# Patient Record
Sex: Male | Born: 1935 | Race: White | Hispanic: No | Marital: Married | Smoking: Former smoker
Health system: Southern US, Community
[De-identification: ages and names within clinical notes are randomized; demographics above are authoritative.]

## PROBLEM LIST (undated history)

## (undated) ENCOUNTER — Emergency Department (HOSPITAL_COMMUNITY): Payer: Medicare Other | Source: Home / Self Care

## (undated) DIAGNOSIS — Z86010 Personal history of colonic polyps: Secondary | ICD-10-CM

## (undated) DIAGNOSIS — E119 Type 2 diabetes mellitus without complications: Secondary | ICD-10-CM

## (undated) DIAGNOSIS — Z860101 Personal history of adenomatous and serrated colon polyps: Secondary | ICD-10-CM

## (undated) DIAGNOSIS — Z955 Presence of coronary angioplasty implant and graft: Secondary | ICD-10-CM

## (undated) DIAGNOSIS — R0600 Dyspnea, unspecified: Secondary | ICD-10-CM

## (undated) DIAGNOSIS — F32A Depression, unspecified: Secondary | ICD-10-CM

## (undated) DIAGNOSIS — H9193 Unspecified hearing loss, bilateral: Secondary | ICD-10-CM

## (undated) DIAGNOSIS — M1990 Unspecified osteoarthritis, unspecified site: Secondary | ICD-10-CM

## (undated) DIAGNOSIS — I10 Essential (primary) hypertension: Secondary | ICD-10-CM

## (undated) DIAGNOSIS — L723 Sebaceous cyst: Secondary | ICD-10-CM

## (undated) DIAGNOSIS — R2681 Unsteadiness on feet: Secondary | ICD-10-CM

## (undated) DIAGNOSIS — I440 Atrioventricular block, first degree: Secondary | ICD-10-CM

## (undated) DIAGNOSIS — G20A1 Parkinson's disease without dyskinesia, without mention of fluctuations: Secondary | ICD-10-CM

## (undated) DIAGNOSIS — G20 Parkinson's disease: Secondary | ICD-10-CM

## (undated) DIAGNOSIS — K449 Diaphragmatic hernia without obstruction or gangrene: Secondary | ICD-10-CM

## (undated) DIAGNOSIS — E785 Hyperlipidemia, unspecified: Secondary | ICD-10-CM

## (undated) DIAGNOSIS — K219 Gastro-esophageal reflux disease without esophagitis: Secondary | ICD-10-CM

## (undated) DIAGNOSIS — R479 Unspecified speech disturbances: Secondary | ICD-10-CM

## (undated) DIAGNOSIS — K5730 Diverticulosis of large intestine without perforation or abscess without bleeding: Secondary | ICD-10-CM

## (undated) DIAGNOSIS — R233 Spontaneous ecchymoses: Secondary | ICD-10-CM

## (undated) DIAGNOSIS — I252 Old myocardial infarction: Secondary | ICD-10-CM

## (undated) DIAGNOSIS — N400 Enlarged prostate without lower urinary tract symptoms: Secondary | ICD-10-CM

## (undated) DIAGNOSIS — I2510 Atherosclerotic heart disease of native coronary artery without angina pectoris: Secondary | ICD-10-CM

## (undated) DIAGNOSIS — I452 Bifascicular block: Secondary | ICD-10-CM

## (undated) DIAGNOSIS — N183 Chronic kidney disease, stage 3 unspecified: Secondary | ICD-10-CM

## (undated) DIAGNOSIS — F329 Major depressive disorder, single episode, unspecified: Secondary | ICD-10-CM

## (undated) DIAGNOSIS — Z973 Presence of spectacles and contact lenses: Secondary | ICD-10-CM

## (undated) DIAGNOSIS — R238 Other skin changes: Secondary | ICD-10-CM

## (undated) DIAGNOSIS — J449 Chronic obstructive pulmonary disease, unspecified: Secondary | ICD-10-CM

## (undated) HISTORY — DX: Gastro-esophageal reflux disease without esophagitis: K21.9

## (undated) HISTORY — PX: OTHER SURGICAL HISTORY: SHX169

## (undated) HISTORY — DX: Hyperlipidemia, unspecified: E78.5

## (undated) HISTORY — PX: CORONARY ANGIOPLASTY WITH STENT PLACEMENT: SHX49

## (undated) HISTORY — PX: CARDIOVASCULAR STRESS TEST: SHX262

## (undated) HISTORY — DX: Atherosclerotic heart disease of native coronary artery without angina pectoris: I25.10

## (undated) HISTORY — DX: Essential (primary) hypertension: I10

## (undated) HISTORY — PX: TONSILLECTOMY: SUR1361

## (undated) HISTORY — PX: CARDIAC CATHETERIZATION: SHX172

## (undated) HISTORY — PX: CATARACT EXTRACTION W/ INTRAOCULAR LENS IMPLANT: SHX1309

## (undated) HISTORY — PX: ESOPHAGOGASTRODUODENOSCOPY ENDOSCOPY: SHX5814

## (undated) HISTORY — DX: Major depressive disorder, single episode, unspecified: F32.9

## (undated) HISTORY — DX: Depression, unspecified: F32.A

## (undated) HISTORY — PX: TRANSTHORACIC ECHOCARDIOGRAM: SHX275

---

## 2000-02-10 ENCOUNTER — Encounter: Payer: Self-pay | Admitting: Emergency Medicine

## 2000-02-10 ENCOUNTER — Inpatient Hospital Stay (HOSPITAL_COMMUNITY): Admission: EM | Admit: 2000-02-10 | Discharge: 2000-02-13 | Payer: Self-pay | Admitting: Emergency Medicine

## 2000-03-01 DIAGNOSIS — I252 Old myocardial infarction: Secondary | ICD-10-CM

## 2000-03-01 HISTORY — DX: Old myocardial infarction: I25.2

## 2001-04-13 ENCOUNTER — Inpatient Hospital Stay (HOSPITAL_COMMUNITY): Admission: EM | Admit: 2001-04-13 | Discharge: 2001-04-15 | Payer: Self-pay

## 2001-04-15 ENCOUNTER — Encounter: Payer: Self-pay | Admitting: Interventional Cardiology

## 2004-08-18 ENCOUNTER — Encounter: Admission: RE | Admit: 2004-08-18 | Discharge: 2004-08-18 | Payer: Self-pay | Admitting: Internal Medicine

## 2005-05-10 ENCOUNTER — Inpatient Hospital Stay (HOSPITAL_BASED_OUTPATIENT_CLINIC_OR_DEPARTMENT_OTHER): Admission: RE | Admit: 2005-05-10 | Discharge: 2005-05-10 | Payer: Self-pay | Admitting: Interventional Cardiology

## 2010-03-06 ENCOUNTER — Encounter: Admission: RE | Admit: 2010-03-06 | Discharge: 2010-03-06 | Payer: Self-pay | Admitting: Internal Medicine

## 2010-06-30 ENCOUNTER — Ambulatory Visit (HOSPITAL_COMMUNITY)
Admission: RE | Admit: 2010-06-30 | Discharge: 2010-06-30 | Payer: Self-pay | Source: Home / Self Care | Attending: General Surgery | Admitting: General Surgery

## 2010-06-30 LAB — DIFFERENTIAL
Basophils Absolute: 0.1 10*3/uL (ref 0.0–0.1)
Basophils Relative: 1 % (ref 0–1)
Eosinophils Absolute: 0.3 10*3/uL (ref 0.0–0.7)
Eosinophils Relative: 4 % (ref 0–5)
Lymphocytes Relative: 24 % (ref 12–46)
Lymphs Abs: 2 10*3/uL (ref 0.7–4.0)
Monocytes Absolute: 0.9 10*3/uL (ref 0.1–1.0)
Monocytes Relative: 11 % (ref 3–12)
Neutro Abs: 4.9 10*3/uL (ref 1.7–7.7)
Neutrophils Relative %: 60 % (ref 43–77)

## 2010-06-30 LAB — CBC
HCT: 44.8 % (ref 39.0–52.0)
MCHC: 33.5 g/dL (ref 30.0–36.0)
RDW: 13.6 % (ref 11.5–15.5)

## 2010-06-30 LAB — SURGICAL PCR SCREEN
MRSA, PCR: NEGATIVE
Staphylococcus aureus: NEGATIVE

## 2010-06-30 LAB — COMPREHENSIVE METABOLIC PANEL
AST: 38 U/L — ABNORMAL HIGH (ref 0–37)
CO2: 27 mEq/L (ref 19–32)
Calcium: 10.2 mg/dL (ref 8.4–10.5)
Creatinine, Ser: 1.84 mg/dL — ABNORMAL HIGH (ref 0.4–1.5)
GFR calc Af Amer: 43 mL/min — ABNORMAL LOW (ref >60)
GFR calc non Af Amer: 36 mL/min — ABNORMAL LOW (ref >60)
Glucose, Bld: 270 mg/dL — ABNORMAL HIGH (ref 70–99)

## 2010-06-30 LAB — URINALYSIS, ROUTINE W REFLEX MICROSCOPIC
Hgb urine dipstick: NEGATIVE
Protein, ur: NEGATIVE mg/dL
Urobilinogen, UA: 0.2 mg/dL (ref 0.0–1.0)

## 2010-07-01 LAB — GLUCOSE, CAPILLARY
Glucose-Capillary: 129 mg/dL — ABNORMAL HIGH (ref 70–99)
Glucose-Capillary: 125 mg/dL — ABNORMAL HIGH (ref 70–99)

## 2010-07-03 NOTE — Op Note (Signed)
NAME:  Ronald Benitez, Ronald Benitez NO.:  [id number]  MEDICAL RECORD NO.:  [id number]          PATIENT TYPE:  AMB  LOCATION:  SDS                          FACILITY:  [facility]  PHYSICIAN:  Angelia Mould. Derrell Lolling, M.D.DATE OF BIRTH:  02/08/1929  DATE OF PROCEDURE:  06/30/2010 DATE OF DISCHARGE:                              OPERATIVE REPORT   PREOPERATIVE DIAGNOSIS:  Right inguinal hernia.  POSTOPERATIVE DIAGNOSIS:  Right inguinal hernia.  OPERATION PERFORMED:  Open repair right inguinal hernia with UltraPro mesh (Lichtenstein repair).  SURGEON:  Angelia Mould. Derrell Lolling, M.D.  OPERATIVE INDICATION:  This is a 77 year old Caucasian gentleman with coronary artery disease, non-insulin-dependent diabetes, hypertension, chronic renal insufficiency, and depressive disorder.  He has had a bulge in his right groin for 8-10 months.  This is getting bigger and it has become uncomfortable.  It has never been incarcerated.  On exam, hehas a right inguinal hernia that is moderate in size, but reducible. There are no other hernias or scrotal masses noted.  It was his desire to go ahead and have this repaired at this time and he was brought to the operating room electively.  OPERATIVE TECHNIQUE:  Following the induction of general endotracheal anesthesia, the patient's lower abdomen, groin and genitalia were prepped and draped in sterile fashion.  Surgical time-out was held identifying correct patient, correct procedure, and correct site. Intravenous antibiotics were given.  0.5% Marcaine with epinephrine was used as a local infiltration anesthetic.  A slightly oblique transverse incision was made in the right groin overlying the right inguinal canal. Dissection was carried down through the subcutaneous tissue, exposing the aponeurosis of the external oblique.  The external oblique was incised in the direction of its fibers, opening up the external inguinal ring.  The external oblique was dissected  off the underlying tissues and self-retaining retractors were placed.  The cord structures were dissected, mobilized, and encircled with Penrose drain.  A small sensory nerve was noted to be intermittently associated with the cord and it was traced back to its emergence from the muscles lateral to the internal ring, clamped, divided, and ligated with 2-0 silk tie to prevent neuroma formation.  The nerve medially was debrided away.  Cremasteric muscle fibers were skeletonized off the cord structures.  I found that he had a large indirect hernia sac.  This was carefully dissected away from the cord structures all the way back to the level of the internal ring.  The indirect sac was then opened and inspected.  It was empty.  I was able to pass my finger through the indirect sac into the peritoneal cavity. There was no evidence of femoral hernia.  There was no ascites.  The indirect sac was then twisted and suture-ligated at the level of the internal ring with a suture ligature of 2-0 silk.  The redundant sac was excised and discarded.  There was also a lipoma associated with the cord, which was dissected off and away.  At this point in time, the dissection appeared complete.  Hemostasis was good and achieved electrocautery.  The wound was irrigated with saline.  The hernia was repaired and reinforced with  an onlay graft of UltraPro mesh.  A 3 inch x 6 inch piece of UltraPro mesh was brought to the operative field and trimmed at the corners to accommodate the wound.  The mesh was sutured in place with running sutures of 2-0 Prolene and an interrupted mattress sutures of 2-0 Prolene.  The mesh was sutured so as to generously overlap the fascia at the pubic tubercle, then along the inguinal ligament inferiorly until it was well lateral to the internal ring. Medially and superiorly and superior medially, I placed several interrupted mattress sutures of 2-0 Prolene.  The mesh was  incised laterally so as to wrap around the cord structures at the internal ring. The tails of the mesh were overlapped laterally.  Both the superior and inferior sutures were tied down and completed.  I put one more mattress suture of 2-0 Prolene in the mesh just lateral to the internal ring to tighten up the repair.  This provided a very secure repair both medial and lateral to the internal ring, but allowed an adequate fingertip opening to the cord structures.  The wound was irrigated with saline. Hemostasis was excellent.  The external oblique was closed with running suture of 2-0 Vicryl placing the cord structures deep to the external oblique.  Scarpa fascia was closed with 3-0 Vicryl sutures and the skin closed with a running subcuticular suture of 4-0 Monocryl and Dermabond. Ice pack was placed.  The patient was taken to the recovery room in stable condition.  Estimated blood loss was about 10 mL.  Complications none.  Sponge, needle, and instrument counts were correct.     Angelia Mould. Derrell Lolling, M.D.     HMI/MEDQ  D:  06/30/2010  T:  06/30/2010  Job:  [id number]  cc:   Theressa Millard, M.D. Lyn Records, M.D. Courtney Paris, M.D.  Electronically Signed by Claud Kelp M.D. on 07/01/2010 10:03:06 AM

## 2010-10-23 NOTE — H&P (Signed)
[place]. [facility]  Patient:    Ronald Benitez, Ronald Benitez Visit Number: [id number] MRN: [id number]          Service Type: MED Location: [phone] Attending Physician:  Lyn Records. Iii Dictated by:   Anselm Lis, N.P. Admit Date:  04/13/2001 Discharge Date: 04/15/2001                           History and Physical  DATE OF BIRTH:  January 25, 1929  PRIMARY CARE [place]:  Dr. Theressa Millard.  IMPRESSION:  (as dictated by Dr. Verdis Prime) 1. Stable, becoming unstable, angina pattern in a 77 year old gentleman.    History of dyslipidemia.  Known small vessel disease by most recent cardiac    catheterization, September 2001.  At that time the patient suffered an    acute myocardial infarction thought secondary to progression of disease    in the RV branch of the RCA.  At time of heart cath, stents that were    previously placed to the RCA, OM #2 (x 2) and proximal CFX were patent.    There was preserved LV function.  At this admission his EKG is nonacutely    ischemic and is essentially unchanged from EKG of June 2002.  The patient    is currently pain-free.  He is on aspirin and beta blocker.  His first set    of heart enzymes are normal. 2. History of depression/anxiety on Zoloft, Ativan, and desipramine. 3. History of dyslipidemia; on Zocor. 4. History of hypertension; metoprolol, hydrochlorothiazide, and Captopril. 5. Renal insufficiency, baseline creatinine as high as 2.0.  Serum creatinine    today 1.7. 6. Hypokalemia with serum potassium at 3.6. 7. Chronic right bundle branch block, left anterior hemiblock.  PLAN:  (As dictated by Dr. Verdis Prime) 1. Admit to telemetry, rule out MI protocol.  Serial cardiac enzymes daily,    EKG.  We would like to proceed with coronary angiography, but schedule may    not allow until Monday, April 17, 2001, and we are not sure if the    patient would agree to stay in hospital until then. 2. Supplement  potassium with 40 mEq of K-Dur. 3. Would need prehydration IV fluids/Mucomyst 10-12 hours prior to anticipated    heart cath because of history of renal insufficiency.  HISTORY OF PRESENT ILLNESS:  [name] is a very pleasant 77 year old gentleman with a history of stents RCA, OM-2 (x 2), proximal CFX, all patent on recent heart cath, September 2001.  He has chronic small vessel disease (70-80% OM-3, 100% of a side branch diagonal 1, 80% other side branch diagonal 1, 100% proximal mid PDA).  At time of cath, September 2001, it was noted there was progression of stenosis, acute marginal branch off RCA, which was thought to be the culprit for patients MI.  He had normal LV function with EF greater than 55%.  It was opted for medical management.  Since that time the patient has had episodic (about every other week) exertional angina symptoms relieved with nitroglycerin and rest.  Most recent episode prior to today was approximately one week ago.  Today while doing light yard work, he developed low substernal pressure/burning described as 8/10, without associated diaphoresis, nausea, or shortness of breath.  He felt hot.  He drove a quarter mile home and took a total of five sublingual nitroglycerin doses (combo of tabs and spray) with perhaps transient  improvement before finally relieved after approximately 55 minutes.  He came to the emergency room at his wifes insistence, and he is currently pain free. His EKG is nonischemic (chronic right bundle branch block).  First set of cardiac enzymes are negative.  PREVIOUS MEDICAL HISTORY: 1. Coronary atherosclerotic heart disease.    a. (September 2001) myocardial infarction with peak CK 419, MB fraction       of 64, troponin I of 1.53.  Cardiac catheterization revealing 100% PDA,       95% acute marginal branch of RCA (question culprit lesion), 100% branch       diagonal 1, high grade obstruction second branch of diagonal 1, 50% mid        LAD, 70-80% OM-3.  All lesions except for ____ marginal branch of       RCA were unchanged from prior heart catheterization in 1998.    b. (March 1998) Stent RCA.    c. (April 1997) Stent OM-2.    d. (November 1997) Stent proximal CFX, stent OM 2.    e. (November 1999) Cardiolite revealing EF of 73%, inferior basal ischemia       (small area) new compared to perfusion scan in June 1998. 2. History of hiatal hernia with prior EGD. 3. Depression/anxiety. 4. Chronic right bundle branch block. 5. History of renal insufficiency with creatinine as high as 2.0, April 1998. 6. Colonic polyps. 7. BPH.  PREVIOUS SURGICAL HISTORY:  Tonsillectomy.  ALLERGIES:  No known drug allergies.  Okay with seafood, shellfish, and with iodinated products.  MEDICATIONS: 1. Zoloft 50 mg p.o. q.a.m. 2. Metoprolol 50 mg one half b.i.d. 3. Hydrochlorothiazide 25 mg p.o. q.d. 4. Zocor 40 mg p.o. q.h.s. 5. Captopril 12.5 one half b.i.d. 6. Lorazepam 0.5 t.i.d. 7. Desipramine 50 mg two tabs q.h.s. 8. Ecotrin 325 mg p.o. q.d.  SOCIAL HISTORY/HABITS:  The patient is married for 51 years.  He has a daughter and three sons, alive and well.  Occasional cigar use.  ETOH: Negative.  FAMILY HISTORY:  Mother deceased at age 99, CAD.  Father deceased at age 92 of CAD.  Brother deceased in his 30s, encephalopathy.  He has two sisters.  REVIEW OF SYSTEMS:  As in HPI/previous medical history, otherwise wears glasses.  He is hard of hearing and needs hearing aids, but does not like to wear them.  Dentition okay.  Notes more frequent dysphagia, thought secondary to hiatal hernia and eating too fast and taking too large of bites.  He does have symptoms of GERD, but can tell the difference between that and anginal symptoms, although both described as burning.  She takes Pepcid p.r.n. for this.  Negative constipation, diarrhea, melena, no bright red blood PR. Negative dysuria or hematuria.  Arthritic type complaints  affecting his knees and the lower back.  Denies orthopnea, PND, or pedal edema.  Denies DOE.  PHYSICAL EXAMINATION: (As performed by Dr. Verdis Prime)   VITAL SIGNS:  Blood pressure 104/58, heart rate 69 and regular, temperature 97.6, respiratory rate 20, room air saturation 96%.  GENERAL:  In general he is a well-nourished, older gentleman in no apparent distress.  His wife is in attendance.  HEENT:  Brisk bilateral carotid upstroke without bruit.  NECK:  Without JVD, no thyromegaly.  CHEST:  Lung sounds clear with equal bilateral excursion.  Negative CPA tenderness.  CARDIAC:  Regular rate and rhythm without murmur, rub, or gallop.  Distant heart sounds.  ABDOMEN:  Soft, nondistended, normal active bowel sounds.  Negative abdominal aorta, renal or femoral bruit.  Nontender to applied pressure, no masses, no organomegaly appreciated.  EXTREMITIES:  Distal pulses intact, negative pedal edema.  NEUROLOGIC:  Cranial nerves 2-12 grossly intact, alert and oriented x 3.  GENITAL/RECTAL:  Exams deferred.  LABORATORY TESTS AND DATA:  His EKG revealed NSR with right bundle branch block, left axis deviation/left anterior hemiblock.  Question left atrial enlargement.  No changes compared with EKG, June 2002.  Sodium 136, K 3.6, chloride 101, CO2 28, BUN 27, creatinine 1.7, glucose 142, calcium 9.2.  LFTs within normal range.  CK 163, MB fraction 2.3, troponin I 0.03.  Hemoglobin 14.6, hematocrit of 42.7, WBC 6.2, platelets 193.  Protime 13.8. INR 1.1, PTT 34.  Chest x-ray was clear; azygous lobe. Dictated by:   Anselm Lis, N.P. Attending Physician:  Lyn Records. Iii DD:  04/13/01 TD:  04/14/01 Job: [id number]

## 2010-10-23 NOTE — Cardiovascular Report (Signed)
NAME:  Ronald Benitez, Ronald Benitez NO.:  [id number]   MEDICAL RECORD NO.:  [id number]          PATIENT TYPE:  OIB   LOCATION:  1965                         FACILITY:  [facility]   PHYSICIAN:  Lyn Records, M.D.   DATE OF BIRTH:  [birth year]   DATE OF PROCEDURE:  05/10/2005  DATE OF DISCHARGE:  05/10/2005                              CARDIAC CATHETERIZATION   INDICATION FOR STUDY:  Increased in intensity/severity of anginal discomfort  with discomfort lasting up to hours at times. The patient has prior history  of circumflex and right coronary stent remotely. Study is being done to  redefine coronary anatomy and help guide therapy in light of his  intensification of anginal symptoms.   The patient also has renal insufficiency and a history of hypertension. We  will do an abdominal aortogram to rule out renal artery stenosis in the  presence of mild renal insufficiency with creatinines in the 1.7 to 1.8  range.   PROCEDURE PERFORMED:  1.  Normal left heart catheterization.  2.  Selective coronary angiography.  3.  Left ventriculography.  4.  Abdominal aortography.   DESCRIPTION:  After informed consent, a 4-French sheath was placed in the  right femoral artery using a modified Seldinger technique. A 4-French A2  multipurpose catheter was then used for hemodynamic recordings, left  ventriculography by hand injection and selective right coronary angiography.  A 4-French #4 left Judkins catheter was used for left coronary angiography.  Left ventriculography was performed by hand injection with the multipurpose  catheter. Following coronary angiography, we then performed abdominal  aortography with a straight pigtail 4-French catheter using 40 cc of  contrast at 26 cc per second. No evidence of abdominal aortic aneurysm or  renal artery stenosis was noted.   Hemostasis was achieved with manual compression.   RESULTS:   HEMODYNAMIC DATA:  1.  Aortic pressure was 36/68.  2.   Left ventricular pressure 135/22.   ABDOMINAL AORTOGRAPHY:  No renal artery stenosis or evidence of abdominal  aortic aneurysm is noted.   LEFT VENTRICULOGRAPHY:  Left ventricle was upper limit of normal in size.  There is mild anterolateral hypokinesis. EF estimated to be 50%.   CORONARY ANGIOGRAPHY:  1.  Left main coronary is calcified noted by cine fluoroscopy. No left main      obstructive lesion was noted.  2.  The left anterior descending coronary: There is a large vessel calcified      proximally. No significant obstructive lesions were noted in the LAD      proper. The LAD wraps around the left ventricular apex. Up to 40-50%      narrowing is noted beyond the second diagonal. The first diagonal is      totally occluded proximally with faint minimal collaterals noted that      supplied the mid-anterolateral wall.  3.  The circumflex artery: Circumflex coronary artery essentially      trifurcates into a continuation circumflex and a large bifurcating first      obtuse marginal. First obtuse marginal that is dominant. The inferior      most branch  contains a Dan-Turkel-Ruben stent that is widely patent. The      stent was positioned within the bifurcation proximally and extends down      into proximal portion of this marginal branch. Beyond the stent, there      are moderate luminal irregularity noted and distally there is an      additional bifurcation. The more superior branch of the bifurcation      contains 70% mid-vessel stenosis at 95% distal stenosis. The ostium that      is involved by the stent in the bifurcation and probably contains      significant stenosis as well. Of the two branches of the first obtuse      marginal, the highly diseased branch was the smaller of the two. The      continuation of the circumflex is diffusely diseased with 80-90%      midvessel narrowing but very small distal territory.  4.  Right coronary:  Right coronary artery contains a stent in  the proximal      and mid-vessel. There is wide patency with up to 30-40% narrowing noted      in the very proximal region of the right coronary with in the proximal      margin of the stent. No high-grade obstruction was felt to be present.      The distal right coronary contains luminal irregularities and the PDA is      severely and diffusely diseased in its proximal and mid-segment with      TIMI grade III flow. The vessel was felt to be too small to intervene      upon. The acute marginal of the right coronary also contains a high-      grade obstruction and is also relatively small vessel.   CONCLUSION:  1.  Severe coronary artery disease with total occlusion of first diagonal,      high-grade obstruction in the acute marginal of the right coronary and      the posterior descending artery of the right coronary and high-grade      obstruction in the more superior branch of the first obtuse marginal      with wide patency of the second larger branch of the first obtuse      marginal. There is severe diffuse disease and continuation of the      circumflex that has a very limited distribution. The main portion of the      left anterior descending artery is widely patent as is the proximal, mid      and distal right coronary.  2.  Mild left ventricular dysfunction with mid anterior wall hypokinesis.  3.  No evidence for abdominal aortic aneurysm or renal artery stenosis.   PLAN:  Continue aggressive medical therapy. Hold Altace and diuretic until  renal function is rechecked in 48-72 hours and also hold Aleve. The patient  will be discharged later today after hydration.      Lyn Records, M.D.  Electronically Signed     HWS/MEDQ  D:  05/10/2005  T:  05/10/2005  Job:  [id number]   cc:   Theressa Millard, M.D.  Fax: [phone]   Georges Lynch. Darrelyn Hillock, M.D.  Fax: [phone]

## 2010-10-23 NOTE — Cardiovascular Report (Signed)
[place]. [facility]  Patient:    Ronald Benitez, Ronald Benitez                   MRN: [id number] Proc. Date: 02/11/00 Adm. Date:  [id number] Attending:  Lyn Records. Iii CC:         James C. Earl Gala, M.D.  Cardiac Catheterization Laboratory   Cardiac Catheterization  INDICATIONS:  Non-Q-wave myocardial infarction.  PROCEDURES PERFORMED: 1. Left heart catheterization. 2. Selective coronary angiography. 3. Left ventriculography.  DESCRIPTION OF PROCEDURE:  After informed consent, a 6 French sheath was started in the right femoral artery using a modified Seldinger technique.  A 6 French A2 multipurpose catheter was used for hemodynamic recordings, hand injection left ventriculography, and selective right coronary angiography.  A 6 French #4 left Judkins catheter was used for left coronary angiography.  The patient tolerated the procedure without complications.  Before we broke scrub I reviewed the patients old cineangiograms to make sure about the presence of any new lesions that could accounted for the patients presenting symptom.  RESULTS:  I:  HEMODYNAMIC DATA:     a. The aortic pressure was 168/84 mmHg.     b. Left ventricular pressure 171/22 mmHg.  II:  LEFT VENTRICULOGRAPHY:  The left ventricle is opacified by hand injection.  Overall contractility is normal.  Estimated ejection fraction is greater than 55%.  No obvious mitral regurgitation was noted.  III:  SELECTIVE CORONARY ANGIOGRAPHY:     a. Left main:  The left main coronary artery is widely patent.     b. Left anterior descending coronary artery:  The left anterior        descending coronary artery is a large vessel that wraps around the        left ventricular apex.  After the first septal perforator there is        an eccentric 40-50% stenosis.  In the mid vessel there is an area        of kinking that obstructs the vessel by 50%.  Both of these sites are        unchanged from previous  studies.  The first diagonal was a large        bifurcating system.  The medial most limb is totally occluded.  It has        chronically totally occluded since 1997 cardiac catheterization.  The        second more lateral branch contains a segmental 85% stenosis that does        not appear to be changed since the last coronary angiogram.     c. Circumflex artery:  The circumflex artery is a large vessel that gives        origin to three obtuse marginal branches.  The proximal vessel in the        distribution of the proximal stent is widely patent.  The first obtuse        marginal contains a segmental 50% stenosis at the site of previous        angioplasty.  The second obtuse marginal branch is widely patent at the        site of previous angioplasty.  The third obtuse marginal branch        contains an ostial 70% stenosis which is unchanged from the previous        study.  There is a Gianturco-Roubin stent in the second obtuse marginal  branch and there appears to be no evidence of in-stent re-stenosis.        The distal portion of the second obtuse marginal branch is widely        patent, although there are luminal irregularities noted.     d. Right coronary artery:  The right coronary artery is a large vessel        that gives origin to a small diffusely diseased PDA that fills        competitively by collaterals from the LAD.  The proximal and mid vessel         is stented and are widely patent.  Distal to the stents in the mid        vessel there is an eccentric 50% stenosis.  There is a bifurcating        relatively large acute marginal branch.  One of the branches after the        bifurcation contains 90-95% stenosis.  This lesion appears to be more        significant when compared to the previous study.  This vessel was        small, however, and is covered.  The ostium to this vessel is covered        by the stent in the mid right coronary and is not  accessible.  CONCLUSIONS: 1. Significant coronary atherosclerotic heart disease with high-grade    functional total occlusion of the posterior descending artery, high-grade    obstruction in a branch of the acute marginal branch of the right coronary,    total occlusion of a branch of the first diagonal with high-grade    obstruction in the second branch of the first diagonal.  A 50% stenosis    in the mid left anterior descending and a 70-80% stenosis in the third    obtuse marginal branch.  All of these sites of stenosis in the branch    vessels are not significantly changed from cardiac catheterization after    angioplasty in 1997 with the exception of the branch in the acute marginal,    which appears to have progressed and may be the culprit for the patients    presentation. 2. Normal left ventricular function.  PLAN:  Medical therapy. DD:  02/11/00 TD:  02/11/00 Job: [id number]

## 2010-10-23 NOTE — Discharge Summary (Signed)
[place]. [facility]  Patient:    Ronald, Benitez Visit Number: [id number] MRN: [id number]          Service Type: MED Location: [phone] Attending Physician:  Lyn Records. Iii Dictated by:   Anselm Lis, N.P. Admit Date:  04/13/2001 Discharge Date: 04/15/2001                             Discharge Summary  PRIMARY CARE PHYSICIAN:  Theressa Millard, M.D.  PROCEDURES:  Adenosine Cardiolite which was negative for ischemia.  Ejection fraction 64%.  Negative scar.  DISCHARGE DIAGNOSES: 1. Acute coronary syndrome with small enzyme leakage, troponin-I 0.26.  Likely    secondary to known small vessel disease.  Adenosine Cardiolite was negative    for ischemia, nor scar, with a good ejection fraction of 64%. 2. History of renal insufficiency with baseline creatinine as high as 2.0. Serum creatinine on admission was 1.7; 1.6 on 04/14/01. 3. History of hypertension on multi-drug regimen with adequate control. 4. History of depression/anxiety, on Zoloft, Ativan, and desipramine. 5. History of dyslipidemia; Zocor. 6. Hypokalemia with serum potassium of 3.6; supplemented. 7. Chronic right bundle branch block/left anterior hemiblock.  PLAN:  The patient is discharged home in stable condition without recurrence of chest pain during course of admission.  DISCHARGE MEDICATIONS: 1. Hydrochlorothiazide 25 mg p.o. q.d. 2. Nitroglycerin tablet or spray 0.4 mg up to 3 doses in 15 minutes. 3. Enteric-coated aspirin 325 mg one q.d. 4. Zoloft 15 mg q.d. 5. Metoprolol 50 mg 1/2 tab p.o. b.i.d. 6. Captopril 12.5 mg 1/2 tab b.i.d. 7. Lorazepam 0.5 mg t.i.d. 8. Desipramine 50 mg two tabs h.s. 9. Zocor 50 mg q.h.s.  ACTIVITY:  Take it easy until after seen by Dr. Katrinka Blazing in clinic.  DIET:  Low fat, low cholesterol, no added salt.  FOLLOWUP:  Dr. Verdis Prime on Monday, April 24, 2001, at 4 p.m.  HISTORY OF PRESENT ILLNESS:  Ronald Benitez is a very pleasant  77 year old gentleman with a history of dyslipidemia and known small vessel disease by most recent cardiac catheterization in September 2001.  At that time, the patient suffered an acute myocardial infarction, thought secondary to progression of disease in the RV branch of the right coronary artery.  At time of heart cath, stents that were previously placed to the right coronary artery, OM#2 (x2), and proximal CFX were patent.  There was preserved left ventricular function.  On this admission, the patients EKG is not acutely ischemic, and is essentially unchanged from EKG of June 2002.  At the time of emergency evaluation he was pain-free and remained so during the course of admission.  The first set of heart enzymes were negative.  He did develop a slight enzyme leak with a troponin-I of 0.26.  With positive troponins, Dr. Katrinka Blazing felt stress (Adenosine) Cardiolite would help determine if one of the proximal coronary arteries was responsible for presentation.  Dr. Katrinka Blazing felt it was possible that some of the smaller branches were culprit (in which case, medical therapy would be most appropriate).  Adenosine Cardiolite revealed no evidence of ischemia nor scar, with an ejection fraction of 64%.  Thus the patient was discharged with continuation of medical therapy.  LABORATORY DATA:  White blood cell count 6.2, hemoglobin 14.6, hematocrit 42.7, platelets 93.  Prothrombin time 13.8, INR 1.1, PTT 34.  Sodium 136, potassium 3.6; supplemented.  Potassium 4.0 on 04/14/01.  CO2  of 28, chloride 101, glucose 142; followup glucose of 114.  BUN 27, creatinine 1.7; followup BUN of 25, creatinine 1.6.  Calcium 9.2.  Liver function tests were within normal limits.  First CK of 164, MB fraction 2.3, troponin-I 0.03 seconds, CK 140, MB fraction 4.5, third CK of 135, MB fraction 4.2, troponin-I 0.26. Followup troponin-I was 0.13.  Adenosine Cardiolite from 04/15/01, revealed ejection fraction of 54% without  regional wall motion abnormality.  No scar nor inducible ischemia.  PAST MEDICAL HISTORY:  As above.  Details of coronary atherosclerotic heart disease:  In 9/01, cardiac catheterization revealing 100% posterior descending coronary artery, 95% acute marginal branch of right coronary artery, question culprit lesion, 100% branch diagonal one, high grade obstruction second branch of diagonal branch, 50% of mid left anterior descending artery, 70 to 80% OM3. All lesions except for the acute marginal branch of the right coronary artery were unchanged from prior heart catheterization in 1998.  The patient did have a myocardial infarction that admission with peak CK of 419, MB fraction 64, troponin-I of 1.53.  In 3/98, stent right coronary artery.  In 4/97, stent OM2.  In 11/97, stent proximal CFX, stent OM2.  In 11/99, Cardiolite revealing ejection fraction 73%, inferior basal ischemia (small area), new compared to perfusion scan in June 1998.  Colonic polyps.  Benign prostatic hypertrophy.  PAST SURGICAL HISTORY:  Tonsillectomy.  TOTAL TIME PREPARING THIS DISCHARGE:  Greater than 45 minutes, including writing out and reviewing discharge instructions with the patient, arranging followup office visit, and dictating this discharge summary. Dictated by:   Anselm Lis, N.P. Attending Physician:  Lyn Records. Iii DD:  05/01/01 TD:  05/01/01 Job: [id number]

## 2010-10-23 NOTE — Discharge Summary (Signed)
. [facility]  Patient:    Ronald Benitez, Ronald Benitez                   MRN: [id number] Adm. Date:  [id number] Disc. Date: [id number] Attending:  Lyn Records. Iii Dictator:   Anselm Lis, N.P. CC:         Winn Jock. Earl Gala, M.D.   Discharge Summary  PRIMARY CARE [place]:  Dr. Theressa Millard  DATE OF BIRTH:  10/03/1928  PROCEDURES: A. (February 11, 2000) Cardiac catheterization revealing significant coronary    atherosclerotic heart disease with 100% of the posterior descending artery,    high-grade obstruction in a branch of the acute marginal branch of the    right coronary artery, 100% of a branch of the first diagonal with    high-grade obstruction in the second branch of the first diagonal.  Also,    50% stenosis mid LAD, 70-80% stenosis in obtuse marginal #3.  All of these    sites not significantly changed from prior cardiac catheterization after    angioplasty in 1997 with the exception of the branch in the acute marginal    which appears to have progressed and may be culprit for patients    presentation.  Normal LV function with EF greater than 55%.  No obvious MR    noted.  Comment:  Before Dr. Katrinka Blazing broke scrub, he reviewed the patients    old cineangiograms to assess for the presence of any new lesions that could    have accounted for the patients presenting symptoms.  DISCHARGE DIAGNOSES/HOSPITAL COURSE:  #1 - CORONARY ARTERY DISEASE:  Ronald Benitez is a 77 year old gentleman with known history of CAD with multiple PTCA/stent interventions of the circumflex.  He was admitted with chest pain consistent with ischemic event. He was started on IV nitroglycerin, Integrilin, heparin and became pain-free shortly after admission.  There were no acute ischemic EKG changes.  He remained pain-free the night of admission and was taken to the CATH lab February 11, 2000 with the following results:  A. LV gram:  Within normal limits with  EF greater than 55%. B. Coronary angiography:  Left main without significant obstruction.  LAD with    with 50% LAD after diagonal #1, 85% (after bifurcation) diagonal #1 in one    branch then branch closed.  Patent proximal CX.  OM1 with 50%, OM2 okay,    OM3 70-85% (all CX/OM branches unchanged since 1997).  RCA with mid 50%    distal to stent.  Chronic 90% PDA.  RV branch 95% (question culprit).    Dr. Lonn Georgia impression was that there was small vessel disease involving    the PDA, RV branch, diagonal #1, and obtuse margin #3 similar to that seen    prior heart CATH 1997.  He suspected the right ventricular branch was the    culprit for the patients presenting symptoms.  IV Integrilin was continued    another 24 hours with discontinuation of IV heparin and nitrates.  Patient    remained pain-free and was discharged home February 13, 2000.  #2 - NON-Q WAVE MYOCARDIAL INFARCTION:  Peak CK of 419 with MB fraction 64.2 and troponin I of 1.53.  #3 - HISTORY OF DYSLIPIDEMIA:  On Zocor.  #4 - HISTORY OF DEPRESSION:  On Zoloft.  #5 - HISTORY OF RIGHT BUNDLE BRANCH BLOCK.  #6 - HISTORY OF RENAL INSUFFICIENCY WITH CREATININE OF 2.0 APRIL 1997: Creatinine 1.8  on this hospital admission.  Patient received Nephron protection from IVP dye exposure with 600 mg Mucomyst two doses prior to CATH as well as pre-CATH IV fluid hydration.  #7 - HISTORY OF HYPERTENSION:  Blood pressure within acceptable range during of this admission on current medical regimen.  DISCHARGE PLANS: 1. Patient discharged home in stable condition. 2. Discharge medications:    A. (NEW) Nitrolingual spray 0.4 mg per spray on or sublingual tongue p.r.n.       chest pain.    B. Captopril 12.5 mg one-half tab p.o. q.d.    C. Zoloft 50 mg p.o. q.d.    D. Zocor 40 mg p.o. q.d.    E. Enteric-coated aspirin 325 mg once daily.    F. Hydrochlorothiazide 25 mg once daily.    G. Metoprolol 50 mg one half q.d.    H. Desipramine 50  mg two tabs at bedtime.    I. Lorazepam 0.5 mg t.i.d. 3. Diet:  Low fat, low cholesterol. 4. Activity:  Patient instructed not to drive until following Wednesday. 5. Followup:  Dr. Verdis Prime; our clinic will call him with a date for    follow-up office visit.  LABORATORY TESTS AND DATA:  Serial cardiac enzymes were as follows:  First CK 172 with MB fraction 2.5 and troponin I of 0.05.  Second CK of 326 with MB fraction of 41 and troponin I of 0.67.  Third CK of 419 with MB fraction 64.5 and troponin I of 1.53.  Admission sodium 136, potassium 3.6, chloride 104, CO2 of 24, glucose 164, BUN 25, creatinine 1.8.  LFTs within normal range though AST very slightly elevated at 41.  Admission coagulopathies were within normal range.  CBC: Wbcs 7.3; 10.8 about time of discharge.  Hemoglobin 15.4; 14.2 at time of discharge.  Admission hematocrit of 44, platelets of 227.  Chest x-ray revealed bilateral atelectasis with a more focal airspace opacity at the left base which may represent early consolidation (pneumonia).  EKG revealed an old right bundle branch block with T wave abnormalities in lead 3 otherwise no significant change.  PAST MEDICAL HISTORY: 1. Coronary atherosclerotic heart disease:    A. (March 1998) Stent to the RCA.    B. (April 1997) Stent to the OM2.    C. (November 1997) Stent in the proximal CX, stent in OM2, PTCA.  Residual       100% of a branch in the diagonal #1, 80% of the other branch diagonal       #1, residual 60 to 70% diagonal #1, and 99% proximal to mid PDA.    D. (November 1999) Cardiolite revealed an ejection fraction of 73%.       Inferior basal ischemia (small area) new compared to perfusion scan in       June 1998. 2. History of hiatal hernia with prior EGD. 3. History of depression and anxiety. 4. Chronic right bundle branch block. 5. History of renal insufficiency with creatinine as high as 2.0 in    April 1998. 6. Colonic polyps. 7. BPH. 8. History  of tonsillectomy. DD:  03/10/00 TD:  03/11/00 Job: [id number]  [id number]

## 2010-10-23 NOTE — H&P (Signed)
[place]. [facility]  Patient:    Ronald Benitez, Ronald Benitez                   MRN: [id number] Adm. Date:  [id number] Attending:  Lyn Records. Iii Dictator:   Anselm Lis, N.P. CC:         Winn Jock. Earl Gala, M.D.   History and Physical  DATE OF BIRTH:  20-Jul-1928  HISTORY OF PRESENT ILLNESS:  Mr. Soulier is a very pleasant 77 year old with a known history of coronary artery disease, last cardiac catheterization in November 1997, at which time he had a stent placed in the proximal circumflex, stent in the OM-II, (restenosis in prior stent area), PTCA of OM-I.  The prior stent placed in March 1997, was patent.  The patient has known residual 99% proximal to mid-PDA, 60%-70% proximal diagonal, 100% of a side branch off of the diagonal-I, and 80% of the other side branch also of diagonal-I.  Normal LV function.  A patient with a history of hypertension and dyslipidemia. Follow-up Cardiolite in November 1999, revealed preserved LV function with an ejection fraction of 73%, inferobasal ischemia which was new compared to June 1998, Cardiolite.  At that time the decision was made for watchful waiting, given the small area of ischemic region and absence of clinical symptoms.  The patient has had a recent history of exertional chest tightness when he walks, relieved with rest.  This morning about 11:30 a.m. he developed a severe substernal chest pain/pressure, question episodic radiation to the left neck, with some back discomfort.  He denies associated shortness of breath, nausea, or diaphoresis.  He took two sublingual nitroglycerin without effect. The discomfort waxes and wanes in intensity.  He reported to [facility] where he was given one sublingual nitroglycerin with a drop in his systolic blood pressure to the 80s and associated diaphoresis.  No significant change in his pain.  Serial electrocardiograms showed the  development of T-wave abnormalities in lead III, but this was essentially unchanged from baseline.  He had the presence of a chronic right bundle branch block.  The patient was given a GI cocktail, four baby aspirin, started on IV nitrates, and IV heparin.  His chest pain did decrease to 1/10 on the pain scale.  He is ordered for IV Integrilin.  CARDIAC RISK FACTORS:  Known coronary artery disease, age, male sex, dyslipidemia, hypertension.  PAST MEDICAL HISTORY: 1. Coronary atherosclerotic heart disease:    a. In March 1998, stent to the RCA.    b. In April 1997, stent to the OM-II.    c. In November 1997, stent in proximal circumflex, stent in OM-II,       PTCA.  Residual 100% of a branch of the diagonal-I, 80% of the       other branch of diagonal-I, residual 60%-70% diagonal-I, a 99%       proximal to mid-PDA.    d. In November 1999, Cardiolyte revealing an ejection fraction of 73%.       Inferobasal ischemia (small area), new compared to perfusion scan       in June 1998. 2. History of hiatal hernia with prior EGD. 3. Depression/anxiety. 4. Chronic right bundle branch block. 5. History of renal insufficiency with creatinine as high as 2.0 in    April 1998. 6. Colonic polyps. 7. Benign prostatic hypertrophy.  PAST SURGICAL HISTORY:  Tonsillectomy.  ALLERGIES:  No known drug allergies.  Okay with sea food and shell fish.  CURRENT MEDICATIONS: 1. Captopril 12.5 mg 1/2 p.o. q.d. 2. Zoloft 50 mg p.o. q.d. 3. Zocor 40 mg p.o. q.d. 4. Enteric-coated aspirin 325 mg p.o. q.h.s. 5. Hydrochlorothiazide 25 mg p.o. q.d. 6. Metoprolol 50 mg 1/2 p.o. q.d. 7. Desipramine 50 mg two tablets q.h.s. 8. Lorazepam 0.5 mg p.o. t.i.d.  SOCIAL HISTORY/HABITS:  The patient is married for 50 years.  Has a daughter and three sons alive and well.  Occasional cigar use.  ETOH:  Negative.  FAMILY HISTORY:  Mother deceased at age 36, CAD.  Father deceased at age 27, CAD.  A brother deceased in  his 51s of heart disease.  He has two sisters.  REVIEW OF SYSTEMS:  As in the HPI and past medical history, otherwise essentially all systems reviewed and were benign.  PHYSICAL EXAMINATION:  VITAL SIGNS:  Blood pressure 128/74, heart rate 82 and regular, respirations 16, temperature 96.7 degrees.  GENERAL:  He is well-nourished older gentleman, in variable levels of distress, as his pain waxes and wanes.   The discomfort seems to sometimes improve with leaning forward, and slight improvement with belching.  HEENT/NECK:  Brisk bilateral carotid upstrokes, without bruit.  No significant jugular venous distention.  CHEST:  Lung sounds clear.  Equal bilateral excursion.  Negative CPA tenderness.  CARDIAC:  A regular rate and rhythm, without murmur, rub, or gallop.  Normal S1, S2.  ABDOMEN:  Soft, nondistended.  Normoactive bowel sounds.  Negative abdominal aortic, renal, or femoral bruits.  Nontender to palpation.  Negative masses or organomegaly appreciated.  EXTREMITIES:  With +2/4 bilateral radial, femoral, dorsalis pedis, posterior tibial pulses.  Negative pedal edema.  NEUROLOGIC:  Cranial nerves II-XII grossly intact.  Alert and oriented x 3.  GENITOURINARY:  Deferred.  RECTAL:  Deferred.  LABORATORY DATA:  The wbcs 7.3, hemoglobin 15.4, hematocrit 44, platelets 227.  Pro time 13.1, INR 1, PTT 28.  Sodium 136, K of 3.6, chloride 104, CO2 of 24, BUN 25, creatinine 1.8, glucose 164.  CPK 172, MB fraction 2.5, troponin I 0.05.  Chest x-ray revealed bibasilar atelectasis, focal opacity in the left base, question early pneumonia.  IMPRESSION: 1. Symptoms of unstable angina pectoris in a 77 year old male with known    history of coronary artery disease, with prior stent to obtuse marginal-II,    stent in the proximal circumflex, percutaneous transluminal coronary    angioplasty of obtuse marginal-I, and patent right coronary artery by    cardiac catheterization in  November 1997.  (Prior stent of right coronary     artery in March 1977.  The patient has known residual diagonal disease,    with 100% branch of the diagonal-I, 80% in another branch of diagonal-I,    and 60%-70% proximal diagonal.  At the time of the catheterization in    November 1997, he also showed a 99% proximal to mid-posterior descending    coronary artery.  Follow-up Cardiolite in November 1999, revealed an    ejection fraction of 73% with inferior basal ischemia, new compared to    perfusion scan in June 1998.  The patient was asymptomatic at the time    of that latest perfusion scan, and it was elected for watchful waiting.    At present, his discomfort waxes and wanes.  He is currently on intravenous    nitrates, intravenous heparin, and is status post four baby aspirin.  There    was no significant diminishing discomfort with sublingual  nitrates.  His    electrocardiogram revealed an old right bundle branch block with some    progressive T-wave abnormalities in lead III, otherwise no significant    change.  He is ordered for intravenous Integrilin.  His first CPK/MB and    troponin are negative. 2. History of hiatal hernia. 3. History of depression/anxiety, on chronic benzodiazepines and Zoloft. 4. History of dyslipidemia, on Zocor. 5. History of hypertension, good control on current medical regimen.  PLAN:  On admission he remains with pain.  We will plan on an early cardiac catheterization this afternoon.  If he becomes pain-free, we will initiate heart catheterization tomorrow morning.  Rule out myocardial infarction with serial cardiac enzymes, daily electrocardiograms. DD:  02/10/00 TD:  02/10/00 Job: [id number]

## 2011-11-19 ENCOUNTER — Other Ambulatory Visit: Payer: Self-pay | Admitting: Orthopaedic Surgery

## 2011-11-19 DIAGNOSIS — M542 Cervicalgia: Secondary | ICD-10-CM

## 2011-11-24 ENCOUNTER — Ambulatory Visit
Admission: RE | Admit: 2011-11-24 | Discharge: 2011-11-24 | Disposition: A | Discharge status: Home / Self Care | Payer: Medicare Other | Source: Ambulatory Visit | Attending: Orthopaedic Surgery | Admitting: Orthopaedic Surgery

## 2011-11-24 DIAGNOSIS — M542 Cervicalgia: Secondary | ICD-10-CM

## 2013-05-08 ENCOUNTER — Encounter: Payer: Self-pay | Admitting: Neurology

## 2013-05-08 ENCOUNTER — Ambulatory Visit (INDEPENDENT_AMBULATORY_CARE_PROVIDER_SITE_OTHER): Payer: Medicare Other | Admitting: Neurology

## 2013-05-08 VITALS — BP 142/62 | HR 48 | Temp 97.4°F | Resp 12 | Ht 71.0 in | Wt 182.6 lb

## 2013-05-08 DIAGNOSIS — F329 Major depressive disorder, single episode, unspecified: Secondary | ICD-10-CM | POA: Insufficient documentation

## 2013-05-08 DIAGNOSIS — E1142 Type 2 diabetes mellitus with diabetic polyneuropathy: Secondary | ICD-10-CM

## 2013-05-08 DIAGNOSIS — E1149 Type 2 diabetes mellitus with other diabetic neurological complication: Secondary | ICD-10-CM

## 2013-05-08 DIAGNOSIS — G20 Parkinson's disease: Secondary | ICD-10-CM

## 2013-05-08 MED ORDER — CARBIDOPA-LEVODOPA 25-100 MG PO TABS: 1.0000 | ORAL_TABLET | Freq: Three times a day (TID) | ORAL | Status: DC

## 2013-05-08 NOTE — Addendum Note (Signed)
Addended by: Sadie Haber D on: 05/08/2013 12:14 PM   Modules accepted: Orders

## 2013-05-08 NOTE — Progress Notes (Signed)
Ronald Benitez was seen today in the movement disorders clinic for neurologic consultation at the request of Darnelle Bos, MD.  The consultation is for the evaluation of possible PD.  The pt was at his PCP office for a CPE and the physician noted tremor that had not previously been present.  The pt noted tremor x 1 month in the L hand at rest.   Specific Symptoms:  Tremor: yes Voice: no change Sleep: sleeps well  Vivid Dreams:  no  Acting out dreams:  no Wet Pillows: no Postural symptoms:  yes, poor balance x 1 year  Falls?  yes, last fall about 3 weeks ago.  Often when bending over (to pick up sticks in car) Bradykinesia symptoms: difficulty getting out of a chair, difficulty regaining balance and describes festinating gait Loss of smell:  no Loss of taste:  no Urinary Incontinence:  no Difficulty Swallowing:  no (better after stretched esophagus) Handwriting, micrographia: yes Trouble with ADL's:  no (has to sit on bed to put on pants)  Trouble buttoning clothing: yes Depression:  no Memory changes:  yes (fairly minor) Hallucinations:  no  visual distortions: no N/V:  no Lightheaded:  no  Syncope: no Diplopia:  no Dyskinesia:  no   PREVIOUS MEDICATIONS: none to date  ALLERGIES:  No Known Allergies  CURRENT MEDICATIONS:    Medication List       This list is accurate as of: 05/08/13 11:53 AM.  Always use your most recent med list.               aspirin 325 MG EC tablet  Take 325 mg by mouth daily.     B-complex with vitamin C tablet  Take 1 tablet by mouth daily.     captopril 25 MG tablet  Commonly known as:  CAPOTEN  Take 25 mg by mouth 2 (two) times daily.     carbidopa-levodopa 25-100 MG per tablet  Commonly known as:  SINEMET IR  Take 1 tablet by mouth 3 (three) times daily.     desipramine 50 MG tablet  Commonly known as:  NORPRAMIN  Take 50 mg by mouth daily.     glimepiride 1 MG tablet  Commonly known as:  AMARYL  Take 1 mg  by mouth daily with breakfast.     LORazepam 0.5 MG tablet  Commonly known as:  ATIVAN  Take 0.5 mg by mouth every 8 (eight) hours.     metFORMIN 500 MG tablet  Commonly known as:  GLUCOPHAGE  Take 500 mg by mouth 2 (two) times daily with a meal.     metoprolol 50 MG tablet  Commonly known as:  LOPRESSOR  Take 50 mg by mouth 2 (two) times daily.     simvastatin 80 MG tablet  Commonly known as:  ZOCOR  Take 80 mg by mouth daily.     vitamin E 100 UNIT capsule  Take by mouth daily.         PAST MEDICAL HISTORY:   Past Medical History  Diagnosis Date  . HTN (hypertension)   . Hyperlipidemia   . DM (diabetes mellitus)   . GERD (gastroesophageal reflux disease)   . CAD (coronary artery disease)     PAST SURGICAL HISTORY:   Past Surgical History  Procedure Laterality Date  . Angioplasty  1997  . Esophagogastroduodenoscopy endoscopy      SOCIAL HISTORY:   History   Social History  . Marital Status: Married  Spouse Name: N/A    Number of Children: N/A  . Years of Education: N/A   Occupational History  . retired    Social History Main Topics  . Smoking status: Former Games developer  . Smokeless tobacco: Never Used     Comment: quit cigarettes 1985, quit cigars 04/2013  . Alcohol Use: No  . Drug Use: Not on file  . Sexual Activity: Not on file   Other Topics Concern  . Not on file   Social History Narrative  . No narrative on file    FAMILY HISTORY:   Family Status  Relation Status Death Age  . Mother Deceased     Heart Attack  . Father Deceased     Heart Problems  . Sister Alive     2, Lung Cancer  . Brother Deceased     Encephalitis  . Son Alive     3, Diabetes  . Daughter Alive     ROS:  A complete 10 system review of systems was obtained and was unremarkable apart from what is mentioned above.  PHYSICAL EXAMINATION:    VITALS:   Filed Vitals:   05/08/13 1054  BP: 142/62  Pulse: 48  Temp: 97.4 F (36.3 C)  Resp: 12  Height: 5\' 11"   (1.803 m)  Weight: 182 lb 9.6 oz (82.827 kg)    GEN:  The patient appears stated age and is in NAD. HEENT:  Normocephalic, atraumatic.  The mucous membranes are dry. The superficial temporal arteries are without ropiness or tenderness. CV:  RRR Lungs:  CTAB Neck/HEME:  There are no carotid bruits bilaterally.  Neurological examination:  Orientation: The patient is alert and oriented x3. Fund of knowledge is appropriate.  Recent and remote memory are intact.  Attention and concentration are normal.    Able to name objects and repeat phrases. Cranial nerves: There is good facial symmetry. Pupils are equal round and reactive to light bilaterally. Fundoscopic exam reveals clear margins bilaterally. Extraocular muscles are intact. The visual fields are full to confrontational testing. The speech is fluent and clear. Soft palate rises symmetrically and there is no tongue deviation. Hearing is decreased to conversational tone. Sensation: Sensation is intact to light and pinprick throughout (facial, trunk, extremities). Vibration is absent at the bilateral big toe and decreased at the ankle. There is no extinction with double simultaneous stimulation. There is no sensory dermatomal level identified. Motor: Strength is 5/5 in the bilateral upper and lower extremities.   Shoulder shrug is equal and symmetric.  There is no pronator drift. Deep tendon reflexes: Deep tendon reflexes are 0/4 at the bilateral biceps, triceps, brachioradialis, patella and achilles. Plantar responses are downgoing bilaterally.  Movement examination: Tone: There is increased tone in the right upper extremity, overall mild to mod.  In the LUE, there is just mild rigid.  The tone in the lower extremities is normal.  Abnormal movements: There is a LUE resting tremor that increases with distraction (naming months backward) Coordination:  There is mild decremation with RAM's, seen mostly in the LUE Gait and Station: The patient has  difficulty arising out of a deep-seated chair without the use of the hands. The patient's stride length is decreased.  He leans to the R.  He turns en bloc.      ASSESSMENT/PLAN:  1.  Parkinsonism.  I suspect that this does represent idiopathic Parkinson's disease.  The patient has tremor, bradykinesia, rigidity and postural instability.  -We discussed the diagnosis as well as pathophysiology  of the disease.  We discussed treatment options as well as prognostic indicators.  Patient education was provided.  -Greater than 50% of the 60 minute visit was spent in counseling answering questions and talking about what to expect now as well as in the future.  We talked about medication options as well as potential future surgical options.  We talked about safety in the home.  -We decided to add carbidopa/levodopa 25/100.  1/2 tab tid x 1 wk, then 1/2 in am & noon & 1 at night for a week, then 1/2 in am &1 at noon &night for a week, then 1 po tid.  Risks, benefits, side effects and alternative therapies were discussed.  The opportunity to ask questions was given and they were answered to the best of my ability.  The patient expressed understanding and willingness to follow the outlined treatment protocols.  -I will refer the patient to the Parkinson's program [facility], for PT/OT and ST.  We talked about the importance of cardiovascular exercise in Parkinson's disease.  -I am concerned that the desipramine may be contributing to falls and this may need addressed in the future.   2.  Falls  -This is likely due to a combination of diabetic PN and parkinsonism.  Again, the desipramine may be contributing as well.    -I am setting him up for PT, as above. 3.  I will see him back in the next 8 weeks, sooner should new neuro issues arise.

## 2013-05-08 NOTE — Patient Instructions (Signed)
1.  We will send a referral to the neurorehab center 2.  Start carbidopa/levodopa 25/100:  1/2 tab three times a day before meals x 1 wk, then 1/2 in am & noon & 1 in evening for a week, then 1/2 in am &1 at noon &one in evening for a week, then 1 tablet three times a day before meals 3.  Follow up 2 months

## 2013-05-22 ENCOUNTER — Ambulatory Visit: Payer: Medicare Other | Attending: Neurology | Admitting: Physical Therapy

## 2013-05-22 ENCOUNTER — Ambulatory Visit: Payer: Medicare Other | Admitting: Occupational Therapy

## 2013-05-22 DIAGNOSIS — G20A1 Parkinson's disease without dyskinesia, without mention of fluctuations: Secondary | ICD-10-CM | POA: Insufficient documentation

## 2013-05-22 DIAGNOSIS — M2420 Disorder of ligament, unspecified site: Secondary | ICD-10-CM | POA: Insufficient documentation

## 2013-05-22 DIAGNOSIS — R279 Unspecified lack of coordination: Secondary | ICD-10-CM | POA: Insufficient documentation

## 2013-05-22 DIAGNOSIS — IMO0001 Reserved for inherently not codable concepts without codable children: Secondary | ICD-10-CM | POA: Insufficient documentation

## 2013-05-22 DIAGNOSIS — M629 Disorder of muscle, unspecified: Secondary | ICD-10-CM | POA: Insufficient documentation

## 2013-05-22 DIAGNOSIS — G20 Parkinson's disease: Secondary | ICD-10-CM | POA: Insufficient documentation

## 2013-05-23 ENCOUNTER — Ambulatory Visit: Payer: Medicare Other | Admitting: Physical Therapy

## 2013-05-27 ENCOUNTER — Emergency Department (HOSPITAL_COMMUNITY)
Admission: EM | Admit: 2013-05-27 | Discharge: 2013-05-27 | Disposition: A | Discharge status: Home / Self Care | Payer: Medicare Other | Attending: Emergency Medicine | Admitting: Emergency Medicine

## 2013-05-27 ENCOUNTER — Encounter (HOSPITAL_COMMUNITY): Payer: Self-pay | Admitting: Emergency Medicine

## 2013-05-27 ENCOUNTER — Emergency Department (HOSPITAL_COMMUNITY): Payer: Medicare Other

## 2013-05-27 DIAGNOSIS — Z79899 Other long term (current) drug therapy: Secondary | ICD-10-CM | POA: Insufficient documentation

## 2013-05-27 DIAGNOSIS — R059 Cough, unspecified: Secondary | ICD-10-CM | POA: Insufficient documentation

## 2013-05-27 DIAGNOSIS — I2510 Atherosclerotic heart disease of native coronary artery without angina pectoris: Secondary | ICD-10-CM | POA: Insufficient documentation

## 2013-05-27 DIAGNOSIS — N189 Chronic kidney disease, unspecified: Secondary | ICD-10-CM | POA: Insufficient documentation

## 2013-05-27 DIAGNOSIS — R509 Fever, unspecified: Secondary | ICD-10-CM

## 2013-05-27 DIAGNOSIS — Z87891 Personal history of nicotine dependence: Secondary | ICD-10-CM | POA: Insufficient documentation

## 2013-05-27 DIAGNOSIS — R05 Cough: Secondary | ICD-10-CM

## 2013-05-27 DIAGNOSIS — E119 Type 2 diabetes mellitus without complications: Secondary | ICD-10-CM | POA: Insufficient documentation

## 2013-05-27 DIAGNOSIS — E785 Hyperlipidemia, unspecified: Secondary | ICD-10-CM | POA: Insufficient documentation

## 2013-05-27 DIAGNOSIS — K219 Gastro-esophageal reflux disease without esophagitis: Secondary | ICD-10-CM | POA: Insufficient documentation

## 2013-05-27 DIAGNOSIS — B9789 Other viral agents as the cause of diseases classified elsewhere: Secondary | ICD-10-CM | POA: Insufficient documentation

## 2013-05-27 DIAGNOSIS — B349 Viral infection, unspecified: Secondary | ICD-10-CM

## 2013-05-27 DIAGNOSIS — I129 Hypertensive chronic kidney disease with stage 1 through stage 4 chronic kidney disease, or unspecified chronic kidney disease: Secondary | ICD-10-CM | POA: Insufficient documentation

## 2013-05-27 DIAGNOSIS — F329 Major depressive disorder, single episode, unspecified: Secondary | ICD-10-CM | POA: Insufficient documentation

## 2013-05-27 DIAGNOSIS — Z7982 Long term (current) use of aspirin: Secondary | ICD-10-CM | POA: Insufficient documentation

## 2013-05-27 DIAGNOSIS — F3289 Other specified depressive episodes: Secondary | ICD-10-CM | POA: Insufficient documentation

## 2013-05-27 LAB — URINALYSIS, ROUTINE W REFLEX MICROSCOPIC
Bilirubin Urine: NEGATIVE
Glucose, UA: NEGATIVE mg/dL
Ketones, ur: NEGATIVE mg/dL
Specific Gravity, Urine: 1.02 (ref 1.005–1.030)
pH: 5.5 (ref 5.0–8.0)

## 2013-05-27 LAB — COMPREHENSIVE METABOLIC PANEL
ALT: 13 U/L (ref 0–53)
Albumin: 3.6 g/dL (ref 3.5–5.2)
Alkaline Phosphatase: 63 U/L (ref 39–117)
Potassium: 4.4 mEq/L (ref 3.5–5.1)
Sodium: 137 mEq/L (ref 135–145)
Total Protein: 7 g/dL (ref 6.0–8.3)

## 2013-05-27 LAB — URINE MICROSCOPIC-ADD ON

## 2013-05-27 LAB — TROPONIN I: Troponin I: <0.3 ng/mL (ref <0.30)

## 2013-05-27 LAB — CBC
MCHC: 34.1 g/dL (ref 30.0–36.0)
Platelets: 194 10*3/uL (ref 150–400)
RDW: 13.6 % (ref 11.5–15.5)

## 2013-05-27 MED ORDER — ACETAMINOPHEN 325 MG PO TABS
650.0000 mg | ORAL_TABLET | Freq: Once | ORAL | Status: AC
  Administered 2013-05-27: 650 mg via ORAL
  Filled 2013-05-27: qty 2

## 2013-05-27 MED ORDER — SODIUM CHLORIDE 0.9 % IV SOLN
INTRAVENOUS | Status: DC
  Administered 2013-05-27: 11:00:00 via INTRAVENOUS

## 2013-05-27 MED ORDER — SODIUM CHLORIDE 0.9 % IV BOLUS (SEPSIS)
500.0000 mL | Freq: Once | INTRAVENOUS | Status: AC
  Administered 2013-05-27: 500 mL via INTRAVENOUS

## 2013-05-27 MED ORDER — OSELTAMIVIR PHOSPHATE 75 MG PO CAPS: 75.0000 mg | ORAL_CAPSULE | Freq: Two times a day (BID) | ORAL | Status: DC

## 2013-05-27 NOTE — ED Notes (Signed)
Bed: YN82 Expected date: 05/27/13 Expected time: 10:34 AM Means of arrival: Ambulance Comments: Cough, fever treated with Tylenol

## 2013-05-27 NOTE — ED Notes (Addendum)
Per EMS: pt c/o fever and cough x 1 day . Pt had cold for about 2 weeks was getting better but fever and cough has gotten worse. Gave 1000 mg of tylenol in route 1020. C/o chest pain with deep breathe and cough.

## 2013-05-27 NOTE — ED Notes (Signed)
Pt given urinal and made aware of need for UA 

## 2013-05-27 NOTE — ED Provider Notes (Addendum)
CSN: [id number]     Arrival date & time 05/27/13  1032 History   First MD Initiated Contact with Patient 05/27/13 1035     Chief Complaint  Patient presents with  . Fever  . Cough   (Consider location/radiation/quality/duration/timing/severity/associated sxs/prior Treatment) Patient is a 79 y.o. male presenting with fever and cough. The history is provided by the patient and the EMS personnel.  Fever Associated symptoms: cough   Associated symptoms: no chest pain, no diarrhea, no dysuria, no headaches, no rash, no sore throat and no vomiting   Cough Associated symptoms: fever   Associated symptoms: no chest pain, no headaches, no rash, no shortness of breath and no sore throat   pt from home via ems w fever 102, and recent non productive cough. Cough episodic, no specific exacerbating or alleviating factors.  Symptoms present for the past couple days.  Pt very difficult historian.  Denies sore throat or runny nose. No body aches. Denies chest pain. No sob. No abd pain. No nvd. No gu c/o. No rash. No known ill contacts.   Recent dx parkinsons.   Past Medical History  Diagnosis Date  . HTN (hypertension)   . Hyperlipidemia   . DM (diabetes mellitus)   . GERD (gastroesophageal reflux disease)   . CAD (coronary artery disease)   . Depression     meds since 1986  . Chronic kidney disease    Past Surgical History  Procedure Laterality Date  . Angioplasty  1997  . Esophagogastroduodenoscopy endoscopy     No family history on file. History  Substance Use Topics  . Smoking status: Former Games developer  . Smokeless tobacco: Never Used     Comment: quit cigarettes 1985, quit cigars 04/2013  . Alcohol Use: No    Review of Systems  Constitutional: Positive for fever.  HENT: Negative for sore throat.   Eyes: Negative for redness.  Respiratory: Positive for cough. Negative for shortness of breath.   Cardiovascular: Negative for chest pain and leg swelling.  Gastrointestinal: Negative for  vomiting, abdominal pain and diarrhea.  Genitourinary: Negative for dysuria and flank pain.  Musculoskeletal: Negative for back pain and neck pain.  Skin: Negative for rash.  Neurological: Negative for headaches.  Hematological: Does not bruise/bleed easily.  Psychiatric/Behavioral: Negative for agitation.    Allergies  Review of patient's allergies indicates no known allergies.  Home Medications   Current Outpatient Rx  Name  Route  Sig  Dispense  Refill  . aspirin 325 MG EC tablet   Oral   Take 325 mg by mouth daily.         . B Complex-C (B-COMPLEX WITH VITAMIN C) tablet   Oral   Take 1 tablet by mouth daily.         . captopril (CAPOTEN) 25 MG tablet   Oral   Take 25 mg by mouth 2 (two) times daily.         . carbidopa-levodopa (SINEMET IR) 25-100 MG per tablet   Oral   Take 1 tablet by mouth 3 (three) times daily.   90 tablet   4   . desipramine (NORPRAMIN) 50 MG tablet   Oral   Take 50 mg by mouth daily.         Marland Kitchen glimepiride (AMARYL) 1 MG tablet   Oral   Take 1 mg by mouth daily with breakfast.         . LORazepam (ATIVAN) 0.5 MG tablet   Oral   Take  0.5 mg by mouth every 8 (eight) hours.         . metFORMIN (GLUCOPHAGE) 500 MG tablet   Oral   Take 500 mg by mouth 2 (two) times daily with a meal.         . metoprolol (LOPRESSOR) 50 MG tablet   Oral   Take 50 mg by mouth 2 (two) times daily.         . simvastatin (ZOCOR) 80 MG tablet   Oral   Take 80 mg by mouth daily.         . vitamin E 100 UNIT capsule   Oral   Take by mouth daily.          BP 136/57  Pulse 85  Temp(Src) 99.2 F (37.3 C) (Oral)  Resp 16  SpO2 96% Physical Exam  Nursing note and vitals reviewed. Constitutional: He appears well-developed and well-nourished. No distress.  HENT:  Mouth/Throat: Oropharynx is clear and moist.  Eyes: Conjunctivae are normal. No scleral icterus.  Neck: Normal range of motion. Neck supple. No tracheal deviation present.   No stiffness/rigidity  Cardiovascular: Normal rate, regular rhythm, normal heart sounds and intact distal pulses.   Pulmonary/Chest: Effort normal. No accessory muscle usage. No respiratory distress.  Abdominal: Soft. Bowel sounds are normal. He exhibits no distension and no mass. There is no tenderness. There is no rebound and no guarding.  Genitourinary:  No cva tenderness  Musculoskeletal: Normal range of motion. He exhibits no edema and no tenderness.  Lymphadenopathy:    He has no cervical adenopathy.  Neurological: He is alert.  Awake and alert. Responds to questions. Purposeful movement bil extremities.   Skin: Skin is warm and dry. No rash noted. He is not diaphoretic.  Psychiatric: He has a normal mood and affect.    ED Course  Procedures (including critical care time)  Results for orders placed during the hospital encounter of 05/27/13  CBC      Result Value Range   WBC 8.0  4.0 - 10.5 K/uL   RBC 3.96 (*) 4.22 - 5.81 MIL/uL   Hemoglobin 12.5 (*) 13.0 - 17.0 g/dL   HCT 47.8 (*) 29.5 - 62.1 %   MCV 92.7  78.0 - 100.0 fL   MCH 31.6  26.0 - 34.0 pg   MCHC 34.1  30.0 - 36.0 g/dL   RDW 30.8  65.7 - 84.6 %   Platelets 194  150 - 400 K/uL  COMPREHENSIVE METABOLIC PANEL      Result Value Range   Sodium 137  135 - 145 mEq/L   Potassium 4.4  3.5 - 5.1 mEq/L   Chloride 102  96 - 112 mEq/L   CO2 21  19 - 32 mEq/L   Glucose, Bld 129 (*) 70 - 99 mg/dL   BUN 26 (*) 6 - 23 mg/dL   Creatinine, Ser 9.62 (*) 0.50 - 1.35 mg/dL   Calcium 9.9  8.4 - 95.2 mg/dL   Total Protein 7.0  6.0 - 8.3 g/dL   Albumin 3.6  3.5 - 5.2 g/dL   AST 26  0 - 37 U/L   ALT 13  0 - 53 U/L   Alkaline Phosphatase 63  39 - 117 U/L   Total Bilirubin 0.9  0.3 - 1.2 mg/dL   GFR calc non Af Amer 32 (*) >90 mL/min   GFR calc Af Amer 37 (*) >90 mL/min  URINALYSIS, ROUTINE W REFLEX MICROSCOPIC      Result Value  Range   Color, Urine YELLOW  YELLOW   APPearance CLEAR  CLEAR   Specific Gravity, Urine 1.020   1.005 - 1.030   pH 5.5  5.0 - 8.0   Glucose, UA NEGATIVE  NEGATIVE mg/dL   Hgb urine dipstick NEGATIVE  NEGATIVE   Bilirubin Urine NEGATIVE  NEGATIVE   Ketones, ur NEGATIVE  NEGATIVE mg/dL   Protein, ur 161 (*) NEGATIVE mg/dL   Urobilinogen, UA 0.2  0.0 - 1.0 mg/dL   Nitrite NEGATIVE  NEGATIVE   Leukocytes, UA TRACE (*) NEGATIVE  LACTIC ACID, PLASMA      Result Value Range   Lactic Acid, Venous 1.7  0.5 - 2.2 mmol/L  TROPONIN I      Result Value Range   Troponin I <0.30  <0.30 ng/mL  URINE MICROSCOPIC-ADD ON      Result Value Range   Squamous Epithelial / LPF RARE  RARE   WBC, UA 0-2  <3 WBC/hpf   Bacteria, UA RARE  RARE   Urine-Other MUCOUS PRESENT     Dg Chest 2 View  05/27/2013   CLINICAL DATA:  Shortness of breath .  EXAM: CHEST  2 VIEW  COMPARISON:  06/29/2010.  FINDINGS: Mediastinum and hilar structures are normal. The lungs are clear. Cardiac structures are unremarkable. Degenerative changes thoracic spine and both shoulders.  IMPRESSION: No active cardiopulmonary disease.   Electronically Signed   By: Maisie Fus  Register   On: 05/27/2013 11:42     EKG Interpretation    Date/Time:  Sunday May 27 2013 10:43:43 EST Ventricular Rate:  84 PR Interval:  240 QRS Duration: 171 QT Interval:  430 QTC Calculation: 508 R Axis:   -82 Text Interpretation:  Sinus rhythm Ventricular premature complex Prolonged PR interval RBBB and LAFB Probable left ventricular hypertrophy No significant change since last tracing Confirmed by Denton Lank  MD, Darlen Gledhill ([id number]) on 05/27/2013 1:54:43 PM             Date: 05/27/2013  Rate: 84  Rhythm: normal sinus rhythm  QRS Axis: left  Intervals: PR prolonged  ST/T Wave abnormalities: nonspecific ST/T changes  Conduction Disutrbances:right bundle branch block  Narrative Interpretation:   Old EKG Reviewed: none available    MDM  Iv ns. Labs. Cxr.  Reviewed nursing notes and prior charts for additional history.    cxr without  infiltrate/pna.  Recheck pt, no increased wob.   Family present now,  Indicates recent cough, congestion, aches, fevers.  ?viral/flu like illness.   Pt alert, content, nad, breathing comfortably, appears stable for d/c.   Additional family arrive, updated on xr,labs.  Pt feels much improved. Is alert, oriented. Denies pain. States breathing fine.  Additional fluids in ed. Pt ambulates on own in ED.  Appears stable for d/c.    Suzi Roots, MD 05/27/13 1354  Suzi Roots, MD 05/27/13 [phone]

## 2013-05-29 ENCOUNTER — Ambulatory Visit: Payer: Medicare Other | Admitting: Physical Therapy

## 2013-05-29 ENCOUNTER — Ambulatory Visit: Payer: Medicare Other | Admitting: Speech Pathology

## 2013-06-06 ENCOUNTER — Ambulatory Visit: Payer: Medicare Other

## 2013-06-06 ENCOUNTER — Ambulatory Visit: Payer: Medicare Other | Admitting: Physical Therapy

## 2013-06-08 ENCOUNTER — Ambulatory Visit: Payer: Medicare Other

## 2013-06-08 ENCOUNTER — Ambulatory Visit: Payer: Medicare Other | Attending: Neurology | Admitting: Physical Therapy

## 2013-06-08 DIAGNOSIS — G20A1 Parkinson's disease without dyskinesia, without mention of fluctuations: Secondary | ICD-10-CM | POA: Insufficient documentation

## 2013-06-08 DIAGNOSIS — R279 Unspecified lack of coordination: Secondary | ICD-10-CM | POA: Insufficient documentation

## 2013-06-08 DIAGNOSIS — IMO0001 Reserved for inherently not codable concepts without codable children: Secondary | ICD-10-CM | POA: Insufficient documentation

## 2013-06-08 DIAGNOSIS — M629 Disorder of muscle, unspecified: Secondary | ICD-10-CM | POA: Insufficient documentation

## 2013-06-08 DIAGNOSIS — G20 Parkinson's disease: Secondary | ICD-10-CM | POA: Insufficient documentation

## 2013-06-08 DIAGNOSIS — M2420 Disorder of ligament, unspecified site: Secondary | ICD-10-CM | POA: Insufficient documentation

## 2013-06-12 ENCOUNTER — Ambulatory Visit: Payer: Medicare Other | Admitting: Physical Therapy

## 2013-06-12 ENCOUNTER — Ambulatory Visit: Payer: Medicare Other | Admitting: Speech Pathology

## 2013-06-12 ENCOUNTER — Ambulatory Visit: Payer: Medicare Other | Admitting: Occupational Therapy

## 2013-06-14 ENCOUNTER — Ambulatory Visit: Payer: Medicare Other | Admitting: Occupational Therapy

## 2013-06-14 ENCOUNTER — Encounter: Payer: Medicare Other | Admitting: Speech Pathology

## 2013-06-14 ENCOUNTER — Ambulatory Visit: Payer: Medicare Other | Admitting: Physical Therapy

## 2013-06-18 ENCOUNTER — Ambulatory Visit: Payer: Medicare Other | Admitting: Occupational Therapy

## 2013-06-18 ENCOUNTER — Ambulatory Visit: Payer: Medicare Other | Admitting: Physical Therapy

## 2013-06-21 ENCOUNTER — Ambulatory Visit: Payer: Medicare Other | Admitting: Physical Therapy

## 2013-06-21 ENCOUNTER — Ambulatory Visit: Payer: Medicare Other | Admitting: Occupational Therapy

## 2013-06-25 ENCOUNTER — Ambulatory Visit: Payer: Medicare Other | Admitting: Physical Therapy

## 2013-06-25 ENCOUNTER — Ambulatory Visit: Payer: Medicare Other | Admitting: Occupational Therapy

## 2013-06-28 ENCOUNTER — Ambulatory Visit: Payer: Medicare Other

## 2013-06-28 ENCOUNTER — Ambulatory Visit: Payer: Medicare Other | Admitting: Physical Therapy

## 2013-06-28 ENCOUNTER — Ambulatory Visit: Payer: Medicare Other | Admitting: Occupational Therapy

## 2013-07-10 ENCOUNTER — Ambulatory Visit (INDEPENDENT_AMBULATORY_CARE_PROVIDER_SITE_OTHER): Payer: Medicare Other | Admitting: Neurology

## 2013-07-10 ENCOUNTER — Encounter: Payer: Self-pay | Admitting: Neurology

## 2013-07-10 VITALS — BP 140/68 | HR 56 | Temp 97.3°F | Ht 71.25 in | Wt 182.6 lb

## 2013-07-10 DIAGNOSIS — F329 Major depressive disorder, single episode, unspecified: Secondary | ICD-10-CM

## 2013-07-10 DIAGNOSIS — F3289 Other specified depressive episodes: Secondary | ICD-10-CM

## 2013-07-10 DIAGNOSIS — G20 Parkinson's disease: Secondary | ICD-10-CM

## 2013-07-10 MED ORDER — CARBIDOPA-LEVODOPA 25-100 MG PO TABS: 1.0000 | ORAL_TABLET | Freq: Three times a day (TID) | ORAL | Status: DC

## 2013-07-10 NOTE — Progress Notes (Signed)
Ronald Benitez was seen today in the movement disorders clinic for neurologic consultation at the request of Horton Finer, MD.  The patient is accompanied in the office today by his wife, who supplements the history.  Patient was diagnosed with Parkinson's disease on 05/08/2013.  Last visit, we started the patient on carbidopa/levodopa 25/100, one tablet 3 times per day.  The patient was also enrolled in the Parkinson's disease therapy program.  He does think that the therapy helped.  He is not sure of the levodopa has been helpful.  He is not doing faithful cardiovascular exercise.  He has fallen one time.  He tripped over a rug.  No hallucinations.  No lightheadedness or near syncope.   PREVIOUS MEDICATIONS: none to date  ALLERGIES:  No Known Allergies  CURRENT MEDICATIONS:    Medication List       This list is accurate as of: 07/10/13 11:11 AM.  Always use your most recent med list.               aspirin 325 MG EC tablet  Take 325 mg by mouth daily.     B-complex with vitamin C tablet  Take 1 tablet by mouth daily.     captopril 25 MG tablet  Commonly known as:  CAPOTEN  Take 25 mg by mouth 2 (two) times daily.     carbidopa-levodopa 25-100 MG per tablet  Commonly known as:  SINEMET IR  Take 1 tablet by mouth 3 (three) times daily.     desipramine 50 MG tablet  Commonly known as:  NORPRAMIN  Take 50 mg by mouth every evening.     finasteride 5 MG tablet  Commonly known as:  PROSCAR  Take 5 mg by mouth daily.     Fish Oil 1000 MG Caps  Take 1,000 mg by mouth daily.     glimepiride 1 MG tablet  Commonly known as:  AMARYL  Take 1 mg by mouth daily with breakfast.     LORazepam 0.5 MG tablet  Commonly known as:  ATIVAN  Take 0.5 mg by mouth every 8 (eight) hours.     metFORMIN 500 MG tablet  Commonly known as:  GLUCOPHAGE  Take 500 mg by mouth 2 (two) times daily with a meal.     metoprolol 50 MG tablet  Commonly known as:  LOPRESSOR  Take 50  mg by mouth 2 (two) times daily.     oseltamivir 75 MG capsule  Commonly known as:  TAMIFLU  Take 1 capsule (75 mg total) by mouth every 12 (twelve) hours.     simvastatin 80 MG tablet  Commonly known as:  ZOCOR  Take 80 mg by mouth every evening.     vitamin E 100 UNIT capsule  Take 100 Units by mouth daily.         PAST MEDICAL HISTORY:   Past Medical History  Diagnosis Date  . HTN (hypertension)   . Hyperlipidemia   . DM (diabetes mellitus)   . GERD (gastroesophageal reflux disease)   . CAD (coronary artery disease)   . Depression     meds since 1986  . Chronic kidney disease     PAST SURGICAL HISTORY:   Past Surgical History  Procedure Laterality Date  . Angioplasty  1997  . Esophagogastroduodenoscopy endoscopy      SOCIAL HISTORY:   History   Social History  . Marital Status: Married    Spouse Name: N/A    Number  of Children: N/A  . Years of Education: N/A   Occupational History  . retired    Social History Main Topics  . Smoking status: Former Research scientist (life sciences)  . Smokeless tobacco: Never Used     Comment: quit cigarettes 1985, quit cigars 04/2013  . Alcohol Use: No  . Drug Use: Not on file  . Sexual Activity: Not on file   Other Topics Concern  . Not on file   Social History Narrative  . No narrative on file    FAMILY HISTORY:   Family Status  Relation Status Death Age  . Mother Deceased     Heart Attack  . Father Deceased     Heart Problems  . Sister Alive     2, Lung Cancer  . Brother Deceased     Encephalitis  . Son Alive     3, Diabetes  . Daughter Alive     ROS:  A complete 10 system review of systems was obtained and was unremarkable apart from what is mentioned above.  PHYSICAL EXAMINATION:    VITALS:   Filed Vitals:   07/10/13 1104  BP: 140/68  Pulse: 56  Temp: 97.3 F (36.3 C)  Height: 5' 11.25" (1.81 m)  Weight: 182 lb 9 oz (82.81 kg)    GEN:  The patient appears stated age and is in NAD.   Neurological  examination:  Orientation: The patient is alert and oriented x3. Fund of knowledge is appropriate.  Recent and remote memory are intact.  Attention and concentration are normal.    Able to name objects and repeat phrases.   Movement examination: Tone: There is increased tone in the right upper extremity, overall mild.  In the LUE, there is normal tone today.  The tone in the lower extremities is normal.  Abnormal movements: There is a LUE resting tremor that increases with distraction (naming months backward). Coordination:  There is mild decremation with RAM's, seen mostly in the LUE Gait and Station: The patient has no significant difficulty arising out of a deep-seated chair without the use of the hands. The patient's stride length is decreased but improved.  He leans to the R.  he has a negative pull test.  ASSESSMENT/PLAN:  1.  Parkinsonism.  I suspect that this does represent idiopathic Parkinson's disease.  The patient has tremor, bradykinesia, rigidity and postural instability.  -He will remain on carbidopa/levodopa 25/100, one tablet 3 times per day prior to meals.  -We again talked about the importance of safe, cardiovascular exercise.  Patient education was provided. 2.  Falls  -This is likely due to a combination of diabetic PN and parkinsonism.  Again, the desipramine may be contributing as well.  I talked to him about this in detail today.  He will talk this over with his primary care physician as well. 3.  I will see him back in the next 3 months, sooner should new neuro issues arise.

## 2013-10-09 ENCOUNTER — Ambulatory Visit: Payer: Medicare Other | Admitting: Neurology

## 2013-10-10 ENCOUNTER — Telehealth: Payer: Self-pay | Admitting: Neurology

## 2013-10-10 NOTE — Telephone Encounter (Signed)
Pt no showed 10/09/13 appt w/ Dr. Carles Collet. He called today to r/s. Appt marked as no show but letter not sent since he has already called to r/s / Sherri S.

## 2013-10-16 ENCOUNTER — Other Ambulatory Visit: Payer: Self-pay | Admitting: Internal Medicine

## 2013-10-16 ENCOUNTER — Ambulatory Visit (INDEPENDENT_AMBULATORY_CARE_PROVIDER_SITE_OTHER): Payer: Medicare Other | Admitting: Neurology

## 2013-10-16 ENCOUNTER — Encounter: Payer: Self-pay | Admitting: Neurology

## 2013-10-16 ENCOUNTER — Ambulatory Visit
Admission: RE | Admit: 2013-10-16 | Discharge: 2013-10-16 | Disposition: A | Discharge status: Home / Self Care | Payer: Medicare Other | Source: Ambulatory Visit | Attending: Internal Medicine | Admitting: Internal Medicine

## 2013-10-16 VITALS — BP 180/80 | HR 89 | Wt 187.6 lb

## 2013-10-16 DIAGNOSIS — R0602 Shortness of breath: Secondary | ICD-10-CM

## 2013-10-16 DIAGNOSIS — R0600 Dyspnea, unspecified: Secondary | ICD-10-CM

## 2013-10-16 DIAGNOSIS — R0989 Other specified symptoms and signs involving the circulatory and respiratory systems: Secondary | ICD-10-CM

## 2013-10-16 DIAGNOSIS — R0609 Other forms of dyspnea: Secondary | ICD-10-CM

## 2013-10-16 DIAGNOSIS — G20A1 Parkinson's disease without dyskinesia, without mention of fluctuations: Secondary | ICD-10-CM

## 2013-10-16 DIAGNOSIS — R0902 Hypoxemia: Secondary | ICD-10-CM

## 2013-10-16 DIAGNOSIS — G20 Parkinson's disease: Secondary | ICD-10-CM

## 2013-10-16 MED ORDER — CARBIDOPA-LEVODOPA 25-100 MG PO TABS: ORAL_TABLET | ORAL | Status: DC

## 2013-10-16 NOTE — Patient Instructions (Addendum)
1. Increase Carbidopa Levodopa 25/100 IR to 2 tablets in the am, 2 tablets in the afternoon and 1 in the evening.  2. Go to [facility] today to be evaluated by one of their primary MD's.  3. You have been referred to Neuro Rehab. They will call you directly to schedule an appointment.  Please call [phone] if you do not hear from them.

## 2013-10-16 NOTE — Progress Notes (Signed)
Ronald Benitez was seen today in the movement disorders clinic for neurologic consultation at the request of Horton Finer, MD.  The patient is accompanied in the office today by his wife, who supplements the history.  Patient was diagnosed with Parkinson's disease on 05/08/2013.  Last visit, we started the patient on carbidopa/levodopa 25/100, one tablet 3 times per day.  The patient was also enrolled in the Parkinson's disease therapy program.  He does think that the therapy helped.  He is not sure of the levodopa has been helpful.  He is not doing faithful cardiovascular exercise.  He has fallen one time.  He tripped over a rug.  No hallucinations.  No lightheadedness or near syncope.  10/16/13 update:  The patient is following up regarding Parkinson's disease.  He is accompanied by his wife who helps to supplement the history.  He is currently on carbidopa/levodopa 25/100, one tablet 3 times per day.  He has a history of falls, which are multifactorial, likely due to Parkinson's disease as well as peripheral neuropathy.  No falls since last visit but had near falls.  He is off of the desipramine. Pt not sure if the carbidopa/levodopa 25/100 working.   Having a hard time getting out of the chair.  He seems more unstable.  He has developed a cough.  He has noticed some shortness of breath.  His wife states that he moans all of the time, but the patient cannot state why he does that.  Last visit to PCP was in February.   PREVIOUS MEDICATIONS: none to date  ALLERGIES:  No Known Allergies  CURRENT MEDICATIONS:    Medication List       This list is accurate as of: 10/16/13 12:58 PM.  Always use your most recent med list.               aspirin 325 MG EC tablet  Take 325 mg by mouth daily.     B-complex with vitamin C tablet  Take 1 tablet by mouth daily.     captopril 25 MG tablet  Commonly known as:  CAPOTEN  Take 25 mg by mouth 2 (two) times daily.     carbidopa-levodopa  25-100 MG per tablet  Commonly known as:  SINEMET IR  Take 1 tablet by mouth 3 (three) times daily.     desipramine 50 MG tablet  Commonly known as:  NORPRAMIN  Take 50 mg by mouth every evening.     finasteride 5 MG tablet  Commonly known as:  PROSCAR  Take 5 mg by mouth daily.     Fish Oil 1000 MG Caps  Take 1,000 mg by mouth daily.     glimepiride 1 MG tablet  Commonly known as:  AMARYL  Take 1 mg by mouth daily with breakfast.     LORazepam 0.5 MG tablet  Commonly known as:  ATIVAN  Take 0.5 mg by mouth every 8 (eight) hours.     metFORMIN 500 MG tablet  Commonly known as:  GLUCOPHAGE  Take 500 mg by mouth 2 (two) times daily with a meal.     metoprolol 50 MG tablet  Commonly known as:  LOPRESSOR  Take 50 mg by mouth 2 (two) times daily.     simvastatin 80 MG tablet  Commonly known as:  ZOCOR  Take 80 mg by mouth every evening.     vitamin E 100 UNIT capsule  Take 100 Units by mouth daily.  PAST MEDICAL HISTORY:   Past Medical History  Diagnosis Date  . HTN (hypertension)   . Hyperlipidemia   . DM (diabetes mellitus)   . GERD (gastroesophageal reflux disease)   . CAD (coronary artery disease)   . Depression     meds since 1986  . Chronic kidney disease     PAST SURGICAL HISTORY:   Past Surgical History  Procedure Laterality Date  . Angioplasty  1997  . Esophagogastroduodenoscopy endoscopy      SOCIAL HISTORY:   History   Social History  . Marital Status: Married    Spouse Name: N/A    Number of Children: N/A  . Years of Education: N/A   Occupational History  . retired    Social History Main Topics  . Smoking status: Former Research scientist (life sciences)  . Smokeless tobacco: Never Used     Comment: quit cigarettes 1985, quit cigars 04/2013  . Alcohol Use: No  . Drug Use: Not on file  . Sexual Activity: Not on file   Other Topics Concern  . Not on file   Social History Narrative  . No narrative on file    FAMILY HISTORY:   Family Status    Relation Status Death Age  . Mother Deceased     Heart Attack  . Father Deceased     Heart Problems  . Sister Alive     2, Lung Cancer  . Brother Deceased     Encephalitis  . Son Alive     3, Diabetes  . Daughter Alive     ROS:  A complete 10 system review of systems was obtained and was unremarkable apart from what is mentioned above.  PHYSICAL EXAMINATION:    VITALS:   Filed Vitals:   10/16/13 1252  BP: 180/80  Pulse: 89  Weight: 187 lb 9 oz (85.078 kg)  SpO2: 91%    GEN:  The patient appears stated age.  Was moaning in waiting room.   HEENT:  [place]/AT.  MMM. CV:  RRR Lungs:  Clear but some conversational dyspnea.  Definite dyspnea on exertion.  SpO2 at baseline was 94-95% and after 10 feet of walking dropped to 90% on RA.   Neurological examination:  Orientation: The patient is alert and oriented x3. Fund of knowledge is appropriate.  Recent and remote memory are intact.  Attention and concentration are normal.    Able to name objects and repeat phrases.   Movement examination: Tone: There is increased tone in the LUE, moderate Abnormal movements: There is no tremor noted today Coordination:  There is moderate decremation today with rapid alternating movements, bilaterally in the upper extremities. Gait and Station: The patient has significant difficulty getting up out of a chair today.  He is very unstable and short stepped.  He has a festinating gait.    ASSESSMENT/PLAN:  1.  idiopathic Parkinson's disease.  -I. will increase the patient's carbidopa/levodopa 25/100, from one tablet 3 times per day to 2 tablets in the morning, 2 tablets in the afternoon and one tablet in the evening.  -I. will send a new referral to the neuro rehabilitation Center. 2.  Falls  -This is likely due to a combination of diabetic PN and parkinsonism.  He has not had any falls recently. 3.  shortness of breath and dyspnea on exertion, with new-onset cough.  -I. am concerned that there  is something larger going on here medically.  I called his primary care physician and talked with Dr. Felipa Eth.  They were kind enough to see the patient today and we will transport him via wheelchair to their office. 4.  I will f/u with him in the next few months, sooner should new neuro issues arise.

## 2013-10-18 ENCOUNTER — Inpatient Hospital Stay: Admission: RE | Admit: 2013-10-18 | Payer: Medicare Other | Source: Ambulatory Visit

## 2013-10-18 ENCOUNTER — Other Ambulatory Visit: Payer: Self-pay | Admitting: Internal Medicine

## 2013-10-18 DIAGNOSIS — R0902 Hypoxemia: Secondary | ICD-10-CM

## 2013-10-19 ENCOUNTER — Ambulatory Visit (HOSPITAL_COMMUNITY)
Admission: RE | Admit: 2013-10-19 | Discharge: 2013-10-19 | Disposition: A | Discharge status: Home / Self Care | Payer: Medicare Other | Source: Ambulatory Visit | Attending: Internal Medicine | Admitting: Internal Medicine

## 2013-10-19 ENCOUNTER — Other Ambulatory Visit: Payer: Self-pay | Admitting: Internal Medicine

## 2013-10-19 ENCOUNTER — Encounter (HOSPITAL_COMMUNITY)
Admission: RE | Admit: 2013-10-19 | Discharge: 2013-10-19 | Disposition: A | Discharge status: Home / Self Care | Payer: Medicare Other | Source: Ambulatory Visit | Attending: Internal Medicine | Admitting: Internal Medicine

## 2013-10-19 DIAGNOSIS — R0602 Shortness of breath: Secondary | ICD-10-CM | POA: Insufficient documentation

## 2013-10-19 DIAGNOSIS — R0902 Hypoxemia: Secondary | ICD-10-CM

## 2013-10-19 DIAGNOSIS — R059 Cough, unspecified: Secondary | ICD-10-CM | POA: Insufficient documentation

## 2013-10-19 DIAGNOSIS — R05 Cough: Secondary | ICD-10-CM | POA: Insufficient documentation

## 2013-10-19 MED ORDER — TECHNETIUM TC 99M DIETHYLENETRIAME-PENTAACETIC ACID: 41.3000 | Freq: Once | INTRAVENOUS | Status: DC | PRN

## 2013-10-19 MED ORDER — TECHNETIUM TO 99M ALBUMIN AGGREGATED
5.8000 | Freq: Once | INTRAVENOUS | Status: AC | PRN
  Administered 2013-10-19: 6 via INTRAVENOUS

## 2013-10-31 ENCOUNTER — Ambulatory Visit (INDEPENDENT_AMBULATORY_CARE_PROVIDER_SITE_OTHER): Payer: Medicare Other | Admitting: Emergency Medicine

## 2013-10-31 ENCOUNTER — Encounter: Payer: Self-pay | Admitting: Emergency Medicine

## 2013-10-31 VITALS — BP 148/68 | HR 52 | Ht 72.0 in | Wt 188.0 lb

## 2013-10-31 DIAGNOSIS — R0600 Dyspnea, unspecified: Secondary | ICD-10-CM

## 2013-10-31 DIAGNOSIS — R0609 Other forms of dyspnea: Secondary | ICD-10-CM

## 2013-10-31 DIAGNOSIS — R0989 Other specified symptoms and signs involving the circulatory and respiratory systems: Secondary | ICD-10-CM

## 2013-10-31 NOTE — Patient Instructions (Signed)
We will perform full pulmonary function testing  Follow with Dr Lamonte Sakai next available appointment to review

## 2013-10-31 NOTE — Progress Notes (Signed)
Subjective:    Patient ID: Ronald Benitez, male    DOB: [birth year], 80 y.o.   MRN: [id number]  HPI 80 yo former smoker (83 pk-yrs), hx CAD / PTCI, HTN, DM, Parkinson's and hiatal hernia. He is referred for dyspnea by Drs Inda Merlin and Osbourne. He tells me he was well until about a month ago when he developed congestion and productive cough, limiting enough that he has had trouble walking through the house. No chest pain. He was evaluated and found to be hypoxemic, started on exertional O2 by PCP. A CXR was clear, subsequent V/q scan was low prob for PE. His cough has started to improve, but he remains SOB. He otherwise states that he occasionally coughs and has trouble when swallowing.    Review of Systems  Constitutional: Negative for fever and unexpected weight change.  HENT: Positive for trouble swallowing ( at times-- choking). Negative for congestion, dental problem, ear pain, nosebleeds, postnasal drip, rhinorrhea, sinus pressure, sneezing and sore throat.   Eyes: Negative for redness and itching.  Respiratory: Positive for shortness of breath and wheezing. Negative for chest tightness.   Cardiovascular: Negative for palpitations and leg swelling.  Gastrointestinal: Negative for nausea and vomiting.  Genitourinary: Negative for dysuria.  Musculoskeletal: Negative for joint swelling.  Skin: Negative for rash.  Neurological: Negative for headaches.  Hematological: Does not bruise/bleed easily.  Psychiatric/Behavioral: Positive for dysphoric mood. The patient is not nervous/anxious.    Past Medical History  Diagnosis Date  . HTN (hypertension)   . Hyperlipidemia   . DM (diabetes mellitus)   . GERD (gastroesophageal reflux disease)   . CAD (coronary artery disease)   . Depression     meds since 1986  . Chronic kidney disease   . Heart attack      Family History  Problem Relation Age of Onset  . Lung cancer Sister   . COPD Sister   . Heart attack Mother   . Heart disease  Father      History   Social History  . Marital Status: Married    Spouse Name: N/A    Number of Children: N/A  . Years of Education: N/A   Occupational History  . retired    Social History Main Topics  . Smoking status: Former Smoker -- 1.50 packs/day    Types: Cigarettes, Cigars    Quit date: 05/07/2013  . Smokeless tobacco: Never Used     Comment: quit cigarettes 1985 1.5 PPD, quit cigars 05/2013-- 5 cigars per day  . Alcohol Use: No  . Drug Use: No  . Sexual Activity: Not on file   Other Topics Concern  . Not on file   Social History Narrative  . 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   No Known Allergies   Outpatient Prescriptions Prior to Visit  Medication Sig Dispense Refill  . aspirin 325 MG EC tablet Take 325 mg by mouth daily.      . B Complex-C (B-COMPLEX WITH VITAMIN C) tablet Take 1 tablet by mouth daily.      . captopril (CAPOTEN) 25 MG tablet Take 25 mg by mouth 2 (two) times daily.      . carbidopa-levodopa (SINEMET IR) 25-100 MG per tablet 2 in the AM and at noon, 1 in the evening  450 tablet  3  . finasteride (PROSCAR) 5 MG tablet Take  5 mg by mouth daily.      Marland Kitchen glimepiride (AMARYL) 1 MG tablet Take 1 mg by mouth daily with breakfast.      . LORazepam (ATIVAN) 0.5 MG tablet Take 0.5 mg by mouth every 8 (eight) hours.      . metFORMIN (GLUCOPHAGE) 500 MG tablet Take 500 mg by mouth 2 (two) times daily with a meal.      . metoprolol (LOPRESSOR) 50 MG tablet Take 50 mg by mouth 2 (two) times daily.      . Omega-3 Fatty Acids (FISH OIL) 1000 MG CAPS Take 1,000 mg by mouth daily.      . simvastatin (ZOCOR) 80 MG tablet Take 80 mg by mouth every evening.       . vitamin E 100 UNIT capsule Take 400 Units by mouth daily.        No facility-administered medications prior to visit.         Objective:   Physical Exam Filed Vitals:   10/31/13 1143    BP: 148/68  Pulse: 52  Height: 6' (1.829 m)  Weight: 188 lb (85.276 kg)  SpO2: 97%   Gen: Pleasant, well-nourished, in no distress,  normal affect  ENT: No lesions,  mouth clear,  oropharynx clear, no postnasal drip  Neck: No JVD, no TMG, no carotid bruits  Lungs: No use of accessory muscles, no wheezes, some Bibasi;lar insp crackles.   Cardiovascular: RRR, heart sounds normal, no murmur or gallops, no peripheral edema  Musculoskeletal: No deformities, no cyanosis or clubbing  Neuro: alert, non focal  Skin: Warm, no lesions or rashes     Assessment & Plan:  Dyspnea Dyspnea that has worsened recent;ly in setting of apparent URI vs bronchitis. No wheeze on exam today but he is at high risk for COPD given his tobacco hx. Consider also ILD although CXR from Dr Inda Merlin' office reassuring.  - check full PFT  - f/u next available to review - no empiric BD's today, will decide about starting next visit - may decide to image further depending on PFT results.

## 2013-10-31 NOTE — Assessment & Plan Note (Signed)
Dyspnea that has worsened recent;ly in setting of apparent URI vs bronchitis. No wheeze on exam today but he is at high risk for COPD given his tobacco hx. Consider also ILD although CXR from Dr Inda Merlin' office reassuring.  - check full PFT  - f/u next available to review - no empiric BD's today, will decide about starting next visit - may decide to image further depending on PFT results.

## 2013-11-08 ENCOUNTER — Ambulatory Visit (INDEPENDENT_AMBULATORY_CARE_PROVIDER_SITE_OTHER): Payer: Medicare Other | Admitting: Emergency Medicine

## 2013-11-08 ENCOUNTER — Ambulatory Visit (HOSPITAL_COMMUNITY)
Admission: RE | Admit: 2013-11-08 | Discharge: 2013-11-08 | Disposition: A | Discharge status: Home / Self Care | Payer: Medicare Other | Source: Ambulatory Visit | Attending: Emergency Medicine | Admitting: Emergency Medicine

## 2013-11-08 ENCOUNTER — Encounter: Payer: Self-pay | Admitting: Emergency Medicine

## 2013-11-08 VITALS — BP 120/64 | HR 82 | Ht 71.0 in | Wt 189.4 lb

## 2013-11-08 DIAGNOSIS — R0609 Other forms of dyspnea: Secondary | ICD-10-CM | POA: Insufficient documentation

## 2013-11-08 DIAGNOSIS — R0989 Other specified symptoms and signs involving the circulatory and respiratory systems: Secondary | ICD-10-CM

## 2013-11-08 DIAGNOSIS — Z87891 Personal history of nicotine dependence: Secondary | ICD-10-CM | POA: Insufficient documentation

## 2013-11-08 DIAGNOSIS — R0600 Dyspnea, unspecified: Secondary | ICD-10-CM

## 2013-11-08 LAB — PULMONARY FUNCTION TEST
DL/VA % PRED: 52 %
DL/VA: 2.42 ml/min/mmHg/L
DLCO UNC % PRED: 27 %
DLCO UNC: 9.28 ml/min/mmHg
FEF 25-75 POST: 1.07 L/s
FEF 25-75 Pre: 1.14 L/sec
FEF2575-%Change-Post: -6 %
FEF2575-%PRED-POST: 55 %
FEF2575-%PRED-PRE: 59 %
FEV1-%CHANGE-POST: -11 %
FEV1-%Pred-Post: 58 %
FEV1-%Pred-Pre: 66 %
FEV1-Post: 1.68 L
FEV1-Pre: 1.89 L
FEV1FVC-%Change-Post: -10 %
FEV1FVC-%Pred-Pre: 98 %
FEV6-%Change-Post: 0 %
FEV6-%PRED-POST: 70 %
FEV6-%Pred-Pre: 70 %
FEV6-PRE: 2.68 L
FEV6-Post: 2.67 L
FEV6FVC-%Change-Post: 0 %
FEV6FVC-%PRED-POST: 106 %
FEV6FVC-%Pred-Pre: 106 %
FVC-%Change-Post: 0 %
FVC-%Pred-Post: 66 %
FVC-%Pred-Pre: 66 %
FVC-POST: 2.69 L
FVC-Pre: 2.7 L
POST FEV1/FVC RATIO: 63 %
Post FEV6/FVC ratio: 99 %
Pre FEV1/FVC ratio: 70 %
Pre FEV6/FVC Ratio: 99 %
RV % pred: 74 %
RV: 2.06 L
TLC % pred: 65 %
TLC: 4.73 L

## 2013-11-08 MED ORDER — ALBUTEROL SULFATE (2.5 MG/3ML) 0.083% IN NEBU
2.5000 mg | INHALATION_SOLUTION | Freq: Once | RESPIRATORY_TRACT | Status: AC
  Administered 2013-11-08: 2.5 mg via RESPIRATORY_TRACT

## 2013-11-08 MED ORDER — TIOTROPIUM BROMIDE MONOHYDRATE 18 MCG IN CAPS: 18.0000 ug | ORAL_CAPSULE | Freq: Every day | RESPIRATORY_TRACT | Status: DC

## 2013-11-08 NOTE — Assessment & Plan Note (Signed)
PFT support COPD and restriction, likely from obesity.  - repeat walking oximetry today to confirm that he needs w exertion - trial of spiriva x 1 month to see if he benefits - rov 1

## 2013-11-08 NOTE — Progress Notes (Signed)
Subjective:    Patient ID: Ronald Benitez, male    DOB: 14-Nov-1928, 80 y.o.   MRN: [id number]  Shortness of Breath Associated symptoms include wheezing. Pertinent negatives include no ear pain, fever, headaches, leg swelling, rash, rhinorrhea, sore throat or vomiting.   80 yo former smoker (65 pk-yrs), hx CAD / PTCI, HTN, DM, Parkinson's and hiatal hernia. He is referred for dyspnea by Drs Inda Merlin and Osbourne. He tells me he was well until about a month ago when he developed congestion and productive cough, limiting enough that he has had trouble walking through the house. No chest pain. He was evaluated and found to be hypoxemic, started on exertional O2 by PCP. A CXR was clear, subsequent V/q scan was low prob for PE. His cough has started to improve, but he remains SOB. He otherwise states that he occasionally coughs and has trouble when swallowing.   ROV 11/08/13 -- follows for SOB. He has oxygen to use at night and w exertion, but he has been unable to carry it - too heavy. His breathing is unchanged.   He underwent [facility] 11/08/13 > mixed disease without BD response, restricted volumes, decrease DLCO.    Review of Systems  Constitutional: Negative for fever and unexpected weight change.  HENT: Positive for trouble swallowing ( at times-- choking). Negative for congestion, dental problem, ear pain, nosebleeds, postnasal drip, rhinorrhea, sinus pressure, sneezing and sore throat.   Eyes: Negative for redness and itching.  Respiratory: Positive for shortness of breath and wheezing. Negative for chest tightness.   Cardiovascular: Negative for palpitations and leg swelling.  Gastrointestinal: Negative for nausea and vomiting.  Genitourinary: Negative for dysuria.  Musculoskeletal: Negative for joint swelling.  Skin: Negative for rash.  Neurological: Negative for headaches.  Hematological: Does not bruise/bleed easily.  Psychiatric/Behavioral: Positive for dysphoric mood. The patient  is not nervous/anxious.    Past Medical History  Diagnosis Date  . HTN (hypertension)   . Hyperlipidemia   . DM (diabetes mellitus)   . GERD (gastroesophageal reflux disease)   . CAD (coronary artery disease)   . Depression     meds since 1986  . Chronic kidney disease   . Heart attack      Family History  Problem Relation Age of Onset  . Lung cancer Sister   . COPD Sister   . Heart attack Mother   . Heart disease Father      History   Social History  . Marital Status: Married    Spouse Name: N/A    Number of Children: N/A  . Years of Education: N/A   Occupational History  . retired    Social History Main Topics  . Smoking status: Former Smoker -- 1.50 packs/day    Types: Cigarettes, Cigars    Quit date: 05/07/2013  . Smokeless tobacco: Never Used     Comment: quit cigarettes 1985 1.5 PPD, quit cigars 05/2013-- 5 cigars per day  . Alcohol Use: No  . Drug Use: No  . Sexual Activity: Not on file   Other Topics Concern  . Not on file   Social History Narrative  . 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   No Known Allergies   Outpatient Prescriptions Prior to Visit  Medication Sig Dispense Refill  . aspirin 325 MG EC tablet Take 325 mg by mouth daily.      Marland Kitchen  B Complex Vitamins (VITAMIN B COMPLEX PO) Take 1 tablet by mouth every evening.      . B Complex-C (B-COMPLEX WITH VITAMIN C) tablet Take 1 tablet by mouth daily.      . captopril (CAPOTEN) 25 MG tablet Take 25 mg by mouth 2 (two) times daily.      . carbidopa-levodopa (SINEMET IR) 25-100 MG per tablet 2 in the AM and at noon, 1 in the evening  450 tablet  3  . cyanocobalamin 500 MCG tablet Take 500 mcg by mouth every evening.      . desipramine (NORPRAMIN) 50 MG tablet Take 100 mg by mouth at bedtime.      . finasteride (PROSCAR) 5 MG tablet Take 5 mg by mouth daily.      Marland Kitchen glimepiride (AMARYL) 1 MG  tablet Take 1 mg by mouth daily with breakfast.      . Glucosamine-Chondroitin-Vit D3 1500-1200-800 MG-MG-UNIT PACK Take 2 tablets by mouth daily.      . GuaiFENesin (MUCUS RELIEF ADULT PO) Take 2 tablets by mouth 2 (two) times daily.      Marland Kitchen LORazepam (ATIVAN) 0.5 MG tablet Take 0.5 mg by mouth every 8 (eight) hours.      . metFORMIN (GLUCOPHAGE) 500 MG tablet Take 500 mg by mouth 2 (two) times daily with a meal.      . metoprolol (LOPRESSOR) 50 MG tablet Take 50 mg by mouth 2 (two) times daily.      . Multiple Vitamins-Minerals (ICAPS) CAPS Take 2 capsules by mouth daily.      Marland Kitchen NITROGLYCERIN PO Take 1 tablet by mouth as needed.      . Omega-3 Fatty Acids (FISH OIL) 1000 MG CAPS Take 1,000 mg by mouth daily.      . polyethylene glycol (MIRALAX / GLYCOLAX) packet Take 17 g by mouth daily as needed.      [name] Sodium (STOOL SOFTENER & LAXATIVE PO) Take 2 tablets by mouth daily as needed.      . simvastatin (ZOCOR) 80 MG tablet Take 80 mg by mouth every evening.       . vitamin C (ASCORBIC ACID) 500 MG tablet Take 500 mg by mouth daily.      . vitamin E 100 UNIT capsule Take 400 Units by mouth daily.        No facility-administered medications prior to visit.         Objective:   Physical Exam Filed Vitals:   11/08/13 1502  BP: 120/64  Pulse: 82  Height: 5\' 11"  (1.803 m)  Weight: 189 lb 6.4 oz (85.911 kg)  SpO2: 95%   Gen: Pleasant, well-nourished, in no distress,  normal affect  ENT: No lesions,  mouth clear,  oropharynx clear, no postnasal drip  Neck: No JVD, no TMG, no carotid bruits  Lungs: No use of accessory muscles, no wheezes, some Bibasi;lar insp crackles.   Cardiovascular: RRR, heart sounds normal, no murmur or gallops, no peripheral edema  Musculoskeletal: No deformities, no cyanosis or clubbing  Neuro: alert, non focal  Skin: Warm, no lesions or rashes     Assessment & Plan:  Dyspnea PFT support COPD and restriction, likely from obesity.  -  repeat walking oximetry today to confirm that he needs w exertion - trial of spiriva x 1 month to see if he benefits - rov 1

## 2013-11-08 NOTE — Patient Instructions (Signed)
We will start Spiriva once a day to see if it helps your breathing.  Walking oximetry today to show whether you need to wear your oxygen more reliably when you exert yourself We will try to get a more portable oxygen system Follow with Dr Lamonte Sakai in 1 month

## 2013-11-12 ENCOUNTER — Telehealth: Payer: Self-pay | Admitting: Emergency Medicine

## 2013-11-12 DIAGNOSIS — R0600 Dyspnea, unspecified: Secondary | ICD-10-CM

## 2013-11-12 NOTE — Telephone Encounter (Addendum)
Spoke with Ronald Benitez  States that he has not been using his O2 at night nor has he been using it during the daytime with exertion. Ronald Benitez states that he feels he does not need it.  Would like to give it back to DME if possible Insurance denied POC and concentrator ordered. Ronald Benitez wants to know if he can give up O2 and wait to see how he does with the [place] first? If no improvement while using Spiriva then discuss using O2?  Patient brought letter by the office to be reviewed by Dr Lamonte Sakai and Nurse. (in Dr Lamonte Sakai Martina Sinner) Please advise Dr Lamonte Sakai. Thanks.

## 2013-11-12 NOTE — Telephone Encounter (Signed)
Yes this is OK, go ahead and d/c the oxygen

## 2013-11-13 NOTE — Telephone Encounter (Signed)
lmtcb x1 

## 2013-11-13 NOTE — Telephone Encounter (Signed)
Called spoke with pt. Aware of the below. He reports O2 is through [facility] not [employer] and wants this D/C'D nothing further needed

## 2013-11-16 ENCOUNTER — Ambulatory Visit (INDEPENDENT_AMBULATORY_CARE_PROVIDER_SITE_OTHER): Payer: Medicare Other | Admitting: Cardiovascular Disease

## 2013-11-16 ENCOUNTER — Encounter: Payer: Self-pay | Admitting: Cardiovascular Disease

## 2013-11-16 VITALS — BP 124/74 | HR 68 | Ht 71.5 in | Wt 182.0 lb

## 2013-11-16 DIAGNOSIS — E785 Hyperlipidemia, unspecified: Secondary | ICD-10-CM

## 2013-11-16 DIAGNOSIS — R0609 Other forms of dyspnea: Secondary | ICD-10-CM

## 2013-11-16 DIAGNOSIS — I2510 Atherosclerotic heart disease of native coronary artery without angina pectoris: Secondary | ICD-10-CM

## 2013-11-16 DIAGNOSIS — I10 Essential (primary) hypertension: Secondary | ICD-10-CM

## 2013-11-16 DIAGNOSIS — R0600 Dyspnea, unspecified: Secondary | ICD-10-CM

## 2013-11-16 DIAGNOSIS — R0989 Other specified symptoms and signs involving the circulatory and respiratory systems: Secondary | ICD-10-CM

## 2013-11-16 NOTE — Patient Instructions (Signed)
  We will see you back in follow up after the tests.  Dr Gwenlyn Found has ordered : 1.  Echocardiogram. Echocardiography is a painless test that uses sound waves to create images of your heart. It provides your doctor with information about the size and shape of your heart and how well your heart's chambers and valves are working. This procedure takes approximately one hour. There are no restrictions for this procedure.   2. Lexiscan Myoview- this is a test that looks at the blood flow to your heart muscle.  It takes approximately 2 1/2 hours. Please follow instruction sheet, as given.

## 2013-11-16 NOTE — Assessment & Plan Note (Signed)
On statin therapy followed by his PCP 

## 2013-11-16 NOTE — Assessment & Plan Note (Signed)
Status post stenting of coronary arteries by Dr. Daneen Schick in 1997 (in March and November of that year). He underwent cardiac catheterization to 3 years later and was told everything looked "okay". This was done after a positive stress test. He did have angina which is responsive prior to and after that but has not taken nitroglycerin since 2003. Over the last year particularly over last few months she's had progressive increase in dyspnea on exertion but denies chest pain. He has seen Dr. Malvin Johns , pulmonologist for evaluation as well. I'm going to get a 2-D echocardiogram and if oncologic Myoview stress test rule out an ischemic etiology.

## 2013-11-16 NOTE — Assessment & Plan Note (Signed)
Controlled on current medications 

## 2013-11-16 NOTE — Progress Notes (Signed)
11/16/2013 Ronald Benitez   10-May-1929  [id number]  Primary Physician Horton Finer, MD Primary Cardiologist: Lorretta Harp MD [name]   HPI:  Ronald Benitez is an 80 year old married Caucasian male father of 8, grandfather and 7 grandchildren with a prior accountant. He is a patient of Dr. Nehemiah Settle. He was referred here for evaluation of increasing dyspnea on exertion. This criticizes include remote tobacco abuse having smoked 50 pack years and stopped back in 1986. After that he smoked cigars up until this past November. History of diabetes, hypertension, and hyperlipidemia. Both parents died in an elderly age of heart-related issues. He had stents placed as well by Dr. Lew Dawes in March and November of 1997. He did not recatheterization to 3 years later and was told "everything was okay". He did have nitroglycerin responsive angina but has not taken supplement glycerin since 2003. Over the last year he's had progressive dyspnea on exertion for unclear reasons. He was referred to Dr. Court Joy come up pulmonologist, for pulmonary evaluation as well.   Current Outpatient Prescriptions  Medication Sig Dispense Refill  . aspirin 325 MG EC tablet Take 325 mg by mouth daily.      . B Complex Vitamins (VITAMIN B COMPLEX PO) Take 1 tablet by mouth every evening.      . captopril (CAPOTEN) 25 MG tablet Take 25 mg by mouth 2 (two) times daily.      . carbidopa-levodopa (SINEMET IR) 25-100 MG per tablet 2 in the AM and at noon, 1 in the evening  450 tablet  3  . desipramine (NORPRAMIN) 50 MG tablet Take 100 mg by mouth at bedtime.      . finasteride (PROSCAR) 5 MG tablet Take 5 mg by mouth daily.      Marland Kitchen glimepiride (AMARYL) 1 MG tablet Take 1 mg by mouth daily with breakfast.      . Glucosamine-Chondroitin-Vit D3 1500-1200-800 MG-MG-UNIT PACK Take 2 tablets by mouth daily.      Marland Kitchen LORazepam (ATIVAN) 0.5 MG tablet Take 0.5 mg by mouth every 8 (eight) hours.       . metFORMIN (GLUCOPHAGE) 500 MG tablet Take 500 mg by mouth 2 (two) times daily with a meal.      . metoprolol (LOPRESSOR) 50 MG tablet Take 50 mg by mouth 2 (two) times daily.      . Multiple Vitamins-Minerals (ICAPS) CAPS Take 2 capsules by mouth daily.      Marland Kitchen NITROGLYCERIN PO Take 1 tablet by mouth as needed.      . Omega-3 Fatty Acids (FISH OIL) 1000 MG CAPS Take 1,000 mg by mouth daily.      . polyethylene glycol (MIRALAX / GLYCOLAX) packet Take 17 g by mouth daily as needed.      [name] Sodium (STOOL SOFTENER & LAXATIVE PO) Take 2 tablets by mouth daily as needed.      . simvastatin (ZOCOR) 80 MG tablet Take 80 mg by mouth every evening.       . tiotropium (SPIRIVA) 18 MCG inhalation capsule Place 1 capsule (18 mcg total) into inhaler and inhale daily.  30 capsule  6  . vitamin C (ASCORBIC ACID) 500 MG tablet Take 500 mg by mouth daily.      . vitamin E 100 UNIT capsule Take 400 Units by mouth daily.        No current facility-administered medications for this visit.    No Known Allergies  History  Social History  . Marital Status: Married    Spouse Name: N/A    Number of Children: N/A  . Years of Education: N/A   Occupational History  . retired    Social History Main Topics  . Smoking status: Former Smoker -- 1.50 packs/day    Types: Cigarettes, Cigars    Quit date: 05/07/2013  . Smokeless tobacco: Never Used     Comment: quit cigarettes 1985 1.5 PPD, quit cigars 05/2013-- 5 cigars per day  . Alcohol Use: No  . Drug Use: No  . Sexual Activity: Not on file   Other Topics Concern  . Not on file   Social History Narrative  . No narrative on file     Review of Systems: General: negative for chills, fever, night sweats or weight changes.  Cardiovascular: negative for chest pain, dyspnea on exertion, edema, orthopnea, palpitations, paroxysmal nocturnal dyspnea or shortness of breath Dermatological: negative for rash Respiratory: negative for cough  or wheezing Urologic: negative for hematuria Abdominal: negative for nausea, vomiting, diarrhea, bright red blood per rectum, melena, or hematemesis Neurologic: negative for visual changes, syncope, or dizziness All other systems reviewed and are otherwise negative except as noted above.    Blood pressure 124/74, pulse 68, height 5' 11.5" (1.816 m), weight 182 lb (82.555 kg).  General appearance: alert and no distress Neck: no adenopathy, no carotid bruit, no JVD, supple, symmetrical, trachea midline and thyroid not enlarged, symmetric, no tenderness/mass/nodules Lungs: clear to auscultation bilaterally Heart: regular rate and rhythm, S1, S2 normal, no murmur, click, rub or gallop Extremities: extremities normal, atraumatic, no cyanosis or edema and 2+ pedal pulses  EKG normal sinus rhythm at 68 with bifascicular block, right bundle branch block and left anterior fascicular block  ASSESSMENT AND PLAN:   Hyperlipidemia On statin therapy followed by his PCP  Essential hypertension Controlled on current medications  Coronary artery disease Status post stenting of coronary arteries by Dr. Daneen Schick in 1997 (in March and November of that year). He underwent cardiac catheterization to 3 years later and was told everything looked "okay". This was done after a positive stress test. He did have angina which is responsive prior to and after that but has not taken nitroglycerin since 2003. Over the last year particularly over last few months she's had progressive increase in dyspnea on exertion but denies chest pain. He has seen Dr. Malvin Johns , pulmonologist for evaluation as well. I'm going to get a 2-D echocardiogram and if oncologic Myoview stress test rule out an ischemic etiology.      Lorretta Harp MD FACP,FACC,FAHA, [facility] 11/16/2013 10:37 AM

## 2013-11-29 ENCOUNTER — Telehealth (HOSPITAL_COMMUNITY): Payer: Self-pay

## 2013-12-04 ENCOUNTER — Other Ambulatory Visit (HOSPITAL_COMMUNITY): Payer: Self-pay | Admitting: Cardiovascular Disease

## 2013-12-04 ENCOUNTER — Ambulatory Visit (HOSPITAL_COMMUNITY)
Admission: RE | Admit: 2013-12-04 | Discharge: 2013-12-04 | Disposition: A | Discharge status: Home / Self Care | Payer: Medicare Other | Source: Ambulatory Visit | Attending: Cardiovascular Disease | Admitting: Cardiovascular Disease

## 2013-12-04 DIAGNOSIS — R0609 Other forms of dyspnea: Secondary | ICD-10-CM | POA: Insufficient documentation

## 2013-12-04 DIAGNOSIS — R0989 Other specified symptoms and signs involving the circulatory and respiratory systems: Principal | ICD-10-CM | POA: Insufficient documentation

## 2013-12-04 DIAGNOSIS — R0600 Dyspnea, unspecified: Secondary | ICD-10-CM

## 2013-12-04 DIAGNOSIS — I059 Rheumatic mitral valve disease, unspecified: Secondary | ICD-10-CM

## 2013-12-04 NOTE — Progress Notes (Addendum)
2D Echocardiogram with Bubble Study Complete.  12/04/2013   Deliah Boston, RDCS   Preliminary Technician Findings:  A small Patent Foramen Ovale was identified with provocation (patient cough).

## 2013-12-06 ENCOUNTER — Telehealth (HOSPITAL_COMMUNITY): Payer: Self-pay

## 2013-12-06 NOTE — Telephone Encounter (Signed)
Pt given detailed instructions for NUC MPI on Wednesday. Pt will call back on Tuesday to review instructions one last time prior to his appointment. Pt and son did RBV same.

## 2013-12-06 NOTE — Telephone Encounter (Signed)
Encounter complete. 

## 2013-12-12 ENCOUNTER — Encounter: Payer: Self-pay | Admitting: Emergency Medicine

## 2013-12-12 ENCOUNTER — Ambulatory Visit (INDEPENDENT_AMBULATORY_CARE_PROVIDER_SITE_OTHER): Payer: Medicare Other | Admitting: Emergency Medicine

## 2013-12-12 ENCOUNTER — Encounter (INDEPENDENT_AMBULATORY_CARE_PROVIDER_SITE_OTHER): Payer: Self-pay

## 2013-12-12 ENCOUNTER — Ambulatory Visit (HOSPITAL_COMMUNITY)
Admission: RE | Admit: 2013-12-12 | Discharge: 2013-12-12 | Disposition: A | Discharge status: Home / Self Care | Payer: Medicare Other | Source: Ambulatory Visit | Attending: Cardiology | Admitting: Cardiology

## 2013-12-12 VITALS — BP 120/68 | HR 79 | Ht 72.0 in | Wt 184.4 lb

## 2013-12-12 DIAGNOSIS — R0600 Dyspnea, unspecified: Secondary | ICD-10-CM

## 2013-12-12 DIAGNOSIS — I252 Old myocardial infarction: Secondary | ICD-10-CM | POA: Insufficient documentation

## 2013-12-12 DIAGNOSIS — R0602 Shortness of breath: Secondary | ICD-10-CM | POA: Insufficient documentation

## 2013-12-12 DIAGNOSIS — R0989 Other specified symptoms and signs involving the circulatory and respiratory systems: Secondary | ICD-10-CM

## 2013-12-12 DIAGNOSIS — E119 Type 2 diabetes mellitus without complications: Secondary | ICD-10-CM | POA: Insufficient documentation

## 2013-12-12 DIAGNOSIS — R9431 Abnormal electrocardiogram [ECG] [EKG]: Secondary | ICD-10-CM | POA: Insufficient documentation

## 2013-12-12 DIAGNOSIS — Z9861 Coronary angioplasty status: Secondary | ICD-10-CM | POA: Insufficient documentation

## 2013-12-12 DIAGNOSIS — I2510 Atherosclerotic heart disease of native coronary artery without angina pectoris: Secondary | ICD-10-CM | POA: Insufficient documentation

## 2013-12-12 DIAGNOSIS — J449 Chronic obstructive pulmonary disease, unspecified: Secondary | ICD-10-CM | POA: Insufficient documentation

## 2013-12-12 DIAGNOSIS — R0609 Other forms of dyspnea: Secondary | ICD-10-CM | POA: Insufficient documentation

## 2013-12-12 DIAGNOSIS — Z87891 Personal history of nicotine dependence: Secondary | ICD-10-CM | POA: Insufficient documentation

## 2013-12-12 DIAGNOSIS — J4489 Other specified chronic obstructive pulmonary disease: Secondary | ICD-10-CM | POA: Insufficient documentation

## 2013-12-12 DIAGNOSIS — I10 Essential (primary) hypertension: Secondary | ICD-10-CM | POA: Insufficient documentation

## 2013-12-12 DIAGNOSIS — Z8249 Family history of ischemic heart disease and other diseases of the circulatory system: Secondary | ICD-10-CM | POA: Insufficient documentation

## 2013-12-12 MED ORDER — ATROPINE SULFATE 0.1 MG/ML IJ SOLN
0.2000 mg | Freq: Once | INTRAMUSCULAR | Status: AC
  Administered 2013-12-12: 0.2 mg via INTRAVENOUS

## 2013-12-12 MED ORDER — TECHNETIUM TC 99M SESTAMIBI GENERIC - CARDIOLITE
10.8000 | Freq: Once | INTRAVENOUS | Status: AC | PRN
  Administered 2013-12-12: 11 via INTRAVENOUS

## 2013-12-12 MED ORDER — TECHNETIUM TC 99M SESTAMIBI GENERIC - CARDIOLITE
30.2000 | Freq: Once | INTRAVENOUS | Status: AC | PRN
  Administered 2013-12-12: 30.2 via INTRAVENOUS

## 2013-12-12 MED ORDER — DOBUTAMINE INFUSION FOR EP/ECHO/NUC (1000 MCG/ML)
0.0000 ug/kg/min | Freq: Once | INTRAVENOUS | Status: AC
  Administered 2013-12-12: 40 ug/kg/min via INTRAVENOUS

## 2013-12-12 NOTE — Patient Instructions (Signed)
Stop Spiriva Use the magnesium citrate as directed by Dr Inda Merlin and Isidoro Donning Get your cardiac stress testing today as planned Follow with Dr Lamonte Sakai in 3 months or sooner if you have any problems.

## 2013-12-12 NOTE — Procedures (Addendum)
[facility] [address] [place] [address] [phone]  Cardiology Nuclear Med Study  Ronald Benitez is a 80 y.o. male     MRN : [id number]     DOB: [birth year]  Procedure Date: 12/12/2013  Nuclear Med Background Indication for Stress Test:  Stent Patency and Abnormal EKG History:  COPD and CAD;MI;STENT/PTCA--08/1995 AND 04/1996;Last NUC MPI on 04/16/2001-EF=64% Cardiac Risk Factors: Family History - CAD, History of Smoking, Hypertension, Lipids and NIDDM  Symptoms:  DOE, Fatigue and SOB   Nuclear Pre-Procedure Caffeine/Decaff Intake:  1:00am NPO After: 11am   IV Site: R Forearm  IV 0.9% NS with Angio Cath:  22g  Chest Size (in):  44"  IV Started by: Rolene Course, RN  Height: 5' 11.5" (1.816 m)  Cup Size: n/a  BMI:  Body mass index is 25.03 kg/(m^2). Weight:  182 lb (82.555 kg)   Tech Comments:  n/a    Nuclear Med Study 1 or 2 day study: 1 day  Stress Test Type:  Dobutamine  Order Authorizing Provider:  Quay Burow, MD   Resting Radionuclide: Technetium 29m Sestamibi  Resting Radionuclide Dose: 10.8 mCi   Stress Radionuclide:  Technetium 70m Sestamibi  Stress Radionuclide Dose: 30.2 mCi           Stress Protocol Rest HR: 66 Stress HR: 115  Rest BP: 165/76 Stress BP: 165/76  Exercise Time (min): n/a METS: n/a          Dose of Adenosine (mg):  n/a Dose of Lexiscan: n/a mg  Dose of Atropine (mg): n/a Dose of Dobutamine: n/a mcg/kg/min (at max HR)  Stress Test Technologist: Mellody Memos, CCT Nuclear Technologist: Imagene Riches, CNMT   Rest Procedure:  Myocardial perfusion imaging was performed at rest 45 minutes following the intravenous administration of Technetium 36m Sestamibi. Stress Procedure:  The patient received IV dobutamine and no IV atropine.  There were no significant changes with infusion.  Technetium 55m Sestamibi was injected IV at peak heart rate and quantitative spect images were  obtained after a 45 minute delay.  Transient Ischemic Dilatation (Normal <1.22):  1.09  QGS EDV:  126 ml QGS ESV:  66 ml LV Ejection Fraction: 48%  Rest ECG: NSR-RBBB  Stress ECG: No significant change from baseline ECG  QPS Raw Data Images:  Normal; no motion artifact; normal heart/lung ratio. Stress Images:  Decreased uptake in the high lateral and apical lateral wall Rest Images:  decreased apical uptake Subtraction (SDS):  SDS 8, Extent 21%  Impression Exercise Capacity:  Dobutamine study with no exercise. BP Response:  Normal blood pressure response. Clinical Symptoms:  No significant symptoms noted. ECG Impression:  No significant ST segment change suggestive of ischemia. Comparison with Prior Nuclear Study: Non-ischemic in 2002  Overall Impression:  Intermediate risk stress nuclear study with significant reversible lateral, apical lateral defect which may represent diagonal vessel ischemia.  LV Wall Motion:  Lateral hypokinesis, LVEF 48%.  Pixie Casino, MD, [facility] Board Certified in Nuclear Cardiology Attending Cardiologist [name], MD  12/13/2013 1:02 PM

## 2013-12-12 NOTE — Assessment & Plan Note (Signed)
He did not respond to Spiriva, does not desaturate on ambulation. i agree with Dr Gwenlyn Found that we must consider cardiac cause of his SOB. He has cardiac stress testing today. I will follow with him in 3 months.,

## 2013-12-12 NOTE — Progress Notes (Signed)
Subjective:    Patient ID: Ronald Benitez, male    DOB: 18-Jan-1929, 80 y.o.   MRN: [id number]  Shortness of Breath Associated symptoms include wheezing. Pertinent negatives include no ear pain, fever, headaches, leg swelling, rash, rhinorrhea, sore throat or vomiting.   80 yo former smoker (51 pk-yrs), hx CAD / PTCI, HTN, DM, Parkinson's and hiatal hernia. He is referred for dyspnea by Drs Inda Merlin and Osbourne. He tells me he was well until about a month ago when he developed congestion and productive cough, limiting enough that he has had trouble walking through the house. No chest pain. He was evaluated and found to be hypoxemic, started on exertional O2 by PCP. A CXR was clear, subsequent V/q scan was low prob for PE. His cough has started to improve, but he remains SOB. He otherwise states that he occasionally coughs and has trouble when swallowing.   ROV 11/08/13 -- follows for SOB. He has oxygen to use at night and w exertion, but he has been unable to carry it - too heavy. His breathing is unchanged.   He underwent PFT at [facility] 11/08/13 > mixed disease without BD response, restricted volumes, decrease DLCO.   ROV 12/12/13 -- follows for SOB, COPD, restriction due to obesity. Last time we started spiriva to see if he would benefit.  He has also seen Dr Gwenlyn Found since our visit and is planned for a stress test today.  No benefit at all from [facility], he has had significant constipation on the spiriva, had to start magnesium citrate.   Review of Systems  Constitutional: Negative for fever and unexpected weight change.  HENT: Positive for trouble swallowing ( at times-- choking). Negative for congestion, dental problem, ear pain, nosebleeds, postnasal drip, rhinorrhea, sinus pressure, sneezing and sore throat.   Eyes: Negative for redness and itching.  Respiratory: Positive for shortness of breath and wheezing. Negative for chest tightness.   Cardiovascular: Negative for palpitations and leg  swelling.  Gastrointestinal: Negative for nausea and vomiting.  Genitourinary: Negative for dysuria.  Musculoskeletal: Negative for joint swelling.  Skin: Negative for rash.  Neurological: Negative for headaches.  Hematological: Does not bruise/bleed easily.  Psychiatric/Behavioral: Positive for dysphoric mood. The patient is not nervous/anxious.    Past Medical History  Diagnosis Date  . HTN (hypertension)   . Hyperlipidemia   . DM (diabetes mellitus)   . GERD (gastroesophageal reflux disease)   . CAD (coronary artery disease)   . Depression     meds since 1986  . Chronic kidney disease   . Heart attack   . Shortness of breath      Family History  Problem Relation Age of Onset  . Lung cancer Sister   . COPD Sister   . Heart attack Mother   . Heart disease Father      History   Social History  . Marital Status: Married    Spouse Name: N/A    Number of Children: N/A  . Years of Education: N/A   Occupational History  . retired    Social History Main Topics  . Smoking status: Former Smoker -- 1.50 packs/day    Types: Cigarettes, Cigars    Quit date: 05/07/2013  . Smokeless tobacco: Never Used     Comment: quit cigarettes 1985 1.5 PPD, quit cigars 05/2013-- 5 cigars per day  . Alcohol Use: No  . Drug Use: No  . Sexual Activity: Not on file   Other Topics Concern  . Not  on file   Social History Narrative  . 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   No Known Allergies   Outpatient Prescriptions Prior to Visit  Medication Sig Dispense Refill  . aspirin 325 MG EC tablet Take 325 mg by mouth daily.      . B Complex Vitamins (VITAMIN B COMPLEX PO) Take 1 tablet by mouth every evening.      . captopril (CAPOTEN) 25 MG tablet Take 25 mg by mouth 2 (two) times daily.      . carbidopa-levodopa (SINEMET IR) 25-100 MG per tablet 2 in the AM and at noon, 1 in the  evening  450 tablet  3  . desipramine (NORPRAMIN) 50 MG tablet Take 100 mg by mouth at bedtime.      . finasteride (PROSCAR) 5 MG tablet Take 5 mg by mouth daily.      Marland Kitchen glimepiride (AMARYL) 1 MG tablet Take 1 mg by mouth daily with breakfast.      . Glucosamine-Chondroitin-Vit D3 1500-1200-800 MG-MG-UNIT PACK Take 2 tablets by mouth daily.      Marland Kitchen LORazepam (ATIVAN) 0.5 MG tablet Take 0.5 mg by mouth every 8 (eight) hours.      . metFORMIN (GLUCOPHAGE) 500 MG tablet Take 500 mg by mouth 2 (two) times daily with a meal.      . metoprolol (LOPRESSOR) 50 MG tablet Take 50 mg by mouth 2 (two) times daily.      . Multiple Vitamins-Minerals (ICAPS) CAPS Take 2 capsules by mouth daily.      Marland Kitchen NITROGLYCERIN PO Take 1 tablet by mouth as needed.      . Omega-3 Fatty Acids (FISH OIL) 1000 MG CAPS Take 1,000 mg by mouth daily.      . polyethylene glycol (MIRALAX / GLYCOLAX) packet Take 17 g by mouth daily as needed.      [name] Sodium (STOOL SOFTENER & LAXATIVE PO) Take 2 tablets by mouth daily as needed.      . simvastatin (ZOCOR) 80 MG tablet Take 80 mg by mouth every evening.       . tiotropium (SPIRIVA) 18 MCG inhalation capsule Place 1 capsule (18 mcg total) into inhaler and inhale daily.  30 capsule  6  . vitamin C (ASCORBIC ACID) 500 MG tablet Take 500 mg by mouth daily.      . vitamin E 100 UNIT capsule Take 400 Units by mouth daily.        No facility-administered medications prior to visit.         Objective:   Physical Exam Filed Vitals:   12/12/13 1047  BP: 120/68  Pulse: 79  Height: 6' (1.829 m)  Weight: 184 lb 6.4 oz (83.643 kg)  SpO2: 94%   Gen: Pleasant, well-nourished, in no distress,  normal affect  ENT: No lesions,  mouth clear,  oropharynx clear, no postnasal drip  Neck: No JVD, no TMG, no carotid bruits  Lungs: No use of accessory muscles, no wheezes, some Bibasi;lar insp crackles.   Cardiovascular: RRR, heart sounds normal, no murmur or gallops, no  peripheral edema  Musculoskeletal: No deformities, no cyanosis or clubbing  Neuro: alert, non focal  Skin: Warm, no lesions or rashes     Assessment & Plan:  Dyspnea He did not respond to Spiriva, does not desaturate on ambulation. i agree with Dr Gwenlyn Found that we must  consider cardiac cause of his SOB. He has cardiac stress testing today. I will follow with him in 3 months.,

## 2013-12-20 NOTE — Telephone Encounter (Signed)
Encounter complete. 

## 2014-01-01 ENCOUNTER — Ambulatory Visit: Payer: Medicare Other | Attending: Neurology | Admitting: Physical Therapy

## 2014-01-01 ENCOUNTER — Encounter (HOSPITAL_COMMUNITY): Payer: Self-pay | Admitting: Pharmacy Technician

## 2014-01-01 ENCOUNTER — Ambulatory Visit: Payer: Medicare Other | Admitting: Occupational Therapy

## 2014-01-01 ENCOUNTER — Ambulatory Visit (INDEPENDENT_AMBULATORY_CARE_PROVIDER_SITE_OTHER): Payer: Medicare Other | Admitting: Cardiovascular Disease

## 2014-01-01 ENCOUNTER — Encounter: Payer: Self-pay | Admitting: Cardiovascular Disease

## 2014-01-01 ENCOUNTER — Ambulatory Visit: Payer: Medicare Other | Admitting: Speech Pathology

## 2014-01-01 VITALS — BP 146/70 | HR 53 | Ht 72.0 in | Wt 183.5 lb

## 2014-01-01 DIAGNOSIS — M2420 Disorder of ligament, unspecified site: Secondary | ICD-10-CM | POA: Insufficient documentation

## 2014-01-01 DIAGNOSIS — R9439 Abnormal result of other cardiovascular function study: Secondary | ICD-10-CM

## 2014-01-01 DIAGNOSIS — R5383 Other fatigue: Secondary | ICD-10-CM

## 2014-01-01 DIAGNOSIS — G20 Parkinson's disease: Secondary | ICD-10-CM | POA: Insufficient documentation

## 2014-01-01 DIAGNOSIS — I739 Peripheral vascular disease, unspecified: Secondary | ICD-10-CM

## 2014-01-01 DIAGNOSIS — I10 Essential (primary) hypertension: Secondary | ICD-10-CM

## 2014-01-01 DIAGNOSIS — Z01818 Encounter for other preprocedural examination: Secondary | ICD-10-CM

## 2014-01-01 DIAGNOSIS — R279 Unspecified lack of coordination: Secondary | ICD-10-CM | POA: Insufficient documentation

## 2014-01-01 DIAGNOSIS — R5381 Other malaise: Secondary | ICD-10-CM

## 2014-01-01 DIAGNOSIS — Z79899 Other long term (current) drug therapy: Secondary | ICD-10-CM

## 2014-01-01 DIAGNOSIS — IMO0001 Reserved for inherently not codable concepts without codable children: Secondary | ICD-10-CM | POA: Insufficient documentation

## 2014-01-01 DIAGNOSIS — G20A1 Parkinson's disease without dyskinesia, without mention of fluctuations: Secondary | ICD-10-CM | POA: Insufficient documentation

## 2014-01-01 DIAGNOSIS — R079 Chest pain, unspecified: Secondary | ICD-10-CM

## 2014-01-01 DIAGNOSIS — M629 Disorder of muscle, unspecified: Secondary | ICD-10-CM | POA: Insufficient documentation

## 2014-01-01 DIAGNOSIS — D689 Coagulation defect, unspecified: Secondary | ICD-10-CM

## 2014-01-01 NOTE — Progress Notes (Signed)
01/01/2014 Ronald Benitez   03/12/1929  [id number]  Primary Physician Horton Finer, MD Primary Cardiologist: Lorretta Harp MD [name]   HPI:  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 Nehemiah Settle. He was referred here for evaluation of increasing dyspnea on exertion. His cardiac risk factors  include remote tobacco abuse having smoked 50 pack years and stopped back in 1986. After that he smoked cigars up until this past November. History of diabetes, hypertension, and hyperlipidemia. Both parents died in an elderly age of heart-related issues. He had stents placed as well by Dr. Pernell Dupre in March and November of 1997. He did have recatheterization to 3 years later and was told "everything was okay". He did have nitroglycerin responsive angina but has not taken supplement glycerin since 2003. Over the last year he's had progressive dyspnea on exertion for unclear reasons. He was referred to Dr. Court Joy come up pulmonologist, for pulmonary evaluation as well. A recent Myoview stress test showed inferolateral ischemia a 2-D echo revealed normal LV systolic function with mild to moderate mitral regurgitation. Based on this, I am going to proceed with outpatient cardiac catheterization via the right radial approach.    Current Outpatient Prescriptions  Medication Sig Dispense Refill  . aspirin 325 MG EC tablet Take 325 mg by mouth daily.      . B Complex Vitamins (VITAMIN B COMPLEX PO) Take 1 tablet by mouth every evening.      . captopril (CAPOTEN) 25 MG tablet Take 25 mg by mouth 2 (two) times daily.      . carbidopa-levodopa (SINEMET IR) 25-100 MG per tablet 2 in the AM and at noon, 1 in the evening  450 tablet  3  . desipramine (NORPRAMIN) 50 MG tablet Take 100 mg by mouth at bedtime.      . finasteride (PROSCAR) 5 MG tablet Take 5 mg by mouth daily.       Marland Kitchen glimepiride (AMARYL) 1 MG tablet Take 1 mg by mouth daily with breakfast.      . Glucosamine-Chondroitin-Vit D3 1500-1200-800 MG-MG-UNIT PACK Take 2 tablets by mouth daily.      Marland Kitchen LORazepam (ATIVAN) 0.5 MG tablet Take 0.5 mg by mouth every 8 (eight) hours.      . metFORMIN (GLUCOPHAGE) 500 MG tablet Take 500 mg by mouth 2 (two) times daily with a meal.      . metoprolol (LOPRESSOR) 50 MG tablet Take 50 mg by mouth 2 (two) times daily.      . Multiple Vitamins-Minerals (ICAPS) CAPS Take 2 capsules by mouth daily.      Marland Kitchen NITROGLYCERIN PO Take 1 tablet by mouth as needed.      . Omega-3 Fatty Acids (FISH OIL) 1000 MG CAPS Take 1,000 mg by mouth daily.      . polyethylene glycol (MIRALAX / GLYCOLAX) packet Take 17 g by mouth daily as needed.      [name] Sodium (STOOL SOFTENER & LAXATIVE PO) Take 2 tablets by mouth daily as needed.      . simvastatin (ZOCOR) 80 MG tablet Take 80 mg by mouth every evening.       . vitamin C (ASCORBIC ACID) 500 MG tablet Take 500 mg by mouth daily.      . vitamin E 100 UNIT capsule Take 400 Units by mouth daily.  No current facility-administered medications for this visit.    No Known Allergies  History   Social History  . Marital Status: Married    Spouse Name: N/A    Number of Children: N/A  . Years of Education: N/A   Occupational History  . retired    Social History Main Topics  . Smoking status: Former Smoker -- 1.50 packs/day    Types: Cigarettes, Cigars    Quit date: 05/07/2013  . Smokeless tobacco: Never Used     Comment: quit cigarettes 1985 1.5 PPD, quit cigars 05/2013-- 5 cigars per day  . Alcohol Use: No  . Drug Use: No  . Sexual Activity: Not on file   Other Topics Concern  . Not on file   Social History Narrative  . No narrative on file     Review of Systems: General: negative for chills, fever, night sweats or weight changes.  Cardiovascular: negative for chest pain, dyspnea on exertion, edema,  orthopnea, palpitations, paroxysmal nocturnal dyspnea or shortness of breath Dermatological: negative for rash Respiratory: negative for cough or wheezing Urologic: negative for hematuria Abdominal: negative for nausea, vomiting, diarrhea, bright red blood per rectum, melena, or hematemesis Neurologic: negative for visual changes, syncope, or dizziness All other systems reviewed and are otherwise negative except as noted above.    Blood pressure 146/70, pulse 53, height 6' (1.829 m), weight 183 lb 8 oz (83.235 kg).  General appearance: alert and no distress Neck: no adenopathy, no carotid bruit, no JVD, supple, symmetrical, trachea midline and thyroid not enlarged, symmetric, no tenderness/mass/nodules Lungs: clear to auscultation bilaterally Heart: regular rate and rhythm, S1, S2 normal, no murmur, click, rub or gallop Extremities: extremities normal, atraumatic, no cyanosis or edema  EKG not performed today  ASSESSMENT AND PLAN:   Coronary artery disease The patient has a remote history of CAD status post intervention back in 1997. He's had dyspnea on exertion. Recent Myoview stress test showed ischemia in the anterolateral wall. Echo showed normal left ventricular function with mild to moderate MR. Based on this I am going to proceed with cardiac catheterization to find his anatomy.  Hyperlipidemia On statin therapy followed by his PCP  Essential hypertension Controlled on current medications      Lorretta Harp MD [facility] 01/01/2014 9:39 AM

## 2014-01-01 NOTE — Assessment & Plan Note (Signed)
On statin therapy followed by his PCP 

## 2014-01-01 NOTE — Assessment & Plan Note (Signed)
Controlled on current medications 

## 2014-01-01 NOTE — Assessment & Plan Note (Signed)
The patient has a remote history of CAD status post intervention back in 1997. He's had dyspnea on exertion. Recent Myoview stress test showed ischemia in the anterolateral wall. Echo showed normal left ventricular function with mild to moderate MR. Based on this I am going to proceed with cardiac catheterization to find his anatomy.

## 2014-01-01 NOTE — Patient Instructions (Signed)
Your physician has requested that you have a lower extremity arterial duplex. This test is an ultrasound of the arteries in the legs. It looks at arterial blood flow in the legs. Allow one hour for Lower and scans. There are no restrictions or special instructions.  Your physician has requested that you have a cardiac catheterization. Cardiac catheterization is used to diagnose and/or treat various heart conditions. Doctors may recommend this procedure for a number of different reasons. The most common reason is to evaluate chest pain. Chest pain can be a symptom of coronary artery disease (CAD), and cardiac catheterization can show whether plaque is narrowing or blocking your heart's arteries. This procedure is also used to evaluate the valves, as well as measure the blood flow and oxygen levels in different parts of your heart. For further information please visit HugeFiesta[facility][email or web address]. Please follow instruction sheet, as given.  You will have to have blood work prior to your procedure. The blood work can be done no more than 7 days prior to the procedure.  It can be done at any [facility] lab.  There is one downstairs on the first floor of this building and one in the [place] ([address])    Right Radial

## 2014-01-08 ENCOUNTER — Encounter: Payer: Self-pay | Admitting: Cardiovascular Disease

## 2014-01-08 ENCOUNTER — Telehealth: Payer: Self-pay | Admitting: Cardiovascular Disease

## 2014-01-08 LAB — BASIC METABOLIC PANEL
BUN: 29 mg/dL — AB (ref 6–23)
CHLORIDE: 105 meq/L (ref 96–112)
CO2: 24 mEq/L (ref 19–32)
Calcium: 9.8 mg/dL (ref 8.4–10.5)
Creat: 2.24 mg/dL — ABNORMAL HIGH (ref 0.50–1.35)
Glucose, Bld: 117 mg/dL — ABNORMAL HIGH (ref 70–99)
POTASSIUM: 5.4 meq/L — AB (ref 3.5–5.3)
SODIUM: 138 meq/L (ref 135–145)

## 2014-01-08 LAB — PROTIME-INR
INR: 1.06 (ref <1.50)
Prothrombin Time: 13.8 seconds (ref 11.6–15.2)

## 2014-01-08 LAB — APTT: aPTT: 41 seconds — ABNORMAL HIGH (ref 24–37)

## 2014-01-08 LAB — CBC
HCT: 36.6 % — ABNORMAL LOW (ref 39.0–52.0)
Hemoglobin: 12.5 g/dL — ABNORMAL LOW (ref 13.0–17.0)
MCH: 31.3 pg (ref 26.0–34.0)
MCHC: 34.2 g/dL (ref 30.0–36.0)
MCV: 91.7 fL (ref 78.0–100.0)
Platelets: 250 10*3/uL (ref 150–400)
RBC: 3.99 MIL/uL — ABNORMAL LOW (ref 4.22–5.81)
RDW: 15.3 % (ref 11.5–15.5)
WBC: 6.3 10*3/uL (ref 4.0–10.5)

## 2014-01-08 LAB — TSH: TSH: 4.217 u[IU]/mL (ref 0.350–4.500)

## 2014-01-08 NOTE — Telephone Encounter (Signed)
Left message for patient to call so I could notify him of the time change for his cardiac procedure on Tuesday 01/15/14.

## 2014-01-08 NOTE — Telephone Encounter (Signed)
Spoke with Mrs. Lemmons--told her about the time change for her husband's procedure on 01/15/14.  I also told her I would mail a new letter of instructions with the new times.  She voiced her understanding.

## 2014-01-10 ENCOUNTER — Ambulatory Visit (HOSPITAL_COMMUNITY)
Admission: RE | Admit: 2014-01-10 | Discharge: 2014-01-10 | Disposition: A | Discharge status: Home / Self Care | Payer: Medicare Other | Source: Ambulatory Visit | Attending: Cardiovascular Disease | Admitting: Cardiovascular Disease

## 2014-01-10 DIAGNOSIS — I739 Peripheral vascular disease, unspecified: Secondary | ICD-10-CM | POA: Diagnosis not present

## 2014-01-10 NOTE — Progress Notes (Signed)
Lower Extremity Arterial Duplex Completed. °Brianna L Mazza,RVT °

## 2014-01-13 NOTE — Progress Notes (Signed)
With these labs, pt will need to be admitted the day before for hydration prior to cath and hold ARB.  JJB

## 2014-01-14 ENCOUNTER — Encounter (HOSPITAL_COMMUNITY): Payer: Self-pay | Admitting: *Deleted

## 2014-01-14 ENCOUNTER — Inpatient Hospital Stay (HOSPITAL_COMMUNITY)
Admission: RE | Admit: 2014-01-14 | Discharge: 2014-01-17 | DRG: 247 | Disposition: A | Discharge status: Home / Self Care | Payer: Medicare Other | Source: Ambulatory Visit | Attending: Cardiovascular Disease | Admitting: Cardiovascular Disease

## 2014-01-14 ENCOUNTER — Telehealth: Payer: Self-pay | Admitting: Cardiovascular Disease

## 2014-01-14 DIAGNOSIS — R931 Abnormal findings on diagnostic imaging of heart and coronary circulation: Secondary | ICD-10-CM | POA: Diagnosis present

## 2014-01-14 DIAGNOSIS — I2510 Atherosclerotic heart disease of native coronary artery without angina pectoris: Secondary | ICD-10-CM | POA: Diagnosis present

## 2014-01-14 DIAGNOSIS — R001 Bradycardia, unspecified: Secondary | ICD-10-CM | POA: Diagnosis present

## 2014-01-14 DIAGNOSIS — I498 Other specified cardiac arrhythmias: Secondary | ICD-10-CM | POA: Diagnosis not present

## 2014-01-14 DIAGNOSIS — I252 Old myocardial infarction: Secondary | ICD-10-CM

## 2014-01-14 DIAGNOSIS — F329 Major depressive disorder, single episode, unspecified: Secondary | ICD-10-CM | POA: Diagnosis present

## 2014-01-14 DIAGNOSIS — E119 Type 2 diabetes mellitus without complications: Secondary | ICD-10-CM | POA: Diagnosis present

## 2014-01-14 DIAGNOSIS — F3289 Other specified depressive episodes: Secondary | ICD-10-CM | POA: Diagnosis present

## 2014-01-14 DIAGNOSIS — K219 Gastro-esophageal reflux disease without esophagitis: Secondary | ICD-10-CM | POA: Diagnosis present

## 2014-01-14 DIAGNOSIS — I2584 Coronary atherosclerosis due to calcified coronary lesion: Secondary | ICD-10-CM | POA: Diagnosis present

## 2014-01-14 DIAGNOSIS — G20A1 Parkinson's disease without dyskinesia, without mention of fluctuations: Secondary | ICD-10-CM | POA: Diagnosis present

## 2014-01-14 DIAGNOSIS — I340 Nonrheumatic mitral (valve) insufficiency: Secondary | ICD-10-CM | POA: Diagnosis present

## 2014-01-14 DIAGNOSIS — I200 Unstable angina: Secondary | ICD-10-CM

## 2014-01-14 DIAGNOSIS — R0989 Other specified symptoms and signs involving the circulatory and respiratory systems: Secondary | ICD-10-CM | POA: Diagnosis present

## 2014-01-14 DIAGNOSIS — E785 Hyperlipidemia, unspecified: Secondary | ICD-10-CM | POA: Diagnosis present

## 2014-01-14 DIAGNOSIS — I519 Heart disease, unspecified: Secondary | ICD-10-CM | POA: Diagnosis present

## 2014-01-14 DIAGNOSIS — Z79899 Other long term (current) drug therapy: Secondary | ICD-10-CM | POA: Diagnosis not present

## 2014-01-14 DIAGNOSIS — Z9861 Coronary angioplasty status: Secondary | ICD-10-CM

## 2014-01-14 DIAGNOSIS — N183 Chronic kidney disease, stage 3 unspecified: Secondary | ICD-10-CM

## 2014-01-14 DIAGNOSIS — Z7982 Long term (current) use of aspirin: Secondary | ICD-10-CM

## 2014-01-14 DIAGNOSIS — I059 Rheumatic mitral valve disease, unspecified: Secondary | ICD-10-CM | POA: Diagnosis present

## 2014-01-14 DIAGNOSIS — R9439 Abnormal result of other cardiovascular function study: Secondary | ICD-10-CM

## 2014-01-14 DIAGNOSIS — G20 Parkinson's disease: Secondary | ICD-10-CM | POA: Diagnosis present

## 2014-01-14 DIAGNOSIS — Z794 Long term (current) use of insulin: Secondary | ICD-10-CM

## 2014-01-14 DIAGNOSIS — I129 Hypertensive chronic kidney disease with stage 1 through stage 4 chronic kidney disease, or unspecified chronic kidney disease: Secondary | ICD-10-CM | POA: Diagnosis present

## 2014-01-14 DIAGNOSIS — R0609 Other forms of dyspnea: Secondary | ICD-10-CM | POA: Diagnosis present

## 2014-01-14 DIAGNOSIS — E1129 Type 2 diabetes mellitus with other diabetic kidney complication: Secondary | ICD-10-CM | POA: Diagnosis present

## 2014-01-14 DIAGNOSIS — Z87891 Personal history of nicotine dependence: Secondary | ICD-10-CM

## 2014-01-14 DIAGNOSIS — R079 Chest pain, unspecified: Secondary | ICD-10-CM

## 2014-01-14 DIAGNOSIS — Z8249 Family history of ischemic heart disease and other diseases of the circulatory system: Secondary | ICD-10-CM

## 2014-01-14 DIAGNOSIS — I10 Essential (primary) hypertension: Secondary | ICD-10-CM

## 2014-01-14 LAB — GLUCOSE, CAPILLARY: GLUCOSE-CAPILLARY: 88 mg/dL (ref 70–99)

## 2014-01-14 LAB — BASIC METABOLIC PANEL
ANION GAP: 14 (ref 5–15)
BUN: 30 mg/dL — ABNORMAL HIGH (ref 6–23)
CALCIUM: 9.2 mg/dL (ref 8.4–10.5)
CO2: 20 meq/L (ref 19–32)
Chloride: 106 mEq/L (ref 96–112)
Creatinine, Ser: 2.02 mg/dL — ABNORMAL HIGH (ref 0.50–1.35)
GFR, EST AFRICAN AMERICAN: 33 mL/min — AB (ref >90)
GFR, EST NON AFRICAN AMERICAN: 29 mL/min — AB (ref >90)
Glucose, Bld: 117 mg/dL — ABNORMAL HIGH (ref 70–99)
Potassium: 4.7 mEq/L (ref 3.7–5.3)
SODIUM: 140 meq/L (ref 137–147)

## 2014-01-14 LAB — CBC
HCT: 34.2 % — ABNORMAL LOW (ref 39.0–52.0)
Hemoglobin: 11.1 g/dL — ABNORMAL LOW (ref 13.0–17.0)
MCH: 30.5 pg (ref 26.0–34.0)
MCHC: 32.5 g/dL (ref 30.0–36.0)
MCV: 94 fL (ref 78.0–100.0)
PLATELETS: 197 10*3/uL (ref 150–400)
RBC: 3.64 MIL/uL — ABNORMAL LOW (ref 4.22–5.81)
RDW: 14.5 % (ref 11.5–15.5)
WBC: 5.7 10*3/uL (ref 4.0–10.5)

## 2014-01-14 LAB — PROTIME-INR
INR: 1.15 (ref 0.00–1.49)
Prothrombin Time: 14.7 seconds (ref 11.6–15.2)

## 2014-01-14 MED ORDER — INSULIN ASPART 100 UNIT/ML ~~LOC~~ SOLN: 0.0000 [IU] | Freq: Every day | SUBCUTANEOUS | Status: DC

## 2014-01-14 MED ORDER — CARBIDOPA-LEVODOPA 25-100 MG PO TABS
1.0000 | ORAL_TABLET | Freq: Every day | ORAL | Status: DC
  Administered 2014-01-14 – 2014-01-15 (×2): 1 via ORAL
  Filled 2014-01-14 (×5): qty 1

## 2014-01-14 MED ORDER — OCUVITE-LUTEIN PO CAPS
2.0000 | ORAL_CAPSULE | Freq: Every day | ORAL | Status: DC
  Filled 2014-01-14 (×3): qty 2

## 2014-01-14 MED ORDER — OMEGA-3-ACID ETHYL ESTERS 1 G PO CAPS
1.0000 g | ORAL_CAPSULE | Freq: Every day | ORAL | Status: DC
Start: 2014-01-15 — End: 2014-01-17
  Administered 2014-01-15 – 2014-01-16 (×2): 1 g via ORAL
  Filled 2014-01-14 (×3): qty 1

## 2014-01-14 MED ORDER — GLIMEPIRIDE 1 MG PO TABS
1.0000 mg | ORAL_TABLET | Freq: Every day | ORAL | Status: DC
  Administered 2014-01-15 – 2014-01-17 (×2): 1 mg via ORAL
  Filled 2014-01-14 (×4): qty 1

## 2014-01-14 MED ORDER — SODIUM CHLORIDE 0.9 % IV SOLN: INTRAVENOUS | Status: DC

## 2014-01-14 MED ORDER — ONDANSETRON HCL 4 MG/2ML IJ SOLN: 4.0000 mg | Freq: Four times a day (QID) | INTRAMUSCULAR | Status: DC | PRN

## 2014-01-14 MED ORDER — METOPROLOL TARTRATE 12.5 MG HALF TABLET
12.5000 mg | ORAL_TABLET | Freq: Two times a day (BID) | ORAL | Status: DC
  Administered 2014-01-15 – 2014-01-17 (×5): 12.5 mg via ORAL
  Filled 2014-01-14 (×7): qty 1

## 2014-01-14 MED ORDER — ACETAMINOPHEN 325 MG PO TABS: 650.0000 mg | ORAL_TABLET | ORAL | Status: DC | PRN

## 2014-01-14 MED ORDER — SODIUM CHLORIDE 0.9 % IV SOLN
INTRAVENOUS | Status: DC
  Administered 2014-01-14 – 2014-01-15 (×2): via INTRAVENOUS

## 2014-01-14 MED ORDER — DESIPRAMINE HCL 50 MG PO TABS
100.0000 mg | ORAL_TABLET | Freq: Every day | ORAL | Status: DC
  Administered 2014-01-14 – 2014-01-16 (×3): 100 mg via ORAL
  Filled 2014-01-14 (×4): qty 2

## 2014-01-14 MED ORDER — POLYETHYLENE GLYCOL 3350 17 G PO PACK
17.0000 g | PACK | Freq: Every day | ORAL | Status: DC | PRN
Start: 2014-01-14 — End: 2014-01-17
  Filled 2014-01-14: qty 1

## 2014-01-14 MED ORDER — ASPIRIN EC 325 MG PO TBEC: 325.0000 mg | DELAYED_RELEASE_TABLET | Freq: Every day | ORAL | Status: DC

## 2014-01-14 MED ORDER — ICAPS PO CAPS: 2.0000 | ORAL_CAPSULE | Freq: Every day | ORAL | Status: DC

## 2014-01-14 MED ORDER — NITROGLYCERIN 0.4 MG SL SUBL: 0.4000 mg | SUBLINGUAL_TABLET | SUBLINGUAL | Status: DC | PRN

## 2014-01-14 MED ORDER — HEPARIN SODIUM (PORCINE) 5000 UNIT/ML IJ SOLN
5000.0000 [IU] | Freq: Three times a day (TID) | INTRAMUSCULAR | Status: DC
  Administered 2014-01-14 – 2014-01-15 (×2): 5000 [IU] via SUBCUTANEOUS
  Filled 2014-01-14 (×5): qty 1

## 2014-01-14 MED ORDER — INSULIN ASPART 100 UNIT/ML ~~LOC~~ SOLN
0.0000 [IU] | Freq: Three times a day (TID) | SUBCUTANEOUS | Status: DC
  Administered 2014-01-16: 2 [IU] via SUBCUTANEOUS

## 2014-01-14 MED ORDER — LORAZEPAM 0.5 MG PO TABS
0.5000 mg | ORAL_TABLET | Freq: Three times a day (TID) | ORAL | Status: DC | PRN
  Administered 2014-01-14 – 2014-01-15 (×2): 0.5 mg via ORAL
  Filled 2014-01-14 (×2): qty 1

## 2014-01-14 MED ORDER — CARBIDOPA-LEVODOPA 25-100 MG PO TABS
2.0000 | ORAL_TABLET | ORAL | Status: DC
  Administered 2014-01-15 – 2014-01-17 (×3): 2 via ORAL
  Filled 2014-01-14 (×8): qty 2

## 2014-01-14 MED ORDER — CARBIDOPA-LEVODOPA 25-100 MG PO TABS: 1.0000 | ORAL_TABLET | Freq: Three times a day (TID) | ORAL | Status: DC

## 2014-01-14 MED ORDER — ATORVASTATIN CALCIUM 40 MG PO TABS
40.0000 mg | ORAL_TABLET | Freq: Every day | ORAL | Status: DC
  Administered 2014-01-14 – 2014-01-16 (×3): 40 mg via ORAL
  Filled 2014-01-14 (×5): qty 1

## 2014-01-14 MED ORDER — ASPIRIN 81 MG PO CHEW
81.0000 mg | CHEWABLE_TABLET | ORAL | Status: AC
  Administered 2014-01-15: 81 mg via ORAL
  Filled 2014-01-14: qty 1

## 2014-01-14 MED ORDER — FINASTERIDE 5 MG PO TABS
5.0000 mg | ORAL_TABLET | Freq: Every day | ORAL | Status: DC
  Administered 2014-01-14 – 2014-01-16 (×3): 5 mg via ORAL
  Filled 2014-01-14 (×4): qty 1

## 2014-01-14 MED ORDER — SENNOSIDES-DOCUSATE SODIUM 8.6-50 MG PO TABS
1.0000 | ORAL_TABLET | Freq: Every evening | ORAL | Status: DC | PRN
Start: 2014-01-14 — End: 2014-01-17
  Filled 2014-01-14: qty 1

## 2014-01-14 NOTE — Telephone Encounter (Signed)
Pt called in stating that Dr. Kennon Holter nurse contacted him and he was returning her call. He also stated that he will be going to the hospital tomorrow for an IV to prep for a heart surgery he will be having soon. Please call  Thanks

## 2014-01-14 NOTE — Telephone Encounter (Signed)
Curt Bears will call the patient back.

## 2014-01-14 NOTE — Progress Notes (Signed)
Subjective:  Ronald Benitez is an 80 yo with hx of CAD ( PCI by Daneen Schick) .  He recently started having some shortness breath with exertion. He was referred to our office and was seen by Dr. Gwenlyn Found.  A Dobutamine stress myoview revealed significant reversible lateral, apical lateral ischemia. His left systolic function was mildly depressed with an EF of 48%. He was scheduled for a cardiac catheterization. Because of his renal insufficiency was admitted tonight for cardiac catheterization tomorrow.  Family history: He has a family history of coronary artery disease. His mother died of a myocardial infarction.   Social history: The patient has a history of smoking. He quit last November. He does not drink alcohol.  Past Medical History  Diagnosis Date  . HTN (hypertension)   . Hyperlipidemia   . DM (diabetes mellitus)   . GERD (gastroesophageal reflux disease)   . CAD (coronary artery disease)   . Depression     meds since 1986  . Chronic kidney disease   . Heart attack   . Shortness of breath    Current Facility-Administered Medications  Medication Dose Route Frequency Provider Last Rate Last Dose  . 0.9 %  sodium chloride infusion   Intravenous Continuous Erlene Quan, PA-C 75 mL/hr at 01/14/14 1145    . acetaminophen (TYLENOL) tablet 650 mg  650 mg Oral Q4H PRN Erlene Quan, PA-C      . [START ON 01/15/2014] aspirin chewable tablet 81 mg  81 mg Oral Pre-Cath Lorretta Harp, MD      . Derrill Memo ON 01/15/2014] aspirin EC tablet 325 mg  325 mg Oral Daily Luke K Kilroy, PA-C      . atorvastatin (LIPITOR) tablet 40 mg  40 mg Oral q1800 Luke K Kilroy, PA-C      . carbidopa-levodopa (SINEMET IR) 25-100 MG per tablet immediate release 1-2 tablet  1-2 tablet Oral TID Erlene Quan, PA-C      . desipramine (NORPRAMIN) tablet 100 mg  100 mg Oral QHS Luke K [place], PA-C      . [START ON 01/15/2014] finasteride (PROSCAR) tablet 5 mg  5 mg Oral Daily Luke K Kilroy, PA-C      . [START ON  01/15/2014] glimepiride (AMARYL) tablet 1 mg  1 mg Oral Q breakfast Luke K Kilroy, PA-C      . heparin injection 5,000 Units  5,000 Units Subcutaneous 3 times per day Erlene Quan, PA-C      . [START ON 01/15/2014] ICAPS CAPS 2 capsule  2 capsule Oral Daily Luke K Kilroy, PA-C      . insulin aspart (novoLOG) injection 0-15 Units  0-15 Units Subcutaneous TID WC Luke K Kilroy, PA-C      . insulin aspart (novoLOG) injection 0-5 Units  0-5 Units Subcutaneous QHS Erlene Quan, PA-C      . LORazepam (ATIVAN) tablet 0.5 mg  0.5 mg Oral Q8H PRN Erlene Quan, PA-C      . metoprolol tartrate (LOPRESSOR) tablet 12.5 mg  12.5 mg Oral BID Luke K Kilroy, PA-C      . nitroGLYCERIN (NITROSTAT) SL tablet 0.4 mg  0.4 mg Sublingual Q5 Min x 3 PRN Doreene Burke [place], PA-C      . [START ON 01/15/2014] omega-3 acid ethyl esters (LOVAZA) capsule 1 g  1 g Oral Daily Luke K Kilroy, PA-C      . ondansetron (ZOFRAN) injection 4 mg  4 mg Intravenous Q6H PRN Lurena Joiner  K Kilroy, PA-C      . polyethylene glycol (MIRALAX / GLYCOLAX) packet 17 g  17 g Oral Daily PRN Erlene Quan, PA-C      . senna-docusate (Senokot-S) tablet 1 tablet  1 tablet Oral QHS PRN Erlene Quan, PA-C        Family History  Problem Relation Age of Onset  . Lung cancer Sister   . COPD Sister   . Heart attack Mother   . Heart disease Father      History   Social History  . Marital Status: Married    Spouse Name: N/A    Number of Children: N/A  . Years of Education: N/A   Occupational History  . retired    Social History Main Topics  . Smoking status: Former Smoker -- 1.50 packs/day    Types: Cigarettes, Cigars    Quit date: 05/07/2013  . Smokeless tobacco: Never Used     Comment: quit cigarettes 1985 1.5 PPD, quit cigars 05/2013-- 5 cigars per day  . Alcohol Use: No  . Drug Use: No  . Sexual Activity: Not on file   Other Topics Concern  . Not on file   Social History Narrative  . No narrative on file     Objective:  Vital Signs in  the last 24 hours: Temp:  [97.6 F (36.4 C)-97.7 F (36.5 C)] 97.7 F (36.5 C) (08/10 1435) Pulse Rate:  [44-45] 44 (08/10 1435) Resp:  [16-18] 18 (08/10 1435) BP: (147-148)/(54-59) 147/54 mmHg (08/10 1435) SpO2:  [98 %-100 %] 100 % (08/10 1435) Weight:  [180 lb 8.9 oz (81.9 kg)] 180 lb 8.9 oz (81.9 kg) (08/10 1119)  Intake/Output from previous day:  Intake/Output Summary (Last 24 hours) at 01/14/14 1629 Last data filed at 01/14/14 1300  Gross per 24 hour  Intake    240 ml  Output      0 ml  Net    240 ml    Physical Exam: General appearance: alert, cooperative and no distress Lungs: clear to auscultation bilaterally Heart: regular rate and rhythm and slow rate-43 Abdomen: soft, non-tender; bowel sounds normal; no masses,  no organomegaly Extremities: no edema, intact pulses   Rate: 43  Rhythm: sinus bradycardia  Lab Results:  Recent Labs  01/14/14 1527  WBC 5.7  HGB 11.1*  PLT 197    Recent Labs  01/14/14 1527  NA 140  K 4.7  CL 106  CO2 20  GLUCOSE 117*  BUN 30*  CREATININE 2.02*   No results found for this basename: TROPONINI, CK, MB,  in the last 72 hours  Recent Labs  01/14/14 1527  INR 1.15    Imaging: No results found.  Cardiac Studies:  Assessment/Plan:   Principal Problem:   Dyspnea on exertion- r/o progression of CAD Active Problems:   Abnormal nuclear cardiac imaging test   CAD S/P PCI 1997   Type II diabetes mellitus with renal manifestations   Chronic renal disease, stage III   Sinus bradycardia- HR 43   Essential hypertension   Hyperlipidemia    PLAN: 80 y/o s/p remote PTCA in 1997- admitted for diagnostic cath with a history increasing dyspnea and fatigue with an intermediate Myoview as an OP. His SCr was 2.2 on his pre op labs and he is admitted today for hydration and cath in AM. I will decrease his beta blocker, (? If bradycardia has anything to do with his symptoms) hold Capoten and Metformin. IVF ordered, EF 50-55%  June 2015.  1. Coronary artery disease: The patient is a history of CAD. We discussed the risks, benefits, and options of cardiac catheterization. The patient and wife Understand and agree to proceed.  2. Chronic renal insufficiency: We'll hydrate the patient well prior to his cardiac catheterization  3. Parkinson's disease:      Ramond Dial., MD, [facility] 01/14/2014, 5:50 PM [address]  [place] Pager [phone]

## 2014-01-14 NOTE — Telephone Encounter (Signed)
I spoke with Ronald Benitez and she says that they were confused, but she is clear now.  They are at the hospital now with Ronald Benitez being admitted.

## 2014-01-14 NOTE — Progress Notes (Signed)
Cardiologist on call [facility] notified of pt's hr being in the 99s. Orders to hold metoprolol. Will continue to monitor the pt. Hoover Brunette, RN

## 2014-01-15 ENCOUNTER — Encounter (HOSPITAL_COMMUNITY)
Admission: RE | Disposition: A | Discharge status: Home / Self Care | Payer: Medicare Other | Source: Ambulatory Visit | Attending: Cardiovascular Disease

## 2014-01-15 DIAGNOSIS — I2510 Atherosclerotic heart disease of native coronary artery without angina pectoris: Principal | ICD-10-CM

## 2014-01-15 DIAGNOSIS — E785 Hyperlipidemia, unspecified: Secondary | ICD-10-CM

## 2014-01-15 DIAGNOSIS — E119 Type 2 diabetes mellitus without complications: Secondary | ICD-10-CM

## 2014-01-15 HISTORY — PX: LEFT HEART CATHETERIZATION WITH CORONARY ANGIOGRAM: SHX5451

## 2014-01-15 LAB — GLUCOSE, CAPILLARY
GLUCOSE-CAPILLARY: 86 mg/dL (ref 70–99)
GLUCOSE-CAPILLARY: 93 mg/dL (ref 70–99)
GLUCOSE-CAPILLARY: 194 mg/dL — AB (ref 70–99)
Glucose-Capillary: 84 mg/dL (ref 70–99)
Glucose-Capillary: 95 mg/dL (ref 70–99)

## 2014-01-15 LAB — CBC
HCT: 32.4 % — ABNORMAL LOW (ref 39.0–52.0)
Hemoglobin: 10.5 g/dL — ABNORMAL LOW (ref 13.0–17.0)
MCH: 30.3 pg (ref 26.0–34.0)
MCHC: 32.4 g/dL (ref 30.0–36.0)
MCV: 93.6 fL (ref 78.0–100.0)
Platelets: 200 10*3/uL (ref 150–400)
RBC: 3.46 MIL/uL — ABNORMAL LOW (ref 4.22–5.81)
RDW: 14.5 % (ref 11.5–15.5)
WBC: 7.6 10*3/uL (ref 4.0–10.5)

## 2014-01-15 LAB — BASIC METABOLIC PANEL
Anion gap: 13 (ref 5–15)
BUN: 31 mg/dL — ABNORMAL HIGH (ref 6–23)
CALCIUM: 8.8 mg/dL (ref 8.4–10.5)
CO2: 21 mEq/L (ref 19–32)
Chloride: 106 mEq/L (ref 96–112)
Creatinine, Ser: 1.86 mg/dL — ABNORMAL HIGH (ref 0.50–1.35)
GFR, EST AFRICAN AMERICAN: 37 mL/min — AB (ref >90)
GFR, EST NON AFRICAN AMERICAN: 32 mL/min — AB (ref >90)
Glucose, Bld: 90 mg/dL (ref 70–99)
Potassium: 4.2 mEq/L (ref 3.7–5.3)
SODIUM: 140 meq/L (ref 137–147)

## 2014-01-15 SURGERY — LEFT HEART CATHETERIZATION WITH CORONARY ANGIOGRAM
Anesthesia: LOCAL

## 2014-01-15 MED ORDER — MORPHINE SULFATE 2 MG/ML IJ SOLN: 2.0000 mg | INTRAMUSCULAR | Status: DC | PRN

## 2014-01-15 MED ORDER — MIDAZOLAM HCL 2 MG/2ML IJ SOLN
INTRAMUSCULAR | Status: AC
  Filled 2014-01-15: qty 2

## 2014-01-15 MED ORDER — NITROGLYCERIN IN D5W 200-5 MCG/ML-% IV SOLN
INTRAVENOUS | Status: AC
  Filled 2014-01-15: qty 250

## 2014-01-15 MED ORDER — ASPIRIN 81 MG PO CHEW
81.0000 mg | CHEWABLE_TABLET | Freq: Every day | ORAL | Status: DC
  Administered 2014-01-16: 81 mg via ORAL
  Filled 2014-01-15: qty 1

## 2014-01-15 MED ORDER — ACETAMINOPHEN 325 MG PO TABS: 650.0000 mg | ORAL_TABLET | ORAL | Status: DC | PRN

## 2014-01-15 MED ORDER — SODIUM CHLORIDE 0.9 % IV SOLN
1.0000 mL/kg/h | INTRAVENOUS | Status: DC
  Administered 2014-01-15: 1 mL/kg/h via INTRAVENOUS

## 2014-01-15 MED ORDER — HEPARIN (PORCINE) IN NACL 2-0.9 UNIT/ML-% IJ SOLN
INTRAMUSCULAR | Status: AC
  Filled 2014-01-15: qty 1000

## 2014-01-15 MED ORDER — SODIUM CHLORIDE 0.9 % IJ SOLN: 3.0000 mL | INTRAMUSCULAR | Status: DC | PRN

## 2014-01-15 MED ORDER — ASPIRIN 81 MG PO CHEW: 81.0000 mg | CHEWABLE_TABLET | ORAL | Status: DC

## 2014-01-15 MED ORDER — SODIUM CHLORIDE 0.9 % IV SOLN: INTRAVENOUS | Status: AC

## 2014-01-15 MED ORDER — ONDANSETRON HCL 4 MG/2ML IJ SOLN: 4.0000 mg | Freq: Four times a day (QID) | INTRAMUSCULAR | Status: DC | PRN

## 2014-01-15 MED ORDER — SODIUM CHLORIDE 0.9 % IV SOLN: 250.0000 mL | INTRAVENOUS | Status: DC | PRN

## 2014-01-15 MED ORDER — SODIUM CHLORIDE 0.9 % IJ SOLN
3.0000 mL | Freq: Two times a day (BID) | INTRAMUSCULAR | Status: DC
  Administered 2014-01-15 – 2014-01-16 (×2): 3 mL via INTRAVENOUS

## 2014-01-15 MED ORDER — FENTANYL CITRATE 0.05 MG/ML IJ SOLN
INTRAMUSCULAR | Status: AC
  Filled 2014-01-15: qty 2

## 2014-01-15 MED ORDER — HYDRALAZINE HCL 20 MG/ML IJ SOLN: 10.0000 mg | INTRAMUSCULAR | Status: DC | PRN

## 2014-01-15 MED ORDER — LIDOCAINE HCL (PF) 1 % IJ SOLN
INTRAMUSCULAR | Status: AC
  Filled 2014-01-15: qty 30

## 2014-01-15 NOTE — Progress Notes (Signed)
Pt did not wish to view the heart cath video. Consent signed. Pt will be NPO after midnight. Hoover Brunette, RN

## 2014-01-15 NOTE — Progress Notes (Signed)
80 year old with DOE, + dobutamine stress myoview, now here for hydration prior to cath.  EF 48%.  Subjective: No complaints laying in the bed  Objective: Vital signs in last 24 hours: Temp:  [97.6 F (36.4 C)-98.2 F (36.8 C)] 97.9 F (36.6 C) (08/11 0742) Pulse Rate:  [44-61] 61 (08/11 0742) Resp:  [16-18] 16 (08/11 0742) BP: (132-165)/(48-79) 165/70 mmHg (08/11 0742) SpO2:  [95 %-100 %] 100 % (08/11 0742) Weight:  [180 lb 8.9 oz (81.9 kg)-181 lb 3.2 oz (82.192 kg)] 181 lb 3.2 oz (82.192 kg) (08/11 0400) Weight change:  Last BM Date: 01/13/14 (per pt) Intake/Output from previous day: -220 08/10 0701 - 08/11 0700 In: 480 [P.O.:480] Out: 700 [Urine:700] Intake/Output this shift:    PE: General:Pleasant affect, NAD Skin:Warm and dry, brisk capillary refill HEENT:normocephalic, sclera clear, mucus membranes moist Neck:supple, no JVD  Heart:S1S2 RRR without murmur, gallup, rub or click Lungs: with few crackles om the bases rales, no rhonchi, or wheezes PIR:JJOA, non tender, + BS, do not palpate liver spleen or masses Ext:no lower ext edema, 2+ pedal pulses, 2+ radial pulses Neuro:alert and oriented, MAE, follows commands, + facial symmetry    Lab Results:  Recent Labs  01/14/14 1527 01/15/14 0425  WBC 5.7 7.6  HGB 11.1* 10.5*  HCT 34.2* 32.4*  PLT 197 200   BMET  Recent Labs  01/14/14 1527 01/15/14 0425  NA 140 140  K 4.7 4.2  CL 106 106  CO2 20 21  GLUCOSE 117* 90  BUN 30* 31*  CREATININE 2.02* 1.86*  CALCIUM 9.2 8.8   No results found for this basename: TROPONINI, CK, MB,  in the last 72 hours  No results found for this basename: CHOL,  HDL,  LDLCALC,  LDLDIRECT,  TRIG,  CHOLHDL   No results found for this basename: HGBA1C     Lab Results  Component Value Date   TSH 4.217 01/08/2014     Studies/Results: No results found.  Medications: I have reviewed the patient's current medications. Scheduled Meds: . aspirin  325 mg Oral Daily    . atorvastatin  40 mg Oral q1800  . carbidopa-levodopa  2 tablet Oral 2 times per day   And  . carbidopa-levodopa  1 tablet Oral QHS  . desipramine  100 mg Oral QHS  . finasteride  5 mg Oral Daily  . glimepiride  1 mg Oral Q breakfast  . heparin  5,000 Units Subcutaneous 3 times per day  . insulin aspart  0-15 Units Subcutaneous TID WC  . insulin aspart  0-5 Units Subcutaneous QHS  . metoprolol tartrate  12.5 mg Oral BID  . multivitamin-lutein  2 capsule Oral Daily  . omega-3 acid ethyl esters  1 g Oral Daily   Continuous Infusions: . sodium chloride 75 mL/hr at 01/15/14 0109   PRN Meds:.acetaminophen, LORazepam, nitroGLYCERIN, ondansetron (ZOFRAN) IV, polyethylene glycol, senna-docusate, sodium chloride  Assessment/Plan: Principal Problem:   Dyspnea on exertion- r/o progression of CAD for cardiac cath today Active Problems:   Abnormal nuclear cardiac imaging test- Dobuatamine   Chronic renal disease, stage III-- improved cr.    Type II diabetes mellitus with renal manifestations   Essential hypertension   Hyperlipidemia-on Lipitor 40 mg    CAD S/P PCI 1997   Sinus bradycardia- HR 43    LOS: 1 day   Time spent with pt. :15 minutes. [facility] R  Nurse Practitioner Certified Pager [phone] or after 5pm and on  weekends call [phone] 01/15/2014, 8:05 AM

## 2014-01-15 NOTE — H&P (View-Only) (Signed)
With these labs, pt will need to be admitted the day before for hydration prior to cath and hold ARB.  JJB

## 2014-01-15 NOTE — CV Procedure (Signed)
Ronald Benitez is a 80 y.o. male    [id number] LOCATION:  FACILITY: [place]  PHYSICIAN: Quay Burow, M.D. 09/07/1928   DATE OF PROCEDURE:  01/15/2014  DATE OF DISCHARGE:     CARDIAC CATHETERIZATION     History obtained from chart review.Ronald Benitez is an 79 year old married Caucasian male father of 29, grandfather and 7 grandchildren retired Optometrist. He is a patient of Dr. Nehemiah Settle. He was referred here for evaluation of increasing dyspnea on exertion. His cardiac risk factors include remote tobacco abuse having smoked 50 pack years and stopped back in 1986. After that he smoked cigars up until this past November. History of diabetes, hypertension, and hyperlipidemia. Both parents died in an elderly age of heart-related issues. He had stents placed as well by Dr. Pernell Dupre in March and November of 1997. He did have recatheterization to 3 years later and was told "everything was okay". He did have nitroglycerin responsive angina but has not taken supplement glycerin since 2003. Over the last year he's had progressive dyspnea on exertion for unclear reasons. He was referred to Dr. Court Joy come up pulmonologist, for pulmonary evaluation as well. A recent Myoview stress test showed inferolateral ischemia a 2-D echo revealed normal LV systolic function with mild to moderate mitral regurgitation. Based on this, I am going to proceed with outpatient cardiac catheterization via the right radial approach.     PROCEDURE DESCRIPTION:   The patient was brought to the second floor [place] [facility]iac cath lab in the postabsorptive state. He was not  premedicated . His right groinwas prepped and shaved in usual sterile fashion. Xylocaine 1% was used  for local anesthesia. A 5 French sheath was inserted into the right common femoral artery using standard Seldinger technique. 5 French right and left Judkins diagnostic catheters as well as a 5 French pigtail catheter were used for selective  coronary angiography, left ventriculography respectively.Omnipaque dye was used for the entirety of the case. Retrograde aortic, left ventricular and pullback pressures recorded.  HEMODYNAMICS:    AO SYSTOLIC/AO DIASTOLIC: 810/17   LV SYSTOLIC/LV DIASTOLIC: 510/25  ANGIOGRAPHIC RESULTS:   1. Left main; normal  2. LAD; the first diagonal branch was occluded at its origin and filled faintly by homocollaterals 3. Left circumflex; the stent in the mid AV groove circumflex was widely patent..  4. Right coronary artery; this is a dominant vessel with a patent stent in the midportion. The vessel was fluoroscopically calcified. There were tandem stenoses in the mid and distal portion and a 90-95% range causing 95% proximal to mid PDA lesion. 7. Left ventriculography;performed via hand injection and was grossly normal  IMPRESSION:Ronald Benitez has an occluded moderate size diagonal branch which fills by collaterals faintly, a patent mid AV groove circumflex stent and a patent mid RCA stent with high-grade tandem stenoses in the mid and distal portion. He has moderate renal insufficiency. A total of 60 cc of contrast was administered the patient. This vessel is significantly calcified and probably will require high-speed rotational atherectomy to debulk prior to stenting. The patient received IV hydralazine for blood pressure control followed by an IV nitroglycerin drip. The sheath was then removed and pressure will be held on the groin to achieve hemostasis.  Lorretta Harp MD, [facility] 01/15/2014 3:45 PM

## 2014-01-15 NOTE — Progress Notes (Signed)
Pt. Seen and examined. Agree with the NP/PA-C note as written.  Admitted for pre-hydration. No chest pain. For cath today due to abnormal stress test.   Pixie Casino, MD, [facility] Attending Cardiologist [place]

## 2014-01-15 NOTE — Interval H&P Note (Signed)
Cath Lab Visit (complete for each Cath Lab visit)  Clinical Evaluation Leading to the Procedure:   ACS: No.  Non-ACS:    Anginal Classification: CCS III  Anti-ischemic medical therapy: Minimal Therapy (1 class of medications)  Non-Invasive Test Results: Intermediate-risk stress test findings: cardiac mortality 1-3%/year  Prior CABG: No previous CABG      History and Physical Interval Note:  01/15/2014 3:15 PM  Ronald Benitez  has presented today for surgery, with the diagnosis of cp  The various methods of treatment have been discussed with the patient and family. After consideration of risks, benefits and other options for treatment, the patient has consented to  Procedure(s): LEFT HEART CATHETERIZATION WITH CORONARY ANGIOGRAM (N/A) as a surgical intervention .  The patient's history has been reviewed, patient examined, no change in status, stable for surgery.  I have reviewed the patient's chart and labs.  Questions were answered to the patient's satisfaction.     Lorretta Harp

## 2014-01-16 ENCOUNTER — Encounter (HOSPITAL_COMMUNITY)
Admission: RE | Disposition: A | Discharge status: Home / Self Care | Payer: Self-pay | Source: Ambulatory Visit | Attending: Cardiovascular Disease

## 2014-01-16 HISTORY — PX: PERCUTANEOUS CORONARY ROTOBLATOR INTERVENTION (PCI-R): SHX5484

## 2014-01-16 LAB — BASIC METABOLIC PANEL
ANION GAP: 14 (ref 5–15)
BUN: 27 mg/dL — AB (ref 6–23)
CHLORIDE: 108 meq/L (ref 96–112)
CO2: 19 mEq/L (ref 19–32)
Calcium: 9 mg/dL (ref 8.4–10.5)
Creatinine, Ser: 1.76 mg/dL — ABNORMAL HIGH (ref 0.50–1.35)
GFR calc Af Amer: 39 mL/min — ABNORMAL LOW (ref >90)
GFR, EST NON AFRICAN AMERICAN: 34 mL/min — AB (ref >90)
Glucose, Bld: 129 mg/dL — ABNORMAL HIGH (ref 70–99)
POTASSIUM: 4.1 meq/L (ref 3.7–5.3)
Sodium: 141 mEq/L (ref 137–147)

## 2014-01-16 LAB — GLUCOSE, CAPILLARY
GLUCOSE-CAPILLARY: 98 mg/dL (ref 70–99)
GLUCOSE-CAPILLARY: 94 mg/dL (ref 70–99)
Glucose-Capillary: 149 mg/dL — ABNORMAL HIGH (ref 70–99)

## 2014-01-16 LAB — POCT ACTIVATED CLOTTING TIME: Activated Clotting Time: 366 seconds

## 2014-01-16 SURGERY — PERCUTANEOUS CORONARY ROTOBLATOR INTERVENTION (PCI-R)
Anesthesia: LOCAL

## 2014-01-16 MED ORDER — SODIUM CHLORIDE 0.9 % IV SOLN
INTRAVENOUS | Status: DC
  Administered 2014-01-16: 22:00:00 via INTRAVENOUS

## 2014-01-16 MED ORDER — ACETAMINOPHEN 325 MG PO TABS: 650.0000 mg | ORAL_TABLET | ORAL | Status: DC | PRN

## 2014-01-16 MED ORDER — LIDOCAINE HCL (PF) 1 % IJ SOLN
INTRAMUSCULAR | Status: AC
  Filled 2014-01-16: qty 30

## 2014-01-16 MED ORDER — FAMOTIDINE IN NACL 20-0.9 MG/50ML-% IV SOLN
INTRAVENOUS | Status: AC
  Filled 2014-01-16: qty 50

## 2014-01-16 MED ORDER — ASPIRIN EC 81 MG PO TBEC
81.0000 mg | DELAYED_RELEASE_TABLET | Freq: Every day | ORAL | Status: DC
  Filled 2014-01-16: qty 1

## 2014-01-16 MED ORDER — HEPARIN SODIUM (PORCINE) 1000 UNIT/ML IJ SOLN
INTRAMUSCULAR | Status: AC
  Filled 2014-01-16: qty 1

## 2014-01-16 MED ORDER — SODIUM CHLORIDE 0.9 % IV SOLN
0.2500 mg/kg/h | INTRAVENOUS | Status: AC
  Filled 2014-01-16: qty 250

## 2014-01-16 MED ORDER — NITROGLYCERIN IN D5W 200-5 MCG/ML-% IV SOLN
2.0000 ug/min | INTRAVENOUS | Status: DC
  Administered 2014-01-16: 40 ug/min via INTRAVENOUS
  Administered 2014-01-17: 20 ug/min via INTRAVENOUS
  Administered 2014-01-17: 10 ug/min via INTRAVENOUS

## 2014-01-16 MED ORDER — NITROGLYCERIN IN D5W 200-5 MCG/ML-% IV SOLN
INTRAVENOUS | Status: AC
Start: 2014-01-16 — End: 2014-01-16
  Filled 2014-01-16: qty 250

## 2014-01-16 MED ORDER — BIVALIRUDIN 250 MG IV SOLR
INTRAVENOUS | Status: AC
  Filled 2014-01-16: qty 250

## 2014-01-16 MED ORDER — CLOPIDOGREL BISULFATE 75 MG PO TABS
75.0000 mg | ORAL_TABLET | Freq: Every day | ORAL | Status: DC
  Administered 2014-01-17: 75 mg via ORAL
  Filled 2014-01-16: qty 1

## 2014-01-16 MED ORDER — ONDANSETRON HCL 4 MG/2ML IJ SOLN: 4.0000 mg | Freq: Four times a day (QID) | INTRAMUSCULAR | Status: DC | PRN

## 2014-01-16 MED ORDER — FENTANYL CITRATE 0.05 MG/ML IJ SOLN
INTRAMUSCULAR | Status: AC
  Filled 2014-01-16: qty 2

## 2014-01-16 MED ORDER — CLOPIDOGREL BISULFATE 300 MG PO TABS
ORAL_TABLET | ORAL | Status: AC
  Filled 2014-01-16: qty 2

## 2014-01-16 MED ORDER — SODIUM CHLORIDE 0.9 % IV SOLN: INTRAVENOUS | Status: AC

## 2014-01-16 MED ORDER — MIDAZOLAM HCL 2 MG/2ML IJ SOLN
INTRAMUSCULAR | Status: AC
  Filled 2014-01-16: qty 2

## 2014-01-16 MED ORDER — HEPARIN (PORCINE) IN NACL 2-0.9 UNIT/ML-% IJ SOLN
INTRAMUSCULAR | Status: AC
  Filled 2014-01-16: qty 1500

## 2014-01-16 MED ORDER — VERAPAMIL HCL 2.5 MG/ML IV SOLN
INTRAVENOUS | Status: AC
  Filled 2014-01-16: qty 4

## 2014-01-16 NOTE — Progress Notes (Signed)
Pt. Seen and examined. Agree with the NP/PA-C note as written.  Plan for rotablation today with Dr. Claiborne Billings. Continue hydration for elevated Scr.  Pixie Casino, MD, [facility] Attending Cardiologist [place]

## 2014-01-16 NOTE — H&P (View-Only) (Signed)
Pt. Seen and examined. Agree with the NP/PA-C note as written.  Plan for rotablation today with Dr. Claiborne Billings. Continue hydration for elevated Scr.  Pixie Casino, MD, [facility] Attending Cardiologist [place]

## 2014-01-16 NOTE — Progress Notes (Signed)
Patient Profile: 80 y/o male with remote h/o tobacco abuse and h/o HTN, HLD,  DM and CAD with AV groove and mid RCA PCI in 1997 referred to [facility] for evaluation of increaseing dyspnea on exertion. Myoview stress test showed inferolateral ischemia. Was admitted for diagnostic LHC with pre-cath hydration, in the setting of renal insufficiency.   LHC: Pt has an occluded moderate size diagonal branch which fills by collaterals faintly, a patent mid AV groove circumflex stent and a patent mid RCA stent with high-grade tandem stenoses in the mid and distal portion.   Plan is for high-speed rotational atherectomy to debulk prior to stenting, today with Dr. Claiborne Billings. Scr today is 1.76.    Subjective: Currently CP free. No complaints.   Objective: Vital signs in last 24 hours: Temp:  [97.9 F (36.6 C)-98.2 F (36.8 C)] 98 F (36.7 C) (08/12 0446) Pulse Rate:  [49-84] 69 (08/12 0446) Resp:  [16-18] 18 (08/12 0446) BP: (129-175)/(51-86) 129/57 mmHg (08/12 0446) SpO2:  [98 %-100 %] 98 % (08/12 0446) Last BM Date: 01/15/14  Intake/Output from previous day:   Intake/Output this shift:    Medications Current Facility-Administered Medications  Medication Dose Route Frequency Provider Last Rate Last Dose  . 0.9 %  sodium chloride infusion  250 mL Intravenous PRN Lorretta Harp, MD      . 0.9 %  sodium chloride infusion  1 mL/kg/hr Intravenous Continuous Lorretta Harp, MD 82.2 mL/hr at 01/15/14 1729 1 mL/kg/hr at 01/15/14 1729  . acetaminophen (TYLENOL) tablet 650 mg  650 mg Oral Q4H PRN Lorretta Harp, MD      . aspirin chewable tablet 81 mg  81 mg Oral Daily Lorretta Harp, MD      . atorvastatin (LIPITOR) tablet 40 mg  40 mg Oral [address] [place], PA-C   40 mg at 01/15/14 1723  . carbidopa-levodopa (SINEMET IR) 25-100 MG per tablet immediate release 2 tablet  2 tablet Oral 2 times per day Lorretta Harp, MD   2 tablet at 01/15/14 1723   And  . carbidopa-levodopa (SINEMET  IR) 25-100 MG per tablet immediate release 1 tablet  1 tablet Oral QHS Lorretta Harp, MD   1 tablet at 01/15/14 2147  . desipramine (NORPRAMIN) tablet 100 mg  100 mg Oral QHS Erlene Quan, PA-C   100 mg at 01/15/14 2146  . finasteride (PROSCAR) tablet 5 mg  5 mg Oral Daily Erlene Quan, PA-C   5 mg at 01/15/14 0932  . glimepiride (AMARYL) tablet 1 mg  1 mg Oral Q breakfast Doreene Burke [place], PA-C   1 mg at 01/15/14 1722  . hydrALAZINE (APRESOLINE) injection 10 mg  10 mg Intravenous Q4H PRN Lorretta Harp, MD      . insulin aspart (novoLOG) injection 0-15 Units  0-15 Units Subcutaneous TID WC Luke K Kilroy, PA-C      . insulin aspart (novoLOG) injection 0-5 Units  0-5 Units Subcutaneous QHS Erlene Quan, PA-C      . LORazepam (ATIVAN) tablet 0.5 mg  0.5 mg Oral Q8H PRN Erlene Quan, PA-C   0.5 mg at 01/15/14 0932  . metoprolol tartrate (LOPRESSOR) tablet 12.5 mg  12.5 mg Oral BID Erlene Quan, PA-C   12.5 mg at 01/15/14 2147  . morphine 2 MG/ML injection 2 mg  2 mg Intravenous Q1H PRN Lorretta Harp, MD      . multivitamin-lutein (OCUVITE-LUTEIN) capsule 2 capsule  2 capsule Oral Daily Lorretta Harp, MD      . nitroGLYCERIN (NITROSTAT) SL tablet 0.4 mg  0.4 mg Sublingual Q5 Min x 3 PRN Doreene Burke [place], PA-C      . omega-3 acid ethyl esters (LOVAZA) capsule 1 g  1 g Oral Daily Doreene Burke [place], PA-C   1 g at 01/15/14 2005  . ondansetron (ZOFRAN) injection 4 mg  4 mg Intravenous Q6H PRN Lorretta Harp, MD      . polyethylene glycol (MIRALAX / GLYCOLAX) packet 17 g  17 g Oral Daily PRN Erlene Quan, PA-C      . senna-docusate (Senokot-S) tablet 1 tablet  1 tablet Oral QHS PRN Erlene Quan, PA-C      . sodium chloride 0.9 % injection 3 mL  3 mL Intravenous Q12H Lorretta Harp, MD   3 mL at 01/15/14 2147  . sodium chloride 0.9 % injection 3 mL  3 mL Intravenous PRN Lorretta Harp, MD        PE: General appearance: alert, cooperative and no distress Lungs: clear to auscultation  bilaterally Heart: regular rate and rhythm Extremities: no LEE Pulses: 2+ and symmetric Skin: warm and dry Neurologic: Grossly normal  Lab Results:   Recent Labs  01/14/14 1527 01/15/14 0425  WBC 5.7 7.6  HGB 11.1* 10.5*  HCT 34.2* 32.4*  PLT 197 200   BMET  Recent Labs  01/14/14 1527 01/15/14 0425 01/16/14 0343  NA 140 140 141  K 4.7 4.2 4.1  CL 106 106 108  CO2 20 21 19   GLUCOSE 117* 90 129*  BUN 30* 31* 27*  CREATININE 2.02* 1.86* 1.76*  CALCIUM 9.2 8.8 9.0   PT/INR  Recent Labs  01/14/14 1527  LABPROT 14.7  INR 1.15      Assessment/Plan    Principal Problem:   Dyspnea on exertion- r/o progression of CAD Active Problems:   Type II diabetes mellitus with renal manifestations   Essential hypertension   Hyperlipidemia   Abnormal nuclear cardiac imaging test   CAD S/P PCI 1997   Chronic renal disease, stage III   Sinus bradycardia- HR 43   CAD (coronary artery disease)  1. CAD:  Pt has an occluded moderate size diagonal branch which fills by collaterals faintly, a patent mid AV groove circumflex stent and a patent mid RCA stent with high-grade tandem stenoses in the mid and distal portion. Plan is for high-speed rotational atherectomy to debulk prior to stenting, today with Dr. Claiborne Billings.   2. Renal Insufficiency: Scr is 1.76 today. Continue pre-cath hydration.     3. HTN: controlled.    LOS: 2 days    Yandiel Bergum M. Rosita [place], PA-C 01/16/2014 7:30 AM

## 2014-01-16 NOTE — CV Procedure (Signed)
Ronald Benitez is a 80 y.o. male    [id number]  [id number] LOCATION:  FACILITY: [place]  PHYSICIAN: Troy Sine, MD, [facility] [birth year]   DATE OF PROCEDURE:  01/16/2014     PERCUTANEOUS CORONARY INTERVENTION  HISTORY:    Ronald Benitez is a 80 y.o. male   PROCEDURE:  PTCI to RCA: High-speed rotational atherectomy with a 1.5 mm burr, Flextome cutting balloon, insertion of 2.5x14 mm Resolute DES stent and a 2.25x12 mm Resolute stent in the mid and distal RCA.  The patient was brought to the [facility] cardiac catherization labaratory in the postabsorptive state. He was premedicated with Versed 1 mg and fentanyl 25 mcg. His  right groin was prepped and shaved in usual sterile fashion. Xylocaine 1% was used for local anesthesia. A 7 French sheath was inserted into the R femoral artery and a 6 French venous sheath was inserted into the right femoral vein.  A transvenous pacemaker was positioned at the RV apex lead.  The right venous sheath.  Due to the anticipated high-speed rotational atherectomy-induced bradycardia.  Initially, a 7 [place] FR4 guide was used.  Bivalirudin bolus plus infusion was administered and Plavix 600 mg was given orally for antiplatelet therapy.  A Rotafloppy wire was advanced down the RCA to the distal vessel.  Rotation arthrectomy was performed with a 1.5 mm burr and 3 runs were made.  Only the lesion in the region of the acute margin was able to be aggressively rotablated as the distal lesion could not be reached by the brother without being in the joint of the guidewire.  The patient became bradycardic with each pass and was paced successfully by the transvenous temporary pacemaker, which had been inserted.  A Flextome 2.25x10 mm cutting balloon was then inserted over a [place] wire.  However, there were significant backup difficulties, and the entire system was removed.  A 6 [place] guide was then inserted, which provided excellent backup.  A prowater wire  was advanced to the distal RCA.  The cutting balloon was unable to reach the lesion that I was able to successfully rotablated, but due to the tight stenosis distally.  This could not cross a distal stenosis.  Several inflations were made at the more proximal lesion.  The cutting balloon was then removed and a [place] Euphora 2.25x12 mm balloon was able to cross the distal stenosis.  Noncompliant balloon dilatation was performed and the stenosis yielded.  Stenting was performed with a 2.25x12 mm Resolute DES stent placed distally and this was deployed up to 2.35 atmospheres.  A Resolute 2.5x14 mm was positioned at the stenosis in the acute margin and this was deployed at 10 and 12 atmospheres.  [place] Euphora 2.75x12 mm balloon was used for post stent dilatation at the 2.5 mm stent and was dilated up to 2.7 mm.  The [place] Euphora 2.25 x 12  balloon was used to post-stent dilatation of the distal stent up to 2.3 mm.  Angiography confirmed an excellent angiographic result with brisk TIMI-3 flow.  All catheters were removed from the patient.  The arterial sheath was sutured in place with plans to continue half dose bivalirudin for 2 hours post procedure.  The patient left the catheterization laboratory feeling well with stable hemodynamics.    HEMODYNAMICS:   Central Aorta: 176/80  .  The patient was treated with IV nitroglycerin and this was titrated up to 40 mcg throughout the procedure.   ANGIOGRAPHY:  At the start of the  procedure, the RCA was a diffusely calcified vessel.  There was a previously placed stent in the midportion of the RCA.  After the acute marginal vessel takeoff,  there was 30% narrowing and then a 95% calcified stenosis at the acute margin.  There was a distal 95% stenosis just proximal to the PDA and posterolateral branch.  Following complex intervention with high-speed rotational atherectomy, cutting balloon, noncompliant balloon dilatations, and ultimate stenting the acute margin 95% stenosis was  reduced to 0% following insertion of a 2.5x14 mm Resolute DES stent post dilated to 2.7 mm in the distal 95% stenosis was reduced to 0% with ultimate insertion of a 2.25x12 mm Resolute DES stent post dilated to 2.30 mm.  IMPRESSION:  Successful complex percutaneous coronary intervention to calcified right coronary artery utilizing high-speed rotational atherectomy, temporary pacemaker insertion, cutting balloon, and noncompliant balloon dilatation, with ultimate stenting of 95% stenoses to 0% with ultimate insertion of a 2.5x14 mm Resolute DES stent post dilated to 2.7 mm and a 2.25x12 mm Resolute DES stent, postdilated to 2.3 mm.  Troy Sine, MD, [facility] 01/16/2014 5:16 PM

## 2014-01-16 NOTE — Interval H&P Note (Signed)
Cath Lab Visit (complete for each Cath Lab visit)  Clinical Evaluation Leading to the Procedure:   ACS: No.  Non-ACS:    Anginal Classification: CCS III  Anti-ischemic medical therapy: Maximal Therapy (2 or more classes of medications)  Non-Invasive Test Results: Intermediate-risk stress test findings: cardiac mortality 1-3%/year  Prior CABG: No previous CABG      History and Physical Interval Note:  01/16/2014 3:03 PM  Cleaster Corin  has presented today for surgery, with the diagnosis of cad  The various methods of treatment have been discussed with the patient and family. After consideration of risks, benefits and other options for treatment, the patient has consented to  Procedure(s): PERCUTANEOUS CORONARY ROTOBLATOR INTERVENTION (PCI-R) (N/A) as a surgical intervention .  The patient's history has been reviewed, patient examined, no change in status, stable for surgery.  I have reviewed the patient's chart and labs.  Questions were answered to the patient's satisfaction.     Inell Mimbs A

## 2014-01-16 NOTE — H&P (View-Only) (Signed)
Patient Profile: 80 y/o male with remote h/o tobacco abuse and h/o HTN, HLD,  DM and CAD with AV groove and mid RCA PCI in 1997 referred to [facility] for evaluation of increaseing dyspnea on exertion. Myoview stress test showed inferolateral ischemia. Was admitted for diagnostic LHC with pre-cath hydration, in the setting of renal insufficiency.   LHC: Pt has an occluded moderate size diagonal branch which fills by collaterals faintly, a patent mid AV groove circumflex stent and a patent mid RCA stent with high-grade tandem stenoses in the mid and distal portion.   Plan is for high-speed rotational atherectomy to debulk prior to stenting, today with Dr. Claiborne Billings. Scr today is 1.76.    Subjective: Currently CP free. No complaints.   Objective: Vital signs in last 24 hours: Temp:  [97.9 F (36.6 C)-98.2 F (36.8 C)] 98 F (36.7 C) (08/12 0446) Pulse Rate:  [49-84] 69 (08/12 0446) Resp:  [16-18] 18 (08/12 0446) BP: (129-175)/(51-86) 129/57 mmHg (08/12 0446) SpO2:  [98 %-100 %] 98 % (08/12 0446) Last BM Date: 01/15/14  Intake/Output from previous day:   Intake/Output this shift:    Medications Current Facility-Administered Medications  Medication Dose Route Frequency Provider Last Rate Last Dose  . 0.9 %  sodium chloride infusion  250 mL Intravenous PRN Lorretta Harp, MD      . 0.9 %  sodium chloride infusion  1 mL/kg/hr Intravenous Continuous Lorretta Harp, MD 82.2 mL/hr at 01/15/14 1729 1 mL/kg/hr at 01/15/14 1729  . acetaminophen (TYLENOL) tablet 650 mg  650 mg Oral Q4H PRN Lorretta Harp, MD      . aspirin chewable tablet 81 mg  81 mg Oral Daily Lorretta Harp, MD      . atorvastatin (LIPITOR) tablet 40 mg  40 mg Oral [address] [place], PA-C   40 mg at 01/15/14 1723  . carbidopa-levodopa (SINEMET IR) 25-100 MG per tablet immediate release 2 tablet  2 tablet Oral 2 times per day Lorretta Harp, MD   2 tablet at 01/15/14 1723   And  . carbidopa-levodopa (SINEMET  IR) 25-100 MG per tablet immediate release 1 tablet  1 tablet Oral QHS Lorretta Harp, MD   1 tablet at 01/15/14 2147  . desipramine (NORPRAMIN) tablet 100 mg  100 mg Oral QHS Erlene Quan, PA-C   100 mg at 01/15/14 2146  . finasteride (PROSCAR) tablet 5 mg  5 mg Oral Daily Erlene Quan, PA-C   5 mg at 01/15/14 0932  . glimepiride (AMARYL) tablet 1 mg  1 mg Oral Q breakfast Doreene Burke [place], PA-C   1 mg at 01/15/14 1722  . hydrALAZINE (APRESOLINE) injection 10 mg  10 mg Intravenous Q4H PRN Lorretta Harp, MD      . insulin aspart (novoLOG) injection 0-15 Units  0-15 Units Subcutaneous TID WC Luke K [place], PA-C      . insulin aspart (novoLOG) injection 0-5 Units  0-5 Units Subcutaneous QHS Erlene Quan, PA-C      . LORazepam (ATIVAN) tablet 0.5 mg  0.5 mg Oral Q8H PRN Erlene Quan, PA-C   0.5 mg at 01/15/14 0932  . metoprolol tartrate (LOPRESSOR) tablet 12.5 mg  12.5 mg Oral BID Erlene Quan, PA-C   12.5 mg at 01/15/14 2147  . morphine 2 MG/ML injection 2 mg  2 mg Intravenous Q1H PRN Lorretta Harp, MD      . multivitamin-lutein (OCUVITE-LUTEIN) capsule 2 capsule  2 capsule Oral Daily Lorretta Harp, MD      . nitroGLYCERIN (NITROSTAT) SL tablet 0.4 mg  0.4 mg Sublingual Q5 Min x 3 PRN Doreene Burke [place], PA-C      . omega-3 acid ethyl esters (LOVAZA) capsule 1 g  1 g Oral Daily Doreene Burke [place], PA-C   1 g at 01/15/14 2005  . ondansetron (ZOFRAN) injection 4 mg  4 mg Intravenous Q6H PRN Lorretta Harp, MD      . polyethylene glycol (MIRALAX / GLYCOLAX) packet 17 g  17 g Oral Daily PRN Erlene Quan, PA-C      . senna-docusate (Senokot-S) tablet 1 tablet  1 tablet Oral QHS PRN Erlene Quan, PA-C      . sodium chloride 0.9 % injection 3 mL  3 mL Intravenous Q12H Lorretta Harp, MD   3 mL at 01/15/14 2147  . sodium chloride 0.9 % injection 3 mL  3 mL Intravenous PRN Lorretta Harp, MD        PE: General appearance: alert, cooperative and no distress Lungs: clear to auscultation  bilaterally Heart: regular rate and rhythm Extremities: no LEE Pulses: 2+ and symmetric Skin: warm and dry Neurologic: Grossly normal  Lab Results:   Recent Labs  01/14/14 1527 01/15/14 0425  WBC 5.7 7.6  HGB 11.1* 10.5*  HCT 34.2* 32.4*  PLT 197 200   BMET  Recent Labs  01/14/14 1527 01/15/14 0425 01/16/14 0343  NA 140 140 141  K 4.7 4.2 4.1  CL 106 106 108  CO2 20 21 19   GLUCOSE 117* 90 129*  BUN 30* 31* 27*  CREATININE 2.02* 1.86* 1.76*  CALCIUM 9.2 8.8 9.0   PT/INR  Recent Labs  01/14/14 1527  LABPROT 14.7  INR 1.15      Assessment/Plan    Principal Problem:   Dyspnea on exertion- r/o progression of CAD Active Problems:   Type II diabetes mellitus with renal manifestations   Essential hypertension   Hyperlipidemia   Abnormal nuclear cardiac imaging test   CAD S/P PCI 1997   Chronic renal disease, stage III   Sinus bradycardia- HR 43   CAD (coronary artery disease)  1. CAD:  Pt has an occluded moderate size diagonal branch which fills by collaterals faintly, a patent mid AV groove circumflex stent and a patent mid RCA stent with high-grade tandem stenoses in the mid and distal portion. Plan is for high-speed rotational atherectomy to debulk prior to stenting, today with Dr. Claiborne Billings.   2. Renal Insufficiency: Scr is 1.76 today. Continue pre-cath hydration.     3. HTN: controlled.    LOS: 2 days    Brittainy M. Rosita Fire, PA-C 01/16/2014 7:30 AM

## 2014-01-16 NOTE — Progress Notes (Signed)
Site area: right groin  Site Prior to Removal:  Level 1  Pressure Applied For 20 MINUTES    Minutes Beginning at 2115  Manual:   Yes.    Patient Status During Pull:  Tolerated well, area tender, pt refused pain med, doesn't even want tylenol  Post Pull Groin Site:  Level 0  Post Pull Instructions Given:  Yes.    Post Pull Pulses Present:  Yes.    Dressing Applied:  Yes.    Comments:  See freq vs flowsheet.

## 2014-01-17 ENCOUNTER — Encounter: Payer: Self-pay | Admitting: Cardiovascular Disease

## 2014-01-17 ENCOUNTER — Encounter (HOSPITAL_COMMUNITY): Payer: Self-pay | Admitting: Physician Assistant

## 2014-01-17 DIAGNOSIS — K219 Gastro-esophageal reflux disease without esophagitis: Secondary | ICD-10-CM | POA: Diagnosis present

## 2014-01-17 DIAGNOSIS — I340 Nonrheumatic mitral (valve) insufficiency: Secondary | ICD-10-CM | POA: Diagnosis present

## 2014-01-17 DIAGNOSIS — I200 Unstable angina: Secondary | ICD-10-CM

## 2014-01-17 DIAGNOSIS — I519 Heart disease, unspecified: Secondary | ICD-10-CM | POA: Diagnosis present

## 2014-01-17 DIAGNOSIS — E119 Type 2 diabetes mellitus without complications: Secondary | ICD-10-CM | POA: Diagnosis present

## 2014-01-17 LAB — CBC
HEMATOCRIT: 28.7 % — AB (ref 39.0–52.0)
HEMOGLOBIN: 9.4 g/dL — AB (ref 13.0–17.0)
MCH: 30.1 pg (ref 26.0–34.0)
MCHC: 32.8 g/dL (ref 30.0–36.0)
MCV: 92 fL (ref 78.0–100.0)
Platelets: 163 10*3/uL (ref 150–400)
RBC: 3.12 MIL/uL — ABNORMAL LOW (ref 4.22–5.81)
RDW: 14.7 % (ref 11.5–15.5)
WBC: 7.1 10*3/uL (ref 4.0–10.5)

## 2014-01-17 LAB — BASIC METABOLIC PANEL
Anion gap: 12 (ref 5–15)
BUN: 23 mg/dL (ref 6–23)
CO2: 19 mEq/L (ref 19–32)
Calcium: 8.5 mg/dL (ref 8.4–10.5)
Chloride: 110 mEq/L (ref 96–112)
Creatinine, Ser: 1.75 mg/dL — ABNORMAL HIGH (ref 0.50–1.35)
GFR calc Af Amer: 40 mL/min — ABNORMAL LOW (ref >90)
GFR calc non Af Amer: 34 mL/min — ABNORMAL LOW (ref >90)
Glucose, Bld: 126 mg/dL — ABNORMAL HIGH (ref 70–99)
Potassium: 4.1 mEq/L (ref 3.7–5.3)
Sodium: 141 mEq/L (ref 137–147)

## 2014-01-17 LAB — GLUCOSE, CAPILLARY: GLUCOSE-CAPILLARY: 111 mg/dL — AB (ref 70–99)

## 2014-01-17 MED ORDER — ASPIRIN 81 MG PO TBEC: 81.0000 mg | DELAYED_RELEASE_TABLET | Freq: Every day | ORAL | Status: DC

## 2014-01-17 MED ORDER — METOPROLOL TARTRATE 25 MG PO TABS: 25.0000 mg | ORAL_TABLET | Freq: Two times a day (BID) | ORAL | Status: DC

## 2014-01-17 MED ORDER — METFORMIN HCL 500 MG PO TABS: 1000.0000 mg | ORAL_TABLET | Freq: Two times a day (BID) | ORAL | Status: DC

## 2014-01-17 MED ORDER — NITROGLYCERIN 0.4 MG SL SUBL: 0.4000 mg | SUBLINGUAL_TABLET | SUBLINGUAL | Status: DC | PRN

## 2014-01-17 MED ORDER — CLOPIDOGREL BISULFATE 75 MG PO TABS: 75.0000 mg | ORAL_TABLET | Freq: Every day | ORAL | Status: DC

## 2014-01-17 MED FILL — Sodium Chloride IV Soln 0.9%: INTRAVENOUS | Qty: 100 | Status: AC

## 2014-01-17 NOTE — Discharge Instructions (Signed)
Groin Site Care Refer to this sheet in the next few weeks. These instructions provide you with information on caring for yourself after your procedure. Your caregiver may also give you more specific instructions. Your treatment has been planned according to current medical practices, but problems sometimes occur. Call your caregiver if you have any problems or questions after your procedure. HOME CARE INSTRUCTIONS  You may shower 24 hours after the procedure. Remove the bandage (dressing) and gently wash the site with plain soap and water. Gently pat the site dry.   Do not apply powder or lotion to the site.   Do not sit in a bathtub, swimming pool, or whirlpool for 5 to 7 days.   No bending, squatting, or lifting anything over 10 pounds (4.5 kg) as directed by your caregiver.   Inspect the site at least twice daily.   Do not drive home if you are discharged the same day of the procedure. Have someone else drive you.   You may drive 24 hours after the procedure unless otherwise instructed by your caregiver.  What to expect:  Any bruising will usually fade within 1 to 2 weeks.   Blood that collects in the tissue (hematoma) may be painful to the touch. It should usually decrease in size and tenderness within 1 to 2 weeks.  SEEK IMMEDIATE MEDICAL CARE IF:  You have unusual pain at the groin site or down the affected leg.   You have redness, warmth, swelling, or pain at the groin site.   You have drainage (other than a small amount of blood on the dressing).   You have chills.   You have a fever or persistent symptoms for more than 72 hours.   You have a fever and your symptoms suddenly get worse.   Your leg becomes pale, cool, tingly, or numb.  You have heavy bleeding from the site. Hold pressure on the site.     PLEASE DO NOT TAKE YOUR METFORMIN UNTIL SAT 01/19/14. YOU MAY THEN RESUME AS NORMAL

## 2014-01-17 NOTE — Discharge Summary (Signed)
See full note this am. cdm 

## 2014-01-17 NOTE — Progress Notes (Signed)
CARDIAC REHAB PHASE I   PRE:  Rate/Rhythm: 84 SR  BP:  Supine:   Sitting: 136/58  Standing:    SaO2:   MODE:  Ambulation: 400 ft   POST:  Rate/Rhythm: 101 ST  BP:  Supine:   Sitting: 155/64  Standing:     SaO2:  0845-0940 Pt walked 400 ft with hand held asst. Gets a little unsteady if he tries to walk too fast. Tolerated well without CP or DOE. Education completed with pt and wife. Pt needed re enforcement on how to take NTG use but he was able to remember through teach back. Thought he could take up to 4 or 5 at an episode. Discussed CRP 2 and pt declined program. Will walk as tolerated with wife. Gave modified ex ed. Briefly reviewed diet tips with diabetes and heart healthy.   Graylon Good, RN BSN  01/17/2014 9:36 AM

## 2014-01-17 NOTE — Discharge Summary (Signed)
Discharge Summary   Patient ID: Ronald Benitez MRN: [id number], DOB/AGE: [birth year] 80 y.o. Admit date: 01/14/2014 D/C date:     01/17/2014  Primary Cardiologist: Dr. Gwenlyn Found   Principal Problem:   Unstable angina Active Problems:   Depressive disorder, not elsewhere classified   Essential hypertension   Hyperlipidemia   Abnormal nuclear cardiac imaging test   Chronic renal disease, stage III   Sinus bradycardia- HR 43   CAD (coronary artery disease)   Mitral regurgitation   GERD (gastroesophageal reflux disease)   DM (diabetes mellitus)   Asymptomatic LV dysfunction   Admission Dates: 01/14/14 -- 01/17/14 Discharge Diagnosis: [place] s/p DES x2 to RCA  HPI: Ronald Benitez is a 80 y.o. male with a history of DM, HTN, HLD, CKD, CAD s/p remote stenting (1997), and remote tobacco abuse (50 pack years) who was admitted to [facility] on 01/14/14 for diagnostic cath with a history increasing dyspnea and fatigue with an intermediate Myoview as an OP.  He is a patient of Dr. Nehemiah Settle and was referred to Dr. Gwenlyn Found for evaluation of increasing dyspnea on exertion. He has a history of remote tobacco abuse having smoked 50 pack years and stopped back in 1986. After that he smoked cigars up until this past 04/23/2023. Both parents died in an elderly age of heart-related issues. He had stents placed as well by Dr. Pernell Dupre in March and 04-22-96. He did have recatheterization to 3 years later and was told "everything was okay". He did have nitroglycerin responsive angina but has not taken any since 2003. Over the last year he has had progressive dyspnea on exertion for unclear reasons. He was referred to Dr. Court Joy come up pulmonologist, for pulmonary evaluation as well. A recent Myoview stress test showed inferolateral ischemia a 2-D echo revealed normal LV systolic function with mild to moderate mitral regurgitation. Due to the history outlined above it was elected to proceed with  outpatient cardiac catheterization to delineate his coronary anatomy.   Hospital Course  Unstable angina- evaluated for DOE -- A Dobutamine stress myoview revealed significant reversible lateral, apical lateral ischemia. His left systolic function was mildly depressed with an EF of 48%.  -- He underwent LHC on 01/15/14 which revealed an occluded moderate size diagonal branch which fills by collaterals faintly, a patent mid AV groove circumflex stent and a patent mid RCA stent with high-grade tandem stenoses in the mid and distal portion. The vessel was significantly calcified though to require high-speed rotational atherectomy to debulk prior to stenting. No intervention was performed. He subsequently underwent a second LHC on 01/16/14 and is now s/p a complex PCI to calcified RCA utilizing high-speed rotational atherectomy, temporary pacemaker insertion, cutting balloon, and noncompliant balloon dilatation, with ultimate stenting with DES x2 to RCA.  -- 2D ECHO: 12/04/2013. LV EF: 50% - 55%; G3DD, mild AR, mod MR, mild RA dilation, increased thickness of atrial septum c/w lipomatous hypertrophy. Small PFO. Present only with provocation from having the patient cough. Agitated saline contrast study showed a very small right-to-left atrial level shunt, in the baseline state.  Renal insufficiency - admitted for pre-cath hydration and ACE held  -- Creat 2.24--> 1.86--> 1.76--> 1.75 (near baseline)  -- Continue to hold ARB. This can be resumed as an outpatient   DM- Metformin held for cardiac cath. Resume >48 hours after contrast dye exposure.   Lethargy- BB was reduced. Previously on metoprolol tart 50mg  po BID. Will discharge on 25mg   BID.  Mitral regurgitation- moderate by 2D ECHO on 12/04/2013.   Dementia- continue sinemet.   The patient has had an uncomplicated hospital course and is recovering well. The femoral catheter site is stable.  He has been seen by Dr. Julianne Handler today and deemed ready for  discharge home. All follow-up appointments have been scheduled. Discharge medications are listed below. His metformin will be held until 01/19/14 ( >48 hours post contrast dye exposure) and his ACE will continue to be held until felt safe to start from a renal standpoint at follow up appointment. She will be discharged on ASA/Plavix, BB, statin and SL NTG. Patient declined CRP 2 program and will walk as tolerated with wife.    Discharge Vitals: Blood pressure 136/58, pulse 77, temperature 97.7 F (36.5 C), temperature source Oral, resp. rate 20, height 6' (1.829 m), weight 182 lb 5.1 oz (82.7 kg), SpO2 97.00%.  Labs: Lab Results  Component Value Date   WBC 7.1 01/17/2014   HGB 9.4* 01/17/2014   HCT 28.7* 01/17/2014   MCV 92.0 01/17/2014   PLT 163 01/17/2014     Recent Labs Lab 01/17/14 0300  NA 141  K 4.1  CL 110  CO2 19  BUN 23  CREATININE 1.75*  CALCIUM 8.5  GLUCOSE 126*     Diagnostic Studies/Procedures   01/15/14       CARDIAC CATHETERIZATION  History obtained from chart review.Ronald Benitez is an 80 year old married Caucasian male father of 42, grandfather and 7 grandchildren retired Optometrist. He is a patient of Dr. Nehemiah Settle. He was referred here for evaluation of increasing dyspnea on exertion. His cardiac risk factors include remote tobacco abuse having smoked 50 pack years and stopped back in 1986. After that he smoked cigars up until this past November. History of diabetes, hypertension, and hyperlipidemia. Both parents died in an elderly age of heart-related issues. He had stents placed as well by Dr. Pernell Dupre in March and November of 1997. He did have recatheterization to 3 years later and was told "everything was okay". He did have nitroglycerin responsive angina but has not taken supplement glycerin since 2003. Over the last year he's had progressive dyspnea on exertion for unclear reasons. He was referred to Dr. Court Joy come up pulmonologist, for pulmonary  evaluation as well. A recent Myoview stress test showed inferolateral ischemia a 2-D echo revealed normal LV systolic function with mild to moderate mitral regurgitation. Based on this, I am going to proceed with outpatient cardiac catheterization via the right radial approach.  PROCEDURE DESCRIPTION:  The patient was brought to the second floor [facility] cath lab in the postabsorptive state. He was not  premedicated . His right groinwas prepped and shaved in usual sterile fashion. Xylocaine 1% was used  for local anesthesia. A 5 French sheath was inserted into the right common femoral artery using standard Seldinger technique. 5 French right and left Judkins diagnostic catheters as well as a 5 French pigtail catheter were used for selective coronary angiography, left ventriculography respectively.Omnipaque dye was used for the entirety of the case. Retrograde aortic, left ventricular and pullback pressures recorded.  HEMODYNAMICS:  AO SYSTOLIC/AO DIASTOLIC: 585/27  LV SYSTOLIC/LV DIASTOLIC: 782/42  ANGIOGRAPHIC RESULTS:  1. Left main; normal  2. LAD; the first diagonal branch was occluded at its origin and filled faintly by homocollaterals  3. Left circumflex; the stent in the mid AV groove circumflex was widely patent..  4. Right coronary artery; this is a dominant vessel with  a patent stent in the midportion. The vessel was fluoroscopically calcified. There were tandem stenoses in the mid and distal portion and a 90-95% range causing 95% proximal to mid PDA lesion.  7. Left ventriculography;performed via hand injection and was grossly normal  IMPRESSION:Ronald Benitez has an occluded moderate size diagonal branch which fills by collaterals faintly, a patent mid AV groove circumflex stent and a patent mid RCA stent with high-grade tandem stenoses in the mid and distal portion. He has moderate renal insufficiency. A total of 60 cc of contrast was administered the patient. This vessel is  significantly calcified and probably will require high-speed rotational atherectomy to debulk prior to stenting. The patient received IV hydralazine for blood pressure control followed by an IV nitroglycerin drip. The sheath was then removed and pressure will be held on the groin to achieve hemostasis.     01/16/14 : PERCUTANEOUS CORONARY INTERVENTION  HISTORY:  Ronald Benitez is a 80 y.o. male  PROCEDURE: PTCI to RCA: High-speed rotational atherectomy with a 1.5 mm burr, Flextome cutting balloon, insertion of 2.5x14 mm Resolute DES stent and a 2.25x12 mm Resolute stent in the mid and distal RCA.  The patient was brought to the [facility] cardiac catherization labaratory in the postabsorptive state. He was premedicated with Versed 1 mg and fentanyl 25 mcg. His right groin was prepped and shaved in usual sterile fashion. Xylocaine 1% was used for local anesthesia. A 7 French sheath was inserted into the R femoral artery and a 6 French venous sheath was inserted into the right femoral vein. A transvenous pacemaker was positioned at the RV apex lead. The right venous sheath. Due to the anticipated high-speed rotational atherectomy-induced bradycardia. Initially, a 7 [place] FR4 guide was used. Bivalirudin bolus plus infusion was administered and Plavix 600 mg was given orally for antiplatelet therapy. A Rotafloppy wire was advanced down the RCA to the distal vessel. Rotation arthrectomy was performed with a 1.5 mm burr and 3 runs were made. Only the lesion in the region of the acute margin was able to be aggressively rotablated as the distal lesion could not be reached by the brother without being in the joint of the guidewire. The patient became bradycardic with each pass and was paced successfully by the transvenous temporary pacemaker, which had been inserted. A Flextome 2.25x10 mm cutting balloon was then inserted over a Prowater wire. However, there were significant backup difficulties, and the entire  system was removed. A 6 [place] guide was then inserted, which provided excellent backup. A prowater wire was advanced to the distal RCA. The cutting balloon was unable to reach the lesion that I was able to successfully rotablated, but due to the tight stenosis distally. This could not cross a distal stenosis. Several inflations were made at the more proximal lesion. The cutting balloon was then removed and a [place] Euphora 2.25x12 mm balloon was able to cross the distal stenosis. Noncompliant balloon dilatation was performed and the stenosis yielded. Stenting was performed with a 2.25x12 mm Resolute DES stent placed distally and this was deployed up to 2.35 atmospheres. A Resolute 2.5x14 mm was positioned at the stenosis in the acute margin and this was deployed at 10 and 12 atmospheres. [place] Euphora 2.75x12 mm balloon was used for post stent dilatation at the 2.5 mm stent and was dilated up to 2.7 mm. The [place] Euphora 2.25 x 12 balloon was used to post-stent dilatation of the distal stent up to 2.3 mm. Angiography confirmed an  excellent angiographic result with brisk TIMI-3 flow. All catheters were removed from the patient. The arterial sheath was sutured in place with plans to continue half dose bivalirudin for 2 hours post procedure. The patient left the catheterization laboratory feeling well with stable hemodynamics.  HEMODYNAMICS:  Central Aorta: 176/80 . The patient was treated with IV nitroglycerin and this was titrated up to 40 mcg throughout the procedure.  ANGIOGRAPHY:  At the start of the procedure, the RCA was a diffusely calcified vessel. There was a previously placed stent in the midportion of the RCA. After the acute marginal vessel takeoff, there was 30% narrowing and then a 95% calcified stenosis at the acute margin. There was a distal 95% stenosis just proximal to the PDA and posterolateral branch. Following complex intervention with high-speed rotational atherectomy, cutting balloon,  noncompliant balloon dilatations, and ultimate stenting the acute margin 95% stenosis was reduced to 0% following insertion of a 2.5x14 mm Resolute DES stent post dilated to 2.7 mm in the distal 95% stenosis was reduced to 0% with ultimate insertion of a 2.25x12 mm Resolute DES stent post dilated to 2.30 mm.  IMPRESSION:  Successful complex percutaneous coronary intervention to calcified right coronary artery utilizing high-speed rotational atherectomy, temporary pacemaker insertion, cutting balloon, and noncompliant balloon dilatation, with ultimate stenting of 95% stenoses to 0% with ultimate insertion of a 2.5x14 mm Resolute DES stent post dilated to 2.7 mm and a 2.25x12 mm Resolute DES stent, postdilated to 2.3 mm.  2D ECHO: 12/04/2013.  LV EF: 50% - 55% Study Conclusions - Left ventricle: E/e&'>15 suggestive of elevated LV filling pressures. The cavity size was normal. There was mild focal basal hypertrophy of the septum. Systolic function was normal. The estimated ejection fraction was in the range of 50% to 55%. Wall motion was normal; there were no regional wall motion abnormalities. There was a reduced contribution of atrial contraction to ventricular filling, due to increased ventricular diastolic pressure or atrial contractile dysfunction. Doppler parameters are consistent with a reversible restrictive pattern, indicative of decreased left ventricular diastolic compliance and/or increased left atrial pressure (grade 3 diastolic dysfunction). - Aortic valve: Moderate calcification. There was mild regurgitation. - Mitral valve: There was mild to moderate regurgitation. - Right atrium: The atrium was mildly dilated. - Atrial septum: There was increased thickness of the septum, consistent with lipomatous hypertrophy. There was a very small patent foramen ovale. Present only with provocation from having the patient cough. Agitated saline contrast study showed a very small  right-to-left atrial level shunt, in the baseline state. This was visible after 7 cardiac cycles. - Pulmonic valve: There was trivial regurgitation, with multiple jets directed eccentrically.    Discharge Medications     Medication List    STOP taking these medications       captopril 25 MG tablet  Commonly known as:  CAPOTEN     NITROGLYCERIN PO  Replaced by:  nitroGLYCERIN 0.4 MG SL tablet      TAKE these medications       aspirin 81 MG EC tablet  Take 1 tablet (81 mg total) by mouth daily.     carbidopa-levodopa 25-100 MG per tablet  Commonly known as:  SINEMET IR  Take 1-2 tablets by mouth 3 (three) times daily. 2 tablets in the morning, 2 tablets at noon and 1 tablet in the evening     clopidogrel 75 MG tablet  Commonly known as:  PLAVIX  Take 1 tablet (75 mg total) by mouth daily with  breakfast.     desipramine 50 MG tablet  Commonly known as:  NORPRAMIN  Take 100 mg by mouth at bedtime.     finasteride 5 MG tablet  Commonly known as:  PROSCAR  Take 5 mg by mouth daily.     Fish Oil 1000 MG Caps  Take 1,000 mg by mouth daily.     glimepiride 1 MG tablet  Commonly known as:  AMARYL  Take 1 mg by mouth daily with breakfast.     Glucosamine-Chondroitin-Vit D3 1500-1200-800 MG-MG-UNIT Pack  Take 2 tablets by mouth daily.     ICAPS Caps  Take 2 capsules by mouth daily.     LORazepam 0.5 MG tablet  Commonly known as:  ATIVAN  Take 0.5 mg by mouth every 8 (eight) hours.     metFORMIN 500 MG tablet  Commonly known as:  GLUCOPHAGE  Take 2 tablets (1,000 mg total) by mouth 2 (two) times daily with a meal.  Start taking on:  01/19/2014     metoprolol tartrate 25 MG tablet  Commonly known as:  LOPRESSOR  Take 1 tablet (25 mg total) by mouth 2 (two) times daily.     nitroGLYCERIN 0.4 MG SL tablet  Commonly known as:  NITROSTAT  Place 1 tablet (0.4 mg total) under the tongue every 5 (five) minutes x 3 doses as needed for chest pain.     polyethylene  glycol packet  Commonly known as:  MIRALAX / GLYCOLAX  Take 17 g by mouth daily as needed for mild constipation.     simvastatin 80 MG tablet  Commonly known as:  ZOCOR  Take 80 mg by mouth every evening.     STOOL SOFTENER & LAXATIVE PO  Take 2 tablets by mouth daily as needed (constipation).     VITAMIN B COMPLEX PO  Take 1 tablet by mouth every evening.     vitamin C 500 MG tablet  Commonly known as:  ASCORBIC ACID  Take 500 mg by mouth daily.     vitamin E 100 UNIT capsule  Take 400 Units by mouth daily.        Disposition   The patient will be discharged in stable condition to home.  Follow-up Information   Follow up with Lorretta Harp, MD. (The office will call to make an appointment with a NP or PA within 1-2 weeks. Please contact the office if you do not hear from them by Friday)    Specialty:  Cardiology   Contact information:   [address] [place] [address] [phone]         Duration of Discharge Encounter: Greater than 30 minutes including physician and PA time.  SignedVertell Limber, Ian Cavey PA-C 01/17/2014, 9:42 AM

## 2014-01-17 NOTE — Progress Notes (Signed)
Pt has slept very little tonight, becoming more restless and mildly confused, pulling at iv's and tele wires.  Wife at bedside.  Reoriented easily.  VSS, NTG gtt being weaned off.  Bed alarm on.  Tele 1st degree HB 70's.  Rt groin level 0, Dressing removed, bandaid applied.  Distal bruising noted & unchanged from previous.

## 2014-01-17 NOTE — Progress Notes (Signed)
Patient Name: Ronald Benitez Date of Encounter: 01/17/2014     Principal Problem:   Dyspnea on exertion- r/o progression of CAD Active Problems:   Type II diabetes mellitus with renal manifestations   Essential hypertension   Hyperlipidemia   Abnormal nuclear cardiac imaging test   CAD S/P PCI 1997   Chronic renal disease, stage III   Sinus bradycardia- HR 43   CAD (coronary artery disease)    SUBJECTIVE  NO CP or SOB  CURRENT MEDS . aspirin EC  81 mg Oral Daily  . atorvastatin  40 mg Oral q1800  . carbidopa-levodopa  2 tablet Oral 2 times per day   And  . carbidopa-levodopa  1 tablet Oral QHS  . clopidogrel  75 mg Oral Q breakfast  . desipramine  100 mg Oral QHS  . finasteride  5 mg Oral Daily  . glimepiride  1 mg Oral Q breakfast  . insulin aspart  0-15 Units Subcutaneous TID WC  . insulin aspart  0-5 Units Subcutaneous QHS  . metoprolol tartrate  12.5 mg Oral BID  . multivitamin-lutein  2 capsule Oral Daily  . omega-3 acid ethyl esters  1 g Oral Daily    OBJECTIVE  Filed Vitals:   01/17/14 0300 01/17/14 0400 01/17/14 0536 01/17/14 0611  BP: 136/77 117/71 135/82 132/61  Pulse:   75   Temp:   97.6 F (36.4 C)   TempSrc:   Axillary   Resp:   20   Height:      Weight:      SpO2:   97%     Intake/Output Summary (Last 24 hours) at 01/17/14 0654 Last data filed at 01/17/14 0537  Gross per 24 hour  Intake 1549.37 ml  Output    675 ml  Net 874.37 ml   Filed Weights   01/14/14 1119 01/15/14 0400 01/17/14 0000  Weight: 180 lb 8.9 oz (81.9 kg) 181 lb 3.2 oz (82.192 kg) 182 lb 5.1 oz (82.7 kg)    PHYSICAL EXAM  General: Pleasant, NAD. Neuro: Alert and oriented X 3. Moves all extremities spontaneously. Psych: Normal affect. HEENT:  Normal  Neck: Supple without bruits or JVD. Lungs:  Resp regular and unlabored, CTA. Heart: RRR no s3, s4, or murmurs. Groin site stable Abdomen: Soft, non-tender, non-distended, BS + x 4.  Extremities: No clubbing,  cyanosis or edema. DP/PT/Radials 2+ and equal bilaterally.  Accessory Clinical Findings  CBC  Recent Labs  01/15/14 0425 01/17/14 0300  WBC 7.6 7.1  HGB 10.5* 9.4*  HCT 32.4* 28.7*  MCV 93.6 92.0  PLT 200 414   Basic Metabolic Panel  Recent Labs  01/16/14 0343 01/17/14 0300  NA 141 141  K 4.1 4.1  CL 108 110  CO2 19 19  GLUCOSE 129* 126*  BUN 27* 23  CREATININE 1.76* 1.75*  CALCIUM 9.0 8.5    TELE  NSR  Radiology/Studies  CARDIAC CATHETERIZATION  History obtained from chart review.[name] is an 80 year old married Caucasian male father of 17, grandfather and 7 grandchildren retired Optometrist. He is a patient of Dr. Nehemiah Settle. He was referred here for evaluation of increasing dyspnea on exertion. His cardiac risk factors include remote tobacco abuse having smoked 50 pack years and stopped back in 1986. After that he smoked cigars up until this past November. History of diabetes, hypertension, and hyperlipidemia. Both parents died in an elderly age of heart-related issues. He had stents placed as well by Dr. Pernell Dupre in March  and November of 1997. He did have recatheterization to 3 years later and was told "everything was okay". He did have nitroglycerin responsive angina but has not taken supplement glycerin since 2003. Over the last year he's had progressive dyspnea on exertion for unclear reasons. He was referred to Dr. Court Joy come up pulmonologist, for pulmonary evaluation as well. A recent Myoview stress test showed inferolateral ischemia a 2-D echo revealed normal LV systolic function with mild to moderate mitral regurgitation. Based on this, I am going to proceed with outpatient cardiac catheterization via the right radial approach.  PROCEDURE DESCRIPTION:  The patient was brought to the second floor [facility] cath lab in the postabsorptive state. He was not  premedicated . His right groinwas prepped and shaved in usual sterile fashion.  Xylocaine 1% was used  for local anesthesia. A 5 French sheath was inserted into the right common femoral artery using standard Seldinger technique. 5 French right and left Judkins diagnostic catheters as well as a 5 French pigtail catheter were used for selective coronary angiography, left ventriculography respectively.Omnipaque dye was used for the entirety of the case. Retrograde aortic, left ventricular and pullback pressures recorded.  HEMODYNAMICS:  AO SYSTOLIC/AO DIASTOLIC: 332/95  LV SYSTOLIC/LV DIASTOLIC: 188/41  ANGIOGRAPHIC RESULTS:  1. Left main; normal  2. LAD; the first diagonal branch was occluded at its origin and filled faintly by homocollaterals  3. Left circumflex; the stent in the mid AV groove circumflex was widely patent..  4. Right coronary artery; this is a dominant vessel with a patent stent in the midportion. The vessel was fluoroscopically calcified. There were tandem stenoses in the mid and distal portion and a 90-95% range causing 95% proximal to mid PDA lesion.  7. Left ventriculography;performed via hand injection and was grossly normal  IMPRESSION:[name] has an occluded moderate size diagonal branch which fills by collaterals faintly, a patent mid AV groove circumflex stent and a patent mid RCA stent with high-grade tandem stenoses in the mid and distal portion. He has moderate renal insufficiency. A total of 60 cc of contrast was administered the patient. This vessel is significantly calcified and probably will require high-speed rotational atherectomy to debulk prior to stenting. The patient received IV hydralazine for blood pressure control followed by an IV nitroglycerin drip. The sheath was then removed and pressure will be held on the groin to achieve hemostasis.     PERCUTANEOUS CORONARY INTERVENTION  HISTORY:  OKIE BOGACZ is a 80 y.o. male  PROCEDURE: PTCI to RCA: High-speed rotational atherectomy with a 1.5 mm burr, Flextome cutting balloon,  insertion of 2.5x14 mm Resolute DES stent and a 2.25x12 mm Resolute stent in the mid and distal RCA.  The patient was brought to the [facility] cardiac catherization labaratory in the postabsorptive state. He was premedicated with Versed 1 mg and fentanyl 25 mcg. His right groin was prepped and shaved in usual sterile fashion. Xylocaine 1% was used for local anesthesia. A 7 French sheath was inserted into the R femoral artery and a 6 French venous sheath was inserted into the right femoral vein. A transvenous pacemaker was positioned at the RV apex lead. The right venous sheath. Due to the anticipated high-speed rotational atherectomy-induced bradycardia. Initially, a 7 [place] FR4 guide was used. Bivalirudin bolus plus infusion was administered and Plavix 600 mg was given orally for antiplatelet therapy. A Rotafloppy wire was advanced down the RCA to the distal vessel. Rotation arthrectomy was performed with a 1.5 mm  burr and 3 runs were made. Only the lesion in the region of the acute margin was able to be aggressively rotablated as the distal lesion could not be reached by the brother without being in the joint of the guidewire. The patient became bradycardic with each pass and was paced successfully by the transvenous temporary pacemaker, which had been inserted. A Flextome 2.25x10 mm cutting balloon was then inserted over a Prowater wire. However, there were significant backup difficulties, and the entire system was removed. A 6 [place] guide was then inserted, which provided excellent backup. A prowater wire was advanced to the distal RCA. The cutting balloon was unable to reach the lesion that I was able to successfully rotablated, but due to the tight stenosis distally. This could not cross a distal stenosis. Several inflations were made at the more proximal lesion. The cutting balloon was then removed and a [place] Euphora 2.25x12 mm balloon was able to cross the distal stenosis. Noncompliant balloon  dilatation was performed and the stenosis yielded. Stenting was performed with a 2.25x12 mm Resolute DES stent placed distally and this was deployed up to 2.35 atmospheres. A Resolute 2.5x14 mm was positioned at the stenosis in the acute margin and this was deployed at 10 and 12 atmospheres. [place] Euphora 2.75x12 mm balloon was used for post stent dilatation at the 2.5 mm stent and was dilated up to 2.7 mm. The [place] Euphora 2.25 x 12 balloon was used to post-stent dilatation of the distal stent up to 2.3 mm. Angiography confirmed an excellent angiographic result with brisk TIMI-3 flow. All catheters were removed from the patient. The arterial sheath was sutured in place with plans to continue half dose bivalirudin for 2 hours post procedure. The patient left the catheterization laboratory feeling well with stable hemodynamics.  HEMODYNAMICS:  Central Aorta: 176/80 . The patient was treated with IV nitroglycerin and this was titrated up to 40 mcg throughout the procedure.  ANGIOGRAPHY:  At the start of the procedure, the RCA was a diffusely calcified vessel. There was a previously placed stent in the midportion of the RCA. After the acute marginal vessel takeoff, there was 30% narrowing and then a 95% calcified stenosis at the acute margin. There was a distal 95% stenosis just proximal to the PDA and posterolateral branch. Following complex intervention with high-speed rotational atherectomy, cutting balloon, noncompliant balloon dilatations, and ultimate stenting the acute margin 95% stenosis was reduced to 0% following insertion of a 2.5x14 mm Resolute DES stent post dilated to 2.7 mm in the distal 95% stenosis was reduced to 0% with ultimate insertion of a 2.25x12 mm Resolute DES stent post dilated to 2.30 mm.  IMPRESSION:  Successful complex percutaneous coronary intervention to calcified right coronary artery utilizing high-speed rotational atherectomy, temporary pacemaker insertion, cutting balloon, and  noncompliant balloon dilatation, with ultimate stenting of 95% stenoses to 0% with ultimate insertion of a 2.5x14 mm Resolute DES stent post dilated to 2.7 mm and a 2.25x12 mm Resolute DES stent, postdilated to 2.3 mm.   ASSESSMENT AND PLAN KEISHON CHAVARIN is a 80 y.o. male with a history of DM, HTN, HLD, CKD, CAD s/p remote stenting (1997), and remote tobacco abuse (50 pack years) who was admitted to [facility] on 01/13/14 for diagnostic cath with a history increasing dyspnea and fatigue with an intermediate Myoview as an OP.  CAD-  -- A Dobutamine stress myoview revealed significant reversible lateral, apical lateral ischemia. His left systolic function was mildly depressed with an EF  of 48%. -- He underwent LHC on 01/15/14 which revealed an occluded moderate size diagonal branch which fills by collaterals faintly, a patent mid AV groove circumflex stent and a patent mid RCA stent with high-grade tandem stenoses in the mid and distal portion. The vessel was significantly calcified required high-speed rotational atherectomy to debulk prior to stenting. He subsequently underwent a second LHC on 01/16/14 and now s/p a complex PCI to calcified RCA utilizing high-speed rotational atherectomy, temporary pacemaker insertion, cutting balloon, and noncompliant balloon dilatation, with ultimate stenting of DES x2 to RCA.   Renal insufficiency - admitted for pre-cath hydration and ARB held -- Creat 2.24--> 1.86--> 1.76--> 1.75 (near baseline)   DM- Metformin held for cardiac cath. Resume >48 hours after contrast dye exposure.   Lethargy- BB was reduced.    Tyrell Antonio PA-C  Pager [phone]  I have personally seen and examined this patient with Perry Mount, PA-C. I agree with the assessment and plan as outlined above. He is s/p rotablator atherectomy of the RCA with placement of 2 drug eluting stents in the RCA. Renal function is stable today. Ambulate this am then d/c home. Follow up with Dr. Gwenlyn Found  2-3 weeks.   Zyen Triggs 01/17/2014 7:19 AM

## 2014-02-06 ENCOUNTER — Ambulatory Visit (INDEPENDENT_AMBULATORY_CARE_PROVIDER_SITE_OTHER): Payer: Medicare Other | Admitting: Cardiovascular Disease

## 2014-02-06 ENCOUNTER — Encounter: Payer: Self-pay | Admitting: Cardiovascular Disease

## 2014-02-06 VITALS — BP 166/67 | HR 47 | Ht 71.0 in | Wt 182.0 lb

## 2014-02-06 DIAGNOSIS — E785 Hyperlipidemia, unspecified: Secondary | ICD-10-CM

## 2014-02-06 DIAGNOSIS — I10 Essential (primary) hypertension: Secondary | ICD-10-CM

## 2014-02-06 NOTE — Progress Notes (Signed)
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 Nehemiah Settle. He was referred here for evaluation of increasing dyspnea on exertion. His cardiac risk factors include remote tobacco abuse having smoked 50 pack years and stopped back in 1986. After that he smoked cigars up until this past November. History of diabetes, hypertension, and hyperlipidemia. Both parents died in an elderly age of heart-related issues. He had stents placed as well by Dr. Pernell Dupre in March and November of 1997. He did have recatheterization to 3 years later and was told "everything was okay". He did have nitroglycerin responsive angina but has not taken supplement glycerin since 2003. Over the last year he's had progressive dyspnea on exertion for unclear reasons. He was referred to Dr. Court Joy come up pulmonologist, for pulmonary evaluation as well. A recent Myoview stress test showed inferolateral ischemia a 2-D echo revealed normal LV systolic function with mild to moderate mitral regurgitation. I admitted him for cardiac catheterization which I performed on 8/11 revealing high-grade tandem distal calcified RCA lesions which ultimately were treated with high-speed rotational atherectomy, PCI and stenting the following day by Dr. Ellouise Newer. The symptoms of shortness of breath as have somewhat improved since his procedure. I also performed lower extremity Doppler studies because of some symptoms of claudication revealing ABIs are close to one bilaterally with tibial vessel disease.    Current Outpatient Prescriptions  Medication Sig Dispense Refill  . aspirin EC 81 MG EC tablet Take 1 tablet (81 mg total) by mouth daily.      . B Complex Vitamins  (VITAMIN B COMPLEX PO) Take 1 tablet by mouth every evening.      . captopril (CAPOTEN) 12.5 MG tablet Take 6.25 mg by mouth 2 (two) times daily.       . carbidopa-levodopa (SINEMET IR) 25-100 MG per tablet Take 1-2 tablets by mouth 3 (three) times daily. 2 tablets in the morning, 2 tablets at noon and 1 tablet in the evening      . clopidogrel (PLAVIX) 75 MG tablet Take 1 tablet (75 mg total) by mouth daily with breakfast.  30 tablet  11  . desipramine (NORPRAMIN) 50 MG tablet Take 100 mg by mouth at bedtime.      . finasteride (PROSCAR) 5 MG tablet Take 5 mg by mouth daily.      Marland Kitchen glimepiride (AMARYL) 1 MG tablet Take 1 mg by mouth daily with breakfast.      . Glucosamine-Chondroitin-Vit D3 1500-1200-800 MG-MG-UNIT PACK Take 2 tablets by mouth daily.      Marland Kitchen LORazepam (ATIVAN) 0.5 MG tablet Take 0.5 mg by mouth every 8 (eight) hours.      . metFORMIN (GLUCOPHAGE) 500 MG tablet Take 2 tablets (1,000 mg total) by mouth 2 (two) times daily with a meal.  60 tablet  3  . metoprolol (LOPRESSOR) 25 MG tablet Take 1 tablet (25 mg total) by mouth 2 (two) times daily.  60 tablet  11  . Multiple Vitamins-Minerals (ICAPS) CAPS Take 2 capsules by mouth daily.      . nitroGLYCERIN (NITROSTAT) 0.4 MG SL tablet Place 1 tablet (0.4 mg total) under the tongue every 5 (five) minutes x 3 doses as needed for chest pain.  25  tablet  12  . Omega-3 Fatty Acids (FISH OIL) 1000 MG CAPS Take 1,000 mg by mouth daily.      . polyethylene glycol (MIRALAX / GLYCOLAX) packet Take 17 g by mouth daily as needed for mild constipation.       [name] Sodium (STOOL SOFTENER & LAXATIVE PO) Take 2 tablets by mouth daily as needed (constipation).       . simvastatin (ZOCOR) 80 MG tablet Take 80 mg by mouth every evening.       . vitamin B-12 (CYANOCOBALAMIN) 500 MCG tablet Take 500 mcg by mouth daily.      . vitamin C (ASCORBIC ACID) 500 MG tablet Take 500 mg by mouth daily.      . vitamin E 100 UNIT capsule Take 400 Units  by mouth daily.        No current facility-administered medications for this visit.    No Known Allergies  History   Social History  . Marital Status: Married    Spouse Name: N/A    Number of Children: N/A  . Years of Education: N/A   Occupational History  . retired    Social History Main Topics  . Smoking status: Former Smoker -- 1.50 packs/day    Types: Cigarettes, Cigars    Quit date: 05/07/2013  . Smokeless tobacco: Never Used     Comment: quit cigarettes 1985 1.5 PPD, quit cigars 05/2013-- 5 cigars per day  . Alcohol Use: No  . Drug Use: No  . Sexual Activity: Not on file   Other Topics Concern  . Not on file   Social History Narrative  . No narrative on file     Review of Systems: General: negative for chills, fever, night sweats or weight changes.  Cardiovascular: negative for chest pain, dyspnea on exertion, edema, orthopnea, palpitations, paroxysmal nocturnal dyspnea or shortness of breath Dermatological: negative for rash Respiratory: negative for cough or wheezing Urologic: negative for hematuria Abdominal: negative for nausea, vomiting, diarrhea, bright red blood per rectum, melena, or hematemesis Neurologic: negative for visual changes, syncope, or dizziness All other systems reviewed and are otherwise negative except as noted above.    Blood pressure 166/67, pulse 47, height 5\' 11"  (1.803 m), weight 182 lb (82.555 kg).  General appearance: alert and no distress Neck: no adenopathy, no carotid bruit, no JVD, supple, symmetrical, trachea midline and thyroid not enlarged, symmetric, no tenderness/mass/nodules Lungs: clear to auscultation bilaterally Heart: regular rate and rhythm, S1, S2 normal, no murmur, click, rub or gallop Extremities: extremities normal, atraumatic, no cyanosis or edema and his right femoral arterial puncture site is well-healed  EKG not performed today  ASSESSMENT AND PLAN:   Essential hypertension Mildly elevated on  appropriate medications  Hyperlipidemia On statin therapy followed by his PCP  CAD (coronary artery disease) Status post recent cardiac catheterization performed by myself 01/15/14 after the minute the medical floor for hydration. He had high grade tandem lesions in the mid and distal portion of the RCA which were calcified. His EF was essentially normal. The following day he underwent high-speed rotational atherectomy, PCI and stenting by Dr. Ellouise Newer using drug-eluting stents. His symptoms of shortness of breath has somewhat improved.      Lorretta Harp MD FACP,FACC,FAHA, [facility] 02/06/2014 5:14 PM

## 2014-02-06 NOTE — Assessment & Plan Note (Signed)
Mildly elevated on appropriate medications

## 2014-02-06 NOTE — Patient Instructions (Signed)
We request that you follow-up in: 6 months with Luke Kilroy and in 1 year with Dr Berry  You will receive a reminder letter in the mail two months in advance. If you don't receive a letter, please call our office to schedule the follow-up appointment.   

## 2014-02-06 NOTE — Assessment & Plan Note (Signed)
On statin therapy followed by his PCP 

## 2014-02-06 NOTE — Assessment & Plan Note (Signed)
Status post recent cardiac catheterization performed by myself 01/15/14 after the minute the medical floor for hydration. He had high grade tandem lesions in the mid and distal portion of the RCA which were calcified. His EF was essentially normal. The following day he underwent high-speed rotational atherectomy, PCI and stenting by Dr. Ellouise Newer using drug-eluting stents. His symptoms of shortness of breath has somewhat improved.

## 2014-02-07 ENCOUNTER — Ambulatory Visit: Payer: Medicare Other | Admitting: Cardiology

## 2014-04-09 ENCOUNTER — Ambulatory Visit (INDEPENDENT_AMBULATORY_CARE_PROVIDER_SITE_OTHER): Payer: Medicare Other | Admitting: Emergency Medicine

## 2014-04-09 ENCOUNTER — Encounter: Payer: Self-pay | Admitting: Emergency Medicine

## 2014-04-09 VITALS — BP 132/62 | HR 71 | Temp 96.9°F | Ht 69.0 in | Wt 184.8 lb

## 2014-04-09 DIAGNOSIS — J449 Chronic obstructive pulmonary disease, unspecified: Secondary | ICD-10-CM

## 2014-04-09 NOTE — Progress Notes (Signed)
Subjective:    Patient ID: Ronald Benitez, male    DOB: August 21, 1928, 80 y.o.   MRN: [id number]  Shortness of Breath Pertinent negatives include no ear pain, fever, headaches, leg swelling, rash, rhinorrhea, sore throat, vomiting or wheezing.   80 yo former smoker (4 pk-yrs), hx CAD / PTCI, HTN, DM, Parkinson's and hiatal hernia. He is referred for dyspnea by Drs Inda Merlin and Osbourne. He tells me he was well until about a month ago when he developed congestion and productive cough, limiting enough that he has had trouble walking through the house. No chest pain. He was evaluated and found to be hypoxemic, started on exertional O2 by PCP. A CXR was clear, subsequent V/q scan was low prob for PE. His cough has started to improve, but he remains SOB. He otherwise states that he occasionally coughs and has trouble when swallowing.   ROV 11/08/13 -- follows for SOB. He has oxygen to use at night and w exertion, but he has been unable to carry it - too heavy. His breathing is unchanged.   He underwent PFT at [facility] 11/08/13 > mixed disease without BD response, restricted volumes, decrease DLCO.   ROV 12/12/13 -- follows for SOB, COPD, restriction due to obesity. Last time we started spiriva to see if he would benefit.  He has also seen Dr Gwenlyn Found since our visit and is planned for a stress test today.  No benefit at all from [employer], he has had significant constipation on the spiriva, had to start magnesium citrate.   ROV 04/09/14 -- follow for COPD, restriction and obesity.  He did not benefit from Spiriva.  He has undergone L cath and PTCI >> breathing has improved. He is happy with his functional capacity, does not want to restart BD's at this time.    Review of Systems  Constitutional: Negative for fever and unexpected weight change.  HENT: Negative for congestion, dental problem, ear pain, nosebleeds, postnasal drip, rhinorrhea, sinus pressure, sneezing, sore throat and trouble swallowing ( at times--  choking).   Eyes: Negative for redness and itching.  Respiratory: Negative for chest tightness, shortness of breath and wheezing.   Cardiovascular: Negative for palpitations and leg swelling.  Gastrointestinal: Negative for nausea and vomiting.  Genitourinary: Negative for dysuria.  Musculoskeletal: Negative for joint swelling.  Skin: Negative for rash.  Neurological: Negative for headaches.  Hematological: Does not bruise/bleed easily.  Psychiatric/Behavioral: Negative for dysphoric mood. The patient is not nervous/anxious.       Objective:   Physical Exam Filed Vitals:   04/09/14 0949  BP: 132/62  Pulse: 71  Temp: 96.9 F (36.1 C)  TempSrc: Oral  Height: 5\' 9"  (1.753 m)  Weight: 184 lb 12.8 oz (83.825 kg)  SpO2: 100%   Gen: Pleasant, well-nourished, in no distress,  normal affect  ENT: No lesions,  mouth clear,  oropharynx clear, no postnasal drip  Neck: No JVD, no TMG, no carotid bruits  Lungs: No use of accessory muscles, no wheezes, some Bibasi;lar insp crackles.   Cardiovascular: RRR, heart sounds normal, no murmur or gallops, no peripheral edema  Musculoskeletal: No deformities, no cyanosis or clubbing  Neuro: alert, non focal  Skin: Warm, no lesions or rashes     Assessment & Plan:  COPD, mild He does not complain of any short windedness. His symptoms appear to correlate with his coronary artery disease and/or resolved now that he has had PTCI. I will defer any bronchodilators at this time. He will follow  with me if his breathing worsens

## 2014-04-09 NOTE — Assessment & Plan Note (Signed)
He does not complain of any short windedness. His symptoms appear to correlate with his coronary artery disease and/or resolved now that he has had PTCI. I will defer any bronchodilators at this time. He will follow with me if his breathing worsens

## 2014-04-09 NOTE — Patient Instructions (Signed)
We will not start any inhaled medication at this time.  Follow with Dr Gwenlyn Found as planned Follow with Dr Lamonte Sakai again if your breathing worsens in any way

## 2014-05-07 ENCOUNTER — Other Ambulatory Visit: Payer: Self-pay | Admitting: Internal Medicine

## 2014-05-07 ENCOUNTER — Ambulatory Visit
Admission: RE | Admit: 2014-05-07 | Discharge: 2014-05-07 | Disposition: A | Discharge status: Home / Self Care | Payer: Medicare Other | Source: Ambulatory Visit | Attending: Internal Medicine | Admitting: Internal Medicine

## 2014-05-07 DIAGNOSIS — M25512 Pain in left shoulder: Secondary | ICD-10-CM

## 2014-05-16 ENCOUNTER — Encounter (HOSPITAL_COMMUNITY): Payer: Self-pay | Admitting: Cardiovascular Disease

## 2014-06-17 ENCOUNTER — Ambulatory Visit: Payer: Self-pay

## 2014-06-17 ENCOUNTER — Ambulatory Visit: Payer: Medicare Other | Attending: Internal Medicine

## 2014-06-17 DIAGNOSIS — R279 Unspecified lack of coordination: Secondary | ICD-10-CM | POA: Diagnosis present

## 2014-06-17 NOTE — Therapy (Signed)
[facility] [address] [place], [place], [address] Phone: [phone]   Fax:  [phone]  Physical Therapy Evaluation  Patient Details  Name: Ronald Benitez MRN: [id number] Date of Birth: Aug 03, 1928 Referring Provider:  Horton Finer,*  Encounter Date: 06/17/2014      PT End of Session - 06/17/14 1412    Visit Number 1   Number of Visits 1   PT Start Time 3536   PT Stop Time 1400   PT Time Calculation (min) 42 min      Past Medical History  Diagnosis Date  . HTN (hypertension)   . Hyperlipidemia   . DM (diabetes mellitus)   . GERD (gastroesophageal reflux disease)   . CAD (coronary artery disease)     a. 01/16/14 s/p DES x2 to RCA. b remote RCA stenting 1997   . Depression   . Chronic kidney disease   . Mitral regurgitation     a. mod by 11/2013 ECHO  . Asymptomatic LV dysfunction     a. 2D ECHO on 12/04/2013. LV EF: 50% - 55%; G3DD, mild AR, mod MR, mild RA dilation, increased thickness of atrial septum, c/w lipomatous hypertrophy. Small patent foramen ovale. Present only with provocation from having the patient cough. Agitated saline contrast study showed a very small right-to-left atrial level shunt, in the baseline state.    Past Surgical History  Procedure Laterality Date  . Angioplasty  1997  . Esophagogastroduodenoscopy endoscopy    . Left heart catheterization with coronary angiogram N/A 01/15/2014    Procedure: LEFT HEART CATHETERIZATION WITH CORONARY ANGIOGRAM;  Surgeon: Lorretta Harp, MD;  Location: [facility] C[facility] LAB;  Service: Cardiovascular;  Laterality: N/A;  . Percutaneous coronary rotoblator intervention (pci-r) N/A 01/16/2014    Procedure: PERCUTANEOUS CORONARY ROTOBLATOR INTERVENTION (PCI-R);  Surgeon: Troy Sine, MD;  Location: [facility] CATH LAB;  Service: Cardiovascular;  Laterality: N/A;    There were no vitals taken for this visit.  Visit Diagnosis:  Lack of coordination -  Plan: PT plan of care cert/re-cert      Subjective Assessment - 06/17/14 1324    Symptoms Pt has Parkinson's Disease and was diagnosed in 05/2013. He had a previous episode of therapy at our clinic a year ago, and was referred by his primary carey physician to return. Pt and wife both feel that he has not had any funcitonal decline and they are here because his physician asked that he come before his previously scheduled Parkinson's  screen.    Pertinent History History of CAD and has recently had 2 additional stents in his heart in July 2015.  No complications with the heart since then per pt and wife report.   Patient Stated Goals Pt doesn't feel he needs physical therapy at this time   Currently in Pain? No/denies          [facility] PT Assessment - 06/17/14 0001    Assessment   Medical Diagnosis Parkinson's   Onset Date --  December 2014   Prior Therapy last screen was in September; previous eipsode of therapy was 05/2013   Precautions   Precautions Fall;Other (comment)  severe CAD with stents, monitor vitals PRN   Balance Screen   Has the patient fallen in the past 6 months Yes   How many times? 1  Pt had a freezing episode when rising from chair/turning   Has the patient had a decrease in activity level because of a fear of falling?  No   Is the patient reluctant to leave their home because of a fear of falling?  No   Home Environment   Living Enviornment Private residence   Living Arrangements Spouse/significant other;Children  lives with adult son   Available Help at Discharge Family   Type of [place] to enter   Entrance Stairs-Number of Steps 3   Entrance Stairs-Rails None  "I'll probably put one in this summer"   Home Layout One level   [place] - 2 wheels;Shower seat;Grab bars - tub/shower;Grab bars - toilet   Prior Function   Level of Independence Independent with gait;Independent with transfers  Pt's wife assists with dressing,  limited walking distance   Vocation Retired   Leisure --  previously Personal assistant, unable now   Ambulation/Gait   Ambulation/Gait Yes   Ambulation/Gait Assistance 7: Independent  independent on level indoor surfaces   Ambulation Distance (Feet) 200 Feet   Assistive device None   Gait Pattern Within Functional Limits  no shuffling, pt reports he slows down to prevent festinatio   Gait velocity 2.43 ft/sec   Stairs Yes   Stairs Assistance 6: Modified independent (Device/Increase time)   Stair Management Technique One rail Left   Number of Stairs 4   Standardized Balance Assessment   Standardized Balance Assessment Timed Up and Go Test   Timed Up and Go Test   TUG Normal TUG   Normal TUG (seconds) 9.91  Previous TUG in April 2015 was 14.1 seconds   TUG Comments --  Score improved since pt was discharged in April 2015   Functional Gait  Assessment   Gait assessed  Yes   Gait Level Surface Walks 20 ft in less than 5.5 sec, no assistive devices, good speed, no evidence for imbalance, normal gait pattern, deviates no more than 6 in outside of the 12 in walkway width.   Change in Gait Speed Able to change speed, demonstrates mild gait deviations, deviates 6-10 in outside of the 12 in walkway width, or no gait deviations, unable to achieve a major change in velocity, or uses a change in velocity, or uses an assistive device.   Gait with Horizontal Head Turns Performs head turns with moderate changes in gait velocity, slows down, deviates 10-15 in outside 12 in walkway width but recovers, can continue to walk.   Gait with Vertical Head Turns Performs head turns with no change in gait. Deviates no more than 6 in outside 12 in walkway width.   Gait and Pivot Turn Pivot turns safely within 3 sec and stops quickly with no loss of balance.   Step Over Obstacle Is able to step over 2 stacked shoe boxes taped together (9 in total height) without changing gait speed. No evidence of imbalance.   Gait  with Narrow Base of Support Ambulates 4-7 steps.   Gait with Eyes Closed Walks 20 ft, slow speed, abnormal gait pattern, evidence for imbalance, deviates 10-15 in outside 12 in walkway width. Requires more than 9 sec to ambulate 20 ft.   Ambulating Backwards Walks 20 ft, uses assistive device, slower speed, mild gait deviations, deviates 6-10 in outside 12 in walkway width.   Steps Alternating feet, must use rail.   Total Score 21      5x sit to stand time today: 17.34 seconds (was 19.03 seconds in April 2015).  PT Education - 26-Jun-2014 1411    Education provided Yes   Education Details Comparaison of today's scores on standardized measures with previous episode d/c scores.   Person(s) Educated Patient;Spouse   Methods Explanation   Comprehension Verbalized understanding                    Plan - 2014/06/26 1412    Clinical Impression Statement Pt's standardized measure scores today are all significantly improved compared to previous episode of care. PT not needed at this time. Pt will return in February for previously schedue PT/OT parkinsons disease screen.   PT Next Visit Plan No PT necessary at this time          G-Codes - 06-26-2014 1653    Functional Assessment Tool Used FGA 21/30   Functional Limitation Mobility: Walking and moving around   Mobility: Walking and Moving Around Current Status [phone]) At least 20 percent but less than 40 percent impaired, limited or restricted   Mobility: Walking and Moving Around Goal Status [phone]) At least 20 percent but less than 40 percent impaired, limited or restricted   Mobility: Walking and Moving Around Discharge Status [phone]) At least 20 percent but less than 40 percent impaired, limited or restricted       Problem List Patient Active Problem List   Diagnosis Date Noted  . COPD, mild 04/09/2014  . Unstable angina 01/17/2014  . Mitral regurgitation   . GERD (gastroesophageal reflux  disease)   . DM (diabetes mellitus)   . Asymptomatic LV dysfunction   . CAD (coronary artery disease) 01/15/2014  . Abnormal nuclear cardiac imaging test 01/14/2014  . Chronic renal disease, stage III 01/14/2014  . Sinus bradycardia- HR 43 01/14/2014  . Essential hypertension 11/16/2013  . Hyperlipidemia 11/16/2013  . Depressive disorder, not elsewhere classified 05/08/2013    Delrae Sawyers D 06-26-14, 5:00 PM  [facility] [address] [place], [place], [address] Phone: [phone]   Fax:  [phone]

## 2014-07-30 ENCOUNTER — Ambulatory Visit: Payer: Medicare Other | Admitting: Occupational Therapy

## 2014-07-30 ENCOUNTER — Ambulatory Visit: Payer: Medicare Other | Admitting: Physical Therapy

## 2014-07-30 ENCOUNTER — Ambulatory Visit: Payer: Medicare Other | Attending: Internal Medicine | Admitting: Speech Pathology

## 2014-07-30 DIAGNOSIS — R279 Unspecified lack of coordination: Secondary | ICD-10-CM

## 2014-07-30 DIAGNOSIS — R269 Unspecified abnormalities of gait and mobility: Secondary | ICD-10-CM

## 2014-07-30 DIAGNOSIS — R490 Dysphonia: Secondary | ICD-10-CM

## 2014-07-30 DIAGNOSIS — G20 Parkinson's disease: Secondary | ICD-10-CM

## 2014-07-30 NOTE — Therapy (Signed)
[facility] [address] [place], [place], [address] Phone: [phone]   Fax:  [phone]  Patient Details  Name: Ronald Benitez MRN: [id number] Date of Birth: 08-Nov-1928 Referring Provider:  Horton Finer,*  Encounter Date: 07/30/2014   Occupational Therapy Parkinson's Disease Screen    Physical Performance Test item #4 (donning/doffing jacket):  16.81sec  9-hole peg test:    RUE  33.63sec        LUE  40.25sec  Box & Blocks Test:   RUE  42 blocks        LUE  38 blocks  Change in ability to perform ADLs/IADLs:  None reported  Pt does not require occupation therapy services at this time.  Recommended occupational therapy screen in   approx 6 months.   [name] 07/30/2014, 2:08 PM  [facility] [address] [place], [place], [address] Phone: [phone]   Fax:  [place], OTR/L 07/30/2014 2:08 PM

## 2014-07-30 NOTE — Therapy (Signed)
[facility] [address] [place], [place], [address] Phone: [phone]   Fax:  [phone]  Patient Details  Name: Ronald Benitez MRN: [id number] Date of Birth: 10-11-1928 Referring Provider:  Horton Finer,*  Encounter Date: 07/30/2014   Speech Therapy Parkinson's Disease Screen  Date: 07/30/14    Decibel Level today:72dB  (WNL=70-72 dB) with sound level meter 30cm away from pt's mouth Pt's conversational volume has not changed since last treatment course  Pt has not experienced difficulty in swallowing warranting objective evaluation    Pt does does not require speech therapy services at this time. Recommend ST screen in another 6 months     L[place], Annye Rusk, SLP 07/30/2014, 3:01 PM  [facility] [address] [place], [place], [address] Phone: [phone]   Fax:  [phone]

## 2014-07-31 NOTE — Therapy (Signed)
[facility] [address] [place], [place], [address] Phone: [phone]   Fax:  [phone]  Patient Details  Name: Ronald Benitez MRN: [id number] Date of Birth: [birth year] Referring Provider:  Horton Finer,*  Encounter Date: 07/30/2014  Physical Therapy Parkinson's Disease Screen   Timed Up and Go test:11.92 seconds  10 meter walk test:NA  5 time sit to stand test:13.73 seconds    Patient does not require Physical Therapy services at this time.  Recommend Physical Therapy screen in 6-9 months.   [name] W. 07/31/2014, 1:31 PM  Mady Haagensen, PT 07/31/2014 1:33 PM Phone: [phone] Fax: [facility] [address] [place], [place], [address] Phone: [phone]   Fax:  [phone]

## 2014-12-02 ENCOUNTER — Other Ambulatory Visit: Payer: Self-pay

## 2014-12-06 ENCOUNTER — Other Ambulatory Visit: Payer: Self-pay | Admitting: Neurology

## 2014-12-06 NOTE — Telephone Encounter (Signed)
Carbidopa Levodopa refill requested. Per last office note- patient to remain on medication. Refill approved and sent to patient's pharmacy.   

## 2015-02-12 ENCOUNTER — Encounter: Payer: Medicare Other | Admitting: Physical Therapy

## 2015-02-12 DIAGNOSIS — R269 Unspecified abnormalities of gait and mobility: Secondary | ICD-10-CM

## 2015-02-19 ENCOUNTER — Ambulatory Visit: Payer: Medicare Other | Admitting: Cardiovascular Disease

## 2015-03-06 ENCOUNTER — Ambulatory Visit: Payer: Medicare Other | Admitting: Physical Therapy

## 2015-03-06 ENCOUNTER — Emergency Department (HOSPITAL_COMMUNITY): Payer: Medicare Other

## 2015-03-06 ENCOUNTER — Ambulatory Visit: Payer: Medicare Other | Admitting: Occupational Therapy

## 2015-03-06 ENCOUNTER — Emergency Department (HOSPITAL_COMMUNITY)
Admission: EM | Admit: 2015-03-06 | Discharge: 2015-03-06 | Disposition: A | Discharge status: Home / Self Care | Payer: Medicare Other | Attending: Emergency Medicine | Admitting: Emergency Medicine

## 2015-03-06 ENCOUNTER — Ambulatory Visit: Payer: Medicare Other

## 2015-03-06 ENCOUNTER — Encounter (HOSPITAL_COMMUNITY): Payer: Self-pay

## 2015-03-06 DIAGNOSIS — F329 Major depressive disorder, single episode, unspecified: Secondary | ICD-10-CM | POA: Insufficient documentation

## 2015-03-06 DIAGNOSIS — Z7982 Long term (current) use of aspirin: Secondary | ICD-10-CM | POA: Diagnosis not present

## 2015-03-06 DIAGNOSIS — K219 Gastro-esophageal reflux disease without esophagitis: Secondary | ICD-10-CM | POA: Diagnosis not present

## 2015-03-06 DIAGNOSIS — I2510 Atherosclerotic heart disease of native coronary artery without angina pectoris: Secondary | ICD-10-CM | POA: Diagnosis not present

## 2015-03-06 DIAGNOSIS — K5901 Slow transit constipation: Secondary | ICD-10-CM | POA: Diagnosis not present

## 2015-03-06 DIAGNOSIS — I129 Hypertensive chronic kidney disease with stage 1 through stage 4 chronic kidney disease, or unspecified chronic kidney disease: Secondary | ICD-10-CM | POA: Insufficient documentation

## 2015-03-06 DIAGNOSIS — I340 Nonrheumatic mitral (valve) insufficiency: Secondary | ICD-10-CM | POA: Diagnosis not present

## 2015-03-06 DIAGNOSIS — Z87891 Personal history of nicotine dependence: Secondary | ICD-10-CM | POA: Insufficient documentation

## 2015-03-06 DIAGNOSIS — E119 Type 2 diabetes mellitus without complications: Secondary | ICD-10-CM | POA: Insufficient documentation

## 2015-03-06 DIAGNOSIS — Z9861 Coronary angioplasty status: Secondary | ICD-10-CM | POA: Diagnosis not present

## 2015-03-06 DIAGNOSIS — N189 Chronic kidney disease, unspecified: Secondary | ICD-10-CM | POA: Diagnosis not present

## 2015-03-06 DIAGNOSIS — Z79899 Other long term (current) drug therapy: Secondary | ICD-10-CM | POA: Diagnosis not present

## 2015-03-06 DIAGNOSIS — E785 Hyperlipidemia, unspecified: Secondary | ICD-10-CM | POA: Insufficient documentation

## 2015-03-06 DIAGNOSIS — Z7902 Long term (current) use of antithrombotics/antiplatelets: Secondary | ICD-10-CM | POA: Insufficient documentation

## 2015-03-06 DIAGNOSIS — K5900 Constipation, unspecified: Secondary | ICD-10-CM | POA: Diagnosis present

## 2015-03-06 MED ORDER — LACTULOSE 10 GM/15ML PO SOLN: 30.0000 g | Freq: Three times a day (TID) | ORAL | Status: DC

## 2015-03-06 MED ORDER — LACTULOSE 10 GM/15ML PO SOLN
30.0000 g | Freq: Once | ORAL | Status: AC
  Administered 2015-03-06: 30 g via ORAL
  Filled 2015-03-06: qty 45

## 2015-03-06 NOTE — ED Notes (Addendum)
MD at bedside. 

## 2015-03-06 NOTE — ED Notes (Signed)
Pt c/o constipation x 2 weeks.  Denies pain.  Denies n/v/d.  Pt reports using suppositories, enemas, and Mag Citrate w/o relief.

## 2015-03-06 NOTE — Discharge Instructions (Signed)

## 2015-03-06 NOTE — ED Provider Notes (Signed)
CSN: [id number]     Arrival date & time 03/06/15  9924 History   First MD Initiated Contact with Patient 03/06/15 0740     Chief Complaint  Patient presents with  . Constipation     (Consider location/radiation/quality/duration/timing/severity/associated sxs/prior Treatment) HPI   Ronald Benitez is a 81 y.o. male who presents for evaluation of constipation. He states that he has not had a bowel movement in 2 weeks despite using stool softeners and lack sedatives. He denies nausea, vomiting, fever, chills, weakness or dizziness. He states that he has chronic constipation. There are no other known modifying factors.   Past Medical History  Diagnosis Date  . HTN (hypertension)   . Hyperlipidemia   . DM (diabetes mellitus)   . GERD (gastroesophageal reflux disease)   . CAD (coronary artery disease)     a. 01/16/14 s/p DES x2 to RCA. b remote RCA stenting 1997   . Depression   . Chronic kidney disease   . Mitral regurgitation     a. mod by 11/2013 ECHO  . Asymptomatic LV dysfunction     a. 2D ECHO on 12/04/2013. LV EF: 50% - 55%; G3DD, mild AR, mod MR, mild RA dilation, increased thickness of atrial septum, c/w lipomatous hypertrophy. Small patent foramen ovale. Present only with provocation from having the patient cough. Agitated saline contrast study showed a very small right-to-left atrial level shunt, in the baseline state.   Past Surgical History  Procedure Laterality Date  . Angioplasty  1997  . Esophagogastroduodenoscopy endoscopy    . Left heart catheterization with coronary angiogram N/A 01/15/2014    Procedure: LEFT HEART CATHETERIZATION WITH CORONARY ANGIOGRAM;  Surgeon: Lorretta Harp, MD;  Location[facility] C[facility] LAB;  Service: Cardiovascular;  Laterality: N/A;  . Percutaneous coronary rotoblator intervention (pci-r) N/A 01/16/2014    Procedure: PERCUTANEOUS CORONARY ROTOBLATOR INTERVENTION (PCI-R);  Surgeon: Troy Sine, MD;  Location: [facility] CATH LAB;  Service:  Cardiovascular;  Laterality: N/A;   Family History  Problem Relation Age of Onset  . Lung cancer Sister   . COPD Sister   . Heart attack Mother   . Heart disease Father    Social History  Substance Use Topics  . Smoking status: Former Smoker -- 1.50 packs/day    Types: Cigarettes, Cigars    Quit date: 05/07/2013  . Smokeless tobacco: Never Used     Comment: quit cigarettes 1985 1.5 PPD, quit cigars 05/2013-- 5 cigars per day  . Alcohol Use: No    Review of Systems  All other systems reviewed and are negative.     Allergies  Review of patient's allergies indicates no known allergies.  Home Medications   Prior to Admission medications   Medication Sig Start Date End Date Taking? Authorizing Provider  aspirin EC 81 MG EC tablet Take 1 tablet (81 mg total) by mouth daily. 01/17/14  Yes Eileen Stanford, PA-C  B Complex Vitamins (VITAMIN B COMPLEX PO) Take 1 tablet by mouth every evening.   Yes Historical Provider, MD  captopril (CAPOTEN) 12.5 MG tablet Take 6.25 mg by mouth 2 (two) times daily.  11/05/13  Yes Historical Provider, MD  carbidopa-levodopa (SINEMET IR) 25-100 MG per tablet TAKE 2 BY MOUTH EVERY MORNING AND AT NOON AND TAKE 1 BY MOUTH EVERY EVENING Patient taking differently: TAKE 2 BY MOUTH EVERY MORNING AND 2 tablets  AT NOON AND then TAKE 1 BY MOUTH EVERY EVENING 12/06/14  Yes Rebecca S Tat, DO  clopidogrel (PLAVIX) 75  MG tablet Take 1 tablet (75 mg total) by mouth daily with breakfast. 01/17/14  Yes Eileen Stanford, PA-C  desipramine (NORPRAMIN) 50 MG tablet Take 100 mg by mouth at bedtime.   Yes Historical Provider, MD  finasteride (PROSCAR) 5 MG tablet Take 5 mg by mouth daily. 05/25/13  Yes Historical Provider, MD  glimepiride (AMARYL) 1 MG tablet Take 1 mg by mouth daily with breakfast.   Yes Historical Provider, MD  Glucosamine-Chondroitin-Vit D3 1500-1200-800 MG-MG-UNIT PACK Take 2 tablets by mouth daily.   Yes Historical Provider, MD  LORazepam (ATIVAN) 0.5  MG tablet Take 0.5 mg by mouth every 8 (eight) hours.   Yes Historical Provider, MD  metoprolol (LOPRESSOR) 25 MG tablet Take 1 tablet (25 mg total) by mouth 2 (two) times daily. 01/17/14  Yes Eileen Stanford, PA-C  Multiple Vitamins-Minerals (ICAPS) CAPS Take 2 capsules by mouth daily.   Yes Historical Provider, MD  nitroGLYCERIN (NITROSTAT) 0.4 MG SL tablet Place 1 tablet (0.4 mg total) under the tongue every 5 (five) minutes x 3 doses as needed for chest pain. 01/17/14  Yes Eileen Stanford, PA-C  Omega-3 Fatty Acids (FISH OIL) 1000 MG CAPS Take 1,000 mg by mouth daily.   Yes Historical Provider, MD  polyethylene glycol (MIRALAX / GLYCOLAX) packet Take 17 g by mouth daily as needed for mild constipation.    Yes Historical Provider, MD  Sennosides-Docusate Sodium (STOOL SOFTENER & LAXATIVE PO) Take 2 tablets by mouth daily as needed (constipation).    Yes Historical Provider, MD  simvastatin (ZOCOR) 80 MG tablet Take 80 mg by mouth every evening.    Yes Historical Provider, MD  tamsulosin (FLOMAX) 0.4 MG CAPS capsule Take 0.4 mg by mouth daily. Pt is taking for a Kidney Infection- has about one week course left to take 02/18/15  Yes Historical Provider, MD  vitamin B-12 (CYANOCOBALAMIN) 500 MCG tablet Take 500 mcg by mouth daily.   Yes Historical Provider, MD  vitamin C (ASCORBIC ACID) 500 MG tablet Take 500 mg by mouth daily.   Yes Historical Provider, MD  vitamin E 100 UNIT capsule Take 400 Units by mouth daily.    Yes Historical Provider, MD  lactulose (CHRONULAC) 10 GM/15ML solution Take 45 mLs (30 g total) by mouth 3 (three) times daily. Decrease to twice a day after first BM, then once a day after 1 week 03/06/15   Daleen Bo, MD  metFORMIN (GLUCOPHAGE) 500 MG tablet Take 2 tablets (1,000 mg total) by mouth 2 (two) times daily with a meal. Patient not taking: Reported on 03/06/2015 01/19/14   Eileen Stanford, PA-C  sulfamethoxazole-trimethoprim (BACTRIM DS,SEPTRA DS) 800-160 MG tablet  Take 1 tablet by mouth 2 (two) times daily with a meal. 7 day course 02/18/15/-02/26/15 02/18/15   Historical Provider, MD   BP 130/81 mmHg  Pulse 82  Temp(Src) 97.6 F (36.4 C) (Oral)  Resp 20  SpO2 99% Physical Exam  Constitutional: He is oriented to person, place, and time. He appears well-developed and well-nourished.  Elderly, vigorous  HENT:  Head: Normocephalic and atraumatic.  Right Ear: External ear normal.  Left Ear: External ear normal.  Eyes: Conjunctivae and EOM are normal. Pupils are equal, round, and reactive to light.  Neck: Normal range of motion and phonation normal. Neck supple.  Cardiovascular: Normal rate, regular rhythm and normal heart sounds.   Pulmonary/Chest: Effort normal and breath sounds normal. He exhibits no bony tenderness.  Abdominal: Soft. He exhibits no mass. There is no  tenderness. There is no guarding.  Genitourinary:  No stool in rectum, no apparent blood on exam.  Musculoskeletal: Normal range of motion.  Neurological: He is alert and oriented to person, place, and time. No cranial nerve deficit or sensory deficit. He exhibits normal muscle tone. Coordination normal.  Skin: Skin is warm, dry and intact.  Psychiatric: He has a normal mood and affect. His behavior is normal. Judgment and thought content normal.  Nursing note and vitals reviewed.   ED Course  Procedures (including critical care time)  Medications  lactulose (CHRONULAC) 10 GM/15ML solution 30 g (30 g Oral Given 03/06/15 0834)    Patient Vitals for the past 24 hrs:  BP Temp Temp src Pulse Resp SpO2  03/06/15 0745 130/81 mmHg 97.6 F (36.4 C) Oral 82 20 99 %    9:53 AM Reevaluation with update and discussion. After initial assessment and treatment, an updated evaluation reveals he remains comfortable. Findings discussed with wife and patient. All questions answered.. [facility] Review Labs Reviewed - No data to display  Imaging Review Dg Abd 1  View  03/06/2015   CLINICAL DATA:  Two week history of constipation  EXAM: ABDOMEN - 1 VIEW  COMPARISON:  None.  FINDINGS: There is fairly diffuse stool throughout the colon. The right colon is mildly distended with stool. There is no bowel dilatation or air-fluid level suggesting obstruction. No free air. There are arterial vascular calcifications in both common femoral arteries.  IMPRESSION: Fairly diffuse stool throughout the colon with mild distention of colon due to stool on the right. No obstruction or free air is appreciable on this supine examination.   Electronically Signed   By: Lowella Grip III M.D.   On: 03/06/2015 08:21   I have personally reviewed and evaluated these images and lab results as part of my medical decision-making.   EKG Interpretation None      MDM   Final diagnoses:  Slow transit constipation    Evaluation consistent with constipation. Patient is nontoxic. Doubt serous bacterial vision, metabolic instability or impending vascular collapse.   Nursing Notes Reviewed/ Care Coordinated Applicable Imaging Reviewed Interpretation of Laboratory Data incorporated into ED treatment  The patient appears reasonably screened and/or stabilized for discharge and I doubt any other medical condition or other [facility] requiring further screening, evaluation, or treatment in the ED at this time prior to discharge.  Plan: Home Medications- Lactulose; Home Treatments- rest; return here if the recommended treatment, does not improve the symptoms; Recommended follow up- PCP prn     Daleen Bo, MD 03/06/15 [phone]

## 2015-03-06 NOTE — ED Notes (Signed)
Patient transported to X-ray 

## 2015-04-02 ENCOUNTER — Ambulatory Visit (INDEPENDENT_AMBULATORY_CARE_PROVIDER_SITE_OTHER): Payer: Medicare Other | Admitting: Cardiovascular Disease

## 2015-04-02 ENCOUNTER — Encounter: Payer: Self-pay | Admitting: Cardiovascular Disease

## 2015-04-02 VITALS — BP 158/70 | HR 61 | Ht 71.0 in | Wt 183.0 lb

## 2015-04-02 DIAGNOSIS — I2510 Atherosclerotic heart disease of native coronary artery without angina pectoris: Secondary | ICD-10-CM

## 2015-04-02 DIAGNOSIS — I2583 Coronary atherosclerosis due to lipid rich plaque: Secondary | ICD-10-CM

## 2015-04-02 DIAGNOSIS — E785 Hyperlipidemia, unspecified: Secondary | ICD-10-CM | POA: Diagnosis not present

## 2015-04-02 DIAGNOSIS — I340 Nonrheumatic mitral (valve) insufficiency: Secondary | ICD-10-CM | POA: Diagnosis not present

## 2015-04-02 DIAGNOSIS — I10 Essential (primary) hypertension: Secondary | ICD-10-CM | POA: Diagnosis not present

## 2015-04-02 NOTE — Progress Notes (Signed)
04/02/2015 Ronald Benitez   04-28-1929  [id number]  Primary Physician [name], [name], MD Primary Cardiologist: Lorretta Harp MD [name]   HPI:  Ronald Benitez is an 81 year old married Caucasian male father of 56, grandfather and 7 grandchildren retired Optometrist. I last saw him in the office 02/06/14. He was referred here for evaluation of increasing dyspnea on exertion. His cardiac risk factors include remote tobacco abuse having smoked 50 pack years and stopped back in 1986. After that he smoked cigars up until this past November. History of diabetes, hypertension, and hyperlipidemia. Both parents died in an elderly age of heart-related issues. He had stents placed as well by Dr. Pernell Dupre in March and November of 1997. He did have recatheterization to 3 years later and was told "everything was okay". He did have nitroglycerin responsive angina but has not taken supplement glycerin since 2003. Over the last year he's had progressive dyspnea on exertion for unclear reasons. He was referred to Dr. Court Joy come up pulmonologist, for pulmonary evaluation as well. A recent Myoview stress test showed inferolateral ischemia a 2-D echo revealed normal LV systolic function with mild to moderate mitral regurgitation. I admitted him for cardiac catheterization which I performed on 01/15/14 revealing high-grade tandem distal calcified RCA lesions which ultimately were treated with high-speed rotational atherectomy, PCI and stenting the following day by Dr. Ellouise Newer. The symptoms of shortness of breath as have somewhat improved since his procedure. I also performed lower extremity Doppler studies because of some symptoms of claudication revealing ABIs are close to one bilaterally with tibial vessel disease. Since I saw him one year ago he's remaining currently stable.   Current Outpatient Prescriptions  Medication Sig Dispense Refill  . aspirin EC 81 MG EC tablet Take 1  tablet (81 mg total) by mouth daily.    . B Complex Vitamins (VITAMIN B COMPLEX PO) Take 1 tablet by mouth every evening.    . captopril (CAPOTEN) 12.5 MG tablet Take 6.25 mg by mouth 2 (two) times daily.     . carbidopa-levodopa (SINEMET IR) 25-100 MG per tablet TAKE 2 BY MOUTH EVERY MORNING AND AT NOON AND TAKE 1 BY MOUTH EVERY EVENING (Patient taking differently: TAKE 2 BY MOUTH EVERY MORNING AND 2 tablets  AT NOON AND then TAKE 1 BY MOUTH EVERY EVENING) 450 tablet 1  . clopidogrel (PLAVIX) 75 MG tablet Take 1 tablet (75 mg total) by mouth daily with breakfast. 30 tablet 11  . desipramine (NORPRAMIN) 50 MG tablet Take 100 mg by mouth at bedtime.    . finasteride (PROSCAR) 5 MG tablet Take 5 mg by mouth daily.    Marland Kitchen glimepiride (AMARYL) 1 MG tablet Take 1 mg by mouth daily with breakfast.    . Glucosamine-Chondroitin-Vit D3 1500-1200-800 MG-MG-UNIT PACK Take 2 tablets by mouth daily.    Marland Kitchen lactulose (CHRONULAC) 10 GM/15ML solution Take 45 mLs (30 g total) by mouth 3 (three) times daily. Decrease to twice a day after first BM, then once a day after 1 week 946 mL 0  . LORazepam (ATIVAN) 0.5 MG tablet Take 0.5 mg by mouth every 8 (eight) hours.    . metoprolol (LOPRESSOR) 25 MG tablet Take 1 tablet (25 mg total) by mouth 2 (two) times daily. 60 tablet 11  . Multiple Vitamins-Minerals (ICAPS) CAPS Take 2 capsules by mouth daily.    . nitroGLYCERIN (NITROSTAT) 0.4 MG SL tablet Place 1 tablet (0.4 mg total) under the tongue every  5 (five) minutes x 3 doses as needed for chest pain. 25 tablet 12  . Omega-3 Fatty Acids (FISH OIL) 1000 MG CAPS Take 1,000 mg by mouth daily.    . polyethylene glycol (MIRALAX / GLYCOLAX) packet Take 17 g by mouth daily as needed for mild constipation.     [name] Sodium (STOOL SOFTENER & LAXATIVE PO) Take 2 tablets by mouth daily as needed (constipation).     . simvastatin (ZOCOR) 80 MG tablet Take 80 mg by mouth every evening.     . tamsulosin (FLOMAX) 0.4 MG  CAPS capsule Take 0.4 mg by mouth daily. Pt is taking for a Kidney Infection- has about one week course left to take  0  . vitamin B-12 (CYANOCOBALAMIN) 500 MCG tablet Take 500 mcg by mouth daily.    . vitamin C (ASCORBIC ACID) 500 MG tablet Take 500 mg by mouth daily.    . vitamin E 100 UNIT capsule Take 400 Units by mouth daily.      No current facility-administered medications for this visit.    No Known Allergies  Social History   Social History  . Marital Status: Married    Spouse Name: N/A  . Number of Children: N/A  . Years of Education: N/A   Occupational History  . retired    Social History Main Topics  . Smoking status: Former Smoker -- 1.50 packs/day    Types: Cigarettes, Cigars    Quit date: 05/07/2013  . Smokeless tobacco: Never Used     Comment: quit cigarettes 1985 1.5 PPD, quit cigars 05/2013-- 5 cigars per day  . Alcohol Use: No  . Drug Use: No  . Sexual Activity: Not on file   Other Topics Concern  . Not on file   Social History Narrative     Review of Systems: General: negative for chills, fever, night sweats or weight changes.  Cardiovascular: negative for chest pain, dyspnea on exertion, edema, orthopnea, palpitations, paroxysmal nocturnal dyspnea or shortness of breath Dermatological: negative for rash Respiratory: negative for cough or wheezing Urologic: negative for hematuria Abdominal: negative for nausea, vomiting, diarrhea, bright red blood per rectum, melena, or hematemesis Neurologic: negative for visual changes, syncope, or dizziness All other systems reviewed and are otherwise negative except as noted above.    Blood pressure 158/70, pulse 61, height 5\' 11"  (1.803 m), weight 183 lb (83.008 kg).  General appearance: alert and no distress Neck: no adenopathy, no carotid bruit, no JVD, supple, symmetrical, trachea midline and thyroid not enlarged, symmetric, no tenderness/mass/nodules Lungs: clear to auscultation bilaterally Heart:  regular rate and rhythm, S1, S2 normal, no murmur, click, rub or gallop Extremities: extremities normal, atraumatic, no cyanosis or edema  EKG normal sinus rhythm at 61 with right bundle-branch block and left anterior fascicular block (bifascicular block) unchanged from prior EKGs. I personally reviewed this EKG  ASSESSMENT AND PLAN:   Hyperlipidemia History of hyperlipidemia on simvastatin followed by his PCP  Essential hypertension History of hypertension with blood pressure measured at 158/70. He is on captopril and metoprolol. Continue current meds at current dosing  CAD (coronary artery disease) History of CAD status post stents placed in his heart in 1997 by Dr. Pernell Dupre. He had catheterization done 3 years later and was told that everything was "okay". I performed cardiac catheterization on him 01/15/14 revealing high-grade tandem distal calcified RCA lesions which ultimately underwent high-speed rotational atherectomy by Dr. Ellouise Newer along with PCI and stenting. He denies chest pain or  shortness of breath.  Mitral regurgitation History of mild to moderate mitral regurgitation by 2-D echo June 2015      Lorretta Harp MD [facility], [facility] 04/02/2015 3:42 PM

## 2015-04-02 NOTE — Assessment & Plan Note (Signed)
History of hypertension with blood pressure measured at 158/70. He is on captopril and metoprolol. Continue current meds at current dosing

## 2015-04-02 NOTE — Assessment & Plan Note (Signed)
History of mild to moderate mitral regurgitation by 2-D echo June 2015

## 2015-04-02 NOTE — Patient Instructions (Signed)

## 2015-04-02 NOTE — Assessment & Plan Note (Signed)
History of hyperlipidemia on simvastatin followed by his PCP 

## 2015-04-02 NOTE — Assessment & Plan Note (Addendum)
History of CAD status post stents placed in his heart in 1997 by Dr. Pernell Dupre. He had catheterization done 3 years later and was told that everything was "okay". I performed cardiac catheterization on him 01/15/14 revealing high-grade tandem distal calcified RCA lesions which ultimately underwent high-speed rotational atherectomy by Dr. Ellouise Newer along with PCI and stenting. He denies chest pain or shortness of breath.

## 2015-04-24 ENCOUNTER — Other Ambulatory Visit: Payer: Self-pay | Admitting: Neurology

## 2015-04-24 NOTE — Telephone Encounter (Signed)
Refill denied. Not seen since 10/2013.

## 2015-04-29 ENCOUNTER — Telehealth: Payer: Self-pay | Admitting: Neurology

## 2015-04-29 NOTE — Telephone Encounter (Signed)
Called patient and made him aware he needs an appt for further refills. Appt made.

## 2015-04-29 NOTE — Telephone Encounter (Signed)
VM-PT left message in regards to his medication Carbadopa/Dawn CB# [phone]

## 2015-05-20 ENCOUNTER — Encounter: Payer: Self-pay | Admitting: Neurology

## 2015-05-20 ENCOUNTER — Ambulatory Visit (INDEPENDENT_AMBULATORY_CARE_PROVIDER_SITE_OTHER): Payer: Medicare Other | Admitting: Neurology

## 2015-05-20 VITALS — BP 128/90 | HR 62 | Ht 71.5 in | Wt 191.0 lb

## 2015-05-20 DIAGNOSIS — G20 Parkinson's disease: Secondary | ICD-10-CM

## 2015-05-20 NOTE — Progress Notes (Signed)
Ronald Benitez was seen today in the movement disorders clinic for neurologic consultation at the request of [name], [name], MD.  The patient is accompanied in the office today by his wife, who supplements the history.  Patient was diagnosed with Parkinson's disease on 05/08/2013.  Last visit, we started the patient on carbidopa/levodopa 25/100, one tablet 3 times per day.  The patient was also enrolled in the Parkinson's disease therapy program.  He does think that the therapy helped.  He is not sure of the levodopa has been helpful.  He is not doing faithful cardiovascular exercise.  He has fallen one time.  He tripped over a rug.  No hallucinations.  No lightheadedness or near syncope.  10/16/13 update:  The patient is following up regarding Parkinson's disease.  He is accompanied by his wife who helps to supplement the history.  He is currently on carbidopa/levodopa 25/100, one tablet 3 times per day.  He has a history of falls, which are multifactorial, likely due to Parkinson's disease as well as peripheral neuropathy.  No falls since last visit but had near falls.  He is off of the desipramine. Pt not sure if the carbidopa/levodopa 25/100 working.   Having a hard time getting out of the chair.  He seems more unstable.  He has developed a cough.  He has noticed some shortness of breath.  His wife states that he moans all of the time, but the patient cannot state why he does that.  Last visit to PCP was in February.  05/20/15 update:  The patient follows up today in the movement disorder clinic.  This patient is accompanied in the office by his spouse who supplements the history.  I have not seen him since May, 2015.  I have reviewed an extensive number of records made available to me.  At last visit I increased his carbidopa/levodopa 25/100 from one tablet 3 times a day to 2 tablets in the morning, 2 in the afternoon and one in the evening.  I also referred him to rehabilitation.  I  was also concerned about shortness of breath since last visit and asked his primary care physician to see him that same day, which he did.  He subsequently saw cardiology and had a Myoview done in July, 2015 which was an intermediate risk study with significant reversible lateral apical defect noted.  Following this, a cardiac cath was recommended and was completed in August, 2015 at which point a 95% stenosis was identified and subsequently stented.  Pt reports that he is less SOB.  No falls.  He is trying to do some walking but states that he has a "bad knee" which limits exercise.  No hallucinations.   PREVIOUS MEDICATIONS: none to date  ALLERGIES:  No Known Allergies  CURRENT MEDICATIONS:    Medication List       This list is accurate as of: 05/20/15  1:16 PM.  Always use your most recent med list.               aspirin 81 MG EC tablet  Take 1 tablet (81 mg total) by mouth daily.     captopril 12.5 MG tablet  Commonly known as:  CAPOTEN  Take 6.25 mg by mouth 2 (two) times daily.     carbidopa-levodopa 25-100 MG tablet  Commonly known as:  SINEMET IR  TAKE 2 BY MOUTH EVERY MORNING AND AT NOON AND TAKE 1 BY MOUTH EVERY EVENING  clopidogrel 75 MG tablet  Commonly known as:  PLAVIX  Take 1 tablet (75 mg total) by mouth daily with breakfast.     co-enzyme Q-10 30 MG capsule  Take 30 mg by mouth 3 (three) times daily.     desipramine 50 MG tablet  Commonly known as:  NORPRAMIN  Take 100 mg by mouth at bedtime.     finasteride 5 MG tablet  Commonly known as:  PROSCAR  Take 5 mg by mouth daily.     Fish Oil 1000 MG Caps  Take 1,000 mg by mouth daily.     glimepiride 1 MG tablet  Commonly known as:  AMARYL  Take 2 mg by mouth daily with breakfast.     Glucosamine-Chondroitin-Vit D3 1500-1200-800 MG-MG-UNIT Pack  Take 2 tablets by mouth daily.     ICAPS Caps  Take 2 capsules by mouth daily.     lactulose 10 GM/15ML solution  Commonly known as:  CHRONULAC  Take  45 mLs (30 g total) by mouth 3 (three) times daily. Decrease to twice a day after first BM, then once a day after 1 week     LORazepam 0.5 MG tablet  Commonly known as:  ATIVAN  Take 0.5 mg by mouth every 8 (eight) hours.     metoprolol tartrate 25 MG tablet  Commonly known as:  LOPRESSOR  Take 1 tablet (25 mg total) by mouth 2 (two) times daily.     nitroGLYCERIN 0.4 MG SL tablet  Commonly known as:  NITROSTAT  Place 1 tablet (0.4 mg total) under the tongue every 5 (five) minutes x 3 doses as needed for chest pain.     polyethylene glycol packet  Commonly known as:  MIRALAX / GLYCOLAX  Take 17 g by mouth daily as needed for mild constipation.     simvastatin 80 MG tablet  Commonly known as:  ZOCOR  Take 80 mg by mouth every evening.     STOOL SOFTENER & LAXATIVE PO  Take 2 tablets by mouth daily as needed (constipation).     tamsulosin 0.4 MG Caps capsule  Commonly known as:  FLOMAX  Take 0.4 mg by mouth daily. Pt is taking for a Kidney Infection- has about one week course left to take     VITAMIN B COMPLEX PO  Take 1 tablet by mouth every evening.     vitamin B-12 500 MCG tablet  Commonly known as:  CYANOCOBALAMIN  Take 500 mcg by mouth daily.     vitamin C 500 MG tablet  Commonly known as:  ASCORBIC ACID  Take 500 mg by mouth daily.     vitamin E 100 UNIT capsule  Take 400 Units by mouth daily.         PAST MEDICAL HISTORY:   Past Medical History  Diagnosis Date  . HTN (hypertension)   . Hyperlipidemia   . DM (diabetes mellitus) ([place])   . GERD (gastroesophageal reflux disease)   . CAD (coronary artery disease)     a. 01/16/14 s/p DES x2 to RCA. b remote RCA stenting 1997   . Depression   . Chronic kidney disease   . Mitral regurgitation     a. mod by 11/2013 ECHO  . Asymptomatic LV dysfunction     a. 2D ECHO on 12/04/2013. LV EF: 50% - 55%; G3DD, mild AR, mod MR, mild RA dilation, increased thickness of atrial septum, c/w lipomatous hypertrophy. Small  patent foramen ovale. Present only with provocation from having  the patient cough. Agitated saline contrast study showed a very small right-to-left atrial level shunt, in the baseline state.    PAST SURGICAL HISTORY:   Past Surgical History  Procedure Laterality Date  . Angioplasty  1997  . Esophagogastroduodenoscopy endoscopy    . Left heart catheterization with coronary angiogram N/A 01/15/2014    Procedure: LEFT HEART CATHETERIZATION WITH CORONARY ANGIOGRAM;  Surgeon: Lorretta Harp, MD;  Location: [facility] CATH LAB;  Service: Cardiovascular;  Laterality: N/A;  . Percutaneous coronary rotoblator intervention (pci-r) N/A 01/16/2014    Procedure: PERCUTANEOUS CORONARY ROTOBLATOR INTERVENTION (PCI-R);  Surgeon: Troy Sine, MD;  Location[facility] C[facility] LAB;  Service: Cardiovascular;  Laterality: N/A;    SOCIAL HISTORY:   Social History   Social History  . Marital Status: Married    Spouse Name: N/A  . Number of Children: N/A  . Years of Education: N/A   Occupational History  . retired    Social History Main Topics  . Smoking status: Former Smoker -- 1.50 packs/day    Types: Cigarettes, Cigars    Quit date: 05/07/2013  . Smokeless tobacco: Never Used     Comment: quit cigarettes 1985 1.5 PPD, quit cigars 05/2013-- 5 cigars per day  . Alcohol Use: No  . Drug Use: No  . Sexual Activity: Not on file   Other Topics Concern  . Not on file   Social History Narrative    FAMILY HISTORY:   Family Status  Relation Status Death Age  . Mother Deceased     Heart Attack  . Father Deceased     Heart Problems  . Sister Alive     2, Lung Cancer  . Brother Deceased     Encephalitis  . Son Alive     3, Diabetes  . Daughter Alive     ROS:  A complete 10 system review of systems was obtained and was unremarkable apart from what is mentioned above.  PHYSICAL EXAMINATION:    VITALS:   Filed Vitals:   05/20/15 1304  BP: 128/90  Pulse: 62  Height: 5' 11.5" (1.816 m)  Weight: 191 lb  (86.637 kg)    GEN:  The patient appears stated age.   HEENT:  [place]/AT.  MMM. CV:  RRR Lungs:  Clear but mild DOE (markedly improved compared to last visit)   Neurological examination:  Orientation: The patient is alert and oriented x3.    Movement examination: Tone: There is normal tone in the upper extremity today. Abnormal movements: There is no tremor noted today Coordination:  There is normal rapid alternating movements. Gait and Station: The patient makes 3 attempts to get out of the chair without the use of his hands and he is successful on the third attempt.  He is mildly unsteady, but he is not short stepped or festinating today.  ASSESSMENT/PLAN:  1.  idiopathic Parkinson's disease.  -He looks markedly improved compared to last visit and we'll continue on carbidopa/levodopa 25/100, 2 tablets in the morning, 2 in the afternoon and 1 in evening. 2.  Falls  -He has not had any falls since his time spent at the neuro-rehabilitation center and the increase in levodopa. 3.  shortness of breath  -This resolved after his cardiac stent. 4.  I will f/u with him in the next 6 months, sooner should new neurologic issues arise.  Greater than 50% of the 25 minute visit spent in counseling and coordinating care.

## 2015-05-28 ENCOUNTER — Other Ambulatory Visit: Payer: Self-pay | Admitting: Neurology

## 2015-05-28 NOTE — Telephone Encounter (Signed)
Carbidopa Levodopa refill requested. Per last office note- patient to remain on medication. Refill approved and sent to patient's pharmacy.   

## 2015-06-05 ENCOUNTER — Ambulatory Visit: Payer: Medicare Other | Admitting: Physical Therapy

## 2015-06-05 ENCOUNTER — Ambulatory Visit: Payer: Medicare Other

## 2015-06-05 ENCOUNTER — Ambulatory Visit: Payer: Medicare Other | Attending: Internal Medicine | Admitting: Occupational Therapy

## 2015-06-05 DIAGNOSIS — R279 Unspecified lack of coordination: Secondary | ICD-10-CM

## 2015-06-05 DIAGNOSIS — R471 Dysarthria and anarthria: Secondary | ICD-10-CM

## 2015-06-05 DIAGNOSIS — R269 Unspecified abnormalities of gait and mobility: Secondary | ICD-10-CM | POA: Insufficient documentation

## 2015-06-05 NOTE — Therapy (Signed)
[facility] [address] [place], [place], [address] Phone: [phone]   Fax:  [phone]  Patient Details  Name: Ronald Benitez MRN: [id number] Date of Birth: 1928/12/11 Referring Provider:  No ref. provider found  Encounter Date: 02/12/2015  No therapy provided this date-note created in error.  MARRIOTT,AMY W. 06/05/2015, 8:02 AM  Frazier Butt., PT  [facility] [address] [place], [place], [address] Phone: [phone]   Fax:  [phone]

## 2015-06-05 NOTE — Therapy (Signed)
[facility] [address] [place], [place], [address] Phone: [phone]   Fax:  [phone]  Patient Details  Name: Ronald Benitez MRN: [id number] Date of Birth: 1928/09/01 Referring Provider:  Carlena Sax, MD  Encounter Date: 06/05/2015  Speech Therapy Parkinson's Disease Screen  Decibel Level today: 71dB  (WNL=70-72 dB) in 8 minutes simple to mod complex conversation with sound level meter 30cm away from pt's mouth  Pt has not experienced difficulty in swallowing warranting objective evaluation  Pt does does not require speech therapy services at this time. Recommend ST screen in another 6 months   [facility] , MS, CCC-SLP 06/05/2015, 10:52 AM  [facility] [address] [place], [place], [address] Phone: [phone]   Fax:  [phone]

## 2015-06-05 NOTE — Therapy (Signed)
[facility] [address] [place], [place], [address] Phone: [phone]   Fax:  [phone]  Patient Details  Name: Ronald Benitez MRN: [id number] Date of Birth: Mar 24, 1929 Referring Provider:  Carlena Sax, MD  Encounter Date: 06/05/2015   Occupational Therapy Parkinson's Disease Screen  Physical Performance Test item #4 (donning/doffing jacket):  20.32sec  9-hole peg test:    RUE  33.64 sec        LUE  33.69 sec  Box & Blocks Test:   RUE 40 blocks        LUE 38 blocks  Change in ability to perform ADLs/IADLs:  no  Other Comments:  Pt is not currently performing coordination HEP.  Recommended pt resume   Pt does not require occupation therapy services at this time.  Recommended occupational therapy screen in   approx 6 months   [name], OTR/L 06/05/2015 10:45 AM  [facility] [address] [place], [place], [address] Phone: [phone]   Fax:  [phone]

## 2015-06-05 NOTE — Therapy (Signed)
[facility] [address] [place], [place], [address] Phone: [phone]   Fax:  [phone]  Patient Details  Name: Ronald Benitez MRN: [id number] Date of Birth: 06-Feb-1929 Referring Provider:  Carlena Sax, MD  Encounter Date: 06/05/2015  Physical Therapy Parkinson's Disease Screen   Timed Up and Go test:11.72 sec  10 meter walk test:11.59 sec  (2.82 ft/sec)  5 time sit to stand test:16.23 sec   Pt reports no falls in recent months. Patient does not require Physical Therapy services at this time.  Recommend Physical Therapy screen in 6 months.    Sutton Hirsch W. 06/05/2015, 10:19 AM  Frazier Butt., PT  [facility] [address] [place], [place], [address] Phone: [phone]   Fax:  [phone]

## 2015-07-23 ENCOUNTER — Ambulatory Visit
Admission: RE | Admit: 2015-07-23 | Discharge: 2015-07-23 | Disposition: A | Discharge status: Home / Self Care | Payer: Medicare Other | Source: Ambulatory Visit | Attending: Gastroenterology | Admitting: Gastroenterology

## 2015-07-23 ENCOUNTER — Other Ambulatory Visit: Payer: Self-pay | Admitting: Gastroenterology

## 2015-07-23 DIAGNOSIS — K5909 Other constipation: Secondary | ICD-10-CM

## 2015-07-28 ENCOUNTER — Telehealth: Payer: Self-pay

## 2015-07-28 NOTE — Telephone Encounter (Signed)
Requesting surgical clearance:   1. Type of surgery: colonoscopoy  2. Surgeon: Dr. Michail Sermon  3. Surgical date: 08/06/2015  4. Medications that need to be held: n/a  5. CAD: Yes     6. I will defer to: berry  [facility] [id number] [phone] fax

## 2015-07-28 NOTE — Telephone Encounter (Signed)
OK to interrupt DAPT for colonoscopy

## 2015-07-31 ENCOUNTER — Encounter: Payer: Self-pay | Admitting: *Deleted

## 2015-07-31 NOTE — Telephone Encounter (Signed)
Will forward this note to the number provided. 

## 2015-07-31 NOTE — Telephone Encounter (Signed)
Still waiting for clearance. Please fax [phone].

## 2015-08-06 ENCOUNTER — Other Ambulatory Visit: Payer: Self-pay | Admitting: Gastroenterology

## 2015-11-12 ENCOUNTER — Telehealth: Payer: Self-pay | Admitting: Neurology

## 2015-11-12 MED ORDER — CARBIDOPA-LEVODOPA 25-100 MG PO TABS: ORAL_TABLET | ORAL | Status: DC

## 2015-11-12 NOTE — Telephone Encounter (Signed)
[facility] mail order pharmacy called to request refill of Carbidopa Levodopa. Refill approved.

## 2015-11-18 ENCOUNTER — Ambulatory Visit (INDEPENDENT_AMBULATORY_CARE_PROVIDER_SITE_OTHER): Payer: Medicare Other | Admitting: Neurology

## 2015-11-18 ENCOUNTER — Encounter: Payer: Self-pay | Admitting: Neurology

## 2015-11-18 VITALS — BP 124/80 | HR 68 | Ht 71.5 in | Wt 193.0 lb

## 2015-11-18 DIAGNOSIS — M25562 Pain in left knee: Secondary | ICD-10-CM | POA: Diagnosis not present

## 2015-11-18 DIAGNOSIS — G20 Parkinson's disease: Secondary | ICD-10-CM | POA: Diagnosis not present

## 2015-11-18 NOTE — Progress Notes (Signed)
Ronald Benitez was seen today in the movement disorders clinic for neurologic consultation at the request of [name], [name], MD.  The patient is accompanied in the office today by his wife, who supplements the history.  Patient was diagnosed with Parkinson's disease on 05/08/2013.  Last visit, we started the patient on carbidopa/levodopa 25/100, one tablet 3 times per day.  The patient was also enrolled in the Parkinson's disease therapy program.  He does think that the therapy helped.  He is not sure of the levodopa has been helpful.  He is not doing faithful cardiovascular exercise.  He has fallen one time.  He tripped over a rug.  No hallucinations.  No lightheadedness or near syncope.  10/16/13 update:  The patient is following up regarding Parkinson's disease.  He is accompanied by his wife who helps to supplement the history.  He is currently on carbidopa/levodopa 25/100, one tablet 3 times per day.  He has a history of falls, which are multifactorial, likely due to Parkinson's disease as well as peripheral neuropathy.  No falls since last visit but had near falls.  He is off of the desipramine. Pt not sure if the carbidopa/levodopa 25/100 working.   Having a hard time getting out of the chair.  He seems more unstable.  He has developed a cough.  He has noticed some shortness of breath.  His wife states that he moans all of the time, but the patient cannot state why he does that.  Last visit to PCP was in February.  05/20/15 update:  The patient follows up today in the movement disorder clinic.  This patient is accompanied in the office by his spouse who supplements the history.  I have not seen him since May, 2015.  I have reviewed an extensive number of records made available to me.  At last visit I increased his carbidopa/levodopa 25/100 from one tablet 3 times a day to 2 tablets in the morning, 2 in the afternoon and one in the evening.  I also referred him to rehabilitation.  I  was also concerned about shortness of breath since last visit and asked his primary care physician to see him that same day, which he did.  He subsequently saw cardiology and had a Myoview done in July, 2015 which was an intermediate risk study with significant reversible lateral apical defect noted.  Following this, a cardiac cath was recommended and was completed in August, 2015 at which point a 95% stenosis was identified and subsequently stented.  Pt reports that he is less SOB.  No falls.  He is trying to do some walking but states that he has a "bad knee" which limits exercise.  No hallucinations.  11/18/15 update:  The patient follows up today in the movement disorder clinic.  He is on carbidopa/levodopa 25/100 2 tablets in the morning, 2 in the afternoon and one in the evening.  He has done well over the last 6 months.  No falls.  No lightheadedness or near syncope.  No hallucinations.  States that he is going to the neurorehab center soon for a screen.  States that his L knee is very painful and he "can barely walk."     PREVIOUS MEDICATIONS: none to date  ALLERGIES:  No Known Allergies  CURRENT MEDICATIONS:    Medication List       This list is accurate as of: 11/18/15 12:55 PM.  Always use your most recent med list.  aspirin 81 MG EC tablet  Take 1 tablet (81 mg total) by mouth daily.     captopril 12.5 MG tablet  Commonly known as:  CAPOTEN  Take 6.25 mg by mouth 2 (two) times daily.     carbidopa-levodopa 25-100 MG tablet  Commonly known as:  SINEMET IR  2 in the morning, 2 in the afternoon, 1 in the evening     clopidogrel 75 MG tablet  Commonly known as:  PLAVIX  Take 1 tablet (75 mg total) by mouth daily with breakfast.     co-enzyme Q-10 30 MG capsule  Take 30 mg by mouth 3 (three) times daily.     desipramine 50 MG tablet  Commonly known as:  NORPRAMIN  Take 100 mg by mouth at bedtime.     finasteride 5 MG tablet  Commonly known as:  PROSCAR    Take 5 mg by mouth daily.     Fish Oil 1000 MG Caps  Take 1,000 mg by mouth daily.     glimepiride 1 MG tablet  Commonly known as:  AMARYL  Take 2 mg by mouth daily with breakfast.     Glucosamine-Chondroitin-Vit D3 1500-1200-800 MG-MG-UNIT Pack  Take 2 tablets by mouth daily.     ICAPS Caps  Take 2 capsules by mouth daily.     LORazepam 0.5 MG tablet  Commonly known as:  ATIVAN  Take 0.5 mg by mouth every 8 (eight) hours.     metoprolol tartrate 25 MG tablet  Commonly known as:  LOPRESSOR  Take 1 tablet (25 mg total) by mouth 2 (two) times daily.     nitroGLYCERIN 0.4 MG SL tablet  Commonly known as:  NITROSTAT  Place 1 tablet (0.4 mg total) under the tongue every 5 (five) minutes x 3 doses as needed for chest pain.     polyethylene glycol packet  Commonly known as:  MIRALAX / GLYCOLAX  Take 17 g by mouth daily as needed for mild constipation.     simvastatin 80 MG tablet  Commonly known as:  ZOCOR  Take 80 mg by mouth every evening.     STOOL SOFTENER & LAXATIVE PO  Take 2 tablets by mouth daily as needed (constipation).     tamsulosin 0.4 MG Caps capsule  Commonly known as:  FLOMAX  Take 0.4 mg by mouth daily. Pt is taking for a Kidney Infection- has about one week course left to take     VITAMIN B COMPLEX PO  Take 1 tablet by mouth every evening.     vitamin B-12 500 MCG tablet  Commonly known as:  CYANOCOBALAMIN  Take 500 mcg by mouth daily.     vitamin C 500 MG tablet  Commonly known as:  ASCORBIC ACID  Take 500 mg by mouth daily.     vitamin E 100 UNIT capsule  Take 400 Units by mouth daily.         PAST MEDICAL HISTORY:   Past Medical History  Diagnosis Date  . HTN (hypertension)   . Hyperlipidemia   . DM (diabetes mellitus) ([place])   . GERD (gastroesophageal reflux disease)   . CAD (coronary artery disease)     a. 01/16/14 s/p DES x2 to RCA. b remote RCA stenting 1997   . Depression   . Chronic kidney disease   . Mitral regurgitation      a. mod by 11/2013 ECHO  . Asymptomatic LV dysfunction     a. 2D ECHO on 12/04/2013. LV  EF: 50% - 55%; G3DD, mild AR, mod MR, mild RA dilation, increased thickness of atrial septum, c/w lipomatous hypertrophy. Small patent foramen ovale. Present only with provocation from having the patient cough. Agitated saline contrast study showed a very small right-to-left atrial level shunt, in the baseline state.    PAST SURGICAL HISTORY:   Past Surgical History  Procedure Laterality Date  . Angioplasty  1997  . Esophagogastroduodenoscopy endoscopy    . Left heart catheterization with coronary angiogram N/A 01/15/2014    Procedure: LEFT HEART CATHETERIZATION WITH CORONARY ANGIOGRAM;  Surgeon: Lorretta Harp, MD;  Location: [facility] CATH LAB;  Service: Cardiovascular;  Laterality: N/A;  . Percutaneous coronary rotoblator intervention (pci-r) N/A 01/16/2014    Procedure: PERCUTANEOUS CORONARY ROTOBLATOR INTERVENTION (PCI-R);  Surgeon: Troy Sine, MD;  Location: [facility] CATH LAB;  Service: Cardiovascular;  Laterality: N/A;    SOCIAL HISTORY:   Social History   Social History  . Marital Status: Married    Spouse Name: N/A  . Number of Children: N/A  . Years of Education: N/A   Occupational History  . retired    Social History Main Topics  . Smoking status: Former Smoker -- 1.50 packs/day    Types: Cigarettes, Cigars    Quit date: 05/07/2013  . Smokeless tobacco: Never Used     Comment: quit cigarettes 1985 1.5 PPD, quit cigars 05/2013-- 5 cigars per day  . Alcohol Use: No  . Drug Use: No  . Sexual Activity: Not on file   Other Topics Concern  . Not on file   Social History Narrative    FAMILY HISTORY:   Family Status  Relation Status Death Age  . Mother Deceased     Heart Attack  . Father Deceased     Heart Problems  . Sister Alive     2, Lung Cancer  . Brother Deceased     Encephalitis  . Son Alive     3, Diabetes  . Daughter Alive     ROS:  A complete 10 system review of  systems was obtained and was unremarkable apart from what is mentioned above.  PHYSICAL EXAMINATION:    VITALS:   Filed Vitals:   11/18/15 1253  BP: 124/80  Pulse: 68  Height: 5' 11.5" (1.816 m)  Weight: 193 lb (87.544 kg)    GEN:  The patient appears stated age.   HEENT:  [place]/AT.  MMM. CV:  RRR Lungs:  Clear but mild DOE (markedly improved compared to last visit)   Neurological examination:  Orientation: The patient is alert and oriented x3.    Movement examination: Tone: There is mild increased tone in the LUE Abnormal movements: There is rare tremor in both legs Coordination:  There is decreased finger taps, hand opening and closing on the L Gait and Station: The patient makes 3 attempts to get out of the chair without the use of his hands and he is successful on the third attempt.  He is mildly unsteady, but he is not short stepped or festinating today.  ASSESSMENT/PLAN:  1.  idiopathic Parkinson's disease.  -He is on carbidopa/levodopa 25/100, 2/2/1 and is just slightly underdosed but doesn't want to change medications yet.   2.  Falls  -He has not had any falls since his time spent at the neuro-rehabilitation center and the increase in levodopa.  Having walking issues because of L knee 3.  shortness of breath  -This resolved after his cardiac stent. 4.  L knee pain  -  I think that he needs ortho opinion but he wants to wait until he sees new PCP in July.   5.  I will f/u with him in the next 4 months, sooner should new neurologic issues arise.  Greater than 50% of the 25 minute visit spent in counseling and coordinating care.

## 2015-12-04 ENCOUNTER — Ambulatory Visit: Payer: Medicare Other | Admitting: Physical Therapy

## 2015-12-04 ENCOUNTER — Ambulatory Visit: Payer: Medicare Other | Admitting: Occupational Therapy

## 2015-12-04 ENCOUNTER — Ambulatory Visit: Payer: Medicare Other

## 2016-03-09 ENCOUNTER — Encounter: Payer: Self-pay | Admitting: Cardiovascular Disease

## 2016-03-09 ENCOUNTER — Ambulatory Visit (INDEPENDENT_AMBULATORY_CARE_PROVIDER_SITE_OTHER): Payer: Medicare Other | Admitting: Cardiovascular Disease

## 2016-03-09 VITALS — BP 150/74 | HR 63 | Ht 71.5 in | Wt 189.4 lb

## 2016-03-09 DIAGNOSIS — I10 Essential (primary) hypertension: Secondary | ICD-10-CM | POA: Diagnosis not present

## 2016-03-09 DIAGNOSIS — E7800 Pure hypercholesterolemia, unspecified: Secondary | ICD-10-CM | POA: Diagnosis not present

## 2016-03-09 DIAGNOSIS — Z23 Encounter for immunization: Secondary | ICD-10-CM | POA: Diagnosis not present

## 2016-03-09 DIAGNOSIS — I2510 Atherosclerotic heart disease of native coronary artery without angina pectoris: Secondary | ICD-10-CM | POA: Diagnosis not present

## 2016-03-09 NOTE — Patient Instructions (Signed)
Medication Instructions:  NO CHANGES.  Labwork: Labwork will be requested from your primary care physician.   Follow-Up: Your physician wants you to follow-up in: [place].  You will receive a reminder letter in the mail two months in advance. If you don't receive a letter, please call our office to schedule the follow-up appointment.   If you need a refill on your cardiac medications before your next appointment, please call your pharmacy.

## 2016-03-09 NOTE — Assessment & Plan Note (Signed)
History of hyperlipidemia on statin therapy followed by his PCP 

## 2016-03-09 NOTE — Assessment & Plan Note (Signed)
History of hypertension blood pressure measured at 150/74. He is on captopril and Lopressor. Continue current meds at current dosing

## 2016-03-09 NOTE — Assessment & Plan Note (Signed)
History of CAD status post RCA high-speed rotational atherectomy, drug-eluting stenting 01/16/14 by Dr. Claiborne Billings. The patient remains asymptomatic.

## 2016-03-09 NOTE — Progress Notes (Signed)
03/09/2016 Ronald Benitez   09/10/1928  [id number]  Primary Physician Ileana Roup, MD Primary Cardiologist: Lorretta Harp MD [name]  HPI:  [name] is an 82 year old married Caucasian male father of 73, grandfather and 7 grandchildren retired Optometrist. I last saw him in the office 04/02/15. He was referred here for evaluation of increasing dyspnea on exertion. His cardiac risk factors include remote tobacco abuse having smoked 50 pack years and stopped back in 1986. After that he smoked cigars up until this past November. History of diabetes, hypertension, and hyperlipidemia. Both parents died in an elderly age of heart-related issues. He had stents placed as well by Dr. Pernell Dupre in March and November of 1997. He did have recatheterization to 3 years later and was told "everything was okay". He did have nitroglycerin responsive angina but has not taken supplement glycerin since 2003. Over the last year he's had progressive dyspnea on exertion for unclear reasons. He was referred to Dr. Court Joy come up pulmonologist, for pulmonary evaluation as well. A recent Myoview stress test showed inferolateral ischemia a 2-D echo revealed normal LV systolic function with mild to moderate mitral regurgitation. I admitted him for cardiac catheterization which I performed on 01/15/14 revealing high-grade tandem distal calcified RCA lesions which ultimately were treated with high-speed rotational atherectomy, PCI and stenting the following day by Dr. Ellouise Newer. The symptoms of shortness of breath as have somewhat improved since his procedure. I also performed lower extremity Doppler studies because of some symptoms of claudication revealing ABIs are close to one bilaterally with tibial vessel disease. Since I saw him one year ago he's remaining currently stable.   Current Outpatient Prescriptions  Medication Sig Dispense Refill  . aspirin EC 81 MG EC tablet Take 1 tablet  (81 mg total) by mouth daily.    . B Complex Vitamins (VITAMIN B COMPLEX PO) Take 1 tablet by mouth every evening.    . captopril (CAPOTEN) 12.5 MG tablet Take 6.25 mg by mouth 2 (two) times daily.     . carbidopa-levodopa (SINEMET IR) 25-100 MG tablet 2 in the morning, 2 in the afternoon, 1 in the evening 450 tablet 0  . clopidogrel (PLAVIX) 75 MG tablet Take 1 tablet (75 mg total) by mouth daily with breakfast. 30 tablet 11  . co-enzyme Q-10 30 MG capsule Take 30 mg by mouth 3 (three) times daily.    Marland Kitchen desipramine (NORPRAMIN) 50 MG tablet Take 100 mg by mouth at bedtime.    . finasteride (PROSCAR) 5 MG tablet Take 5 mg by mouth daily.    Marland Kitchen glimepiride (AMARYL) 1 MG tablet Take 2 mg by mouth daily with breakfast.     . Glucosamine-Chondroitin-Vit D3 1500-1200-800 MG-MG-UNIT PACK Take 2 tablets by mouth daily.    Marland Kitchen LORazepam (ATIVAN) 0.5 MG tablet Take 0.5 mg by mouth every 8 (eight) hours.    . metoprolol (LOPRESSOR) 25 MG tablet Take 1 tablet (25 mg total) by mouth 2 (two) times daily. 60 tablet 11  . Multiple Vitamins-Minerals (ICAPS) CAPS Take 2 capsules by mouth daily.    . nitroGLYCERIN (NITROSTAT) 0.4 MG SL tablet Place 1 tablet (0.4 mg total) under the tongue every 5 (five) minutes x 3 doses as needed for chest pain. 25 tablet 12  . Omega-3 Fatty Acids (FISH OIL) 1000 MG CAPS Take 1,000 mg by mouth daily.    . polyethylene glycol (MIRALAX / GLYCOLAX) packet Take 17 g by mouth  daily as needed for mild constipation.     [name] Sodium (STOOL SOFTENER & LAXATIVE PO) Take 2 tablets by mouth daily as needed (constipation).     . simvastatin (ZOCOR) 80 MG tablet Take 80 mg by mouth every evening.     . tamsulosin (FLOMAX) 0.4 MG CAPS capsule Take 0.4 mg by mouth daily. Pt is taking for a Kidney Infection- has about one week course left to take  0  . vitamin B-12 (CYANOCOBALAMIN) 500 MCG tablet Take 500 mcg by mouth daily.    . vitamin C (ASCORBIC ACID) 500 MG tablet Take 500 mg by  mouth daily.    . vitamin E 100 UNIT capsule Take 400 Units by mouth daily.      No current facility-administered medications for this visit.     No Known Allergies  Social History   Social History  . Marital status: Married    Spouse name: N/A  . Number of children: N/A  . Years of education: N/A   Occupational History  . retired Retired   Social History Main Topics  . Smoking status: Former Smoker    Packs/day: 1.50    Types: Cigarettes, Cigars    Quit date: 05/07/2013  . Smokeless tobacco: Never Used     Comment: quit cigarettes 1985 1.5 PPD, quit cigars 05/2013-- 5 cigars per day  . Alcohol use No  . Drug use: No  . Sexual activity: Not on file   Other Topics Concern  . Not on file   Social History Narrative  . No narrative on file     Review of Systems: General: negative for chills, fever, night sweats or weight changes.  Cardiovascular: negative for chest pain, dyspnea on exertion, edema, orthopnea, palpitations, paroxysmal nocturnal dyspnea or shortness of breath Dermatological: negative for rash Respiratory: negative for cough or wheezing Urologic: negative for hematuria Abdominal: negative for nausea, vomiting, diarrhea, bright red blood per rectum, melena, or hematemesis Neurologic: negative for visual changes, syncope, or dizziness All other systems reviewed and are otherwise negative except as noted above.    Blood pressure (!) 150/74, pulse 63, height 5' 11.5" (1.816 m), weight 189 lb 6.4 oz (85.9 kg), SpO2 100 %.  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 I personally reviewed this EKG  ASSESSMENT AND PLAN:   Essential hypertension History of hypertension blood pressure measured at 150/74. He is on captopril and Lopressor. Continue current meds at current dosing  Hyperlipidemia History of hyperlipidemia on statin therapy followed by his PCP  CAD (coronary artery disease) History of CAD status post RCA high-speed rotational atherectomy, drug-eluting stenting 01/16/14 by Dr. Claiborne Billings. The patient remains asymptomatic.      Lorretta Harp MD FACP,FACC,FAHA, [facility] 03/09/2016 11:32 AM

## 2016-03-11 ENCOUNTER — Other Ambulatory Visit: Payer: Self-pay | Admitting: Neurology

## 2016-03-11 MED ORDER — CARBIDOPA-LEVODOPA 25-100 MG PO TABS: ORAL_TABLET | ORAL | 0 refills | Status: DC

## 2016-03-19 ENCOUNTER — Ambulatory Visit: Payer: Medicare Other | Admitting: Neurology

## 2016-03-19 NOTE — Progress Notes (Signed)
Ronald Benitez was seen today in the movement disorders clinic for neurologic consultation at the request of Ileana Roup, MD.  The patient is accompanied in the office today by his wife, who supplements the history.  Patient was diagnosed with Parkinson's disease on 05/08/2013.  Last visit, we started the patient on carbidopa/levodopa 25/100, one tablet 3 times per day.  The patient was also enrolled in the Parkinson's disease therapy program.  He does think that the therapy helped.  He is not sure of the levodopa has been helpful.  He is not doing faithful cardiovascular exercise.  He has fallen one time.  He tripped over a rug.  No hallucinations.  No lightheadedness or near syncope.  10/16/13 update:  The patient is following up regarding Parkinson's disease.  He is accompanied by his wife who helps to supplement the history.  He is currently on carbidopa/levodopa 25/100, one tablet 3 times per day.  He has a history of falls, which are multifactorial, likely due to Parkinson's disease as well as peripheral neuropathy.  No falls since last visit but had near falls.  He is off of the desipramine. Pt not sure if the carbidopa/levodopa 25/100 working.   Having a hard time getting out of the chair.  He seems more unstable.  He has developed a cough.  He has noticed some shortness of breath.  His wife states that he moans all of the time, but the patient cannot state why he does that.  Last visit to PCP was in February.  05/20/15 update:  The patient follows up today in the movement disorder clinic.  This patient is accompanied in the office by his spouse who supplements the history.  I have not seen him since May, 2015.  I have reviewed an extensive number of records made available to me.  At last visit I increased his carbidopa/levodopa 25/100 from one tablet 3 times a day to 2 tablets in the morning, 2 in the afternoon and one in the evening.  I also referred him to rehabilitation.  I was also  concerned about shortness of breath since last visit and asked his primary care physician to see him that same day, which he did.  He subsequently saw cardiology and had a Myoview done in July, 2015 which was an intermediate risk study with significant reversible lateral apical defect noted.  Following this, a cardiac cath was recommended and was completed in August, 2015 at which point a 95% stenosis was identified and subsequently stented.  Pt reports that he is less SOB.  No falls.  He is trying to do some walking but states that he has a "bad knee" which limits exercise.  No hallucinations.  11/18/15 update:  The patient follows up today in the movement disorder clinic.  He is on carbidopa/levodopa 25/100 2 tablets in the morning, 2 in the afternoon and one in the evening.  He has done well over the last 6 months.  No falls.  No lightheadedness or near syncope.  No hallucinations.  States that he is going to the neurorehab center soon for a screen.  States that his L knee is very painful and he "can barely walk."    03/22/16 update:  The patient f/u regarding his Parkinson's disease.  He remains on carbidopa/levodopa 25/100, 2 tablets in the morning, 2 in the afternoon and one in the evening.  He denies any falls.  He does have to be more careful on stairs.  Can't exercise because of knee pain.  No hallucinations.  No lightheadedness or near syncope.   PREVIOUS MEDICATIONS: none to date  ALLERGIES:  No Known Allergies  CURRENT MEDICATIONS:    Medication List       Accurate as of 03/22/16  2:02 PM. Always use your most recent med list.          aspirin 81 MG EC tablet Take 1 tablet (81 mg total) by mouth daily.   captopril 12.5 MG tablet Commonly known as:  CAPOTEN Take 6.25 mg by mouth 2 (two) times daily.   carbidopa-levodopa 25-100 MG tablet Commonly known as:  SINEMET IR 2 in the morning, 2 in the afternoon, 1 in the evening   clopidogrel 75 MG tablet Commonly known as:   PLAVIX Take 1 tablet (75 mg total) by mouth daily with breakfast.   co-enzyme Q-10 30 MG capsule Take 30 mg by mouth 3 (three) times daily.   desipramine 50 MG tablet Commonly known as:  NORPRAMIN Take 100 mg by mouth at bedtime.   finasteride 5 MG tablet Commonly known as:  PROSCAR Take 5 mg by mouth daily.   Fish Oil 1000 MG Caps Take 1,000 mg by mouth daily.   glimepiride 1 MG tablet Commonly known as:  AMARYL Take 2 mg by mouth daily with breakfast.   Glucosamine-Chondroitin-Vit D3 1500-1200-800 MG-MG-UNIT Pack Take 2 tablets by mouth daily.   ICAPS Caps Take 2 capsules by mouth daily.   LORazepam 0.5 MG tablet Commonly known as:  ATIVAN Take 0.5 mg by mouth every 8 (eight) hours.   metoprolol tartrate 25 MG tablet Commonly known as:  LOPRESSOR Take 1 tablet (25 mg total) by mouth 2 (two) times daily.   nitroGLYCERIN 0.4 MG SL tablet Commonly known as:  NITROSTAT Place 1 tablet (0.4 mg total) under the tongue every 5 (five) minutes x 3 doses as needed for chest pain.   polyethylene glycol packet Commonly known as:  MIRALAX / GLYCOLAX Take 17 g by mouth daily as needed for mild constipation.   simvastatin 80 MG tablet Commonly known as:  ZOCOR Take 80 mg by mouth every evening.   STOOL SOFTENER & LAXATIVE PO Take 2 tablets by mouth daily as needed (constipation).   tamsulosin 0.4 MG Caps capsule Commonly known as:  FLOMAX Take 0.4 mg by mouth daily. Pt is taking for a Kidney Infection- has about one week course left to take   VITAMIN B COMPLEX PO Take 1 tablet by mouth every evening.   vitamin B-12 500 MCG tablet Commonly known as:  CYANOCOBALAMIN Take 500 mcg by mouth daily.   vitamin C 500 MG tablet Commonly known as:  ASCORBIC ACID Take 500 mg by mouth daily.   vitamin E 100 UNIT capsule Take 400 Units by mouth daily.        PAST MEDICAL HISTORY:   Past Medical History:  Diagnosis Date  . Asymptomatic LV dysfunction    a. 2D ECHO on  12/04/2013. LV EF: 50% - 55%; G3DD, mild AR, mod MR, mild RA dilation, increased thickness of atrial septum, c/w lipomatous hypertrophy. Small patent foramen ovale. Present only with provocation from having the patient cough. Agitated saline contrast study showed a very small right-to-left atrial level shunt, in the baseline state.  Mar[place] Kitchen CAD (coronary artery disease)    a. 01/16/14 s/p DES x2 to RCA. b remote RCA stenting 1997   . Chronic kidney disease   . Depression   . DM (diabetes  mellitus) ([place])   . GERD (gastroesophageal reflux disease)   . HTN (hypertension)   . Hyperlipidemia   . Mitral regurgitation    a. mod by 11/2013 ECHO    PAST SURGICAL HISTORY:   Past Surgical History:  Procedure Laterality Date  . ANGIOPLASTY  1997  . ESOPHAGOGASTRODUODENOSCOPY ENDOSCOPY    . LEFT HEART CATHETERIZATION WITH CORONARY ANGIOGRAM N/A 01/15/2014   Procedure: LEFT HEART CATHETERIZATION WITH CORONARY ANGIOGRAM;  Surgeon: Lorretta Harp, MD;  Location: [facility] CATH LAB;  Service: Cardiovascular;  Laterality: N/A;  . PERCUTANEOUS CORONARY ROTOBLATOR INTERVENTION (PCI-R) N/A 01/16/2014   Procedure: PERCUTANEOUS CORONARY ROTOBLATOR INTERVENTION (PCI-R);  Surgeon: Troy Sine, MD;  Location: [facility] CATH LAB;  Service: Cardiovascular;  Laterality: N/A;    SOCIAL HISTORY:   Social History   Social History  . Marital status: Married    Spouse name: N/A  . Number of children: N/A  . Years of education: N/A   Occupational History  . retired Retired   Social History Main Topics  . Smoking status: Former Smoker    Packs/day: 1.50    Types: Cigarettes, Cigars    Quit date: 05/07/2013  . Smokeless tobacco: Never Used     Comment: quit cigarettes 1985 1.5 PPD, quit cigars 05/2013-- 5 cigars per day  . Alcohol use No  . Drug use: No  . Sexual activity: Not on file   Other Topics Concern  . Not on file   Social History Narrative  . No narrative on file    FAMILY HISTORY:   Family Status  Relation  Status  . Mother Deceased   Heart Attack  . Father Deceased   Heart Problems  . Sister Alive   2, Lung Cancer  . Brother Deceased   Encephalitis  . Son Alive   3, Diabetes  . Daughter Alive    ROS:  A complete 10 system review of systems was obtained and was unremarkable apart from what is mentioned above.  PHYSICAL EXAMINATION:    VITALS:   Vitals:   03/22/16 1351  BP: 136/70  Pulse: 64  Weight: 190 lb (86.2 kg)  Height: 5' 11.5" (1.816 m)    GEN:  The patient appears stated age.   HEENT:  [place]/AT.  MMM. CV:  RRR Lungs:  Clear but mild DOE (markedly improved compared to last visit)   Neurological examination:  Orientation: The patient is alert and oriented x3.  CN's:  There is good facial symmetry.  There is facial hypomimia.  Extra ocular muscles are intact.  The visual fields are full.  Speech is fluent and clear.  Soft palate rises symmetrically and there is no tongue deviation. Sensation: Sensation is intact to light touch throughout. Motor: Strength is 5/5 in the upper and lower extremity.  Movement examination: Tone: There is mild increased tone in the LUE Abnormal movements: There is rare tremor in both legs Coordination:  There is decreased finger taps, hand opening and closing on the L Gait and Station: The patient refuses to attempt to get up without using hands.   He is mildly unsteady, but he is not short stepped or festinating today.  ASSESSMENT/PLAN:  1.  idiopathic Parkinson's disease.  -He is on carbidopa/levodopa 25/100, 2/2/1.  Will remain on that. 2.  Falls  -He has not had any falls.  I would like to see him using a walker but he refuses 3.  shortness of breath  -This resolved after his cardiac stent. 4.  L  knee pain  -I think that he needs ortho opinion but he wanted to talk to PCP about it but states that he forgot at last visit. 5.  I will f/u with him in the next 4-5 months, sooner should new neurologic issues arise.

## 2016-03-22 ENCOUNTER — Ambulatory Visit (INDEPENDENT_AMBULATORY_CARE_PROVIDER_SITE_OTHER): Payer: Medicare Other | Admitting: Neurology

## 2016-03-22 ENCOUNTER — Encounter: Payer: Self-pay | Admitting: Neurology

## 2016-03-22 VITALS — BP 136/70 | HR 64 | Ht 71.5 in | Wt 190.0 lb

## 2016-03-22 DIAGNOSIS — G20 Parkinson's disease: Secondary | ICD-10-CM | POA: Diagnosis not present

## 2016-03-22 DIAGNOSIS — G8929 Other chronic pain: Secondary | ICD-10-CM | POA: Diagnosis not present

## 2016-03-22 DIAGNOSIS — M25562 Pain in left knee: Secondary | ICD-10-CM | POA: Diagnosis not present

## 2016-05-26 ENCOUNTER — Telehealth: Payer: Self-pay | Admitting: Cardiovascular Disease

## 2016-05-26 NOTE — Telephone Encounter (Signed)
OK to interrupt anti platelet Rx for 1 week prior to dental work

## 2016-05-26 NOTE — Telephone Encounter (Signed)
7 days

## 2016-05-26 NOTE — Telephone Encounter (Signed)
Ronald Benitez is having some dental work and he wants to know when to stop his Plavix and for how long? Please call. Thanks.

## 2016-05-26 NOTE — Telephone Encounter (Signed)
Patient had a crown that broke off at the gum. Patient will have to have this tooth extracted.   Will route message to MD and patient aware he will receive a call back

## 2016-05-26 NOTE — Telephone Encounter (Signed)
Pt said he forgot to ask about stopping his aspirin before he have his tooth extracted.How many days does he need to stop this?

## 2016-05-26 NOTE — Telephone Encounter (Signed)
Spoke with pt, aware of dr berry's recommendations. 

## 2016-05-26 NOTE — Telephone Encounter (Signed)
Patient notified that MD OK'ed holding plavix for dental work.

## 2016-06-04 ENCOUNTER — Other Ambulatory Visit: Payer: Self-pay | Admitting: Neurology

## 2016-06-04 MED ORDER — CARBIDOPA-LEVODOPA 25-100 MG PO TABS: ORAL_TABLET | ORAL | 1 refills | Status: DC

## 2016-06-29 ENCOUNTER — Ambulatory Visit: Payer: Medicare Other | Admitting: Physical Therapy

## 2016-06-29 ENCOUNTER — Ambulatory Visit: Payer: Medicare Other

## 2016-06-29 ENCOUNTER — Ambulatory Visit: Payer: Medicare Other | Attending: Internal Medicine | Admitting: Occupational Therapy

## 2016-06-29 DIAGNOSIS — R29818 Other symptoms and signs involving the nervous system: Secondary | ICD-10-CM | POA: Insufficient documentation

## 2016-06-29 DIAGNOSIS — R471 Dysarthria and anarthria: Secondary | ICD-10-CM

## 2016-06-29 NOTE — Therapy (Signed)
[facility] [address] [place], [place], [address] Phone: [phone]   Fax:  [phone]  Patient Details  Name: Ronald Benitez MRN: [id number] Date of Birth: 12/23/1928 Referring Provider:  [name],*  Encounter Date: 06/29/2016  Occupational Therapy Parkinson's Disease Screen  Hand dominance:  right   Physical Performance Test item #4 (donning/doffing jacket):  30.91 sec with his jacket (unable with gown due to bradykinesia--didn't pull over shoulder, difficulty putting 2nd arm in sleeve)  9-hole peg test:    RUE  31.28 sec        LUE  57.97 sec  Box & Blocks Test:   RUE  42 blocks        LUE  34 blocks  Change in ability to perform ADLs/IADLs:  Pt reports difficulty with writing, buttons, donning jacket  Pt would benefit from occupational therapy evaluation due to  Decline in coordination, incr bradykinesia for ADLs    [facility] 06/29/2016, 1:21 PM  [facility] [address] [place], [place], [address] Phone: [phone]   Fax:  [place], OTR/L [facility] [address] [place], [place]  [address] [phone] phone [phone] 06/29/16 1:22 PM

## 2016-06-29 NOTE — Therapy (Signed)
[facility] [address] [place], [place], [address] Phone: [phone]   Fax:  [phone]  Patient Details  Name: Ronald Benitez MRN: [id number] Date of Birth: 1929/04/21 Referring Provider:  [name],*  Encounter Date: 06/29/2016  Physical Therapy Parkinson's Disease Screen   Timed Up and Go test: 14.82 sec  10 meter walk test: 11.41 sec (2.87 ft/sec)  5 time sit to stand test: 16.12 sec  Patient would benefit from Physical Therapy evaluation due to slowed functional mobility measures since previous PT screen.  Pt noted to have gait instability in clinic during PT screen today.  Pt does not report any falls.      MARRIOTT,AMY W. 06/29/2016, 1:30 PM Frazier Butt., PT [facility] [address] [place], [place], [address] Phone: [phone]   Fax:  [phone]

## 2016-06-29 NOTE — Therapy (Signed)
[facility] [address] [place], [place], [address] Phone: [phone]   Fax:  [phone]  Patient Details  Name: SHAWNDELL MALLERNEE MRN: [id number] Date of Birth: 1928/10/19 Referring Provider:  Alonza Bogus, DO  Encounter Date: 06/29/2016  Speech Therapy Parkinson's Disease Screen   Decibel Level today: 70dB  (WNL=70-72 dB) with sound level meter 30cm away from pt's mouth.   Pt's speech exhibited mild dysarthria decreasing pt's speech intelligibility. He denied knowledge of this.  Pt denies difficulty in swallowing warranting objective evaluation.  Pt would benefit from speech-language eval for dysarthria - please order via EPIC or call [phone] to schedule     [facility] ,[place], CCC-SLP  06/29/2016, 2:27 PM  [facility] [address] [place], [place], [address] Phone: [phone]   Fax:  [phone]

## 2016-06-30 ENCOUNTER — Telehealth: Payer: Self-pay | Admitting: Neurology

## 2016-06-30 DIAGNOSIS — G20 Parkinson's disease: Secondary | ICD-10-CM

## 2016-06-30 NOTE — Telephone Encounter (Signed)
-----   Message from Frazier Butt, PT sent at 06/30/2016 12:07 PM EST ----- Regarding: Referral Request Mr. Rigdon was seen by OT, PT, and speech therapy for PD screen in our clinic yesterday.  He would benefit from referrals for OT to address slowed/difficulty with ADLs, PT for slowed functional mobility, and and speech for slurred speech/intelligibility.  If you agree, could you please send referral for these therapies?  Once received, someone from our office will contact patient to schedule.  Thank you.  Mady Haagensen, PT

## 2016-06-30 NOTE — Telephone Encounter (Signed)
Referral entered  

## 2016-08-03 ENCOUNTER — Ambulatory Visit: Payer: Medicare Other | Admitting: Occupational Therapy

## 2016-08-03 ENCOUNTER — Ambulatory Visit: Payer: Medicare Other | Attending: Internal Medicine | Admitting: *Deleted

## 2016-08-03 ENCOUNTER — Ambulatory Visit: Payer: Medicare Other | Admitting: Physical Therapy

## 2016-08-03 DIAGNOSIS — M25611 Stiffness of right shoulder, not elsewhere classified: Secondary | ICD-10-CM

## 2016-08-03 DIAGNOSIS — R293 Abnormal posture: Secondary | ICD-10-CM | POA: Diagnosis present

## 2016-08-03 DIAGNOSIS — R2689 Other abnormalities of gait and mobility: Secondary | ICD-10-CM

## 2016-08-03 DIAGNOSIS — R29818 Other symptoms and signs involving the nervous system: Secondary | ICD-10-CM

## 2016-08-03 DIAGNOSIS — R41841 Cognitive communication deficit: Secondary | ICD-10-CM | POA: Diagnosis not present

## 2016-08-03 DIAGNOSIS — R29898 Other symptoms and signs involving the musculoskeletal system: Secondary | ICD-10-CM

## 2016-08-03 DIAGNOSIS — G8929 Other chronic pain: Secondary | ICD-10-CM

## 2016-08-03 DIAGNOSIS — M25512 Pain in left shoulder: Secondary | ICD-10-CM

## 2016-08-03 DIAGNOSIS — R278 Other lack of coordination: Secondary | ICD-10-CM | POA: Diagnosis present

## 2016-08-03 DIAGNOSIS — M25511 Pain in right shoulder: Secondary | ICD-10-CM | POA: Insufficient documentation

## 2016-08-03 DIAGNOSIS — M25612 Stiffness of left shoulder, not elsewhere classified: Secondary | ICD-10-CM | POA: Diagnosis present

## 2016-08-03 DIAGNOSIS — R2681 Unsteadiness on feet: Secondary | ICD-10-CM

## 2016-08-03 DIAGNOSIS — R41844 Frontal lobe and executive function deficit: Secondary | ICD-10-CM | POA: Insufficient documentation

## 2016-08-03 NOTE — Therapy (Signed)
[facility] [address] [place], [place], [address] Phone: [phone]   Fax:  [phone]  Occupational Therapy Evaluation  Patient Details  Name: Ronald Benitez MRN: [id number] Date of Birth: 1928-12-23 Referring Provider: Dr. Wells Guiles Tat  Encounter Date: 08/03/2016      OT End of Session - 08/03/16 1216    Visit Number 1   Number of Visits 17   Date for OT Re-Evaluation 10/02/16   Authorization Type [employer], no visit limit, no auth; G code needed   Authorization Time Period anticipate 6-8 wks needed   Authorization - Visit Number 1   Authorization - Number of Visits 10   OT Start Time 1110   OT Stop Time 1149   OT Time Calculation (min) 39 min   Activity Tolerance Patient tolerated treatment well   Behavior During Therapy Flat affect;WFL for tasks assessed/performed      Past Medical History:  Diagnosis Date  . Asymptomatic LV dysfunction    a. 2D ECHO on 12/04/2013. LV EF: 50% - 55%; G3DD, mild AR, mod MR, mild RA dilation, increased thickness of atrial septum, c/w lipomatous hypertrophy. Small patent foramen ovale. Present only with provocation from having the patient cough. Agitated saline contrast study showed a very small right-to-left atrial level shunt, in the baseline state.  Marland Kitchen CAD (coronary artery disease)    a. 01/16/14 s/p DES x2 to RCA. b remote RCA stenting 1997   . Chronic kidney disease   . Depression   . DM (diabetes mellitus) ([place])   . GERD (gastroesophageal reflux disease)   . HTN (hypertension)   . Hyperlipidemia   . Mitral regurgitation    a. mod by 11/2013 ECHO    Past Surgical History:  Procedure Laterality Date  . ANGIOPLASTY  1997  . ESOPHAGOGASTRODUODENOSCOPY ENDOSCOPY    . LEFT HEART CATHETERIZATION WITH CORONARY ANGIOGRAM N/A 01/15/2014   Procedure: LEFT HEART CATHETERIZATION WITH CORONARY ANGIOGRAM;  Surgeon: Lorretta Harp, MD;  Location: [facility] CATH LAB;  Service:  Cardiovascular;  Laterality: N/A;  . PERCUTANEOUS CORONARY ROTOBLATOR INTERVENTION (PCI-R) N/A 01/16/2014   Procedure: PERCUTANEOUS CORONARY ROTOBLATOR INTERVENTION (PCI-R);  Surgeon: Troy Sine, MD;  Location: [facility] CATH LAB;  Service: Cardiovascular;  Laterality: N/A;    There were no vitals filed for this visit.      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 Yes   Pain Score 7    Pain Location Shoulder   Pain Orientation Right;Left   Pain Type Chronic pain   Pain Onset More than a month ago   Pain Frequency Intermittent   Aggravating Factors  abduction/ER   Pain Relieving Factors rest           [facility] OT Assessment - 08/03/16 1116      Assessment   Diagnosis Parkinson's disease   Referring Provider Dr. Wells Guiles Tat   Onset Date 06/29/16  OT screen indicated OT may be benefical   Prior Therapy previous OT, PT >2 years ago     Balance Screen   Has the patient fallen in the past 6 months Yes   How many times? 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   Lower Body Dressing Patient Percentage --  needs help for compression stockings   Toilet Tranfer --  min difficulty, grab bars   Toileting - Clothing Manipulation Modified independent   Toileting -  Theatre manager Grab bars;Walk in shower  has seat but doesn't use   Equipment Used --  has cane/RW but doesn't use   ADL comments Wife will help pulling shirt down, tucking shirt in, and jacket if pt has a appt.     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 and would like to continue     Mobility   Mobility Status History of falls   Mobility Status Comments Ambulates without device, slower with turning, freezing intermittently      Written Expression   Dominant Hand Right   Handwriting 90% legible;Mild micrographia  doesn't write much, does taxes     Vision - History   Baseline Vision Wears glasses all the time     Vision Assessment   Vision Assessment Vision not tested  pt denies difficulty     Cognition   Overall Cognitive Status Impaired/Different from baseline   Area of Impairment Safety/judgement;Awareness   Safety/Judgement Decreased awareness of safety;Decreased awareness of deficits   Memory Impaired   Memory Impairment --  per pt mild changes   Awareness Impaired    Awareness Impairment --  for PD related deficits'   Bradyphrenia Yes   Behaviors --  flat affect     Observation/Other Assessments   Observations forward head, rounded shoulders, forward lean and to the R in standing   Standing Functional Reach Test R-8", L-11"   Physical Performance Test   Yes   Simulated Eating Time (seconds) 14.28sec   Simulated Eating Comments holds spoon at end   Donning Doffing Jacket Time (seconds) 61.97sec   Donning Doffing Jacket Comments Fastening/unfastening 3 buttons in 107.09sec (Pt reports that doesn't wear button-up shirts) has had difficulty with pants button for newer pants     Coordination   9 Hole Peg Test Right;Left   Right 9 Hole Peg Test 36.97sec  Left 9 Hole Peg Test 40.15   Box and Blocks R-43blocks, L-39blocks     Tone   Assessment Location Right Upper Extremity;Left Upper Extremity     ROM / Strength   AROM / PROM / Strength AROM     AROM   Overall AROM  Deficits   Overall AROM Comments Pt reports hx of old shoulder injuries.  Decr bilateral shoulder ROM (R shoulder flex 120*, abduction 110*; L shoulder flex 120*, abduction 105*, -35* elbow ext)  Pt also demo decr ER (approx 75% with difficulty)     RUE Tone   RUE Tone Mild     LUE Tone   LUE Tone Within Functional Limits                         OT Education - 08/03/16 1206    Education provided Yes   Education Details OT eval results and POC   Person(s) Educated Patient   Methods Explanation   Comprehension Verbalized understanding  pt agrees with goals/POC          OT Short Term Goals - 08/03/16 1225      OT SHORT TERM GOAL #1   Title Pt will be independent with PD-specific HEP--check STGs 09/01/16   Time 4   Period Weeks   Status New     OT SHORT TERM GOAL #2   Title Pt will improve balance and functional reaching for ADLs as shown by improving standing functional reach by at least 2 inches with RUE.   Baseline 8"   Time 4   Period Weeks    Status New     OT SHORT TERM GOAL #3   Title Pt will improve abilty to dress as shown by improving time on PPT#4 by at least 5sec.   Baseline 61.97sec   Time 4   Period Weeks   Status New     OT SHORT TERM GOAL #4   Title Pt will be able to write numbers/simple phrases with 100% legibility and no significant decr in size in order to be able to continue to complete taxes.   Baseline 90%   Time 4   Period Weeks   Status New     OT SHORT TERM GOAL #5   Title Pt will verbalize understanding of ways to prevent future complications and PD-related community resources.   Time 4   Period Weeks   Status New           OT Long Term Goals - 08/03/16 1239      OT LONG TERM GOAL #1   Title Pt will verbalize understanding of adaptive strategies to incr ease/safety/independence with ADLs/IADL--check LTGs 10/02/16   Time 8   Period Weeks   Status New     OT LONG TERM GOAL #2   Title Pt will improve bilateral functional reaching/coordination as shown by improving score on box and blocks test by at least 4 bilaterally.   Baseline R-43 blocks, L-39 blocks   Time 8   Period Weeks   Status New     OT LONG TERM GOAL #3   Title Pt will improve abilty to dress as shown by improving time on PPT#4 by at least 10sec.   Baseline 61.97sec    Time 8   Period Weeks   Status New     OT LONG TERM GOAL #4   Title Pt will improve LUE functional reaching as shown by demo at least  110* shoulder flex and at least -25* elbow ext.   Baseline 105* shoulder flex, -25* elbow ext   Time 8   Period Weeks   Status New     OT LONG TERM GOAL #5   Title Pt will report bilateral shoulder pain less than or equal to 4/10 for ADLs.   Baseline 7/10   Time 8   Period Weeks   Status New               Plan - 08/16/2016 1218    Clinical Impression Statement Pt is an 83 y.o. male diagnosed with Parkinson's disease since 2014.  It has been >2 years since pt last had OT and PD screens indicated decline in  coordination and incr bradykinesia for ADLs.  Pt with PMH that also includes:  PD diagnosised 2014, HTN, depression, LE swelling, CAD, DM, COPD, sinus bradycardia, renal disease, hyperlipidema.  Pt presents with bradykinesia, rigidity, UE pain, decr coordination, decr balance/functional mobility, abnormal posture, decr bilateral UE ROM, and cognitive deficits affecting ADL performance.  Pt would benefit from occupational therapy to address these deficits in order to prevent future complications, re-establish PD-specific HEP, and improve ease/independence with ADLs.    Rehab Potential Fair   OT Frequency 2x / week   OT Duration 8 weeks  +evaluation (anticipate 6-8 weeks)   OT Treatment/Interventions Self-care/ADL training;Energy conservation;Manual Therapy;Cognitive remediation/compensation;Passive range of motion;Functional Mobility Training;Neuromuscular education;Cryotherapy;Therapeutic exercise;Ultrasound;Therapeutic activities;Therapeutic exercises;Balance training;Patient/family education;Splinting;DME and/or AE instruction;Moist Heat   Plan initate PWR! up, rock, twist in supine due to shoulder pain   Consulted and Agree with Plan of Care Patient      Patient will benefit from skilled therapeutic intervention in order to improve the following deficits and impairments:  Decreased coordination, Decreased range of motion, Abnormal gait, Pain, Impaired UE functional use, Decreased knowledge of use of DME, Decreased balance, Decreased cognition, Decreased mobility, Decreased strength, Impaired perceived functional ability, Impaired tone, Decreased activity tolerance, Decreased endurance, Decreased safety awareness, Improper spinal/pelvic alignment, Decreased knowledge of precautions (bradykinesia)  Visit Diagnosis: Other symptoms and signs involving the nervous system  Other symptoms and signs involving the musculoskeletal system  Frontal lobe and executive function deficit  Other lack of  coordination  Stiffness of left shoulder, not elsewhere classified  Stiffness of right shoulder, not elsewhere classified  Unsteadiness on feet  Other abnormalities of gait and mobility  Abnormal posture  Chronic right shoulder pain  Chronic left shoulder pain      G-Codes - 08-16-16 1246    Functional Assessment Tool Used (Outpatient only) PPT#4 61.97sec.   Standing functional reach R-8", L-11"   Functional Limitation Self care   Self Care Current Status [phone]) At least 40 percent but less than 60 percent impaired, limited or restricted   Self Care Goal Status [id number]) At least 20 percent but less than 40 percent impaired, limited or restricted      Problem List Patient Active Problem List   Diagnosis Date Noted  . COPD, mild ([place]) 04/09/2014  . Unstable angina ([place]) 01/17/2014  . Mitral regurgitation   . GERD (gastroesophageal reflux disease)   . DM (diabetes mellitus) ([place])   . Asymptomatic LV dysfunction   . CAD (coronary artery disease) 01/15/2014  . Abnormal nuclear cardiac imaging test 01/14/2014  . Chronic renal disease, stage III 01/14/2014  . Sinus bradycardia- HR 43 01/14/2014  . Essential hypertension 11/16/2013  . Hyperlipidemia 11/16/2013  . Depressive disorder, not elsewhere classified 05/08/2013    [facility] 08-16-2016, 12:51  PM  [facility] [address] [place], [place], [address] Phone: [phone]   Fax:  [phone]  Name: Ronald Benitez MRN: [id number] Date of Birth: August 06, 1928   [name], OTR/L [facility] [address] [place], [place]  [address] [phone] phone [phone] 08/03/16 12:51 PM

## 2016-08-03 NOTE — Therapy (Signed)
[facility] [address] [place], [place], [address] Phone: [phone]   Fax:  [phone]  Speech Language Pathology Evaluation  Patient Details  Name: Ronald Benitez MRN: [id number] Date of Birth: 12-Jul-1928 Referring Provider: Dr. Wells Guiles Tat  Encounter Date: 08/03/2016      End of Session - 08/03/16 1309    Visit Number 1   Number of Visits 12   Date for SLP Re-Evaluation 09/10/16   SLP Start Time 1150   SLP Stop Time  1220   SLP Time Calculation (min) 30 min   Activity Tolerance Patient tolerated treatment well      Past Medical History:  Diagnosis Date  . Asymptomatic LV dysfunction    a. 2D ECHO on 12/04/2013. LV EF: 50% - 55%; G3DD, mild AR, mod MR, mild RA dilation, increased thickness of atrial septum, c/w lipomatous hypertrophy. Small patent foramen ovale. Present only with provocation from having the patient cough. Agitated saline contrast study showed a very small right-to-left atrial level shunt, in the baseline state.  Marland Kitchen CAD (coronary artery disease)    a. 01/16/14 s/p DES x2 to RCA. b remote RCA stenting 1997   . Chronic kidney disease   . Depression   . DM (diabetes mellitus) ([place])   . GERD (gastroesophageal reflux disease)   . HTN (hypertension)   . Hyperlipidemia   . Mitral regurgitation    a. mod by 11/2013 ECHO    Past Surgical History:  Procedure Laterality Date  . ANGIOPLASTY  1997  . ESOPHAGOGASTRODUODENOSCOPY ENDOSCOPY    . LEFT HEART CATHETERIZATION WITH CORONARY ANGIOGRAM N/A 01/15/2014   Procedure: LEFT HEART CATHETERIZATION WITH CORONARY ANGIOGRAM;  Surgeon: Lorretta Harp, MD;  Location: [facility] CATH LAB;  Service: Cardiovascular;  Laterality: N/A;  . PERCUTANEOUS CORONARY ROTOBLATOR INTERVENTION (PCI-R) N/A 01/16/2014   Procedure: PERCUTANEOUS CORONARY ROTOBLATOR INTERVENTION (PCI-R);  Surgeon: Troy Sine, MD;  Location[facility] C[facility] LAB;  Service: Cardiovascular;  Laterality:  N/A;    There were no vitals filed for this visit.      Subjective Assessment - 08/03/16 1304    Subjective Pt reports no difficulty being understood   Currently in Pain? Yes   Pain Score 7    Pain Location Shoulder   Pain Orientation Right;Left            SLP Evaluation [facility] - 08/03/16 1304      SLP Visit Information   SLP Received On 08/03/16   Referring Provider Dr. Wells Guiles Tat   Onset Date Dx Parkinsons 05/08/13   Medical Diagnosis Parkinson's disease     Subjective   Subjective Pt pleasant and cooperative during evaluation   Patient/Family Stated Goal none stated     General Information   HPI 83 year old male referred for outpatient ST evaluation of cognition and communication.   Behavioral/Cognition --  [facility]   Mobility Status ambulates independently     Prior Functional Status   Cognitive/Linguistic Baseline Within functional limits   Education college graduate   Vocation Retired     Associate Professor   Overall Cognitive Status Within Functional Limits for tasks assessed     Auditory Comprehension   Overall Auditory Comprehension Appears within functional limits for tasks assessed     Expression   Primary Mode of Expression Verbal     Verbal Expression   Overall Verbal Expression Appears within functional limits for tasks assessed     Oral Motor/Sensory Function   Overall Oral Motor/Sensory Function  Appears within functional limits for tasks assessed     Motor Speech   Overall Motor Speech Appears within functional limits for tasks assessed     Standardized Assessments   Standardized Assessments  Montreal Cognitive Assessment (Reader)   Montreal Cognitive Assessment [facility])  COGNITION: 26/30, WFL for this assessment and pt level of education (college graduate). Points lost on delayed recall section, where pt recalled 2/5 words. Pt reports difficulty with functional recall. This will be addressed in therapy.   Other Assessment DYSPHAGIA: Pt reports no  difficulty swallowing. Pt currently tolerates regular diet and thin liquids. Pt was observed drinking water via cup sip, and exhibited no overt s/s aspiration. Pt does have a diagnosis of COPD, which increases risk of silent aspiration. Review of s/s aspiration PNA will be provided.   DYSARTHRIA: Pt is fully intelligible in quiet environment, and reports no difficulty being understood. He does not feel his speech is slurred.  LOUDNESS - production of loud /a/ was avg 78dB. Avg dB during reading aloud 75.9dB, which fall within normal limits (n=70-72dB).  BREATH SUPPORT - pt breathing was noted to be shallow, rapid, and clavicular. He did report decline in vocal strength as the day progresses. Pt reports his voice quality is unchanged, but seems slightly breathy. This may improve with proper breathing techniques. Will address diaphragmatic breathing exercises in therapy.           SLP Education - 08/03/16 1308    Education provided Yes   Education Details results of ST evaluation and recommendation for therapy   Person(s) Educated Patient   Methods Explanation;Demonstration   Comprehension Verbalized understanding          SLP Short Term Goals - 08/03/16 1356      SLP SHORT TERM GOAL #1   Title Pt will verbalize process of diaphragmatic breathing with mod cues for accuracy   Time 3   Period Weeks   Status New     SLP SHORT TERM GOAL #2   Title Pt will demonstrate diaphragmatic breathing during 2 therapy sessions   Time 3   Period Weeks   Status New     SLP SHORT TERM GOAL #3   Title Pt will identify and implement compensatory strategy for improved functional recall with mod cues for use outside of therapy   Time 3   Period Weeks   Status New          SLP Long Term Goals - 08/03/16 1358      SLP LONG TERM GOAL #1   Title Pt will verbalize the process of diaphragmatic breathing with min cues for accuracy   Time 6   Period Weeks   Status New     SLP LONG TERM GOAL #2    Title Pt will demonstrate diaphragmatic breathing during functional tasks during therapy, and report practice at home   Time 6   Period Weeks   Status New     SLP LONG TERM GOAL #3   Title Pt will report use of compensatory strategy for improved functional recall between sessions   Time 6   Period Weeks   Status New          Plan - 08/03/16 1310    Speech Therapy Frequency 2x / week   Duration --  4-6 weeks   Treatment/Interventions Functional tasks;Patient/family education;SLP instruction and feedback   Potential to Achieve Goals Good   Potential Considerations Ability to learn/carryover information;Family/community support;Previous level of function;Cooperation/participation level  SLP Home Exercise Plan diaphragmatic breathing exercises   Consulted and Agree with Plan of Care Patient      Patient will benefit from skilled therapeutic intervention in order to improve the following deficits and impairments:   Cognitive communication deficit      G-Codes - 08/05/16 1538    Functional Assessment Tool Used asha noms, clinical judgment,    Functional Limitations Other Speech Language Pathology   Other Speech-Language Pathology Functional Limitation [phone]) At least 20 percent but less than 40 percent impaired, limited or restricted  inadequate breath support      Problem List Patient Active Problem List   Diagnosis Date Noted  . COPD, mild ([place]) 04/09/2014  . Unstable angina ([place]) 01/17/2014  . Mitral regurgitation   . GERD (gastroesophageal reflux disease)   . DM (diabetes mellitus) ([place])   . Asymptomatic LV dysfunction   . CAD (coronary artery disease) 01/15/2014  . Abnormal nuclear cardiac imaging test 01/14/2014  . Chronic renal disease, stage III 01/14/2014  . Sinus bradycardia- HR 43 01/14/2014  . Essential hypertension 11/16/2013  . Hyperlipidemia 11/16/2013  . Depressive disorder, not elsewhere classified 05/08/2013   Celia B. [place], MSP,  CCC-SLP  Shonna Chock 2016-08-05, 3:42 PM  [facility] [address] [place], [place], [address] Phone: [phone]   Fax:  [phone]  Name: Ronald Benitez MRN: [id number] Date of Birth: 01-Apr-1929

## 2016-08-03 NOTE — Therapy (Signed)
[facility] [address] [place], [place], [address] Phone: [phone]   Fax:  [phone]  Physical Therapy Evaluation  Patient Details  Name: Ronald Benitez MRN: [id number] Date of Birth: October 04, 1928 Referring Provider: Alonza Bogus  Encounter Date: 08/03/2016      PT End of Session - 08/03/16 1347    Visit Number 1   Number of Visits 17   Date for PT Re-Evaluation 10/02/16   Authorization Type Blue Medicare HMO-GCODE every 10th visit   PT Start Time 1019   PT Stop Time 1059   PT Time Calculation (min) 40 min   Activity Tolerance Patient tolerated treatment well   Behavior During Therapy [facility] for tasks assessed/performed      Past Medical History:  Diagnosis Date  . Asymptomatic LV dysfunction    a. 2D ECHO on 12/04/2013. LV EF: 50% - 55%; G3DD, mild AR, mod MR, mild RA dilation, increased thickness of atrial septum, c/w lipomatous hypertrophy. Small patent foramen ovale. Present only with provocation from having the patient cough. Agitated saline contrast study showed a very small right-to-left atrial level shunt, in the baseline state.  Marland Kitchen CAD (coronary artery disease)    a. 01/16/14 s/p DES x2 to RCA. b remote RCA stenting 1997   . Chronic kidney disease   . Depression   . DM (diabetes mellitus) ([place])   . GERD (gastroesophageal reflux disease)   . HTN (hypertension)   . Hyperlipidemia   . Mitral regurgitation    a. mod by 11/2013 ECHO    Past Surgical History:  Procedure Laterality Date  . ANGIOPLASTY  1997  . ESOPHAGOGASTRODUODENOSCOPY ENDOSCOPY    . LEFT HEART CATHETERIZATION WITH CORONARY ANGIOGRAM N/A 01/15/2014   Procedure: LEFT HEART CATHETERIZATION WITH CORONARY ANGIOGRAM;  Surgeon: Lorretta Harp, MD;  Location: [facility] C[facility] LAB;  Service: Cardiovascular;  Laterality: N/A;  . PERCUTANEOUS CORONARY ROTOBLATOR INTERVENTION (PCI-R) N/A 01/16/2014   Procedure: PERCUTANEOUS CORONARY ROTOBLATOR  INTERVENTION (PCI-R);  Surgeon: Troy Sine, MD;  Location: [facility] CATH LAB;  Service: Cardiovascular;  Laterality: N/A;    There were no vitals filed for this visit.       Subjective Assessment - 08/03/16 1024    Subjective Pt reports having a fall in January, in the bathroom with foot slipping, noting increased aggravation in shoulders.  Pt had difficulty getting up from floor with son's assistance.  Pt has cane and walker, but does not use.   Patient Stated Goals Pt's goal for therapy is to be able to move easier and better.   Currently in Pain? Yes   Pain Score --  Pt does not rate   Pain Location Shoulder   Pain Orientation Right;Left   Pain Descriptors / Indicators --  "pains when I move"   Pain Type Chronic pain   Pain Onset More than a month ago   Pain Frequency Intermittent   Aggravating Factors  reaching for things   Pain Relieving Factors sitting and resting   Effect of Pain on Daily Activities PT will monitor pain, but will not address as a goal at this time.            [facility] PT Assessment - 08/03/16 1028      Assessment   Medical Diagnosis Parkinson's disease   Referring Provider Tat, Wells Guiles   Onset Date/Surgical Date 06/29/16  PD screen     Precautions   Precautions Fall  Pt is [facility]     Balance Screen  Has the patient fallen in the past 6 months Yes   How many times? 1   Has the patient had a decrease in activity level because of a fear of falling?  Yes   Is the patient reluctant to leave their home because of a fear of falling?  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[place] Enjoys going out to eat, going to church; not currently exercising     Observation/Other Assessments   Focus on Therapeutic Outcomes (FOTO)  NA     Posture/Postural Control   Posture/Postural Control Postural limitations   Postural Limitations Rounded Shoulders;Forward head;Posterior pelvic tilt;Weight shift right  R shoulder lower than L; R lateral trunk lean     ROM / Strength   AROM / PROM / Strength Strength     Strength   Overall Strength Deficits   Overall Strength Comments Grossly tested 4/5 bilateral lower extremities:  hip flexion, quads, hamstrings;  ankle dorsiflexion RLE 4/5, LLE 3+/5     Transfers   Transfers Sit to Stand;Stand to Sit   Sit to Stand 5: Supervision;Without upper extremity assist;From chair/3-in-1   Five time sit to stand comments  24.53  Forward flexed posture and flexed knees upon standing   Stand to Sit 5: Supervision;Without upper extremity assist;To chair/3-in-1     Ambulation/Gait   Ambulation/Gait Yes   Ambulation/Gait Assistance 5: Supervision   Ambulation Distance (Feet) 200 Feet   Assistive device None   Gait Pattern Step-through pattern;Decreased arm swing - left;Decreased step length - right;Decreased step length - left;Lateral trunk lean to right;Trunk flexed;Narrow base of support;Poor foot clearance - left;Poor foot clearance - right   Ambulation Surface Level;Indoor   Gait velocity 12 sec = 2.73 ft/sec   Gait Comments Pt has several episodes of staggering, near-crossing feet with gait and turns; able to self-correct     Standardized Balance Assessment   Standardized Balance Assessment Timed Up and Go Test     Timed Up and Go Test   Normal TUG (seconds) 13.9   Manual TUG (seconds) 16.59   Cognitive TUG (seconds) 16.47   TUG Comments Scores  >13.5-15 seconds indicates increased fall risk.     Functional Gait  Assessment   Gait assessed  Yes   Gait Level Surface Walks 20 ft in less than 7 sec but greater than 5.5 sec, uses assistive device, slower speed, mild gait deviations, or deviates 6-10 in outside of the 12 in walkway width.  7.37 esc   Change in Gait Speed Able to change speed, demonstrates mild gait deviations, deviates 6-10 in outside of the 12 in walkway width, or no gait deviations, unable to achieve a major change in velocity, or uses a change in velocity, or uses an assistive device.   Gait with Horizontal Head Turns Performs head turns smoothly with slight change in gait velocity (eg, minor disruption to smooth gait path),  deviates 6-10 in outside 12 in walkway width, or uses an assistive device.   Gait with Vertical Head Turns Performs task with slight change in gait velocity (eg, minor disruption to smooth gait path), deviates 6 - 10 in outside 12 in walkway width or uses assistive device   Gait and Pivot Turn Pivot turns safely within 3 sec and stops quickly with no loss of balance.   Step Over Obstacle Is able to step over one shoe box (4.5 in total height) but must slow down and adjust steps to clear box safely. May require verbal cueing.   Gait with Narrow Base of Support Ambulates less than 4 steps heel to toe or cannot perform without assistance.   Gait with Eyes Closed Walks 20 ft, slow speed, abnormal gait pattern, evidence for imbalance, deviates 10-15 in outside 12 in walkway width. Requires more than 9 sec to ambulate 20 ft.  10.53   Ambulating Backwards Walks 20 ft, uses assistive device, slower speed, mild gait deviations, deviates 6-10 in outside 12 in walkway width.  12.57   Steps Alternating feet, must use rail.   Total Score 17   FGA comment: Scores <22/30 indicate increased fall risk (previous FGA score was 21/30 06/17/14 PT )                             PT Short Term Goals -  08/03/16 1412      PT SHORT TERM GOAL #1   Title Pt will perform HEP with family supervision, for improved functional mobility, gait and transfers.  TARGET 09/01/16   Time 4   Period Weeks   Status New     PT SHORT TERM GOAL #2   Title Pt will improve 5x sit<>stand to less than or equal to 20 seconds for improved transfer efficiency and safety.   Time 4   Period Weeks   Status New     PT SHORT TERM GOAL #3   Title Pt will improve TUG cog score to less than or equal to 15 seconds for decreased fall risk.   Time 4   Period Weeks   Status New     PT SHORT TERM GOAL #4   Title Pt will improve Functional Gait Assessment to at least 20/30 for decreased fall risk.   Time 4   Period Weeks   Status New           PT Long Term Goals - 08/03/16 1416      PT LONG TERM GOAL #1   Title Pt will verbalize understanding of fall prevention in the home environment.  TARGET 10/02/16   Time 8   Period Weeks   Status New     PT LONG TERM GOAL #2   Title Pt will improve 5x sit<>stand to less than or equal to 17 seconds for improved transfer efficiency and safety.   Time 8   Status New     PT LONG TERM GOAL #3   Title Pt will improve TUG manual score to less than or equal to 14.5 seconds for decreased fall risk.   Time 8   Period Weeks   Status New     PT LONG TERM GOAL #4   Title Pt will improve FGA score to at least 23/30 for decreased fall risk, improved gait efficiency and safety.   Time 8   Period Weeks   Status New     PT LONG  TERM GOAL #5   Title Pt will verbalize plans for continued community fitness upon D/C from PT.   Time 8   Period Weeks   Status New               Plan - 08/30/2016 1348    Clinical Impression Statement Pt is an 83 year old male who presents to OP PT with history of Parkinson's disease.  Pt has >3 co-morbidities (see EPIC).  Pt was seen for PD screen in January 2018, and it was felt that due to slowed mobility overall, pt would benefit from  skilled PT evaluation.  Pt presents with decreased functional strength, slowed transfers, decreased balance, decreased timing and coordination with gait, abnormal posture, bradykinesia.  Pt would benefit from skilled PT to address the above stated deficits to improve functional mobility and decrease fall risk.   Rehab Potential Good   PT Frequency 2x / week   PT Duration 8 weeks  plus evaluation   PT Treatment/Interventions ADLs/Self Care Home Management;Functional mobility training;Gait training;Therapeutic activities;Therapeutic exercise;Balance training;Neuromuscular re-education;Patient/family education   PT Next Visit Plan Work on transfer training and gait with intensity (walking program for home); initiate HEP-either modified PWR! Moves standing or counter balance, posture and stregnthening ex (?OTAGO)   Consulted and Agree with Plan of Care Patient      Patient will benefit from skilled therapeutic intervention in order to improve the following deficits and impairments:  Abnormal gait, Decreased balance, Decreased mobility, Decreased strength, Difficulty walking, Postural dysfunction  Visit Diagnosis: Other abnormalities of gait and mobility  Other symptoms and signs involving the nervous system  Abnormal posture  Unsteadiness on feet      G-Codes - 08/30/2016 1422    Functional Assessment Tool Used (Outpatient Only) FGA 17/30, gait velocity 2.73 ft/sec, 5x sit<>stand 24.53 sec, TUG 13.9 sec, TUG man 16.59 sec, TUG cog 16.47 sec; 1 fall in past 6 months   Functional Limitation Mobility: Walking and moving around   Mobility: Walking and Moving Around Current Status [phone]) At least 40 percent but less than 60 percent impaired, limited or restricted   Mobility: Walking and Moving Around Goal Status [phone]) At least 20 percent but less than 40 percent impaired, limited or restricted       Problem List Patient Active Problem List   Diagnosis Date Noted  . COPD, mild ([place])  04/09/2014  . Unstable angina ([place]) 01/17/2014  . Mitral regurgitation   . GERD (gastroesophageal reflux disease)   . DM (diabetes mellitus) ([place])   . Asymptomatic LV dysfunction   . CAD (coronary artery disease) 01/15/2014  . Abnormal nuclear cardiac imaging test 01/14/2014  . Chronic renal disease, stage III 01/14/2014  . Sinus bradycardia- HR 43 01/14/2014  . Essential hypertension 11/16/2013  . Hyperlipidemia 11/16/2013  . Depressive disorder, not elsewhere classified 05/08/2013    Frazier Butt. 08/30/2016, 2:23 PM Frazier Butt [facility] [address] [place], [place], [address] Phone: [phone]   Fax:  [phone]  Name: Ronald Benitez MRN: [id number] Date of Birth: Feb 12, 1929

## 2016-08-11 ENCOUNTER — Encounter: Payer: Self-pay | Admitting: Physical Therapy

## 2016-08-11 ENCOUNTER — Ambulatory Visit: Payer: Medicare Other

## 2016-08-11 ENCOUNTER — Ambulatory Visit: Payer: Medicare Other | Attending: Internal Medicine | Admitting: Physical Therapy

## 2016-08-11 DIAGNOSIS — R29818 Other symptoms and signs involving the nervous system: Secondary | ICD-10-CM | POA: Diagnosis not present

## 2016-08-11 DIAGNOSIS — R41841 Cognitive communication deficit: Secondary | ICD-10-CM | POA: Insufficient documentation

## 2016-08-11 DIAGNOSIS — R293 Abnormal posture: Secondary | ICD-10-CM

## 2016-08-11 DIAGNOSIS — R471 Dysarthria and anarthria: Secondary | ICD-10-CM | POA: Diagnosis present

## 2016-08-11 DIAGNOSIS — R29898 Other symptoms and signs involving the musculoskeletal system: Secondary | ICD-10-CM | POA: Insufficient documentation

## 2016-08-11 DIAGNOSIS — M25511 Pain in right shoulder: Secondary | ICD-10-CM | POA: Diagnosis present

## 2016-08-11 DIAGNOSIS — M25612 Stiffness of left shoulder, not elsewhere classified: Secondary | ICD-10-CM | POA: Insufficient documentation

## 2016-08-11 DIAGNOSIS — M25512 Pain in left shoulder: Secondary | ICD-10-CM | POA: Insufficient documentation

## 2016-08-11 DIAGNOSIS — R278 Other lack of coordination: Secondary | ICD-10-CM | POA: Diagnosis present

## 2016-08-11 DIAGNOSIS — R2681 Unsteadiness on feet: Secondary | ICD-10-CM

## 2016-08-11 DIAGNOSIS — R2689 Other abnormalities of gait and mobility: Secondary | ICD-10-CM | POA: Insufficient documentation

## 2016-08-11 DIAGNOSIS — R41844 Frontal lobe and executive function deficit: Secondary | ICD-10-CM | POA: Diagnosis present

## 2016-08-11 DIAGNOSIS — G8929 Other chronic pain: Secondary | ICD-10-CM | POA: Diagnosis present

## 2016-08-11 DIAGNOSIS — M25611 Stiffness of right shoulder, not elsewhere classified: Secondary | ICD-10-CM | POA: Diagnosis present

## 2016-08-11 NOTE — Patient Instructions (Signed)
Given [place] exercise booklet with exercises for initial HEP highlighted. Requested pt bring booklet to each PT session for updates.

## 2016-08-11 NOTE — Therapy (Signed)
[facility] [address] [place], [place], [address] Phone: [phone]   Fax:  [phone]  Physical Therapy Treatment  Patient Details  Name: Ronald Benitez MRN: [id number] Date of Birth: 1929/04/29 Referring Provider: Alonza Bogus  Encounter Date: 08/11/2016      PT End of Session - 08/11/16 2012    Visit Number 2   Number of Visits 17   Date for PT Re-Evaluation 10/02/16   Authorization Type Blue Medicare HMO-GCODE every 10th visit   PT Start Time 0934   PT Stop Time 1014   PT Time Calculation (min) 40 min   Activity Tolerance Patient tolerated treatment well   Behavior During Therapy [facility] for tasks assessed/performed      Past Medical History:  Diagnosis Date  . Asymptomatic LV dysfunction    a. 2D ECHO on 12/04/2013. LV EF: 50% - 55%; G3DD, mild AR, mod MR, mild RA dilation, increased thickness of atrial septum, c/w lipomatous hypertrophy. Small patent foramen ovale. Present only with provocation from having the patient cough. Agitated saline contrast study showed a very small right-to-left atrial level shunt, in the baseline state.  Marland Kitchen CAD (coronary artery disease)    a. 01/16/14 s/p DES x2 to RCA. b remote RCA stenting 1997   . Chronic kidney disease   . Depression   . DM (diabetes mellitus) ([place])   . GERD (gastroesophageal reflux disease)   . HTN (hypertension)   . Hyperlipidemia   . Mitral regurgitation    a. mod by 11/2013 ECHO    Past Surgical History:  Procedure Laterality Date  . ANGIOPLASTY  1997  . ESOPHAGOGASTRODUODENOSCOPY ENDOSCOPY    . LEFT HEART CATHETERIZATION WITH CORONARY ANGIOGRAM N/A 01/15/2014   Procedure: LEFT HEART CATHETERIZATION WITH CORONARY ANGIOGRAM;  Surgeon: Lorretta Harp, MD;  Location: [facility] CATH LAB;  Service: Cardiovascular;  Laterality: N/A;  . PERCUTANEOUS CORONARY ROTOBLATOR INTERVENTION (PCI-R) N/A 01/16/2014   Procedure: PERCUTANEOUS CORONARY ROTOBLATOR  INTERVENTION (PCI-R);  Surgeon: Troy Sine, MD;  Location: [facility] CATH LAB;  Service: Cardiovascular;  Laterality: N/A;    There were no vitals filed for this visit.      Subjective Assessment - 08/11/16 0939    Subjective States he hasn't fallen. Has to watch where he steps because he can trip over anything.    Patient Stated Goals Pt's goal for therapy is to be able to move easier and better.   Currently in Pain? No/denies   Pain Onset More than a month ago                         [facility] Adult PT Treatment/Exercise - 08/11/16 0946      Transfers   Transfers Sit to Stand;Stand to Sit   Sit to Stand 5: Supervision;Without upper extremity assist;From bed   Stand to Sit 5: Supervision;Without upper extremity assist;To bed   Number of Reps 10 reps;2 sets   Comments vc for out to edge of seat; feet underneath; upright posture upon standing; controlled descent     Ambulation/Gait   Ambulation/Gait Assistance 5: Supervision   Ambulation/Gait Assistance Details vc for longer steps, heelstrike, upright posture.   Ambulation Distance (Feet) 220 Feet   Assistive device None   Gait Pattern Step-through pattern;Decreased arm swing - left;Decreased step length - right;Decreased step length - left;Lateral trunk lean to right;Trunk flexed;Narrow base of support;Poor foot clearance - left;Poor foot clearance - right   Ambulation Surface Level;Indoor  Pre-Gait Activities in // bars with beam across bars at hip level practicing forward wt-shift with hip extension "up tall" x 10 each leg   Gait Comments Pt has several episodes of staggering, near-crossing feet with gait and turns; able to self-correct     Posture/Postural Control   Posture/Postural Control Postural limitations   Postural Limitations Rounded Shoulders;Forward head;Posterior pelvic tilt;Weight shift right   Posture Comments worked on upright posture in sitting as preparing for hamstring stretch     Exercises    Exercises Knee/Hip     Knee/Hip Exercises: Stretches   Active Hamstring Stretch Both;2 reps;30 seconds   Active Hamstring Stretch Limitations seated, heel on floor, leg extended             Balance Exercises - 08/11/16 2008      Balance Exercises: Standing   Stepping Strategy Anterior;UE support;10 reps  each leg; incr difficulty pushing off to return to standing      OTAGO PROGRAM   Back Extension Standing;5 reps   Hip ABductor 10 reps  at counter   Ankle Plantorflexors --  10 reps at counter   Ankle Dorsiflexors --  10 reps at counter           PT Education - 08/11/16 2011    Education provided Yes   Education Details [place] exercises; proper posture in sit and standing and impact on gait/advancing LEs   Person(s) Educated Patient   Methods Explanation;Demonstration;Tactile cues;Verbal cues;Handout   Comprehension Verbalized understanding;Returned demonstration;Verbal cues required;Tactile cues required;Need further instruction          PT Short Term Goals - 08/03/16 1412      PT SHORT TERM GOAL #1   Title Pt will perform HEP with family supervision, for improved functional mobility, gait and transfers.  TARGET 09/01/16   Time 4   Period Weeks   Status New     PT SHORT TERM GOAL #2   Title Pt will improve 5x sit<>stand to less than or equal to 20 seconds for improved transfer efficiency and safety.   Time 4   Period Weeks   Status New     PT SHORT TERM GOAL #3   Title Pt will improve TUG cog score to less than or equal to 15 seconds for decreased fall risk.   Time 4   Period Weeks   Status New     PT SHORT TERM GOAL #4   Title Pt will improve Functional Gait Assessment to at least 20/30 for decreased fall risk.   Time 4   Period Weeks   Status New           PT Long Term Goals - 08/03/16 1416      PT LONG TERM GOAL #1   Title Pt will verbalize understanding of fall prevention in the home environment.  TARGET 10/02/16   Time 8   Period  Weeks   Status New     PT LONG TERM GOAL #2   Title Pt will improve 5x sit<>stand to less than or equal to 17 seconds for improved transfer efficiency and safety.   Time 8   Status New     PT LONG TERM GOAL #3   Title Pt will improve TUG manual score to less than or equal to 14.5 seconds for decreased fall risk.   Time 8   Period Weeks   Status New     PT LONG TERM GOAL #4   Title Pt will improve FGA score  to at least 23/30 for decreased fall risk, improved gait efficiency and safety.   Time 8   Period Weeks   Status New     PT LONG TERM GOAL #5   Title Pt will verbalize plans for continued community fitness upon D/C from PT.   Time 8   Period Weeks   Status New               Plan - 08/11/16 2013    Clinical Impression Statement Skilled session focused on intiating HEP for balance and strengthening. Session also included postural education and stretching, pre-gait, and gait training to improve safety/reduce risk of falling. Patient attentive and able to replicate desired technique with multi-modal cues. Will continue to benefit from PT to work toward [place].    Rehab Potential Good   PT Frequency 2x / week   PT Duration 8 weeks  plus evaluation   PT Treatment/Interventions ADLs/Self Care Home Management;Functional mobility training;Gait training;Therapeutic activities;Therapeutic exercise;Balance training;Neuromuscular re-education;Patient/family education   PT Next Visit Plan Work on transfer training and gait with intensity (walking program for home); continue to build HEP-began OTAGO 3/7 (check technique; add resistance band?)   Consulted and Agree with Plan of Care Patient      Patient will benefit from skilled therapeutic intervention in order to improve the following deficits and impairments:  Abnormal gait, Decreased balance, Decreased mobility, Decreased strength, Difficulty walking, Postural dysfunction  Visit Diagnosis: Other symptoms and signs involving the  nervous system  Unsteadiness on feet  Other abnormalities of gait and mobility  Abnormal posture     Problem List Patient Active Problem List   Diagnosis Date Noted  . COPD, mild ([place]) 04/09/2014  . Unstable angina ([place]) 01/17/2014  . Mitral regurgitation   . GERD (gastroesophageal reflux disease)   . DM (diabetes mellitus) ([place])   . Asymptomatic LV dysfunction   . CAD (coronary artery disease) 01/15/2014  . Abnormal nuclear cardiac imaging test 01/14/2014  . Chronic renal disease, stage III 01/14/2014  . Sinus bradycardia- HR 43 01/14/2014  . Essential hypertension 11/16/2013  . Hyperlipidemia 11/16/2013  . Depressive disorder, not elsewhere classified 05/08/2013    Rexanne Mano, PT 08/11/2016, 8:18 PM  [facility] [address] [place], [place], [address] Phone: [phone]   Fax:  [phone]  Name: Ronald Benitez MRN: [id number] Date of Birth: [birth year]

## 2016-08-11 NOTE — Therapy (Signed)
[facility] [address] [place], [place], [address] Phone: [phone]   Fax:  [phone]  Speech Language Pathology Treatment  Patient Details  Name: JERRED ZAREMBA MRN: [id number] Date of Birth: [birth year] Referring Provider: Dr. Wells Guiles Tat  Encounter Date: 08/11/2016      End of Session - 08/11/16 0943    Visit Number 2   Number of Visits 12   Date for SLP Re-Evaluation 09/10/16   SLP Start Time 0850   SLP Stop Time  0931   SLP Time Calculation (min) 41 min   Activity Tolerance Patient tolerated treatment well      Past Medical History:  Diagnosis Date  . Asymptomatic LV dysfunction    a. 2D ECHO on 12/04/2013. LV EF: 50% - 55%; G3DD, mild AR, mod MR, mild RA dilation, increased thickness of atrial septum, c/w lipomatous hypertrophy. Small patent foramen ovale. Present only with provocation from having the patient cough. Agitated saline contrast study showed a very small right-to-left atrial level shunt, in the baseline state.  Marland Kitchen CAD (coronary artery disease)    a. 01/16/14 s/p DES x2 to RCA. b remote RCA stenting 1997   . Chronic kidney disease   . Depression   . DM (diabetes mellitus) ([place])   . GERD (gastroesophageal reflux disease)   . HTN (hypertension)   . Hyperlipidemia   . Mitral regurgitation    a. mod by 11/2013 ECHO    Past Surgical History:  Procedure Laterality Date  . ANGIOPLASTY  1997  . ESOPHAGOGASTRODUODENOSCOPY ENDOSCOPY    . LEFT HEART CATHETERIZATION WITH CORONARY ANGIOGRAM N/A 01/15/2014   Procedure: LEFT HEART CATHETERIZATION WITH CORONARY ANGIOGRAM;  Surgeon: Lorretta Harp, MD;  Location: [facility] C[facility] LAB;  Service: Cardiovascular;  Laterality: N/A;  . PERCUTANEOUS CORONARY ROTOBLATOR INTERVENTION (PCI-R) N/A 01/16/2014   Procedure: PERCUTANEOUS CORONARY ROTOBLATOR INTERVENTION (PCI-R);  Surgeon: Troy Sine, MD;  Location: [facility] CATH LAB;  Service: Cardiovascular;  Laterality: N/A;     There were no vitals filed for this visit.      Subjective Assessment - 08/11/16 0903    Subjective Pt reports the abdominal breathing went well at home.   Currently in Pain? No/denies               ADULT SLP TREATMENT - 08/11/16 0913      General Information   Behavior/Cognition Alert;Cooperative;Pleasant mood;Hard of hearing     Treatment Provided   Treatment provided Cognitive-Linquistic     Cognitive-Linquistic Treatment   Treatment focused on Dysarthria   Skilled Treatment SLP having to repeat approx 25% of utterances to SLP due to hard of hearing. Pt maintained loudness at average 71dB throughtout session. SLP introduced strategies of overarticulation/"talking big" to pt today and in sentence responses req'd min-mod A occasionally for overarticulation. In conversation pt req'd mod A usually for overarticulation. Abdominal breathing      Assessment / Recommendations / Plan   Plan Continue with current plan of care     Progression Toward Goals   Progression toward goals Progressing toward goals          SLP Education - 08/11/16 0943    Education provided Yes   Education Details articulatory compensations   Person(s) Educated Patient   Methods Explanation;Demonstration;Verbal cues   Comprehension Verbalized understanding;Returned demonstration;Verbal cues required;Need further instruction          SLP Short Term Goals - 08/11/16 1020      SLP SHORT TERM GOAL #  1   Title Pt will verbalize process of diaphragmatic breathing with mod cues for accuracy   Time 3   Period Weeks   Status On-going     SLP SHORT TERM GOAL #2   Title Pt will demonstrate diaphragmatic breathing during 2 therapy sessions   Baseline 08-11-16   Time 3   Period Weeks   Status On-going     SLP SHORT TERM GOAL #3   Title Pt will identify and implement compensatory strategy for improved functional recall with mod cues for use outside of therapy   Time 3   Period Weeks   Status  On-going          SLP Long Term Goals - 08/11/16 1022      SLP LONG TERM GOAL #1   Title Pt will verbalize the process of diaphragmatic breathing with min cues for accuracy   Time 6   Period Weeks   Status On-going     SLP LONG TERM GOAL #2   Title Pt will demonstrate diaphragmatic breathing during functional tasks during therapy, and report practice at home   Time 6   Period Weeks   Status On-going     SLP LONG TERM GOAL #3   Title Pt will report use of compensatory strategy for improved functional recall between sessions   Time 6   Period Weeks   Status On-going          Plan - 08/11/16 1610    Clinical Impression Statement Pt presented today with WNL loudness, however with slightly reduced articulatory precision. His use of abdominal breathing was good (not excellent) in conversation. Suspect 2-3 more weeks prior to d/c. Skilled ST remains necessary to assist pt in carryover of clearer articulation as well as use of abdominal breathing across speaking situations.   Speech Therapy Frequency 2x / week   Duration --  4-6 weeks   Treatment/Interventions Functional tasks;Patient/family education;SLP instruction and feedback   Potential to Achieve Goals Good   Potential Considerations Ability to learn/carryover information;Family/community support;Previous level of function;Cooperation/participation level   SLP Home Exercise Plan diaphragmatic breathing exercises   Consulted and Agree with Plan of Care Patient      Patient will benefit from skilled therapeutic intervention in order to improve the following deficits and impairments:   Dysarthria and anarthria    Problem List Patient Active Problem List   Diagnosis Date Noted  . COPD, mild ([place]) 04/09/2014  . Unstable angina ([place]) 01/17/2014  . Mitral regurgitation   . GERD (gastroesophageal reflux disease)   . DM (diabetes mellitus) ([place])   . Asymptomatic LV dysfunction   . CAD (coronary artery disease) 01/15/2014   . Abnormal nuclear cardiac imaging test 01/14/2014  . Chronic renal disease, stage III 01/14/2014  . Sinus bradycardia- HR 43 01/14/2014  . Essential hypertension 11/16/2013  . Hyperlipidemia 11/16/2013  . Depressive disorder, not elsewhere classified 05/08/2013    [facility] ,[place], [place]  08/11/2016, 10:23 AM  [facility] [address] [place], [place], [address] Phone: [phone]   Fax:  [phone]   Name: YOCHANAN EDDLEMAN MRN: [id number] Date of Birth: February 13, 1929

## 2016-08-12 ENCOUNTER — Ambulatory Visit: Payer: Medicare Other | Admitting: Physical Therapy

## 2016-08-12 ENCOUNTER — Ambulatory Visit: Payer: Medicare Other | Admitting: Occupational Therapy

## 2016-08-12 ENCOUNTER — Ambulatory Visit: Payer: Medicare Other | Admitting: Speech Pathology

## 2016-08-12 DIAGNOSIS — M25511 Pain in right shoulder: Secondary | ICD-10-CM

## 2016-08-12 DIAGNOSIS — M25611 Stiffness of right shoulder, not elsewhere classified: Secondary | ICD-10-CM

## 2016-08-12 DIAGNOSIS — R29898 Other symptoms and signs involving the musculoskeletal system: Secondary | ICD-10-CM

## 2016-08-12 DIAGNOSIS — R2689 Other abnormalities of gait and mobility: Secondary | ICD-10-CM

## 2016-08-12 DIAGNOSIS — R2681 Unsteadiness on feet: Secondary | ICD-10-CM

## 2016-08-12 DIAGNOSIS — R41841 Cognitive communication deficit: Secondary | ICD-10-CM

## 2016-08-12 DIAGNOSIS — G8929 Other chronic pain: Secondary | ICD-10-CM

## 2016-08-12 DIAGNOSIS — R29818 Other symptoms and signs involving the nervous system: Secondary | ICD-10-CM | POA: Diagnosis not present

## 2016-08-12 DIAGNOSIS — R41844 Frontal lobe and executive function deficit: Secondary | ICD-10-CM

## 2016-08-12 DIAGNOSIS — M25612 Stiffness of left shoulder, not elsewhere classified: Secondary | ICD-10-CM

## 2016-08-12 DIAGNOSIS — R293 Abnormal posture: Secondary | ICD-10-CM

## 2016-08-12 DIAGNOSIS — M25512 Pain in left shoulder: Secondary | ICD-10-CM

## 2016-08-12 DIAGNOSIS — R278 Other lack of coordination: Secondary | ICD-10-CM

## 2016-08-12 DIAGNOSIS — R471 Dysarthria and anarthria: Secondary | ICD-10-CM

## 2016-08-12 NOTE — Patient Instructions (Signed)
Additions to OTAGO HEP booklet: from initial exercise of cervical rotation through forward/back walking.  Pt has OTAGO booklet and instructed to bring back to next visit.

## 2016-08-12 NOTE — Therapy (Signed)
[facility] [address] [place], [place], [address] Phone: [phone]   Fax:  [phone]  Speech Language Pathology Treatment  Patient Details  Name: Ronald Benitez MRN: [id number] Date of Birth: 15-Jun-1928 Referring Provider: Dr. Wells Guiles Tat  Encounter Date: 08/12/2016      End of Session - 08/12/16 1137    Visit Number 3   Number of Visits 12   Date for SLP Re-Evaluation 09/10/16   SLP Start Time 1104   SLP Stop Time  1145   SLP Time Calculation (min) 41 min   Activity Tolerance Patient tolerated treatment well      Past Medical History:  Diagnosis Date  . Asymptomatic LV dysfunction    a. 2D ECHO on 12/04/2013. LV EF: 50% - 55%; G3DD, mild AR, mod MR, mild RA dilation, increased thickness of atrial septum, c/w lipomatous hypertrophy. Small patent foramen ovale. Present only with provocation from having the patient cough. Agitated saline contrast study showed a very small right-to-left atrial level shunt, in the baseline state.  Marland Kitchen CAD (coronary artery disease)    a. 01/16/14 s/p DES x2 to RCA. b remote RCA stenting 1997   . Chronic kidney disease   . Depression   . DM (diabetes mellitus) ([place])   . GERD (gastroesophageal reflux disease)   . HTN (hypertension)   . Hyperlipidemia   . Mitral regurgitation    a. mod by 11/2013 ECHO    Past Surgical History:  Procedure Laterality Date  . ANGIOPLASTY  1997  . ESOPHAGOGASTRODUODENOSCOPY ENDOSCOPY    . LEFT HEART CATHETERIZATION WITH CORONARY ANGIOGRAM N/A 01/15/2014   Procedure: LEFT HEART CATHETERIZATION WITH CORONARY ANGIOGRAM;  Surgeon: Lorretta Harp, MD;  Location: [facility] CATH LAB;  Service: Cardiovascular;  Laterality: N/A;  . PERCUTANEOUS CORONARY ROTOBLATOR INTERVENTION (PCI-R) N/A 01/16/2014   Procedure: PERCUTANEOUS CORONARY ROTOBLATOR INTERVENTION (PCI-R);  Surgeon: Troy Sine, MD;  Location: [facility] CATH LAB;  Service: Cardiovascular;  Laterality: N/A;     There were no vitals filed for this visit.      Subjective Assessment - 08/12/16 1108    Subjective Pt states he's been trying to "enunciate" so that he's not "slurring my speech."   Currently in Pain? No/denies               ADULT SLP TREATMENT - 08/12/16 1109      General Information   Behavior/Cognition Alert;Cooperative;Pleasant mood;Hard of hearing   Patient Positioning Upright in chair     Treatment Provided   Treatment provided Cognitive-Linquistic     Pain Assessment   Pain Assessment No/denies pain     Cognitive-Linquistic Treatment   Treatment focused on Dysarthria   Skilled Treatment Pt maintained loudness at average above 70dB throughout session. SLP reviewed and provided feedback for use of strategies of overarticulation/"talking big". In sentence responses req'd min-mod A occasionally for overarticulation. Abdominal breathing at phrase level with 90% accuracy following initial instruction with tactile cues for reduced chest movement and tandem breathing. Progressed to sentence level with increased cognitive load with min A and 85% accuracy for AB. Continues to require min-mod cues for overarticulation. In conversation, patient required moderate cues for overarticulation.     Assessment / Recommendations / Plan   Plan Continue with current plan of care     Progression Toward Goals   Progression toward goals Progressing toward goals          SLP Education - 08/12/16 1132    Education provided  Yes   Education Details overarticulation, AB   Person(s) Educated Patient   Methods Explanation;Demonstration;Tactile cues;Verbal cues   Comprehension Verbalized understanding;Returned demonstration;Verbal cues required;Need further instruction          SLP Short Term Goals - 08/12/16 1140      SLP SHORT TERM GOAL #1   Title Pt will verbalize process of diaphragmatic breathing with mod cues for accuracy   Time 3   Period Weeks   Status On-going      SLP SHORT TERM GOAL #2   Title Pt will demonstrate diaphragmatic breathing during 2 therapy sessions   Baseline 08-11-16, 08-12-16   Time 3   Period Weeks   Status Achieved     SLP SHORT TERM GOAL #3   Title Pt will identify and implement compensatory strategy for improved functional recall with mod cues for use outside of therapy   Time 3   Period Weeks   Status On-going          SLP Long Term Goals - 08/12/16 1142      SLP LONG TERM GOAL #1   Title Pt will verbalize the process of diaphragmatic breathing with min cues for accuracy   Time 6   Period Weeks   Status On-going     SLP LONG TERM GOAL #2   Title Pt will demonstrate diaphragmatic breathing during functional tasks during therapy, and report practice at home   Time 6   Period Weeks   Status On-going     SLP LONG TERM GOAL #3   Title Pt will report use of compensatory strategy for improved functional recall between sessions   Time 6   Period Weeks   Status On-going          Plan - 08/12/16 1138    Clinical Impression Statement Pt presented today with WNL loudness, however with slightly reduced articulatory precision. His use of abdominal breathing was good (not excellent) in conversation. Suspect 2-3 more weeks prior to d/c. Skilled ST remains necessary to assist pt in carryover of clearer articulation as well as use of abdominal breathing across speaking situations.   Speech Therapy Frequency 2x / week   Treatment/Interventions Functional tasks;Patient/family education;SLP instruction and feedback   Potential to Achieve Goals Good   Potential Considerations Ability to learn/carryover information;Family/community support;Previous level of function;Cooperation/participation level   SLP Home Exercise Plan diaphragmatic breathing exercises   Consulted and Agree with Plan of Care Patient      Patient will benefit from skilled therapeutic intervention in order to improve the following deficits and impairments:    Dysarthria and anarthria  Cognitive communication deficit    Problem List Patient Active Problem List   Diagnosis Date Noted  . COPD, mild ([place]) 04/09/2014  . Unstable angina ([place]) 01/17/2014  . Mitral regurgitation   . GERD (gastroesophageal reflux disease)   . DM (diabetes mellitus) ([place])   . Asymptomatic LV dysfunction   . CAD (coronary artery disease) 01/15/2014  . Abnormal nuclear cardiac imaging test 01/14/2014  . Chronic renal disease, stage III 01/14/2014  . Sinus bradycardia- HR 43 01/14/2014  . Essential hypertension 11/16/2013  . Hyperlipidemia 11/16/2013  . Depressive disorder, not elsewhere classified 05/08/2013   Deneise Lever, [place] CF-SLP Speech-Language Pathologist  Aliene Altes 08/12/2016, 11:48 AM  [facility] [address] [place], [place], [address] Phone: [phone]   Fax:  [phone]   Name: Ronald Benitez MRN: [id number] Date of Birth: 24-Oct-1928

## 2016-08-12 NOTE — Therapy (Signed)
[facility] [address] [place], [place], [address] Phone: [phone]   Fax:  [phone]  Occupational Therapy Treatment  Patient Details  Name: Ronald Benitez MRN: [id number] Date of Birth: Oct 24, 1928 Referring Provider: Dr. Wells Guiles Tat  Encounter Date: 08/12/2016      OT End of Session - 08/12/16 1043    Visit Number 2   Number of Visits 17   Date for OT Re-Evaluation 10/02/16   Authorization Type [employer], no visit limit, no auth; G code needed   Authorization Time Period anticipate 6-8 wks needed   Authorization - Visit Number 2   Authorization - Number of Visits 10   OT Start Time 1021   OT Stop Time 1100   OT Time Calculation (min) 39 min   Activity Tolerance Patient tolerated treatment well   Behavior During Therapy Flat affect;WFL for tasks assessed/performed      Past Medical History:  Diagnosis Date  . Asymptomatic LV dysfunction    a. 2D ECHO on 12/04/2013. LV EF: 50% - 55%; G3DD, mild AR, mod MR, mild RA dilation, increased thickness of atrial septum, c/w lipomatous hypertrophy. Small patent foramen ovale. Present only with provocation from having the patient cough. Agitated saline contrast study showed a very small right-to-left atrial level shunt, in the baseline state.  Marland Kitchen CAD (coronary artery disease)    a. 01/16/14 s/p DES x2 to RCA. b remote RCA stenting 1997   . Chronic kidney disease   . Depression   . DM (diabetes mellitus) ([place])   . GERD (gastroesophageal reflux disease)   . HTN (hypertension)   . Hyperlipidemia   . Mitral regurgitation    a. mod by 11/2013 ECHO    Past Surgical History:  Procedure Laterality Date  . ANGIOPLASTY  1997  . ESOPHAGOGASTRODUODENOSCOPY ENDOSCOPY    . LEFT HEART CATHETERIZATION WITH CORONARY ANGIOGRAM N/A 01/15/2014   Procedure: LEFT HEART CATHETERIZATION WITH CORONARY ANGIOGRAM;  Surgeon: Lorretta Harp, MD;  Location[facility] CATH LAB;  Service:  Cardiovascular;  Laterality: N/A;  . PERCUTANEOUS CORONARY ROTOBLATOR INTERVENTION (PCI-R) N/A 01/16/2014   Procedure: PERCUTANEOUS CORONARY ROTOBLATOR INTERVENTION (PCI-R);  Surgeon: Troy Sine, MD;  Location: [facility] CATH LAB;  Service: Cardiovascular;  Laterality: N/A;    There were no vitals filed for this visit.      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 No/denies   Pain Onset More than a month ago       PWR! Hands (up) x10 with min cueing for incr movement amplitude (finger ext, elbow ext).  In sitting, functional reaching ER with trunk rotation/wt. Shift and overhead reaching with each UE to place large pegs in vertical pegboard with min cueing for trunk/head movement to decr rigidity/shoulder pain.  Pt demo fatigue with LUE.  Flipping cards with each hand with min cueing for large amplitude movements (finger ext, supination).     Arm bike x5 min level 1 for reciprocal movement with cues/target of at least 37rpms for intensity while maintaining movement amplitude/reciprocal movement.   Pt maintained 43+rpms.  OT Education - 08/12/16 1045    Education Details PWR! in supine (up, rock, twist)   Person(s) Educated Patient   Methods Explanation;Demonstration;Verbal cues;Tactile cues;Handout   Comprehension Verbalized understanding;Returned demonstration;Verbal cues required;Need further instruction  min-mod cueing          OT Short Term Goals - 08/03/16 1225      OT SHORT TERM GOAL #1   Title Pt will be independent with PD-specific HEP--check STGs 09/01/16   Time 4   Period Weeks   Status New     OT SHORT TERM GOAL #2   Title Pt will improve balance  and functional reaching for ADLs as shown by improving standing functional reach by at least 2 inches with RUE.   Baseline 8"   Time 4   Period Weeks   Status New     OT SHORT TERM GOAL #3   Title Pt will improve abilty to dress as shown by improving time on PPT#4 by at least 5sec.   Baseline 61.97sec   Time 4   Period Weeks   Status New     OT SHORT TERM GOAL #4   Title Pt will be able to write numbers/simple phrases with 100% legibility and no significant decr in size in order to be able to continue to complete taxes.   Baseline 90%   Time 4   Period Weeks   Status New     OT SHORT TERM GOAL #5   Title Pt will verbalize understanding of ways to prevent future complications and PD-related community resources.   Time 4   Period Weeks   Status New           OT Long Term Goals - 08/03/16 1239      OT LONG TERM GOAL #1   Title Pt will verbalize understanding of adaptive strategies to incr ease/safety/independence with ADLs/IADL--check LTGs 10/02/16   Time 8   Period Weeks   Status New     OT LONG TERM GOAL #2   Title Pt will improve bilateral functional reaching/coordination as shown by improving score on box and blocks test by at least 4 bilaterally.   Baseline R-43 blocks, L-39 blocks   Time 8   Period Weeks   Status New     OT LONG TERM GOAL #3   Title Pt will improve abilty to dress as shown by improving time on PPT#4 by at least 10sec.   Baseline 61.97sec    Time 8   Period Weeks   Status New     OT LONG TERM GOAL #4   Title Pt will improve LUE functional reaching as shown by demo at least 110* shoulder flex and at least -25* elbow ext.   Baseline 105* shoulder flex, -25* elbow ext   Time 8   Period Weeks   Status New     OT LONG TERM GOAL #5   Title Pt will report bilateral shoulder pain less than or equal to 4/10 for ADLs.   Baseline 7/10   Time 8   Period Weeks   Status New               Plan - 08/12/16 1044    Clinical Impression  Statement Pt reports no shoulder pain and incr ROM for functional reaching after PWR! supine.     Rehab Potential Fair   OT Frequency 2x / week   OT Duration 8 weeks  +evaluation (anticipate 6-8 weeks)   OT Treatment/Interventions Self-care/ADL training;Energy  conservation;Manual Therapy;Cognitive remediation/compensation;Passive range of motion;Functional Mobility Training;Neuromuscular education;Cryotherapy;Therapeutic exercise;Ultrasound;Therapeutic activities;Therapeutic exercises;Balance training;Patient/family education;Splinting;DME and/or AE instruction;Moist Heat   Plan review PWR! up, rock, twist in supine; PWR! hands   OT Home Exercise Plan Education provided:  supine PWR! (up, rock, twist)   Consulted and Agree with Plan of Care Patient      Patient will benefit from skilled therapeutic intervention in order to improve the following deficits and impairments:  Decreased coordination, Decreased range of motion, Abnormal gait, Pain, Impaired UE functional use, Decreased knowledge of use of DME, Decreased balance, Decreased cognition, Decreased mobility, Decreased strength, Impaired perceived functional ability, Impaired tone, Decreased activity tolerance, Decreased endurance, Decreased safety awareness, Improper spinal/pelvic alignment, Decreased knowledge of precautions (bradykinesia)  Visit Diagnosis: Other symptoms and signs involving the nervous system  Unsteadiness on feet  Other abnormalities of gait and mobility  Abnormal posture  Other symptoms and signs involving the musculoskeletal system  Frontal lobe and executive function deficit  Other lack of coordination  Stiffness of left shoulder, not elsewhere classified  Stiffness of right shoulder, not elsewhere classified  Chronic right shoulder pain  Chronic left shoulder pain    Problem List Patient Active Problem List   Diagnosis Date Noted  . COPD, mild ([place]) 04/09/2014  . Unstable angina ([place]) 01/17/2014   . Mitral regurgitation   . GERD (gastroesophageal reflux disease)   . DM (diabetes mellitus) ([place])   . Asymptomatic LV dysfunction   . CAD (coronary artery disease) 01/15/2014  . Abnormal nuclear cardiac imaging test 01/14/2014  . Chronic renal disease, stage III 01/14/2014  . Sinus bradycardia- HR 43 01/14/2014  . Essential hypertension 11/16/2013  . Hyperlipidemia 11/16/2013  . Depressive disorder, not elsewhere classified 05/08/2013    [facility] 08/12/2016, 10:48 AM  [facility] [address] [place], [place], [address] Phone: [phone]   Fax:  [phone]  Name: Ronald Benitez MRN: [id number] Date of Birth: [birth year]   [name], OTR/L [facility] [address] [place], [place]  [address] [phone] phone [phone] 08/12/16 10:48 AM

## 2016-08-12 NOTE — Therapy (Signed)
[place] [address] [place], [place], [address] Phone: [phone]   Fax:  [phone]  Physical Therapy Treatment  Patient Details  Name: Ronald Benitez MRN: [id number] Date of Birth: [birth year] Referring Provider: Alonza Bogus  Encounter Date: 08/12/2016      PT End of Session - 08/12/16 1348    Visit Number 3   Number of Visits 17   Date for PT Re-Evaluation 10/02/16   Authorization Type [employer] HMO-GCODE every 10th visit   PT Start Time 0930   PT Stop Time 1014   PT Time Calculation (min) 44 min   Activity Tolerance Patient tolerated treatment well   Behavior During Therapy [facility] for tasks assessed/performed      Past Medical History:  Diagnosis Date  . Asymptomatic LV dysfunction    a. 2D ECHO on 12/04/2013. LV EF: 50% - 55%; G3DD, mild AR, mod MR, mild RA dilation, increased thickness of atrial septum, c/w lipomatous hypertrophy. Small patent foramen ovale. Present only with provocation from having the patient cough. Agitated saline contrast study showed a very small right-to-left atrial level shunt, in the baseline state.  Marland Kitchen CAD (coronary artery disease)    a. 01/16/14 s/p DES x2 to RCA. b remote RCA stenting 1997   . Chronic kidney disease   . Depression   . DM (diabetes mellitus) ([place])   . GERD (gastroesophageal reflux disease)   . HTN (hypertension)   . Hyperlipidemia   . Mitral regurgitation    a. mod by 11/2013 ECHO    Past Surgical History:  Procedure Laterality Date  . ANGIOPLASTY  1997  . ESOPHAGOGASTRODUODENOSCOPY ENDOSCOPY    . LEFT HEART CATHETERIZATION WITH CORONARY ANGIOGRAM N/A 01/15/2014   Procedure: LEFT HEART CATHETERIZATION WITH CORONARY ANGIOGRAM;  Surgeon: Lorretta Harp, MD;  Location: [facility] CATH LAB;  Service: Cardiovascular;  Laterality: N/A;  . PERCUTANEOUS CORONARY ROTOBLATOR INTERVENTION (PCI-R) N/A 01/16/2014   Procedure: PERCUTANEOUS CORONARY ROTOBLATOR  INTERVENTION (PCI-R);  Surgeon: Troy Sine, MD;  Location: [facility] CATH LAB;  Service: Cardiovascular;  Laterality: N/A;    There were no vitals filed for this visit.      Subjective Assessment - 08/12/16 0933    Subjective No changes since being here yesterday.   Patient Stated Goals Pt's goal for therapy is to be able to move easier and better.   Currently in Pain? No/denies   Pain Onset More than a month ago                         [facility] Adult PT Treatment/Exercise - 08/12/16 0001      Knee/Hip Exercises: Aerobic   Stepper SciFit seated stepper, Level 1.4, x 6 minutes, cues for RPM >60-70 for increased intensity of movement             Balance Exercises - 08/12/16 0935      OTAGO PROGRAM   Head Movements Standing;5 reps   Neck Movements Standing;5 reps   Back Extension Standing;5 reps   Trunk Movements Standing;5 reps   Ankle Movements Sitting;10 reps   Knee Extensor 10 reps;Other reps (comment);Weight (comment)  2nd set of 10, 2# weight   Knee Flexor 10 reps   Hip ABductor 10 reps  facing counter for improved stability   Ankle Plantorflexors 20 reps, support   Ankle Dorsiflexors 20 reps, support   Knee Bends 10 reps, support   Backwards Walking Support  3 reps along counter-forward  and back   Walking and Turning Around No assistive device  supervision/min guard assistance   Sideways Walking No assistive device  intermittent UE support-cues for hip positioning   Overall OTAGO Comments Review of exercises given last visit, with pt return demo understanding; additions to [place] made this visit.           PT Education - 08/12/16 1348    Education provided Yes   Education Details OTAGO HEP additions   Person(s) Educated Patient   Methods Explanation;Demonstration;Handout   Comprehension Verbalized understanding;Returned demonstration;Verbal cues required          PT Short Term Goals - 08/03/16 1412      PT SHORT TERM GOAL #1    Title Pt will perform HEP with family supervision, for improved functional mobility, gait and transfers.  TARGET 09/01/16   Time 4   Period Weeks   Status New     PT SHORT TERM GOAL #2   Title Pt will improve 5x sit<>stand to less than or equal to 20 seconds for improved transfer efficiency and safety.   Time 4   Period Weeks   Status New     PT SHORT TERM GOAL #3   Title Pt will improve TUG cog score to less than or equal to 15 seconds for decreased fall risk.   Time 4   Period Weeks   Status New     PT SHORT TERM GOAL #4   Title Pt will improve Functional Gait Assessment to at least 20/30 for decreased fall risk.   Time 4   Period Weeks   Status New           PT Long Term Goals - 08/03/16 1416      PT LONG TERM GOAL #1   Title Pt will verbalize understanding of fall prevention in the home environment.  TARGET 10/02/16   Time 8   Period Weeks   Status New     PT LONG TERM GOAL #2   Title Pt will improve 5x sit<>stand to less than or equal to 17 seconds for improved transfer efficiency and safety.   Time 8   Status New     PT LONG TERM GOAL #3   Title Pt will improve TUG manual score to less than or equal to 14.5 seconds for decreased fall risk.   Time 8   Period Weeks   Status New     PT LONG TERM GOAL #4   Title Pt will improve FGA score to at least 23/30 for decreased fall risk, improved gait efficiency and safety.   Time 8   Period Weeks   Status New     PT LONG TERM GOAL #5   Title Pt will verbalize plans for continued community fitness upon D/C from PT.   Time 8   Period Weeks   Status New               Plan - 08/12/16 1348    Clinical Impression Statement Additions made to [place] HEP this visit.  Pt seems to be interested in and motivated to perform these exercises, with likely addition of ankle weights per instructions in next several visits.  Pt will continue to benefit from skilled PT to address gait, posture, balance and strengthening.    Rehab Potential Good   PT Frequency 2x / week   PT Duration 8 weeks  plus evaluation   PT Treatment/Interventions ADLs/Self Care Home Management;Functional mobility training;Gait training;Therapeutic activities;Therapeutic  exercise;Balance training;Neuromuscular re-education;Patient/family education   PT Next Visit Plan Review OTAGO HEP and add additional exercises as appropriate.  Walking program for home-add to HEP; continue to work on gait intensity and transfer training   Consulted and Agree with Plan of Care Patient      Patient will benefit from skilled therapeutic intervention in order to improve the following deficits and impairments:  Abnormal gait, Decreased balance, Decreased mobility, Decreased strength, Difficulty walking, Postural dysfunction  Visit Diagnosis: Abnormal posture  Unsteadiness on feet  Other abnormalities of gait and mobility     Problem List Patient Active Problem List   Diagnosis Date Noted  . COPD, mild ([place]) 04/09/2014  . Unstable angina ([place]) 01/17/2014  . Mitral regurgitation   . GERD (gastroesophageal reflux disease)   . DM (diabetes mellitus) ([place])   . Asymptomatic LV dysfunction   . CAD (coronary artery disease) 01/15/2014  . Abnormal nuclear cardiac imaging test 01/14/2014  . Chronic renal disease, stage III 01/14/2014  . Sinus bradycardia- HR 43 01/14/2014  . Essential hypertension 11/16/2013  . Hyperlipidemia 11/16/2013  . Depressive disorder, not elsewhere classified 05/08/2013    Florence Yeung W. 08/12/2016, 1:52 PM  Frazier Butt., PT  [place] [address] [place], [place], [address] Phone: [phone]   Fax:  [phone]  Name: JAXIEL KINES MRN: [id number] Date of Birth: [birth year]

## 2016-08-16 ENCOUNTER — Ambulatory Visit: Payer: Medicare Other | Admitting: Physical Therapy

## 2016-08-16 ENCOUNTER — Ambulatory Visit: Payer: Medicare Other | Admitting: Occupational Therapy

## 2016-08-19 ENCOUNTER — Ambulatory Visit: Payer: Medicare Other | Admitting: Physical Therapy

## 2016-08-19 ENCOUNTER — Ambulatory Visit: Payer: Medicare Other | Admitting: Occupational Therapy

## 2016-08-19 ENCOUNTER — Encounter: Payer: Self-pay | Admitting: Physical Therapy

## 2016-08-19 DIAGNOSIS — G8929 Other chronic pain: Secondary | ICD-10-CM

## 2016-08-19 DIAGNOSIS — R293 Abnormal posture: Secondary | ICD-10-CM

## 2016-08-19 DIAGNOSIS — M25612 Stiffness of left shoulder, not elsewhere classified: Secondary | ICD-10-CM

## 2016-08-19 DIAGNOSIS — M25611 Stiffness of right shoulder, not elsewhere classified: Secondary | ICD-10-CM

## 2016-08-19 DIAGNOSIS — R2689 Other abnormalities of gait and mobility: Secondary | ICD-10-CM

## 2016-08-19 DIAGNOSIS — M25511 Pain in right shoulder: Secondary | ICD-10-CM

## 2016-08-19 DIAGNOSIS — R2681 Unsteadiness on feet: Secondary | ICD-10-CM

## 2016-08-19 DIAGNOSIS — R29818 Other symptoms and signs involving the nervous system: Secondary | ICD-10-CM | POA: Diagnosis not present

## 2016-08-19 DIAGNOSIS — M25512 Pain in left shoulder: Secondary | ICD-10-CM

## 2016-08-19 DIAGNOSIS — R278 Other lack of coordination: Secondary | ICD-10-CM

## 2016-08-19 DIAGNOSIS — R41844 Frontal lobe and executive function deficit: Secondary | ICD-10-CM

## 2016-08-19 DIAGNOSIS — R29898 Other symptoms and signs involving the musculoskeletal system: Secondary | ICD-10-CM

## 2016-08-19 NOTE — Therapy (Signed)
[place] [address] [place], [place], [address] Phone: [phone]   Fax:  [phone]  Physical Therapy Treatment  Patient Details  Name: Ronald Benitez MRN: [id number] Date of Birth: June 14, 1928 Referring Provider: Alonza Bogus  Encounter Date: 08/19/2016      PT End of Session - 08/19/16 1157    Visit Number 4   Number of Visits 17   Date for PT Re-Evaluation 10/02/16   Authorization Type Blue [employer] HMO-GCODE every 10th visit   PT Start Time 1106   PT Stop Time 1145   PT Time Calculation (min) 39 min   Activity Tolerance Patient tolerated treatment well   Behavior During Therapy Flat affect;WFL for tasks assessed/performed      Past Medical History:  Diagnosis Date  . Asymptomatic LV dysfunction    a. 2D ECHO on 12/04/2013. LV EF: 50% - 55%; G3DD, mild AR, mod MR, mild RA dilation, increased thickness of atrial septum, c/w lipomatous hypertrophy. Small patent foramen ovale. Present only with provocation from having the patient cough. Agitated saline contrast study showed a very small right-to-left atrial level shunt, in the baseline state.  Marland Kitchen CAD (coronary artery disease)    a. 01/16/14 s/p DES x2 to RCA. b remote RCA stenting 1997   . Chronic kidney disease   . Depression   . DM (diabetes mellitus) ([place])   . GERD (gastroesophageal reflux disease)   . HTN (hypertension)   . Hyperlipidemia   . Mitral regurgitation    a. mod by 11/2013 ECHO    Past Surgical History:  Procedure Laterality Date  . ANGIOPLASTY  1997  . ESOPHAGOGASTRODUODENOSCOPY ENDOSCOPY    . LEFT HEART CATHETERIZATION WITH CORONARY ANGIOGRAM N/A 01/15/2014   Procedure: LEFT HEART CATHETERIZATION WITH CORONARY ANGIOGRAM;  Surgeon: Lorretta Harp, MD;  Location: [facility] CATH LAB;  Service: Cardiovascular;  Laterality: N/A;  . PERCUTANEOUS CORONARY ROTOBLATOR INTERVENTION (PCI-R) N/A 01/16/2014   Procedure: PERCUTANEOUS CORONARY  ROTOBLATOR INTERVENTION (PCI-R);  Surgeon: Troy Sine, MD;  Location: [facility] CATH LAB;  Service: Cardiovascular;  Laterality: N/A;    There were no vitals filed for this visit.      Subjective Assessment - 08/19/16 1108    Subjective Pt had a fall in the bathroom at home when he was going to sit. Pt has arthritis in R knee and stopped walking exercise routine.   Currently in Pain? Yes   Pain Score 6    Pain Location Shoulder   Pain Orientation Right;Left   Pain Descriptors / Indicators Sharp   Pain Type Acute pain;Chronic pain   Pain Onset In the past 7 days                         [facility] Adult PT Treatment/Exercise - 08/19/16 0001      Knee/Hip Exercises: Aerobic   Nustep Level 3, seat 13, 73min             Balance Exercises - 08/19/16 1113      OTAGO PROGRAM   Backwards Walking No support   Walking and Turning Around No assistive device   Sideways Walking No assistive device   Tandem Stance 10 seconds, support   Tandem Walk Support   One Leg Stand 10 seconds, support   Heel Walking Support   Toe Walk Support   Heel Toe Walking Backward No support  With support   Sit to Stand 10 reps, no support  Overall OTAGO Comments not given as HEP as of 08/19/16 rom Walking with turns to the end.             PT Short Term Goals - 08/03/16 1412      PT SHORT TERM GOAL #1   Title Pt will perform HEP with family supervision, for improved functional mobility, gait and transfers.  TARGET 09/01/16   Time 4   Period Weeks   Status New     PT SHORT TERM GOAL #2   Title Pt will improve 5x sit<>stand to less than or equal to 20 seconds for improved transfer efficiency and safety.   Time 4   Period Weeks   Status New     PT SHORT TERM GOAL #3   Title Pt will improve TUG cog score to less than or equal to 15 seconds for decreased fall risk.   Time 4   Period Weeks   Status New     PT SHORT TERM GOAL #4   Title Pt will improve Functional Gait Assessment  to at least 20/30 for decreased fall risk.   Time 4   Period Weeks   Status New           PT Long Term Goals - 08/03/16 1416      PT LONG TERM GOAL #1   Title Pt will verbalize understanding of fall prevention in the home environment.  TARGET 10/02/16   Time 8   Period Weeks   Status New     PT LONG TERM GOAL #2   Title Pt will improve 5x sit<>stand to less than or equal to 17 seconds for improved transfer efficiency and safety.   Time 8   Status New     PT LONG TERM GOAL #3   Title Pt will improve TUG manual score to less than or equal to 14.5 seconds for decreased fall risk.   Time 8   Period Weeks   Status New     PT LONG TERM GOAL #4   Title Pt will improve FGA score to at least 23/30 for decreased fall risk, improved gait efficiency and safety.   Time 8   Period Weeks   Status New     PT LONG TERM GOAL #5   Title Pt will verbalize plans for continued community fitness upon D/C from PT.   Time 8   Period Weeks   Status New               Plan - 08/19/16 1141    Clinical Impression Statement Did not add to Kizzie Bane today but practised the balance exercises in the program; pt required at least 1 UE support at supervision level, self correcting imbalance with min cues. Pt does not have pain in R knee on Nu step; trialled for option  for cardiovascular exercis; pt is currently not interested in going to a fitness center.                                                              Rehab Potential Good   PT Frequency 2x / week   PT Duration 8 weeks   PT Treatment/Interventions ADLs/Self Care Home Management;Functional mobility training;Gait training;Therapeutic activities;Therapeutic exercise;Balance training;Neuromuscular re-education;Patient/family education   PT Next Visit Plan Review  OTAGO HEP and add additional exercises as appropriate.  Walking program for home-add to HEP; continue to work on gait intensity and transfer training      Patient will benefit  from skilled therapeutic intervention in order to improve the following deficits and impairments:  Abnormal gait, Decreased balance, Decreased mobility, Decreased strength, Difficulty walking, Postural dysfunction  Visit Diagnosis: Abnormal posture  Unsteadiness on feet  Other abnormalities of gait and mobility     Problem List Patient Active Problem List   Diagnosis Date Noted  . COPD, mild ([place]) 04/09/2014  . Unstable angina ([place]) 01/17/2014  . Mitral regurgitation   . GERD (gastroesophageal reflux disease)   . DM (diabetes mellitus) ([place])   . Asymptomatic LV dysfunction   . CAD (coronary artery disease) 01/15/2014  . Abnormal nuclear cardiac imaging test 01/14/2014  . Chronic renal disease, stage III 01/14/2014  . Sinus bradycardia- HR 43 01/14/2014  . Essential hypertension 11/16/2013  . Hyperlipidemia 11/16/2013  . Depressive disorder, not elsewhere classified 05/08/2013    Bjorn Loser, PTA  08/19/16, 12:01 PM  [facility] [place] [address] [place], [place], [address] Phone: [phone]   Fax:  [phone]  Name: IWAO SHAMBLIN MRN: [id number] Date of Birth: [birth year]

## 2016-08-19 NOTE — Progress Notes (Signed)
Ronald Benitez was seen today in the movement disorders clinic for neurologic consultation at the request of [name], MD.  The patient is accompanied in the office today by his wife, who supplements the history.  Patient was diagnosed with Parkinson's disease on 05/08/2013.  Last visit, we started the patient on carbidopa/levodopa 25/100, one tablet 3 times per day.  The patient was also enrolled in the Parkinson's disease therapy program.  He does think that the therapy helped.  He is not sure of the levodopa has been helpful.  He is not doing faithful cardiovascular exercise.  He has fallen one time.  He tripped over a rug.  No hallucinations.  No lightheadedness or near syncope.  10/16/13 update:  The patient is following up regarding Parkinson's disease.  He is accompanied by his wife who helps to supplement the history.  He is currently on carbidopa/levodopa 25/100, one tablet 3 times per day.  He has a history of falls, which are multifactorial, likely due to Parkinson's disease as well as peripheral neuropathy.  No falls since last visit but had near falls.  He is off of the desipramine. Pt not sure if the carbidopa/levodopa 25/100 working.   Having a hard time getting out of the chair.  He seems more unstable.  He has developed a cough.  He has noticed some shortness of breath.  His wife states that he moans all of the time, but the patient cannot state why he does that.  Last visit to PCP was in February.  05/20/15 update:  The patient follows up today in the movement disorder clinic.  This patient is accompanied in the office by his spouse who supplements the history.  I have not seen him since May, 2015.  I have reviewed an extensive number of records made available to me.  At last visit I increased his carbidopa/levodopa 25/100 from one tablet 3 times a day to 2 tablets in the morning, 2 in the afternoon and one in the evening.  I also referred him to rehabilitation.  I was  also concerned about shortness of breath since last visit and asked his primary care physician to see him that same day, which he did.  He subsequently saw cardiology and had a Myoview done in July, 2015 which was an intermediate risk study with significant reversible lateral apical defect noted.  Following this, a cardiac cath was recommended and was completed in August, 2015 at which point a 95% stenosis was identified and subsequently stented.  Pt reports that he is less SOB.  No falls.  He is trying to do some walking but states that he has a "bad knee" which limits exercise.  No hallucinations.  11/18/15 update:  The patient follows up today in the movement disorder clinic.  He is on carbidopa/levodopa 25/100 2 tablets in the morning, 2 in the afternoon and one in the evening.  He has done well over the last 6 months.  No falls.  No lightheadedness or near syncope.  No hallucinations.  States that he is going to the neurorehab center soon for a screen.  States that his L knee is very painful and he "can barely walk."    03/22/16 update:  The patient f/u regarding his Parkinson's disease.  He remains on carbidopa/levodopa 25/100, 2 tablets in the morning, 2 in the afternoon and one in the evening.  He denies any falls.  He does have to be more careful on stairs.  Can't  exercise because of knee pain.  No hallucinations.  No lightheadedness or near syncope.  08/20/16 update:  Patient follows up today accompanied by her wife who supplements the history.  He is on carbidopa/levodopa 25/100, 2 tablets in the morning, 2 in the afternoon and one in the evening.  Pt fell in bathroom on Monday and hurt his shoulder.  This was second fall in bathroom this year.  No lightheadedness or near syncope.  Mood has been good.  No hallucinations.  Currently involved in rehabilitation therapies and I have reviewed those notes.  They are working with his shoulder in rehab.     PREVIOUS MEDICATIONS: none to date  ALLERGIES:   No Known Allergies  CURRENT MEDICATIONS:   Current Outpatient Prescriptions on File Prior to Visit  Medication Sig Dispense Refill  . aspirin EC 81 MG EC tablet Take 1 tablet (81 mg total) by mouth daily.    . B Complex Vitamins (VITAMIN B COMPLEX PO) Take 1 tablet by mouth every evening.    . captopril (CAPOTEN) 12.5 MG tablet Take 6.25 mg by mouth 2 (two) times daily.     . carbidopa-levodopa (SINEMET IR) 25-100 MG tablet 2 in the morning, 2 in the afternoon, 1 in the evening 450 tablet 1  . clopidogrel (PLAVIX) 75 MG tablet Take 1 tablet (75 mg total) by mouth daily with breakfast. 30 tablet 11  . co-enzyme Q-10 30 MG capsule Take 30 mg by mouth 3 (three) times daily.    Marland Kitchen desipramine (NORPRAMIN) 50 MG tablet Take 100 mg by mouth at bedtime.    . finasteride (PROSCAR) 5 MG tablet Take 5 mg by mouth daily.    . furosemide (LASIX) 20 MG tablet Take 20 mg by mouth.    Marland Kitchen glimepiride (AMARYL) 1 MG tablet Take 2 mg by mouth daily with breakfast.     . Glucosamine-Chondroitin-Vit D3 1500-1200-800 MG-MG-UNIT PACK Take 2 tablets by mouth daily.    Marland Kitchen LORazepam (ATIVAN) 0.5 MG tablet Take 0.5 mg by mouth every 8 (eight) hours.    . metoprolol (LOPRESSOR) 25 MG tablet Take 1 tablet (25 mg total) by mouth 2 (two) times daily. 60 tablet 11  . Multiple Vitamins-Minerals (ICAPS) CAPS Take 2 capsules by mouth daily.    . nitroGLYCERIN (NITROSTAT) 0.4 MG SL tablet Place 1 tablet (0.4 mg total) under the tongue every 5 (five) minutes x 3 doses as needed for chest pain. 25 tablet 12  . Omega-3 Fatty Acids (FISH OIL) 1000 MG CAPS Take 1,000 mg by mouth daily.    . polyethylene glycol (MIRALAX / GLYCOLAX) packet Take 17 g by mouth daily as needed for mild constipation.     [name] Sodium (STOOL SOFTENER & LAXATIVE PO) Take 2 tablets by mouth daily as needed (constipation).     . simvastatin (ZOCOR) 80 MG tablet Take 80 mg by mouth every evening.     . tamsulosin (FLOMAX) 0.4 MG CAPS capsule Take  0.4 mg by mouth daily. Pt is taking for a Kidney Infection- has about one week course left to take  0  . vitamin B-12 (CYANOCOBALAMIN) 500 MCG tablet Take 500 mcg by mouth daily.    . vitamin C (ASCORBIC ACID) 500 MG tablet Take 500 mg by mouth daily.    . vitamin E 100 UNIT capsule Take 400 Units by mouth daily.      No current facility-administered medications on file prior to visit.      PAST MEDICAL HISTORY:  Past Medical History:  Diagnosis Date  . Asymptomatic LV dysfunction    a. 2D ECHO on 12/04/2013. LV EF: 50% - 55%; G3DD, mild AR, mod MR, mild RA dilation, increased thickness of atrial septum, c/w lipomatous hypertrophy. Small patent foramen ovale. Present only with provocation from having the patient cough. Agitated saline contrast study showed a very small right-to-left atrial level shunt, in the baseline state.  Marland Kitchen CAD (coronary artery disease)    a. 01/16/14 s/p DES x2 to RCA. b remote RCA stenting 1997   . Chronic kidney disease   . Depression   . DM (diabetes mellitus) ([place])   . GERD (gastroesophageal reflux disease)   . HTN (hypertension)   . Hyperlipidemia   . Mitral regurgitation    a. mod by 11/2013 ECHO    PAST SURGICAL HISTORY:   Past Surgical History:  Procedure Laterality Date  . ANGIOPLASTY  1997  . ESOPHAGOGASTRODUODENOSCOPY ENDOSCOPY    . LEFT HEART CATHETERIZATION WITH CORONARY ANGIOGRAM N/A 01/15/2014   Procedure: LEFT HEART CATHETERIZATION WITH CORONARY ANGIOGRAM;  Surgeon: Lorretta Harp, MD;  Location: [facility] CATH LAB;  Service: Cardiovascular;  Laterality: N/A;  . PERCUTANEOUS CORONARY ROTOBLATOR INTERVENTION (PCI-R) N/A 01/16/2014   Procedure: PERCUTANEOUS CORONARY ROTOBLATOR INTERVENTION (PCI-R);  Surgeon: Troy Sine, MD;  Location: [facility] CATH LAB;  Service: Cardiovascular;  Laterality: N/A;    SOCIAL HISTORY:   Social History   Social History  . Marital status: Married    Spouse name: N/A  . Number of children: N/A  . Years of education: N/A    Occupational History  . retired Retired   Social History Main Topics  . Smoking status: Former Smoker    Packs/day: 1.50    Types: Cigarettes, Cigars    Quit date: 05/07/2013  . Smokeless tobacco: Never Used     Comment: quit cigarettes 1985 1.5 PPD, quit cigars 05/2013-- 5 cigars per day  . Alcohol use No  . Drug use: No  . Sexual activity: Not on file   Other Topics Concern  . Not on file   Social History Narrative  . No narrative on file    FAMILY HISTORY:   Family Status  Relation Status  . Mother Deceased   Heart Attack  . Father Deceased   Heart Problems  . Sister Alive   2, Lung Cancer  . Brother Deceased   Encephalitis  . Son Alive   3, Diabetes  . Daughter Alive  . Sister   . Sister     ROS:  A complete 10 system review of systems was obtained and was unremarkable apart from what is mentioned above.  PHYSICAL EXAMINATION:    VITALS:   Vitals:   08/20/16 1432  BP: 108/60  Pulse: 68  SpO2: 97%  Weight: 182 lb (82.6 kg)  Height: 5' 11.5" (1.816 m)    GEN:  The patient appears stated age.   HEENT:  [place]/AT.  MMM. CV:  RRR Lungs:  Clear but mild DOE (markedly improved compared to last visit)   Neurological examination:  Orientation: The patient is alert and oriented x3.  CN's:  There is good facial symmetry.  There is facial hypomimia.  Extra ocular muscles are intact.  The visual fields are full.  Speech is fluent and clear.  Soft palate rises symmetrically and there is no tongue deviation. Sensation: Sensation is intact to light touch throughout. Motor: Strength is 5/5 in the upper and lower extremity.  Movement examination: Tone: There is  mild increased tone in the UE bilaterally Abnormal movements: There is occasional tremor in the LUE Coordination:  There is decreased alternation of supination/pronation of the forearm bilaterally Gait and Station: The patient refuses to attempt to get up without using hands.   He is mildly unsteady, but  he is not short stepped or festinating today.  He has mild camptocormia to the right.  ASSESSMENT/PLAN:  1.  idiopathic Parkinson's disease.  -He is on carbidopa/levodopa 25/100, 2/2/1.  Will remain on that.  -Continue therapy as planned.  Encouraged home exercise as tolerated 2.  Falls  -He has had falls.  I would like to see him using a walker but he refuses 3.  shortness of breath  -This is better after cardiac stent but still with some DOE 4.  I will f/u with him in the next 4-5 months, sooner should new neurologic issues arise.  Much greater than 50% of this visit was spent in counseling and coordinating care.  Total face to face time:  20 min

## 2016-08-19 NOTE — Therapy (Signed)
[facility] [address] [place], [place], [address] Phone: [phone]   Fax:  [phone]  Occupational Therapy Treatment  Patient Details  Name: Ronald Benitez MRN: [id number] Date of Birth: Jan 30, 1929 Referring Provider: Dr. Wells Guiles Tat  Encounter Date: 08/19/2016      OT End of Session - 08/19/16 1031    Visit Number 3   Number of Visits 17   Date for OT Re-Evaluation 10/02/16   Authorization Type [employer], no visit limit, no auth; G code needed   Authorization Time Period anticipate 6-8 wks needed   Authorization - Visit Number 3   Authorization - Number of Visits 10   OT Start Time 1025   OT Stop Time 1103   OT Time Calculation (min) 38 min   Activity Tolerance Patient tolerated treatment well   Behavior During Therapy Flat affect;WFL for tasks assessed/performed      Past Medical History:  Diagnosis Date  . Asymptomatic LV dysfunction    a. 2D ECHO on 12/04/2013. LV EF: 50% - 55%; G3DD, mild AR, mod MR, mild RA dilation, increased thickness of atrial septum, c/w lipomatous hypertrophy. Small patent foramen ovale. Present only with provocation from having the patient cough. Agitated saline contrast study showed a very small right-to-left atrial level shunt, in the baseline state.  Marland Kitchen CAD (coronary artery disease)    a. 01/16/14 s/p DES x2 to RCA. b remote RCA stenting 1997   . Chronic kidney disease   . Depression   . DM (diabetes mellitus) ([place])   . GERD (gastroesophageal reflux disease)   . HTN (hypertension)   . Hyperlipidemia   . Mitral regurgitation    a. mod by 11/2013 ECHO    Past Surgical History:  Procedure Laterality Date  . ANGIOPLASTY  1997  . ESOPHAGOGASTRODUODENOSCOPY ENDOSCOPY    . LEFT HEART CATHETERIZATION WITH CORONARY ANGIOGRAM N/A 01/15/2014   Procedure: LEFT HEART CATHETERIZATION WITH CORONARY ANGIOGRAM;  Surgeon: Lorretta Harp, MD;  Location: [facility] CATH LAB;  Service:  Cardiovascular;  Laterality: N/A;  . PERCUTANEOUS CORONARY ROTOBLATOR INTERVENTION (PCI-R) N/A 01/16/2014   Procedure: PERCUTANEOUS CORONARY ROTOBLATOR INTERVENTION (PCI-R);  Surgeon: Troy Sine, MD;  Location: [facility] CATH LAB;  Service: Cardiovascular;  Laterality: N/A;    There were no vitals filed for this visit.      Subjective Assessment - 08/19/16 1029    Subjective  Pt reports that he fell and hurt R shoulder with difficulty raising it today.  Fell Monday in bathroom.   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 Yes   Pain Score 6    Pain Location --  shoulder   Pain Orientation Right   Pain Descriptors / Indicators Sharp   Pain Type Acute pain;Chronic pain   Pain Onset In the past 7 days   Aggravating Factors  raising arm   Pain Relieving Factors rest       Table slides for shoulder flex, horizontal abduction, and circles within pain-free range for stretch with min cues for trunk/head associated movements.      Modified PWR! Hands today for just opening hands with no elbow ext due to shoulder pain.  Recommended pt resume full PWR! Hands as shoulder pain resolves.  Also recommended pt perform table slides for stretch/stiffness until he is  able to resume PWR! Moves in supine.  Pt verbalized understanding.                    OT Education - 08/19/16 1042    Education Details coordination HEP--see pt instructions   Person(s) Educated Patient   Methods Explanation;Demonstration;Handout;Verbal cues   Comprehension Verbalized understanding;Returned demonstration;Verbal cues required;Need further instruction  min-mod          OT Short Term Goals - 08/03/16 1225      OT SHORT TERM GOAL #1   Title Pt will be independent with PD-specific HEP--check STGs 09/01/16    Time 4   Period Weeks   Status New     OT SHORT TERM GOAL #2   Title Pt will improve balance and functional reaching for ADLs as shown by improving standing functional reach by at least 2 inches with RUE.   Baseline 8"   Time 4   Period Weeks   Status New     OT SHORT TERM GOAL #3   Title Pt will improve abilty to dress as shown by improving time on PPT#4 by at least 5sec.   Baseline 61.97sec   Time 4   Period Weeks   Status New     OT SHORT TERM GOAL #4   Title Pt will be able to write numbers/simple phrases with 100% legibility and no significant decr in size in order to be able to continue to complete taxes.   Baseline 90%   Time 4   Period Weeks   Status New     OT SHORT TERM GOAL #5   Title Pt will verbalize understanding of ways to prevent future complications and PD-related community resources.   Time 4   Period Weeks   Status New           OT Long Term Goals - 08/03/16 1239      OT LONG TERM GOAL #1   Title Pt will verbalize understanding of adaptive strategies to incr ease/safety/independence with ADLs/IADL--check LTGs 10/02/16   Time 8   Period Weeks   Status New     OT LONG TERM GOAL #2   Title Pt will improve bilateral functional reaching/coordination as shown by improving score on box and blocks test by at least 4 bilaterally.   Baseline R-43 blocks, L-39 blocks   Time 8   Period Weeks   Status New     OT LONG TERM GOAL #3   Title Pt will improve abilty to dress as shown by improving time on PPT#4 by at least 10sec.   Baseline 61.97sec    Time 8   Period Weeks   Status New     OT LONG TERM GOAL #4   Title Pt will improve LUE functional reaching as shown by demo at least 110* shoulder flex and at least -25* elbow ext.   Baseline 105* shoulder flex, -25* elbow ext   Time 8   Period Weeks   Status New     OT LONG TERM GOAL #5   Title Pt will report bilateral shoulder pain less than or equal to 4/10 for ADLs.   Baseline 7/10   Time 8    Period Weeks   Status New               Plan - 08/19/16 1035    Clinical Impression Statement Pt reports fall with incr R shoulder pain today and unable to review PWR! movs today.  Pt responded  well to min-mod cueing for large amplitude movements for coordination tasks today.   Rehab Potential Fair   OT Frequency 2x / week   OT Duration 8 weeks  +evaluation (anticipate 6-8 weeks)   OT Treatment/Interventions Self-care/ADL training;Energy conservation;Manual Therapy;Cognitive remediation/compensation;Passive range of motion;Functional Mobility Training;Neuromuscular education;Cryotherapy;Therapeutic exercise;Ultrasound;Therapeutic activities;Therapeutic exercises;Balance training;Patient/family education;Splinting;DME and/or AE instruction;Moist Heat   Plan review PWR! up, rock, twist in supine; PWR! hands   OT Home Exercise Plan Education provided:  supine PWR! (up, rock, twist)   Consulted and Agree with Plan of Care Patient      Patient will benefit from skilled therapeutic intervention in order to improve the following deficits and impairments:  Decreased coordination, Decreased range of motion, Abnormal gait, Pain, Impaired UE functional use, Decreased knowledge of use of DME, Decreased balance, Decreased cognition, Decreased mobility, Decreased strength, Impaired perceived functional ability, Impaired tone, Decreased activity tolerance, Decreased endurance, Decreased safety awareness, Improper spinal/pelvic alignment, Decreased knowledge of precautions (bradykinesia)  Visit Diagnosis: Other symptoms and signs involving the nervous system  Other symptoms and signs involving the musculoskeletal system  Abnormal posture  Other abnormalities of gait and mobility  Unsteadiness on feet  Frontal lobe and executive function deficit  Other lack of coordination  Stiffness of left shoulder, not elsewhere classified  Stiffness of right shoulder, not elsewhere  classified  Chronic right shoulder pain  Chronic left shoulder pain    Problem List Patient Active Problem List   Diagnosis Date Noted  . COPD, mild ([place]) 04/09/2014  . Unstable angina ([place]) 01/17/2014  . Mitral regurgitation   . GERD (gastroesophageal reflux disease)   . DM (diabetes mellitus) ([place])   . Asymptomatic LV dysfunction   . CAD (coronary artery disease) 01/15/2014  . Abnormal nuclear cardiac imaging test 01/14/2014  . Chronic renal disease, stage III 01/14/2014  . Sinus bradycardia- HR 43 01/14/2014  . Essential hypertension 11/16/2013  . Hyperlipidemia 11/16/2013  . Depressive disorder, not elsewhere classified 05/08/2013    [facility] 08/19/2016, 10:43 AM  [facility] [address] [place], [place], [address] Phone: [phone]   Fax:  [phone]  Name: Ronald Benitez MRN: [id number] Date of Birth: September 12, 1928   [name], OTR/L [employer] [address] [place], [place]  [address] [phone] phone [phone] 08/19/16 10:43 AM

## 2016-08-19 NOTE — Patient Instructions (Addendum)
Coordination Exercises  Perform the following exercises for 20 minutes 1 times per day. Perform with both hand(s). Perform using big movements.   Flipping Cards: Place deck of cards on the table. Flip cards over by opening your hand big to grasp and then turn your palm up big, opening hand fully to release.  Deal cards: Hold 1/2 or whole deck in your hand. Use thumb to push card off top of deck with one big push.  Rotate ball with fingertips: Pick up with fingers/thumb and move as much as you can with each turn/movement (clockwise and counter-clockwise).  Pick up coins and place in coin bank or container: Pick up with big, intentional movements. Open hand before picking up and Do not drag coin to the edge.  Pick up coins and stack one at a time: Pick up with big, intentional movements. Do not drag coin to the edge. (5-10 in a stack)  Pick up 5-10 coins one at a time and hold in palm. Then, move coins from palm to fingertips one at time and place in coin bank/container.  Practice writing: Slow down, write big, and focus on forming each letter.  Fasten nuts/bolts or put on bottle caps: Turn as much/as big as you can with each turn.  Move wrist.  Perform "Flicks"/hand stretches (PWR! Hands): Close hands then flick out your fingers with focus on opening hands, pulling wrists back, and extending elbows like you are pushing.

## 2016-08-20 ENCOUNTER — Ambulatory Visit (INDEPENDENT_AMBULATORY_CARE_PROVIDER_SITE_OTHER): Payer: Medicare Other | Admitting: Neurology

## 2016-08-20 ENCOUNTER — Encounter: Payer: Self-pay | Admitting: Neurology

## 2016-08-20 VITALS — BP 108/60 | HR 68 | Ht 71.5 in | Wt 182.0 lb

## 2016-08-20 DIAGNOSIS — G20 Parkinson's disease: Secondary | ICD-10-CM

## 2016-08-24 ENCOUNTER — Ambulatory Visit: Payer: Medicare Other | Admitting: Speech Pathology

## 2016-08-24 ENCOUNTER — Ambulatory Visit: Payer: Medicare Other | Admitting: Physical Therapy

## 2016-08-24 ENCOUNTER — Ambulatory Visit: Payer: Medicare Other | Admitting: Occupational Therapy

## 2016-08-24 ENCOUNTER — Encounter: Payer: Self-pay | Admitting: Physical Therapy

## 2016-08-24 DIAGNOSIS — R29818 Other symptoms and signs involving the nervous system: Secondary | ICD-10-CM

## 2016-08-24 DIAGNOSIS — R41844 Frontal lobe and executive function deficit: Secondary | ICD-10-CM

## 2016-08-24 DIAGNOSIS — G8929 Other chronic pain: Secondary | ICD-10-CM

## 2016-08-24 DIAGNOSIS — R471 Dysarthria and anarthria: Secondary | ICD-10-CM

## 2016-08-24 DIAGNOSIS — R2689 Other abnormalities of gait and mobility: Secondary | ICD-10-CM

## 2016-08-24 DIAGNOSIS — M25611 Stiffness of right shoulder, not elsewhere classified: Secondary | ICD-10-CM

## 2016-08-24 DIAGNOSIS — M25612 Stiffness of left shoulder, not elsewhere classified: Secondary | ICD-10-CM

## 2016-08-24 DIAGNOSIS — R2681 Unsteadiness on feet: Secondary | ICD-10-CM

## 2016-08-24 DIAGNOSIS — M25512 Pain in left shoulder: Secondary | ICD-10-CM

## 2016-08-24 DIAGNOSIS — R293 Abnormal posture: Secondary | ICD-10-CM

## 2016-08-24 DIAGNOSIS — R29898 Other symptoms and signs involving the musculoskeletal system: Secondary | ICD-10-CM

## 2016-08-24 DIAGNOSIS — R278 Other lack of coordination: Secondary | ICD-10-CM

## 2016-08-24 DIAGNOSIS — M25511 Pain in right shoulder: Secondary | ICD-10-CM

## 2016-08-24 NOTE — Therapy (Signed)
[facility] [address] [place], [place], [address] Phone: [phone]   Fax:  [phone]  Speech Language Pathology Treatment  Patient Details  Name: Ronald Benitez MRN: [id number] Date of Birth: 10/21/1928 Referring Provider: Dr. Wells Guiles Tat  Encounter Date: 08/24/2016      End of Session - 08/24/16 1321    Visit Number 4   Number of Visits 12   Date for SLP Re-Evaluation 09/10/16   SLP Start Time 1232   SLP Stop Time  6962   SLP Time Calculation (min) 43 min      Past Medical History:  Diagnosis Date  . Asymptomatic LV dysfunction    a. 2D ECHO on 12/04/2013. LV EF: 50% - 55%; G3DD, mild AR, mod MR, mild RA dilation, increased thickness of atrial septum, c/w lipomatous hypertrophy. Small patent foramen ovale. Present only with provocation from having the patient cough. Agitated saline contrast study showed a very small right-to-left atrial level shunt, in the baseline state.  Marland Kitchen CAD (coronary artery disease)    a. 01/16/14 s/p DES x2 to RCA. b remote RCA stenting 1997   . Chronic kidney disease   . Depression   . DM (diabetes mellitus) ([place])   . GERD (gastroesophageal reflux disease)   . HTN (hypertension)   . Hyperlipidemia   . Mitral regurgitation    a. mod by 11/2013 ECHO    Past Surgical History:  Procedure Laterality Date  . ANGIOPLASTY  1997  . ESOPHAGOGASTRODUODENOSCOPY ENDOSCOPY    . LEFT HEART CATHETERIZATION WITH CORONARY ANGIOGRAM N/A 01/15/2014   Procedure: LEFT HEART CATHETERIZATION WITH CORONARY ANGIOGRAM;  Surgeon: Lorretta Harp, MD;  Location: [facility] CATH LAB;  Service: Cardiovascular;  Laterality: N/A;  . PERCUTANEOUS CORONARY ROTOBLATOR INTERVENTION (PCI-R) N/A 01/16/2014   Procedure: PERCUTANEOUS CORONARY ROTOBLATOR INTERVENTION (PCI-R);  Surgeon: Troy Sine, MD;  Location: [facility] CATH LAB;  Service: Cardiovascular;  Laterality: N/A;    There were no vitals filed for this visit.       Subjective Assessment - 08/24/16 1242    Subjective "I'm supposed to be opening my mouth big"               ADULT SLP TREATMENT - 08/24/16 1242      General Information   Behavior/Cognition Alert;Cooperative;Pleasant mood;Hard of hearing     Treatment Provided   Treatment provided Cognitive-Linquistic     Pain Assessment   Pain Assessment No/denies pain     Cognitive-Linquistic Treatment   Treatment focused on Dysarthria   Skilled Treatment Pt continues to maintain volume over 70dB with mod I. Reviewed abdominal breathing with rare min A - Over articultation with wtih rare min A in  simple structured tasks, and with mildly complex descrptive tasks with rare min A for over articulation. Simple conversation with use of compensations with rare min A.      Assessment / Recommendations / Plan   Plan Continue with current plan of care     Progression Toward Goals   Progression toward goals Progressing toward goals          SLP Education - 08/24/16 1311    Education provided Yes   Education Details Compensations for dysarthria   Person(s) Educated Patient   Methods Explanation;Demonstration;Verbal cues   Comprehension Verbalized understanding;Returned demonstration;Verbal cues required          SLP Short Term Goals - 08/24/16 1315      SLP SHORT TERM GOAL #1   Title  Pt will verbalize process of diaphragmatic breathing with mod cues for accuracy   Time 3   Period Weeks   Status Achieved     SLP SHORT TERM GOAL #2   Title Pt will demonstrate diaphragmatic breathing during 2 therapy sessions   Baseline 08-11-16, 08-12-16   Time 3   Period Weeks   Status Achieved     SLP SHORT TERM GOAL #3   Title Pt will identify and implement compensatory strategy for improved functional recall with mod cues for use outside of therapy   Time 3   Period Weeks   Status On-going          SLP Long Term Goals - 08/24/16 1315      SLP LONG TERM GOAL #1   Title Pt will  verbalize the process of diaphragmatic breathing with min cues for accuracy   Time 5   Period Weeks   Status On-going     SLP LONG TERM GOAL #2   Title Pt will demonstrate diaphragmatic breathing during functional tasks during therapy, and report practice at home   Time 5   Period Weeks   Status On-going     SLP LONG TERM GOAL #3   Title Pt will report use of compensatory strategy for improved functional recall between sessions   Time 5   Period Weeks   Status On-going          Plan - 08/24/16 1313    Clinical Impression Statement Pt presented today with WNL loudness, however with slightly reduced articulatory precision. His use of abdominal breathing was good (not excellent) in conversation. Suspect 2-3 more weeks prior to d/c. Skilled ST remains necessary to assist pt in carryover of clearer articulation as well as use of abdominal breathing across speaking situations.   Speech Therapy Frequency 2x / week   Treatment/Interventions Functional tasks;Patient/family education;SLP instruction and feedback   Potential to Achieve Goals Good   Potential Considerations Ability to learn/carryover information      Patient will benefit from skilled therapeutic intervention in order to improve the following deficits and impairments:   Dysarthria and anarthria    Problem List Patient Active Problem List   Diagnosis Date Noted  . COPD, mild ([place]) 04/09/2014  . Unstable angina ([place]) 01/17/2014  . Mitral regurgitation   . GERD (gastroesophageal reflux disease)   . DM (diabetes mellitus) ([place])   . Asymptomatic LV dysfunction   . CAD (coronary artery disease) 01/15/2014  . Abnormal nuclear cardiac imaging test 01/14/2014  . Chronic renal disease, stage III 01/14/2014  . Sinus bradycardia- HR 43 01/14/2014  . Essential hypertension 11/16/2013  . Hyperlipidemia 11/16/2013  . Depressive disorder, not elsewhere classified 05/08/2013    Jaeleigh Monaco, Annye Rusk MS, CCC-SLP 08/24/2016, 1:22  PM  [facility] [address] [place], [place], [address] Phone: [phone]   Fax:  [phone]   Name: Ronald Benitez MRN: [id number] Date of Birth: 06/22/1928

## 2016-08-24 NOTE — Therapy (Signed)
[facility] [address] [place], [place], [address] Phone: [phone]   Fax:  [phone]  Occupational Therapy Treatment  Patient Details  Name: Ronald Benitez MRN: [id number] Date of Birth: [birth year] Referring Provider: Dr. Wells Guiles Tat  Encounter Date: 08/24/2016      OT End of Session - 08/24/16 1215    Visit Number 4   Number of Visits 17   Date for OT Re-Evaluation 10/02/16   Authorization Type Blue Medicare, no visit limit, no auth; G code needed   Authorization Time Period anticipate 6-8 wks needed   Authorization - Visit Number 4   Authorization - Number of Visits 10   OT Start Time 1150   OT Stop Time 1230   OT Time Calculation (min) 40 min   Activity Tolerance Patient tolerated treatment well   Behavior During Therapy Flat affect;WFL for tasks assessed/performed      Past Medical History:  Diagnosis Date  . Asymptomatic LV dysfunction    a. 2D ECHO on 12/04/2013. LV EF: 50% - 55%; G3DD, mild AR, mod MR, mild RA dilation, increased thickness of atrial septum, c/w lipomatous hypertrophy. Small patent foramen ovale. Present only with provocation from having the patient cough. Agitated saline contrast study showed a very small right-to-left atrial level shunt, in the baseline state.  Marland Kitchen CAD (coronary artery disease)    a. 01/16/14 s/p DES x2 to RCA. b remote RCA stenting 1997   . Chronic kidney disease   . Depression   . DM (diabetes mellitus) ([place])   . GERD (gastroesophageal reflux disease)   . HTN (hypertension)   . Hyperlipidemia   . Mitral regurgitation    a. mod by 11/2013 ECHO    Past Surgical History:  Procedure Laterality Date  . ANGIOPLASTY  1997  . ESOPHAGOGASTRODUODENOSCOPY ENDOSCOPY    . LEFT HEART CATHETERIZATION WITH CORONARY ANGIOGRAM N/A 01/15/2014   Procedure: LEFT HEART CATHETERIZATION WITH CORONARY ANGIOGRAM;  Surgeon: Lorretta Harp, MD;  Location: [facility] CATH LAB;  Service:  Cardiovascular;  Laterality: N/A;  . PERCUTANEOUS CORONARY ROTOBLATOR INTERVENTION (PCI-R) N/A 01/16/2014   Procedure: PERCUTANEOUS CORONARY ROTOBLATOR INTERVENTION (PCI-R);  Surgeon: Troy Sine, MD;  Location: [facility] CATH LAB;  Service: Cardiovascular;  Laterality: N/A;    There were no vitals filed for this visit.      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 Yes   Pain Score 6    Pain Location --  shoulders, back   Pain Orientation Right;Left   Pain Descriptors / Indicators Sharp   Pain Type Acute pain;Chronic pain   Pain Onset In the past 7 days   Aggravating Factors  movement   Pain Relieving Factors rest          In supine, PWR! Up with min-mod cueing for performance (pt unable to count while doing ex due to cognitive deficits).  Attempted rock and twist; however, unable to perform due to back and shoulder pain (appears to be incr after fall last week, although pt demo incr R shoulder ROM today than last wk).    In supine, chest press and shoulder flex AAROM with ball with  min cues within pain-free range.  Reveiwed coordination HEP. Pt returned demo each with min cueing for incr movement amplitude.    Reviewed shoulder flex table slides with BUEs with min v.c.  PWR! Hands x15 with min cueing for incr movement amplitude.                          OT Short Term Goals - 08/03/16 1225      OT SHORT TERM GOAL #1   Title Pt will be independent with PD-specific HEP--check STGs 09/01/16   Time 4   Period Weeks   Status New     OT SHORT TERM GOAL #2   Title Pt will improve balance and functional reaching for ADLs as shown by improving  standing functional reach by at least 2 inches with RUE.   Baseline 8"   Time 4   Period Weeks   Status New     OT SHORT TERM GOAL #3   Title Pt will improve abilty to dress as shown by improving time on PPT#4 by at least 5sec.   Baseline 61.97sec   Time 4   Period Weeks   Status New     OT SHORT TERM GOAL #4   Title Pt will be able to write numbers/simple phrases with 100% legibility and no significant decr in size in order to be able to continue to complete taxes.   Baseline 90%   Time 4   Period Weeks   Status New     OT SHORT TERM GOAL #5   Title Pt will verbalize understanding of ways to prevent future complications and PD-related community resources.   Time 4   Period Weeks   Status New           OT Long Term Goals - 08/03/16 1239      OT LONG TERM GOAL #1   Title Pt will verbalize understanding of adaptive strategies to incr ease/safety/independence with ADLs/IADL--check LTGs 10/02/16   Time 8   Period Weeks   Status New     OT LONG TERM GOAL #2   Title Pt will improve bilateral functional reaching/coordination as shown by improving score on box and blocks test by at least 4 bilaterally.   Baseline R-43 blocks, L-39 blocks   Time 8   Period Weeks   Status New     OT LONG TERM GOAL #3   Title Pt will improve abilty to dress as shown by improving time on PPT#4 by at least 10sec.   Baseline 61.97sec    Time 8   Period Weeks   Status New     OT LONG TERM GOAL #4   Title Pt will improve LUE functional reaching as shown by demo at least 110* shoulder flex and at least -25* elbow ext.   Baseline 105* shoulder flex, -25* elbow ext   Time 8   Period Weeks   Status New     OT LONG TERM GOAL #5   Title Pt will report bilateral shoulder pain less than or equal to 4/10 for ADLs.   Baseline 7/10   Time 8   Period Weeks   Status New               Plan - 08/24/16 1210    Clinical Impression Statement Pt demo some improvement in R shoulder ROM today  than last sesion, but is not at baseline (and was unable to tolerate PWR! rock, twist  today in supine despite modifications due to pain).  Recommended pt continue with PWR! up in supine and shoulder flex table slides.  Pt also reports incr back pain after fall impacting progress.   Rehab Potential Fair   OT Frequency 2x / week   OT Duration 8 weeks  +evaluation (anticipate 6-8 weeks)   OT Treatment/Interventions Self-care/ADL training;Energy conservation;Manual Therapy;Cognitive remediation/compensation;Passive range of motion;Functional Mobility Training;Neuromuscular education;Cryotherapy;Therapeutic exercise;Ultrasound;Therapeutic activities;Therapeutic exercises;Balance training;Patient/family education;Splinting;DME and/or AE instruction;Moist Heat   Plan shoulder flex in supine with ball; coordination; ADLs with large amplitude movements   OT Home Exercise Plan Education provided:  supine PWR! (up, rock, twist)   Consulted and Agree with Plan of Care Patient      Patient will benefit from skilled therapeutic intervention in order to improve the following deficits and impairments:  Decreased coordination, Decreased range of motion, Abnormal gait, Pain, Impaired UE functional use, Decreased knowledge of use of DME, Decreased balance, Decreased cognition, Decreased mobility, Decreased strength, Impaired perceived functional ability, Impaired tone, Decreased activity tolerance, Decreased endurance, Decreased safety awareness, Improper spinal/pelvic alignment, Decreased knowledge of precautions (bradykinesia)  Visit Diagnosis: Other symptoms and signs involving the nervous system  Other symptoms and signs involving the musculoskeletal system  Abnormal posture  Other abnormalities of gait and mobility  Unsteadiness on feet  Frontal lobe and executive function deficit  Other lack of coordination  Stiffness of left shoulder, not elsewhere classified  Stiffness of right shoulder, not  elsewhere classified  Chronic right shoulder pain  Chronic left shoulder pain    Problem List Patient Active Problem List   Diagnosis Date Noted  . COPD, mild ([place]) 04/09/2014  . Unstable angina ([place]) 01/17/2014  . Mitral regurgitation   . GERD (gastroesophageal reflux disease)   . DM (diabetes mellitus) ([place])   . Asymptomatic LV dysfunction   . CAD (coronary artery disease) 01/15/2014  . Abnormal nuclear cardiac imaging test 01/14/2014  . Chronic renal disease, stage III 01/14/2014  . Sinus bradycardia- HR 43 01/14/2014  . Essential hypertension 11/16/2013  . Hyperlipidemia 11/16/2013  . Depressive disorder, not elsewhere classified 05/08/2013    [facility] 08/24/2016, 12:18 PM  [facility] [address] [place], [place], [address] Phone: [phone]   Fax:  [phone]  Name: MYRICK MCNAIRY MRN: [id number] Date of Birth: 1928-11-23   [name], OTR/L [facility] [address] [place], [place]  [address] [phone] phone [phone] 08/24/16 12:18 PM

## 2016-08-24 NOTE — Therapy (Signed)
[facility] [address] [place], [place], [address] Phone: [phone]   Fax:  [phone]  Physical Therapy Treatment  Patient Details  Name: Ronald Benitez MRN: [id number] Date of Birth: 02-03-1929 Referring Provider: Alonza Bogus  Encounter Date: 08/24/2016      PT End of Session - 08/24/16 2145    Visit Number 5   Number of Visits 17   Date for PT Re-Evaluation 10/02/16   Authorization Type Blue Medicare [id number]-GCODE every 10th visit   PT Start Time 1315   PT Stop Time 1400   PT Time Calculation (min) 45 min   Activity Tolerance Patient tolerated treatment well   Behavior During Therapy Flat affect;WFL for tasks assessed/performed      Past Medical History:  Diagnosis Date  . Asymptomatic LV dysfunction    a. 2D ECHO on 12/04/2013. LV EF: 50% - 55%; G3DD, mild AR, mod MR, mild RA dilation, increased thickness of atrial septum, c/w lipomatous hypertrophy. Small patent foramen ovale. Present only with provocation from having the patient cough. Agitated saline contrast study showed a very small right-to-left atrial level shunt, in the baseline state.  Marland Kitchen CAD (coronary artery disease)    a. 01/16/14 s/p DES x2 to RCA. b remote RCA stenting 1997   . Chronic kidney disease   . Depression   . DM (diabetes mellitus) ([place])   . GERD (gastroesophageal reflux disease)   . HTN (hypertension)   . Hyperlipidemia   . Mitral regurgitation    a. mod by 11/2013 ECHO    Past Surgical History:  Procedure Laterality Date  . ANGIOPLASTY  1997  . ESOPHAGOGASTRODUODENOSCOPY ENDOSCOPY    . LEFT HEART CATHETERIZATION WITH CORONARY ANGIOGRAM N/A 01/15/2014   Procedure: LEFT HEART CATHETERIZATION WITH CORONARY ANGIOGRAM;  Surgeon: Lorretta Harp, MD;  Location: [facility] CATH LAB;  Service: Cardiovascular;  Laterality: N/A;  . PERCUTANEOUS CORONARY ROTOBLATOR INTERVENTION (PCI-R) N/A 01/16/2014   Procedure: PERCUTANEOUS CORONARY  ROTOBLATOR INTERVENTION (PCI-R);  Surgeon: Troy Sine, MD;  Location: [facility] CATH LAB;  Service: Cardiovascular;  Laterality: N/A;    There were no vitals filed for this visit.      Subjective Assessment - 08/24/16 1318    Subjective Reports he just lost his balance when he last fell in the bathroom. Seems confused re: when he last fell (per chart recently; per patient "months ago.")   Currently in Pain? Yes   Pain Score 6    Pain Location Shoulder   Pain Orientation Right;Left   Pain Descriptors / Indicators Sharp   Pain Type Acute pain;Chronic pain   Pain Onset In the past 7 days                         [facility] Adult PT Treatment/Exercise - 08/24/16 1323      Transfers   Transfers Sit to Stand;Stand to Sit   Sit to Stand 5: Supervision;With upper extremity assist   Sit to Stand Details (indicate cue type and reason) reporting soreness and using UE support    Stand to Sit 5: Supervision;Without upper extremity assist;To bed   Comments vc for out to edge of seat; feet underneath; upright posture upon standing; controlled descent     Ambulation/Gait   Ambulation/Gait Yes   Ambulation/Gait Assistance 5: Supervision   Ambulation/Gait Assistance Details vc and tactile cues at shoulders for incr arm swing; slowed down steps to work on arm swing however pt unable to  carryover for more than 30 ft when speed increased to his normal   Ambulation Distance (Feet) 500 Feet   Assistive device None   Gait Pattern Step-through pattern;Decreased arm swing - left;Decreased step length - right;Decreased step length - left;Lateral trunk lean to right;Trunk flexed;Narrow base of support;Poor foot clearance - left;Poor foot clearance - right   Ambulation Surface Level;Indoor     Posture/Postural Control   Posture/Postural Control Postural limitations   Postural Limitations Rounded Shoulders;Forward head;Posterior pelvic tilt;Weight shift right   Posture Comments worked on posture  during [employer] exercises     Exercises   Exercises Ankle;Lumbar     Lumbar Exercises: Stretches   Single Knee to Chest Stretch 1 rep;30 seconds   Lower Trunk Rotation 2 reps;30 seconds  2 each side; AAROM for improved lateral trunk stretch   Active Hamstring Stretch --     Knee/Hip Exercises: Aerobic   Nustep --   Stepper SciFit seated stepper; level 1.4x 5 minutes; pt able to recall to keep RPM >60-70     Ankle Exercises: Stretches   Soleus Stretch 2 reps;30 seconds   Soleus Stretch Limitations runner's stretch at wall; attempted to get hip flexor stretch at sme time, however pt could not coordinate getting his heel down, knee straight and hips forward             Balance Exercises - 08/24/16 1408      OTAGO PROGRAM   Neck Movements Standing;5 reps  back to the wall "reach the back of your neck to the wall"   Trunk Movements Standing  5 each direction   Ankle Movements Sitting;10 reps   Hip ABductor 10 reps  bil UE on counter   Ankle Dorsiflexors --  standing 10 reps   Overall OTAGO Comments asked pt to return demonstrate HEP using handouts; pt required min vc's for technique             PT Short Term Goals - 08/03/16 1412      PT SHORT TERM GOAL #1   Title Pt will perform HEP with family supervision, for improved functional mobility, gait and transfers.  TARGET 09/01/16   Time 4   Period Weeks   Status New     PT SHORT TERM GOAL #2   Title Pt will improve 5x sit<>stand to less than or equal to 20 seconds for improved transfer efficiency and safety.   Time 4   Period Weeks   Status New     PT SHORT TERM GOAL #3   Title Pt will improve TUG cog score to less than or equal to 15 seconds for decreased fall risk.   Time 4   Period Weeks   Status New     PT SHORT TERM GOAL #4   Title Pt will improve Functional Gait Assessment to at least 20/30 for decreased fall risk.   Time 4   Period Weeks   Status New           PT Long Term Goals - 08/03/16  1416      PT LONG TERM GOAL #1   Title Pt will verbalize understanding of fall prevention in the home environment.  TARGET 10/02/16   Time 8   Period Weeks   Status New     PT LONG TERM GOAL #2   Title Pt will improve 5x sit<>stand to less than or equal to 17 seconds for improved transfer efficiency and safety.   Time 8   Status New  PT LONG TERM GOAL #3   Title Pt will improve TUG manual score to less than or equal to 14.5 seconds for decreased fall risk.   Time 8   Period Weeks   Status New     PT LONG TERM GOAL #4   Title Pt will improve FGA score to at least 23/30 for decreased fall risk, improved gait efficiency and safety.   Time 8   Period Weeks   Status New     PT LONG TERM GOAL #5   Title Pt will verbalize plans for continued community fitness upon D/C from PT.   Time 8   Period Weeks   Status New               Plan - 08/24/16 2146    Clinical Impression Statement Patient with some confusion this date (re: falls, when, where, injuries). Required min cues for [facility] balance exercises. Session focused on balance, strengthening, and gait. Slowly progressing towards goals.   Rehab Potential Good   PT Frequency 2x / week   PT Duration 8 weeks   PT Treatment/Interventions ADLs/Self Care Home Management;Functional mobility training;Gait training;Therapeutic activities;Therapeutic exercise;Balance training;Neuromuscular re-education;Patient/family education   PT Next Visit Plan Review OTAGO HEP and add additional exercises as appropriate.  Add walking program for home-add to HEP; continue to work on gait intensity and transfer training   Consulted and Agree with Plan of Care Patient      Patient will benefit from skilled therapeutic intervention in order to improve the following deficits and impairments:  Abnormal gait, Decreased balance, Decreased mobility, Decreased strength, Difficulty walking, Postural dysfunction  Visit Diagnosis: Other symptoms and signs  involving the nervous system  Abnormal posture  Other abnormalities of gait and mobility  Unsteadiness on feet     Problem List Patient Active Problem List   Diagnosis Date Noted  . COPD, mild ([place]) 04/09/2014  . Unstable angina ([place]) 01/17/2014  . Mitral regurgitation   . GERD (gastroesophageal reflux disease)   . DM (diabetes mellitus) ([place])   . Asymptomatic LV dysfunction   . CAD (coronary artery disease) 01/15/2014  . Abnormal nuclear cardiac imaging test 01/14/2014  . Chronic renal disease, stage III 01/14/2014  . Sinus bradycardia- HR 43 01/14/2014  . Essential hypertension 11/16/2013  . Hyperlipidemia 11/16/2013  . Depressive disorder, not elsewhere classified 05/08/2013    Rexanne Mano, PT 08/24/2016, 9:50 PM  [facility] [address] [place], [place], [address] Phone: [phone]   Fax:  [phone]  Name: ISAUL LANDI MRN: [id number] Date of Birth: 1928/10/30

## 2016-08-25 ENCOUNTER — Ambulatory Visit: Payer: Medicare Other | Admitting: Occupational Therapy

## 2016-08-25 ENCOUNTER — Ambulatory Visit: Payer: Medicare Other

## 2016-08-25 ENCOUNTER — Ambulatory Visit: Payer: Medicare Other | Admitting: Physical Therapy

## 2016-09-01 ENCOUNTER — Ambulatory Visit: Payer: Medicare Other | Admitting: Physical Therapy

## 2016-09-01 ENCOUNTER — Encounter: Payer: Self-pay | Admitting: Physical Therapy

## 2016-09-01 ENCOUNTER — Ambulatory Visit: Payer: Medicare Other | Admitting: Occupational Therapy

## 2016-09-01 DIAGNOSIS — R293 Abnormal posture: Secondary | ICD-10-CM

## 2016-09-01 DIAGNOSIS — R41844 Frontal lobe and executive function deficit: Secondary | ICD-10-CM

## 2016-09-01 DIAGNOSIS — R29818 Other symptoms and signs involving the nervous system: Secondary | ICD-10-CM | POA: Diagnosis not present

## 2016-09-01 DIAGNOSIS — R278 Other lack of coordination: Secondary | ICD-10-CM

## 2016-09-01 DIAGNOSIS — M25612 Stiffness of left shoulder, not elsewhere classified: Secondary | ICD-10-CM

## 2016-09-01 DIAGNOSIS — M25611 Stiffness of right shoulder, not elsewhere classified: Secondary | ICD-10-CM

## 2016-09-01 DIAGNOSIS — R2681 Unsteadiness on feet: Secondary | ICD-10-CM

## 2016-09-01 DIAGNOSIS — R29898 Other symptoms and signs involving the musculoskeletal system: Secondary | ICD-10-CM

## 2016-09-01 DIAGNOSIS — R2689 Other abnormalities of gait and mobility: Secondary | ICD-10-CM

## 2016-09-01 NOTE — Therapy (Signed)
[facility] [address] [place], [place], [address] Phone: [phone]   Fax:  [phone]  Occupational Therapy Treatment  Patient Details  Name: Ronald Benitez MRN: [id number] Date of Birth: 04/08/1929 Referring Provider: Dr. Wells Guiles Tat  Encounter Date: 09/01/2016      OT End of Session - 09/01/16 1358    Visit Number 5   Number of Visits 17   Date for OT Re-Evaluation 10/02/16   Authorization Type Blue [employer], no visit limit, no auth; G code needed   Authorization Time Period anticipate 6-8 wks needed   Authorization - Visit Number 5   Authorization - Number of Visits 10   OT Start Time 1318   OT Stop Time 1400   OT Time Calculation (min) 42 min   Activity Tolerance Patient tolerated treatment well   Behavior During Therapy [facility] for tasks assessed/performed      Past Medical History:  Diagnosis Date  . Asymptomatic LV dysfunction    a. 2D ECHO on 12/04/2013. LV EF: 50% - 55%; G3DD, mild AR, mod MR, mild RA dilation, increased thickness of atrial septum, c/w lipomatous hypertrophy. Small patent foramen ovale. Present only with provocation from having the patient cough. Agitated saline contrast study showed a very small right-to-left atrial level shunt, in the baseline state.  Marland Kitchen CAD (coronary artery disease)    a. 01/16/14 s/p DES x2 to RCA. b remote RCA stenting 1997   . Chronic kidney disease   . Depression   . DM (diabetes mellitus) ([place])   . GERD (gastroesophageal reflux disease)   . HTN (hypertension)   . Hyperlipidemia   . Mitral regurgitation    a. mod by 11/2013 ECHO    Past Surgical History:  Procedure Laterality Date  . ANGIOPLASTY  1997  . ESOPHAGOGASTRODUODENOSCOPY ENDOSCOPY    . LEFT HEART CATHETERIZATION WITH CORONARY ANGIOGRAM N/A 01/15/2014   Procedure: LEFT HEART CATHETERIZATION WITH CORONARY ANGIOGRAM;  Surgeon: Lorretta Harp, MD;  Location: [facility] CATH LAB;  Service: Cardiovascular;   Laterality: N/A;  . PERCUTANEOUS CORONARY ROTOBLATOR INTERVENTION (PCI-R) N/A 01/16/2014   Procedure: PERCUTANEOUS CORONARY ROTOBLATOR INTERVENTION (PCI-R);  Surgeon: Troy Sine, MD;  Location: [facility] CATH LAB;  Service: Cardiovascular;  Laterality: N/A;    There were no vitals filed for this visit.      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 Yes   Pain Score 5    Pain Location Shoulder   Pain Orientation Right;Left   Pain Descriptors / Indicators Sharp   Pain Type Acute pain   Pain Onset In the past 7 days   Pain Frequency Intermittent   Aggravating Factors  movements   Pain Relieving Factors rest         Supine PWR! Up x 10 reps, then closed chain shoulder flexion and chest press with ball 10 reps each within tolerated ROM, min/mod v.c. Simulated ADLS  For the following: donning pants, pulling up socks, tucking in shirt,  And donning shirt overhead, min v.c. Reviewed activities from coordination HEP: flipping and dealing cards, rotating/ tossing ball, picking up stacking coins and manipulating to place in container, min-mod v.c.  OT Short Term Goals - 08/03/16 1225      OT SHORT TERM GOAL #1   Title Pt will be independent with PD-specific HEP--check STGs 09/01/16   Time 4   Period Weeks   Status New     OT SHORT TERM GOAL #2   Title Pt will improve balance and functional reaching for ADLs as shown by improving standing functional reach by at least 2 inches with RUE.   Baseline 8"   Time 4   Period Weeks   Status New     OT SHORT TERM GOAL #3   Title Pt will improve abilty to dress as shown by improving time on PPT#4 by at least 5sec.   Baseline 61.97sec   Time 4   Period Weeks   Status  New     OT SHORT TERM GOAL #4   Title Pt will be able to write numbers/simple phrases with 100% legibility and no significant decr in size in order to be able to continue to complete taxes.   Baseline 90%   Time 4   Period Weeks   Status New     OT SHORT TERM GOAL #5   Title Pt will verbalize understanding of ways to prevent future complications and PD-related community resources.   Time 4   Period Weeks   Status New           OT Long Term Goals - 08/03/16 1239      OT LONG TERM GOAL #1   Title Pt will verbalize understanding of adaptive strategies to incr ease/safety/independence with ADLs/IADL--check LTGs 10/02/16   Time 8   Period Weeks   Status New     OT LONG TERM GOAL #2   Title Pt will improve bilateral functional reaching/coordination as shown by improving score on box and blocks test by at least 4 bilaterally.   Baseline R-43 blocks, L-39 blocks   Time 8   Period Weeks   Status New     OT LONG TERM GOAL #3   Title Pt will improve abilty to dress as shown by improving time on PPT#4 by at least 10sec.   Baseline 61.97sec    Time 8   Period Weeks   Status New     OT LONG TERM GOAL #4   Title Pt will improve LUE functional reaching as shown by demo at least 110* shoulder flex and at least -25* elbow ext.   Baseline 105* shoulder flex, -25* elbow ext   Time 8   Period Weeks   Status New     OT LONG TERM GOAL #5   Title Pt will report bilateral shoulder pain less than or equal to 4/10 for ADLs.   Baseline 7/10   Time 8   Period Weeks   Status New               Plan - 09/01/16 1335    Clinical Impression Statement Pt is progressing towards goals. Pt requires v.c for larger amplitude movements with coordination activities.   Rehab Potential Fair   OT Frequency 2x / week   OT Duration 8 weeks   OT Treatment/Interventions Self-care/ADL training;Energy conservation;Manual Therapy;Cognitive remediation/compensation;Passive range of motion;Functional  Mobility Training;Neuromuscular education;Cryotherapy;Therapeutic exercise;Ultrasound;Therapeutic activities;Therapeutic exercises;Balance training;Patient/family education;Splinting;DME and/or AE instruction;Moist Heat   Plan review ball ex in supine and issue as HEP if no significant pain, ADLs with large amplitude movments   OT Home Exercise Plan Education provided:  supine PWR! (up, rock, twist)  Consulted and Agree with Plan of Care Patient      Patient will benefit from skilled therapeutic intervention in order to improve the following deficits and impairments:  Decreased coordination, Decreased range of motion, Abnormal gait, Pain, Impaired UE functional use, Decreased knowledge of use of DME, Decreased balance, Decreased cognition, Decreased mobility, Decreased strength, Impaired perceived functional ability, Impaired tone, Decreased activity tolerance, Decreased endurance, Decreased safety awareness, Improper spinal/pelvic alignment, Decreased knowledge of precautions  Visit Diagnosis: No diagnosis found.    Problem List Patient Active Problem List   Diagnosis Date Noted  . COPD, mild ([place]) 04/09/2014  . Unstable angina ([place]) 01/17/2014  . Mitral regurgitation   . GERD (gastroesophageal reflux disease)   . DM (diabetes mellitus) ([place])   . Asymptomatic LV dysfunction   . CAD (coronary artery disease) 01/15/2014  . Abnormal nuclear cardiac imaging test 01/14/2014  . Chronic renal disease, stage III 01/14/2014  . Sinus bradycardia- HR 43 01/14/2014  . Essential hypertension 11/16/2013  . Hyperlipidemia 11/16/2013  . Depressive disorder, not elsewhere classified 05/08/2013    RINE,KATHRYN 09/01/2016, 1:59 PM  [facility] [address] [place], [place], [address] Phone: [phone]   Fax:  [phone]  Name: Ronald Benitez MRN: [id number] Date of Birth: 1929/04/17

## 2016-09-01 NOTE — Therapy (Signed)
[facility] [address] [place], [place], [address] Phone: [phone]   Fax:  [phone]  Physical Therapy Treatment  Patient Details  Name: Ronald Benitez MRN: [id number] Date of Birth: [birth year] Referring Provider: Alonza Bogus  Encounter Date: 09/01/2016      PT End of Session - 09/01/16 1448    Visit Number 6   Number of Visits 17   Date for PT Re-Evaluation 10/02/16   Authorization Type Blue Medicare HMO-GCODE every 10th visit   PT Start Time 1405   PT Stop Time 1445   PT Time Calculation (min) 40 min      Past Medical History:  Diagnosis Date  . Asymptomatic LV dysfunction    a. 2D ECHO on 12/04/2013. LV EF: 50% - 55%; G3DD, mild AR, mod MR, mild RA dilation, increased thickness of atrial septum, c/w lipomatous hypertrophy. Small patent foramen ovale. Present only with provocation from having the patient cough. Agitated saline contrast study showed a very small right-to-left atrial level shunt, in the baseline state.  Marland Kitchen CAD (coronary artery disease)    a. 01/16/14 s/p DES x2 to RCA. b remote RCA stenting 1997   . Chronic kidney disease   . Depression   . DM (diabetes mellitus) ([place])   . GERD (gastroesophageal reflux disease)   . HTN (hypertension)   . Hyperlipidemia   . Mitral regurgitation    a. mod by 11/2013 ECHO    Past Surgical History:  Procedure Laterality Date  . ANGIOPLASTY  1997  . ESOPHAGOGASTRODUODENOSCOPY ENDOSCOPY    . LEFT HEART CATHETERIZATION WITH CORONARY ANGIOGRAM N/A 01/15/2014   Procedure: LEFT HEART CATHETERIZATION WITH CORONARY ANGIOGRAM;  Surgeon: Lorretta Harp, MD;  Location: [facility] CATH LAB;  Service: Cardiovascular;  Laterality: N/A;  . PERCUTANEOUS CORONARY ROTOBLATOR INTERVENTION (PCI-R) N/A 01/16/2014   Procedure: PERCUTANEOUS CORONARY ROTOBLATOR INTERVENTION (PCI-R);  Surgeon: Troy Sine, MD;  Location: [facility] CATH LAB;  Service: Cardiovascular;  Laterality: N/A;     There were no vitals filed for this visit.      Subjective Assessment - 09/01/16 1407    Subjective Pt reports no falls since last week.   Currently in Pain? No/denies   Pain Onset In the past 7 days            [facility] PT Assessment - 09/01/16 0001      Timed Up and Go Test   TUG Normal TUG   Normal TUG (seconds) 11.1   TUG Comments Scores >13.5-15 seconds indicates increased fall risk.     Functional Gait  Assessment   Gait assessed  Yes   Gait Level Surface Walks 20 ft in less than 7 sec but greater than 5.5 sec, uses assistive device, slower speed, mild gait deviations, or deviates 6-10 in outside of the 12 in walkway width.   Change in Gait Speed Able to smoothly change walking speed without loss of balance or gait deviation. Deviate no more than 6 in outside of the 12 in walkway width.   Gait with Horizontal Head Turns Performs head turns smoothly with slight change in gait velocity (eg, minor disruption to smooth gait path), deviates 6-10 in outside 12 in walkway width, or uses an assistive device.   Gait with Vertical Head Turns Performs task with moderate change in gait velocity, slows down, deviates 10-15 in outside 12 in walkway width but recovers, can continue to walk.   Gait and Pivot Turn Pivot turns safely within 3 sec  and stops quickly with no loss of balance.   Step Over Obstacle Is able to step over 2 stacked shoe boxes taped together (9 in total height) without changing gait speed. No evidence of imbalance.   Gait with Narrow Base of Support Ambulates 4-7 steps.   Gait with Eyes Closed Walks 20 ft, slow speed, abnormal gait pattern, evidence for imbalance, deviates 10-15 in outside 12 in walkway width. Requires more than 9 sec to ambulate 20 ft.   Ambulating Backwards Walks 20 ft, uses assistive device, slower speed, mild gait deviations, deviates 6-10 in outside 12 in walkway width.   Steps Alternating feet, must use rail.   Total Score 20   FGA comment:  Scores <22/30 indicate increased fall risk (previous FGA score was 21/30 06/17/14 PT )                     [facility] Adult PT Treatment/Exercise - 09/01/16 0001      Transfers   Five time sit to stand comments  18 sec without UE support   Stand to Sit 5: Supervision;Without upper extremity assist             Balance Exercises - 09/01/16 1558      Balance Exercises: Standing   Standing, One Foot on a Step Eyes open;6 inch  Alt. toe taps , without UE support progressing with head turn, required intermittent UE support.           PT Education - 09/01/16 1410    Education provided Yes   Education Details Discussed HEP: pt reports going in the kitchen and doing them himself at the counter. Discussed scheduling- pt is scheduled for 2 more weeks and need to decide if he would like to schedule out to the full 4 weeks.          PT Short Term Goals - 09/01/16 1559      PT SHORT TERM GOAL #1   Title Pt will perform HEP with family supervision, for improved functional mobility, gait and transfers.  TARGET 09/01/16   Baseline Pt reports performing consistently on his own and has no questions about HEP; 09/01/16   Time 4   Period Weeks   Status Achieved     PT SHORT TERM GOAL #2   Title Pt will improve 5x sit<>stand to less than or equal to 20 seconds for improved transfer efficiency and safety.   Baseline 5X sit<>stands 18 sec without UE support; 09/01/16.   Time 4   Period Weeks   Status Achieved     PT SHORT TERM GOAL #3   Title Pt will improve TUG cog score to less than or equal to 15 seconds for decreased fall risk.   Time 4   Period Weeks   Status New     PT SHORT TERM GOAL #4   Title Pt will improve Functional Gait Assessment to at least 20/30 for decreased fall risk.   Baseline FGA 20/30; 09/01/16.   Time 4   Period Weeks   Status Achieved           PT Long Term Goals - 08/03/16 1416      PT LONG TERM GOAL #1   Title Pt will verbalize  understanding of fall prevention in the home environment.  TARGET 10/02/16   Time 8   Period Weeks   Status New     PT LONG TERM GOAL #2   Title Pt will improve 5x sit<>stand to  less than or equal to 17 seconds for improved transfer efficiency and safety.   Time 8   Status New     PT LONG TERM GOAL #3   Title Pt will improve TUG manual score to less than or equal to 14.5 seconds for decreased fall risk.   Time 8   Period Weeks   Status New     PT LONG TERM GOAL #4   Title Pt will improve FGA score to at least 23/30 for decreased fall risk, improved gait efficiency and safety.   Time 8   Period Weeks   Status New     PT LONG TERM GOAL #5   Title Pt will verbalize plans for continued community fitness upon D/C from PT.   Time 8   Period Weeks   Status New               Plan - 09/01/16 1439    Clinical Impression Statement Pt met all goals checked today demonstrating progress with gait mechanics and standing balance.  During SLS ( alt tapping activity with visual scanning task) pt requires intermittent UE support.   Rehab Potential Good   PT Frequency 2x / week   PT Duration 8 weeks   PT Treatment/Interventions ADLs/Self Care Home Management;Functional mobility training;Gait training;Therapeutic activities;Therapeutic exercise;Balance training;Neuromuscular re-education;Patient/family education   PT Next Visit Plan Ask about scheduling- if pt wants to schedule out to 4/28?, Check cognitive TUG.   Consulted and Agree with Plan of Care Patient      Patient will benefit from skilled therapeutic intervention in order to improve the following deficits and impairments:  Abnormal gait, Decreased balance, Decreased mobility, Decreased strength, Difficulty walking, Postural dysfunction  Visit Diagnosis: Abnormal posture  Unsteadiness on feet  Other abnormalities of gait and mobility     Problem List Patient Active Problem List   Diagnosis Date Noted  . COPD, mild  ([place]) 04/09/2014  . Unstable angina ([place]) 01/17/2014  . Mitral regurgitation   . GERD (gastroesophageal reflux disease)   . DM (diabetes mellitus) ([place])   . Asymptomatic LV dysfunction   . CAD (coronary artery disease) 01/15/2014  . Abnormal nuclear cardiac imaging test 01/14/2014  . Chronic renal disease, stage III 01/14/2014  . Sinus bradycardia- HR 43 01/14/2014  . Essential hypertension 11/16/2013  . Hyperlipidemia 11/16/2013  . Depressive disorder, not elsewhere classified 05/08/2013    Bjorn Loser, PTA  09/01/16, 4:09 PM [facility] [address] [place], [place], [address] Phone: [phone]   Fax:  [phone]  Name: Ronald Benitez MRN: [id number] Date of Birth: 04-28-1929

## 2016-09-02 ENCOUNTER — Ambulatory Visit: Payer: Medicare Other | Admitting: Occupational Therapy

## 2016-09-02 ENCOUNTER — Ambulatory Visit: Payer: Medicare Other | Admitting: Physical Therapy

## 2016-09-02 ENCOUNTER — Encounter: Payer: Self-pay | Admitting: Physical Therapy

## 2016-09-02 DIAGNOSIS — R29818 Other symptoms and signs involving the nervous system: Secondary | ICD-10-CM | POA: Diagnosis not present

## 2016-09-02 DIAGNOSIS — R41844 Frontal lobe and executive function deficit: Secondary | ICD-10-CM

## 2016-09-02 DIAGNOSIS — R293 Abnormal posture: Secondary | ICD-10-CM

## 2016-09-02 DIAGNOSIS — R2689 Other abnormalities of gait and mobility: Secondary | ICD-10-CM

## 2016-09-02 DIAGNOSIS — R29898 Other symptoms and signs involving the musculoskeletal system: Secondary | ICD-10-CM

## 2016-09-02 DIAGNOSIS — R2681 Unsteadiness on feet: Secondary | ICD-10-CM

## 2016-09-02 DIAGNOSIS — R278 Other lack of coordination: Secondary | ICD-10-CM

## 2016-09-02 NOTE — Therapy (Signed)
[facility] [address] [place], [place], [address] Phone: [phone]   Fax:  [phone]  Physical Therapy Treatment  Patient Details  Name: Ronald Benitez MRN: [id number] Date of Birth: Dec 04, 1928 Referring Provider: Alonza Bogus  Encounter Date: 09/02/2016      PT End of Session - 09/02/16 1447    Visit Number 7   Number of Visits 17   Date for PT Re-Evaluation 10/02/16   Authorization Type Blue Medicare HMO-GCODE every 10th visit   PT Start Time 1400   PT Stop Time 1438   PT Time Calculation (min) 38 min      Past Medical History:  Diagnosis Date  . Asymptomatic LV dysfunction    a. 2D ECHO on 12/04/2013. LV EF: 50% - 55%; G3DD, mild AR, mod MR, mild RA dilation, increased thickness of atrial septum, c/w lipomatous hypertrophy. Small patent foramen ovale. Present only with provocation from having the patient cough. Agitated saline contrast study showed a very small right-to-left atrial level shunt, in the baseline state.  Marland Kitchen CAD (coronary artery disease)    a. 01/16/14 s/p DES x2 to RCA. b remote RCA stenting 1997   . Chronic kidney disease   . Depression   . DM (diabetes mellitus) ([place])   . GERD (gastroesophageal reflux disease)   . HTN (hypertension)   . Hyperlipidemia   . Mitral regurgitation    a. mod by 11/2013 ECHO    Past Surgical History:  Procedure Laterality Date  . ANGIOPLASTY  1997  . ESOPHAGOGASTRODUODENOSCOPY ENDOSCOPY    . LEFT HEART CATHETERIZATION WITH CORONARY ANGIOGRAM N/A 01/15/2014   Procedure: LEFT HEART CATHETERIZATION WITH CORONARY ANGIOGRAM;  Surgeon: Lorretta Harp, MD;  Location: [facility] CATH LAB;  Service: Cardiovascular;  Laterality: N/A;  . PERCUTANEOUS CORONARY ROTOBLATOR INTERVENTION (PCI-R) N/A 01/16/2014   Procedure: PERCUTANEOUS CORONARY ROTOBLATOR INTERVENTION (PCI-R);  Surgeon: Troy Sine, MD;  Location: [facility] CATH LAB;  Service: Cardiovascular;  Laterality: N/A;     There were no vitals filed for this visit.      Subjective Assessment - 09/02/16 1402    Subjective No changes from yesterday.   How long can you walk comfortably? No    Currently in Pain? No/denies   Pain Onset More than a month ago            [facility] PT Assessment - 09/02/16 0001      Timed Up and Go Test   TUG Cognitive TUG   Cognitive TUG (seconds) 17.9   TUG Comments Scores >13.5-15 seconds indicates increased fall risk.                     [facility] Adult PT Treatment/Exercise - 09/02/16 0001      Ambulation/Gait   Ambulation/Gait Yes   Ambulation/Gait Assistance 4: Min guard   Ambulation/Gait Assistance Details raising and lowering ball every 3 steps to work on upright posture.  cues full extension with initial heelstrike with each foot   Ambulation Distance (Feet) 115 Feet  x3 +   Assistive device None   Gait Pattern Step-through pattern;Decreased arm swing - left;Decreased step length - right;Decreased step length - left;Lateral trunk lean to right;Trunk flexed;Narrow base of support;Poor foot clearance - left;Poor foot clearance - right   Ambulation Surface Level;Indoor             Balance Exercises - 09/02/16 1402      Balance Exercises: Standing   Standing Eyes Opened Wide (  BOA);Foam/compliant surface;Head turns   Stepping Strategy Anterior;Foam/compliant surface  cues for weight shifting forward, min A/ intermittent UE support   Gait with Head Turns Forward  115' cues for posture.   Sidestepping 4 reps  cues for full knee extension initially with each step.            PT Short Term Goals - 09/02/16 1534      PT SHORT TERM GOAL #1   Title Pt will perform HEP with family supervision, for improved functional mobility, gait and transfers.  TARGET 09/01/16   Baseline Pt reports performing consistently on his own and has no questions about HEP; 09/01/16   Time 4   Period Weeks   Status Achieved     PT SHORT TERM GOAL #2   Title  Pt will improve 5x sit<>stand to less than or equal to 20 seconds for improved transfer efficiency and safety.   Baseline 5X sit<>stands 18 sec without UE support; 09/01/16.   Time 4   Period Weeks   Status Achieved     PT SHORT TERM GOAL #3   Title Pt will improve TUG cog score to less than or equal to 15 seconds for decreased fall risk.   Baseline TUG Cognitive 17.9 sec   Time 4   Period Weeks   Status Not Met     PT SHORT TERM GOAL #4   Title Pt will improve Functional Gait Assessment to at least 20/30 for decreased fall risk.   Baseline FGA 20/30; 09/01/16.   Time 4   Period Weeks   Status Achieved           PT Long Term Goals - 08/03/16 1416      PT LONG TERM GOAL #1   Title Pt will verbalize understanding of fall prevention in the home environment.  TARGET 10/02/16   Time 8   Period Weeks   Status New     PT LONG TERM GOAL #2   Title Pt will improve 5x sit<>stand to less than or equal to 17 seconds for improved transfer efficiency and safety.   Time 8   Status New     PT LONG TERM GOAL #3   Title Pt will improve TUG manual score to less than or equal to 14.5 seconds for decreased fall risk.   Time 8   Period Weeks   Status New     PT LONG TERM GOAL #4   Title Pt will improve FGA score to at least 23/30 for decreased fall risk, improved gait efficiency and safety.   Time 8   Period Weeks   Status New     PT LONG TERM GOAL #5   Title Pt will verbalize plans for continued community fitness upon D/C from PT.   Time 8   Period Weeks   Status New               Plan - 09/02/16 1534    Clinical Impression Statement Pt did not meet STG # 3 congnitive TUG but did improve normal TUG. Performed compliant surface standing training; pt required intermittent UE support to min A to maintain balance with head turns.  Pt had  good carry over with  gait mechanics during session with cues to extend knee with initial heel strike demonstrating better posture and foot  clearance.  Rehab Potential Good   PT Frequency 2x / week   PT Duration 8 weeks   PT Treatment/Interventions ADLs/Self Care Home Management;Functional mobility training;Gait training;Therapeutic activities;Therapeutic exercise;Balance training;Neuromuscular re-education;Patient/family education   PT Next Visit Plan Ask about scheduling- if pt wants to schedule out to 4/28?, balance on compliant surface, stepping strategies.   Consulted and Agree with Plan of Care Patient      Patient will benefit from skilled therapeutic intervention in order to improve the following deficits and impairments:  Abnormal gait, Decreased balance, Decreased mobility, Decreased strength, Difficulty walking, Postural dysfunction  Visit Diagnosis: Abnormal posture  Unsteadiness on feet  Other abnormalities of gait and mobility     Problem List Patient Active Problem List   Diagnosis Date Noted  . COPD, mild ([place]) 04/09/2014  . Unstable angina ([place]) 01/17/2014  . Mitral regurgitation   . GERD (gastroesophageal reflux disease)   . DM (diabetes mellitus) ([place])   . Asymptomatic LV dysfunction   . CAD (coronary artery disease) 01/15/2014  . Abnormal nuclear cardiac imaging test 01/14/2014  . Chronic renal disease, stage III 01/14/2014  . Sinus bradycardia- HR 43 01/14/2014  . Essential hypertension 11/16/2013  . Hyperlipidemia 11/16/2013  . Depressive disorder, not elsewhere classified 05/08/2013    Bjorn Loser, PTA  09/02/16, 3:42 PM [facility] [address] [place], [place], [address] Phone: [phone]   Fax:  [phone]  Name: BRENTLEY LANDFAIR MRN: [id number] Date of Birth: 27-Dec-1928

## 2016-09-02 NOTE — Patient Instructions (Addendum)
Laying on your back, hold a ball between your hands , raise arms up to shoulder height( only if it is not painful) make sure your shoulder and shoulder blades are back against the bed, lower ball back to your waist. Keep elbows straight.   Perform 2 sets of 10 reps 1x day  Lying on your back, hold a ball in between both hands, straighten elbows towards the ceiling, then bend elbows and bring ball to your chest     Perform 2 sets of 10 reps 1x day

## 2016-09-02 NOTE — Therapy (Signed)
[facility] [address] [place], [place], [address] Phone: [phone]   Fax:  [phone]  Occupational Therapy Treatment  Patient Details  Name: Ronald Benitez MRN: [id number] Date of Birth: Jan 31, 1929 Referring Provider: Dr. Wells Guiles Tat  Encounter Date: 09/02/2016      OT End of Session - 09/02/16 1342    Visit Number 6   Number of Visits 17   Date for OT Re-Evaluation 10/02/16   Authorization Type Blue [employer], no visit limit, no auth; G code needed   Authorization Time Period anticipate 6-8 wks needed   Authorization - Number of Visits 10   OT Start Time 1318   OT Stop Time 1400   OT Time Calculation (min) 42 min   Activity Tolerance Patient tolerated treatment well      Past Medical History:  Diagnosis Date  . Asymptomatic LV dysfunction    a. 2D ECHO on 12/04/2013. LV EF: 50% - 55%; G3DD, mild AR, mod MR, mild RA dilation, increased thickness of atrial septum, c/w lipomatous hypertrophy. Small patent foramen ovale. Present only with provocation from having the patient cough. Agitated saline contrast study showed a very small right-to-left atrial level shunt, in the baseline state.  Marland Kitchen CAD (coronary artery disease)    a. 01/16/14 s/p DES x2 to RCA. b remote RCA stenting 1997   . Chronic kidney disease   . Depression   . DM (diabetes mellitus) ([place])   . GERD (gastroesophageal reflux disease)   . HTN (hypertension)   . Hyperlipidemia   . Mitral regurgitation    a. mod by 11/2013 ECHO    Past Surgical History:  Procedure Laterality Date  . ANGIOPLASTY  1997  . ESOPHAGOGASTRODUODENOSCOPY ENDOSCOPY    . LEFT HEART CATHETERIZATION WITH CORONARY ANGIOGRAM N/A 01/15/2014   Procedure: LEFT HEART CATHETERIZATION WITH CORONARY ANGIOGRAM;  Surgeon: Lorretta Harp, MD;  Location: [facility] CATH LAB;  Service: Cardiovascular;  Laterality: N/A;  . PERCUTANEOUS CORONARY ROTOBLATOR INTERVENTION (PCI-R) N/A 01/16/2014   Procedure: PERCUTANEOUS CORONARY ROTOBLATOR INTERVENTION (PCI-R);  Surgeon: Troy Sine, MD;  Location: [facility] CATH LAB;  Service: Cardiovascular;  Laterality: N/A;    There were no vitals filed for this visit.      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 Yes   Pain Score 5    Pain Location Shoulder   Pain Orientation Right;Left   Pain Descriptors / Indicators Aching   Pain Type Acute pain   Pain Onset More than a month ago   Pain Frequency Intermittent   Aggravating Factors  movements   Pain Relieving Factors rest   Effect of Pain on Daily Activities limits functional use            Treatment: Pt was instructed in handwriting strategies. Pt requires mod v.c to slow down and for larger letter size. Pt  Practiced writing continuous "l" Pt does better with printing.                     OT Education - 09/02/16 1355    Education provided Yes   Education Details ball ex in supine- 10-15 reps each, handwriting strategies along with handwriting handout   Person(s) Educated Patient   Methods Explanation;Demonstration;Verbal cues;Handout  Comprehension Verbalized understanding;Returned demonstration;Verbal cues required          OT Short Term Goals - 09/02/16 1335      OT SHORT TERM GOAL #1   Title Pt will be independent with PD-specific HEP--check STGs 09/01/16   Time 4   Period Weeks   Status Achieved     OT SHORT TERM GOAL #2   Title Pt will improve balance and functional reaching for ADLs as shown by improving standing functional reach by at least 2 inches with RUE.   Baseline 8"   Time 4   Period Weeks   Status On-going     OT SHORT TERM GOAL #3   Title Pt will improve abilty to dress as shown by improving time  on PPT#4 by at least 5sec.   Baseline 61.97sec   Time 4   Period Weeks   Status On-going     OT SHORT TERM GOAL #4   Title Pt will be able to write numbers/simple phrases with 100% legibility and no significant decr in size in order to be able to continue to complete taxes.   Baseline 90%   Time 4   Period Weeks   Status On-going     OT SHORT TERM GOAL #5   Title Pt will verbalize understanding of ways to prevent future complications and PD-related community resources.   Time 4   Period Weeks   Status On-going           OT Long Term Goals - 08/03/16 1239      OT LONG TERM GOAL #1   Title Pt will verbalize understanding of adaptive strategies to incr ease/safety/independence with ADLs/IADL--check LTGs 10/02/16   Time 8   Period Weeks   Status New     OT LONG TERM GOAL #2   Title Pt will improve bilateral functional reaching/coordination as shown by improving score on box and blocks test by at least 4 bilaterally.   Baseline R-43 blocks, L-39 blocks   Time 8   Period Weeks   Status New     OT LONG TERM GOAL #3   Title Pt will improve abilty to dress as shown by improving time on PPT#4 by at least 10sec.   Baseline 61.97sec    Time 8   Period Weeks   Status New     OT LONG TERM GOAL #4   Title Pt will improve LUE functional reaching as shown by demo at least 110* shoulder flex and at least -25* elbow ext.   Baseline 105* shoulder flex, -25* elbow ext   Time 8   Period Weeks   Status New     OT LONG TERM GOAL #5   Title Pt will report bilateral shoulder pain less than or equal to 4/10 for ADLs.   Baseline 7/10   Time 8   Period Weeks   Status New               Plan - 09/02/16 1336    Clinical Impression Statement Pt is progressing towards goals. He demonstrates understanding of ball exercises in supine.   Rehab Potential Fair   OT Frequency 2x / week   OT Duration 8 weeks   OT Treatment/Interventions Self-care/ADL training;Energy  conservation;Manual Therapy;Cognitive remediation/compensation;Passive range of motion;Functional Mobility Training;Neuromuscular education;Cryotherapy;Therapeutic exercise;Ultrasound;Therapeutic activities;Therapeutic exercises;Balance training;Patient/family education;Splinting;DME and/or AE instruction;Moist Heat   Plan check short term goals next week   OT Home Exercise Plan Education provided:  supine PWR! (up, rock, twist), ball  ex in supine, handwriting strategies   Consulted and Agree with Plan of Care Patient      Patient will benefit from skilled therapeutic intervention in order to improve the following deficits and impairments:  Decreased coordination, Decreased range of motion, Abnormal gait, Pain, Impaired UE functional use, Decreased knowledge of use of DME, Decreased balance, Decreased cognition, Decreased mobility, Decreased strength, Impaired perceived functional ability, Impaired tone, Decreased activity tolerance, Decreased endurance, Decreased safety awareness, Improper spinal/pelvic alignment, Decreased knowledge of precautions  Visit Diagnosis: No diagnosis found.    Problem List Patient Active Problem List   Diagnosis Date Noted  . COPD, mild ([place]) 04/09/2014  . Unstable angina ([place]) 01/17/2014  . Mitral regurgitation   . GERD (gastroesophageal reflux disease)   . DM (diabetes mellitus) ([place])   . Asymptomatic LV dysfunction   . CAD (coronary artery disease) 01/15/2014  . Abnormal nuclear cardiac imaging test 01/14/2014  . Chronic renal disease, stage III 01/14/2014  . Sinus bradycardia- HR 43 01/14/2014  . Essential hypertension 11/16/2013  . Hyperlipidemia 11/16/2013  . Depressive disorder, not elsewhere classified 05/08/2013    Canio Winokur 09/02/2016, 4:58 PM  [facility] [address] [place], [place], [address] Phone: [phone]   Fax:  [phone]  Name: Ronald Benitez MRN:  [id number] Date of Birth: 27-Apr-1929

## 2016-09-08 ENCOUNTER — Ambulatory Visit: Payer: Medicare Other

## 2016-09-08 ENCOUNTER — Ambulatory Visit: Payer: Medicare Other | Admitting: Occupational Therapy

## 2016-09-08 ENCOUNTER — Encounter: Payer: Self-pay | Admitting: Physical Therapy

## 2016-09-08 ENCOUNTER — Ambulatory Visit: Payer: Medicare Other | Attending: Internal Medicine | Admitting: Physical Therapy

## 2016-09-08 DIAGNOSIS — M25612 Stiffness of left shoulder, not elsewhere classified: Secondary | ICD-10-CM | POA: Diagnosis present

## 2016-09-08 DIAGNOSIS — R278 Other lack of coordination: Secondary | ICD-10-CM | POA: Insufficient documentation

## 2016-09-08 DIAGNOSIS — R41844 Frontal lobe and executive function deficit: Secondary | ICD-10-CM | POA: Diagnosis present

## 2016-09-08 DIAGNOSIS — R471 Dysarthria and anarthria: Secondary | ICD-10-CM

## 2016-09-08 DIAGNOSIS — R41841 Cognitive communication deficit: Secondary | ICD-10-CM | POA: Insufficient documentation

## 2016-09-08 DIAGNOSIS — R293 Abnormal posture: Secondary | ICD-10-CM

## 2016-09-08 DIAGNOSIS — R2681 Unsteadiness on feet: Secondary | ICD-10-CM | POA: Diagnosis present

## 2016-09-08 DIAGNOSIS — R29898 Other symptoms and signs involving the musculoskeletal system: Secondary | ICD-10-CM | POA: Insufficient documentation

## 2016-09-08 DIAGNOSIS — M25611 Stiffness of right shoulder, not elsewhere classified: Secondary | ICD-10-CM | POA: Diagnosis present

## 2016-09-08 DIAGNOSIS — R29818 Other symptoms and signs involving the nervous system: Secondary | ICD-10-CM | POA: Diagnosis present

## 2016-09-08 DIAGNOSIS — R2689 Other abnormalities of gait and mobility: Secondary | ICD-10-CM | POA: Insufficient documentation

## 2016-09-08 NOTE — Therapy (Signed)
[facility] [address] [place], [place], [address] Phone: [phone]   Fax:  [phone]  Speech Language Pathology Treatment  Patient Details  Name: Ronald Benitez MRN: [id number] Date of Birth: December 25, 1928 Referring Provider: Dr. Wells Guiles Tat  Encounter Date: 09/08/2016      End of Session - 09/08/16 1524    Visit Number 5   Number of Visits 12   Date for SLP Re-Evaluation 09/10/16   SLP Start Time 1449   SLP Stop Time  1530   SLP Time Calculation (min) 41 min   Activity Tolerance Patient tolerated treatment well      Past Medical History:  Diagnosis Date  . Asymptomatic LV dysfunction    a. 2D ECHO on 12/04/2013. LV EF: 50% - 55%; G3DD, mild AR, mod MR, mild RA dilation, increased thickness of atrial septum, c/w lipomatous hypertrophy. Small patent foramen ovale. Present only with provocation from having the patient cough. Agitated saline contrast study showed a very small right-to-left atrial level shunt, in the baseline state.  Marland Kitchen CAD (coronary artery disease)    a. 01/16/14 s/p DES x2 to RCA. b remote RCA stenting 1997   . Chronic kidney disease   . Depression   . DM (diabetes mellitus) ([place])   . GERD (gastroesophageal reflux disease)   . HTN (hypertension)   . Hyperlipidemia   . Mitral regurgitation    a. mod by 11/2013 ECHO    Past Surgical History:  Procedure Laterality Date  . ANGIOPLASTY  1997  . ESOPHAGOGASTRODUODENOSCOPY ENDOSCOPY    . LEFT HEART CATHETERIZATION WITH CORONARY ANGIOGRAM N/A 01/15/2014   Procedure: LEFT HEART CATHETERIZATION WITH CORONARY ANGIOGRAM;  Surgeon: Lorretta Harp, MD;  Location: [facility] CATH LAB;  Service: Cardiovascular;  Laterality: N/A;  . PERCUTANEOUS CORONARY ROTOBLATOR INTERVENTION (PCI-R) N/A 01/16/2014   Procedure: PERCUTANEOUS CORONARY ROTOBLATOR INTERVENTION (PCI-R);  Surgeon: Troy Sine, MD;  Location: [facility] CATH LAB;  Service: Cardiovascular;  Laterality: N/A;     There were no vitals filed for this visit.      Subjective Assessment - 09/08/16 1453    Subjective SLP needed to cue pt for intelligibility strategies (overarticulation and abdominal breathing).   Currently in Pain? No/denies               ADULT SLP TREATMENT - 09/08/16 1457      General Information   Behavior/Cognition Alert;Cooperative;Pleasant mood;Hard of hearing     Treatment Provided   Treatment provided Cognitive-Linquistic     Cognitive-Linquistic Treatment   Treatment focused on Dysarthria   Skilled Treatment Pt's volume during session today was consistently in low 70sdB. In structured sentence tasks, pt was intelligible 95% of the time. In short answer/conversation between stimuli/ tasks pt req'd rare min A for strategies. As pt's onversational response lengthened his use of overarticulation decr'd.      Assessment / Recommendations / Plan   Plan Continue with current plan of care     Progression Toward Goals   Progression toward goals Progressing toward goals            SLP Short Term Goals - 09/08/16 1525      SLP SHORT TERM GOAL #1   Title Pt will verbalize process of diaphragmatic breathing with mod cues for accuracy   Status Achieved     SLP SHORT TERM GOAL #2   Title Pt will demonstrate diaphragmatic breathing during 2 therapy sessions   Status Achieved     SLP  SHORT TERM GOAL #3   Title Pt will identify and implement compensatory strategy for improved functional recall with mod cues for use outside of therapy   Status Deferred  focus on speech intelligibility          SLP Long Term Goals - 09/08/16 1526      SLP LONG TERM GOAL #1   Title Pt will verbalize the process of diaphragmatic breathing with min cues for accuracy   Time 3   Period Weeks   Status On-going     SLP LONG TERM GOAL #2   Title Pt will demonstrate diaphragmatic breathing during functional tasks during therapy, and report practice at home   Time 3   Period  Weeks   Status On-going     SLP LONG TERM GOAL #3   Title Pt will report use of compensatory strategy for improved functional recall between sessions   Time 3   Period Weeks   Status On-going     SLP LONG TERM GOAL #4   Title in simple to mod complex conversation of 10 minutes, pt will maintain intelligibility of 95% over three sessions   Time 3   Period Weeks   Status New          Plan - 09/08/16 1524    Clinical Impression Statement Pt presented today with WNL loudness, and again with slightly reduced articulatory precision upon entering ST room. His use of abdominal breathing remained good in conversation, even over two week break in ST. Suspect 1-2 more weeks prior to d/c. Skilled ST remains necessary to assist pt in carryover of clearer articulation as well as use of abdominal breathing across speaking situations.   Speech Therapy Frequency 2x / week   Treatment/Interventions Functional tasks;Patient/family education;SLP instruction and feedback   Potential to Achieve Goals Good   Potential Considerations Ability to learn/carryover information      Patient will benefit from skilled therapeutic intervention in order to improve the following deficits and impairments:   Dysarthria  Cognitive communication deficit    Problem List Patient Active Problem List   Diagnosis Date Noted  . COPD, mild ([place]) 04/09/2014  . Unstable angina ([place]) 01/17/2014  . Mitral regurgitation   . GERD (gastroesophageal reflux disease)   . DM (diabetes mellitus) ([place])   . Asymptomatic LV dysfunction   . CAD (coronary artery disease) 01/15/2014  . Abnormal nuclear cardiac imaging test 01/14/2014  . Chronic renal disease, stage III 01/14/2014  . Sinus bradycardia- HR 43 01/14/2014  . Essential hypertension 11/16/2013  . Hyperlipidemia 11/16/2013  . Depressive disorder, not elsewhere classified 05/08/2013    [facility] ,[place], [place]  09/08/2016, 3:28 PM  [facility] [address] [place], [place], [address] Phone: [phone]   Fax:  [phone]   Name: Ronald Benitez MRN: [id number] Date of Birth: [birth year]

## 2016-09-08 NOTE — Therapy (Signed)
[facility] [address] [place], [place], [address] Phone: [phone]   Fax:  [phone]  Physical Therapy Treatment  Patient Details  Name: Ronald Benitez MRN: [id number] Date of Birth: [birth year] Referring Provider: Alonza Bogus  Encounter Date: 09/08/2016      PT End of Session - 09/08/16 1403    Visit Number 8   Number of Visits 17   Date for PT Re-Evaluation 10/02/16   Authorization Type Blue Medicare [id number]-GCODE every 10th visit   PT Start Time 1320   PT Stop Time 1400   PT Time Calculation (min) 40 min      Past Medical History:  Diagnosis Date  . Asymptomatic LV dysfunction    a. 2D ECHO on 12/04/2013. LV EF: 50% - 55%; G3DD, mild AR, mod MR, mild RA dilation, increased thickness of atrial septum, c/w lipomatous hypertrophy. Small patent foramen ovale. Present only with provocation from having the patient cough. Agitated saline contrast study showed a very small right-to-left atrial level shunt, in the baseline state.  Marland Kitchen CAD (coronary artery disease)    a. 01/16/14 s/p DES x2 to RCA. b remote RCA stenting 1997   . Chronic kidney disease   . Depression   . DM (diabetes mellitus) ([place])   . GERD (gastroesophageal reflux disease)   . HTN (hypertension)   . Hyperlipidemia   . Mitral regurgitation    a. mod by 11/2013 ECHO    Past Surgical History:  Procedure Laterality Date  . ANGIOPLASTY  1997  . ESOPHAGOGASTRODUODENOSCOPY ENDOSCOPY    . LEFT HEART CATHETERIZATION WITH CORONARY ANGIOGRAM N/A 01/15/2014   Procedure: LEFT HEART CATHETERIZATION WITH CORONARY ANGIOGRAM;  Surgeon: Lorretta Harp, MD;  Location: [facility] CATH LAB;  Service: Cardiovascular;  Laterality: N/A;  . PERCUTANEOUS CORONARY ROTOBLATOR INTERVENTION (PCI-R) N/A 01/16/2014   Procedure: PERCUTANEOUS CORONARY ROTOBLATOR INTERVENTION (PCI-R);  Surgeon: Troy Sine, MD;  Location: [facility] CATH LAB;  Service: Cardiovascular;  Laterality: N/A;     There were no vitals filed for this visit.      Subjective Assessment - 09/08/16 1322    Subjective No falls since last week. Pt decided not to schedule more due insurance/financial reasons.   How long can you walk comfortably? No    Currently in Pain? No/denies   Pain Onset More than a month ago                         [facility] Adult PT Treatment/Exercise - 09/08/16 0001      Ambulation/Gait   Ambulation/Gait Yes   Ambulation/Gait Assistance 4: Min guard   Ambulation/Gait Assistance Details raising and lowering ball every 3 steps to work on upright posture.   Ambulation Distance (Feet) 230 Feet   Assistive device None   Gait Pattern Step-through pattern;Decreased arm swing - left;Decreased step length - right;Decreased step length - left;Lateral trunk lean to right;Trunk flexed;Narrow base of support;Poor foot clearance - left;Poor foot clearance - right   Ambulation Surface Level;Indoor           [facility]) - 09/08/16 1357 STANDING   PWR! Rock x10   PWR! Twist x10   Comments minimal UE movment due to shoulder pain; cues for weight shifting          Balance Exercises - 09/08/16 1337      Balance Exercises: Standing   Stepping Strategy Anterior;Posterior;Lateral  alt LE stespping, cues for posture and increasing step length  Marching Limitations marching forward in parallel bars attempting 1 to no UE support   Sit to Stand Time without UE support, holding ball in front, 2x10   Other Standing Exercises walkinng forward in parallel bars working on emphasizing inital heelstrike.             PT Short Term Goals - 09/02/16 1534      PT SHORT TERM GOAL #1   Title Pt will perform HEP with family supervision, for improved functional mobility, gait and transfers.  TARGET 09/01/16   Baseline Pt reports performing consistently on his own and has no questions about HEP; 09/01/16   Time 4   Period Weeks   Status Achieved     PT SHORT TERM GOAL #2    Title Pt will improve 5x sit<>stand to less than or equal to 20 seconds for improved transfer efficiency and safety.   Baseline 5X sit<>stands 18 sec without UE support; 09/01/16.   Time 4   Period Weeks   Status Achieved     PT SHORT TERM GOAL #3   Title Pt will improve TUG cog score to less than or equal to 15 seconds for decreased fall risk.   Baseline TUG Cognitive 17.9 sec   Time 4   Period Weeks   Status Not Met     PT SHORT TERM GOAL #4   Title Pt will improve Functional Gait Assessment to at least 20/30 for decreased fall risk.   Baseline FGA 20/30; 09/01/16.   Time 4   Period Weeks   Status Achieved           PT Long Term Goals - 08/03/16 1416      PT LONG TERM GOAL #1   Title Pt will verbalize understanding of fall prevention in the home environment.  TARGET 10/02/16   Time 8   Period Weeks   Status New     PT LONG TERM GOAL #2   Title Pt will improve 5x sit<>stand to less than or equal to 17 seconds for improved transfer efficiency and safety.   Time 8   Status New     PT LONG TERM GOAL #3   Title Pt will improve TUG manual score to less than or equal to 14.5 seconds for decreased fall risk.   Time 8   Period Weeks   Status New     PT LONG TERM GOAL #4   Title Pt will improve FGA score to at least 23/30 for decreased fall risk, improved gait efficiency and safety.   Time 8   Period Weeks   Status New     PT LONG TERM GOAL #5   Title Pt will verbalize plans for continued community fitness upon D/C from PT.   Time 8   Period Weeks   Status New               Plan - 09/08/16 1354    Clinical Impression Statement Pt seemed more fatigue this session with more slightly LOB with gait.  Required cue for posture and to increase steplength and heel strike with gait and balance activities.  Pt required min guard to supervison with dynamic gait and and standing balance activities.    Rehab Potential Good   PT Frequency 2x / week   PT Duration 8 weeks    PT Treatment/Interventions ADLs/Self Care Home Management;Functional mobility training;Gait training;Therapeutic activities;Therapeutic exercise;Balance training;Neuromuscular re-education;Patient/family education   PT Next Visit Plan Ask about scheduling- if pt wants  to schedule out to 4/28?, balance on compliant surface, stepping strategies.   Consulted and Agree with Plan of Care Patient      Patient will benefit from skilled therapeutic intervention in order to improve the following deficits and impairments:  Abnormal gait, Decreased balance, Decreased mobility, Decreased strength, Difficulty walking, Postural dysfunction  Visit Diagnosis: Other abnormalities of gait and mobility  Abnormal posture  Unsteadiness on feet     Problem List Patient Active Problem List   Diagnosis Date Noted  . COPD, mild ([place]) 04/09/2014  . Unstable angina ([place]) 01/17/2014  . Mitral regurgitation   . GERD (gastroesophageal reflux disease)   . DM (diabetes mellitus) ([place])   . Asymptomatic LV dysfunction   . CAD (coronary artery disease) 01/15/2014  . Abnormal nuclear cardiac imaging test 01/14/2014  . Chronic renal disease, stage III 01/14/2014  . Sinus bradycardia- HR 43 01/14/2014  . Essential hypertension 11/16/2013  . Hyperlipidemia 11/16/2013  . Depressive disorder, not elsewhere classified 05/08/2013    Bjorn Loser, PTA  09/08/16, 4:11 PM [facility] [address] [place], [place], [address] Phone: [phone]   Fax:  [phone]  Name: KOLTEN RYBACK MRN: [id number] Date of Birth: [birth year]

## 2016-09-08 NOTE — Patient Instructions (Signed)
  Please complete the assigned speech therapy homework prior to your next session and return it to the speech therapist at your next visit.  

## 2016-09-09 NOTE — Therapy (Signed)
[facility] [address] [place], [place], [address] Phone: [phone]   Fax:  [phone]  Occupational Therapy Treatment  Patient Details  Name: Ronald Benitez MRN: [id number] Date of Birth: 03/28/1929 Referring Provider: Dr. Wells Guiles Tat  Encounter Date: 09/08/2016      OT End of Session - 09/08/16 1415    Visit Number 7   Number of Visits 17   Date for OT Re-Evaluation 10/02/16   Authorization Type Blue Medicare, no visit limit, no auth; G code needed   Authorization Time Period anticipate 6-8 wks needed   Authorization - Visit Number 7   Authorization - Number of Visits 10   OT Start Time 6962   OT Stop Time 1445   OT Time Calculation (min) 40 min   Activity Tolerance Patient tolerated treatment well   Behavior During Therapy [facility] for tasks assessed/performed      Past Medical History:  Diagnosis Date  . Asymptomatic LV dysfunction    a. 2D ECHO on 12/04/2013. LV EF: 50% - 55%; G3DD, mild AR, mod MR, mild RA dilation, increased thickness of atrial septum, c/w lipomatous hypertrophy. Small patent foramen ovale. Present only with provocation from having the patient cough. Agitated saline contrast study showed a very small right-to-left atrial level shunt, in the baseline state.  Marland Kitchen CAD (coronary artery disease)    a. 01/16/14 s/p DES x2 to RCA. b remote RCA stenting 1997   . Chronic kidney disease   . Depression   . DM (diabetes mellitus) ([place])   . GERD (gastroesophageal reflux disease)   . HTN (hypertension)   . Hyperlipidemia   . Mitral regurgitation    a. mod by 11/2013 ECHO    Past Surgical History:  Procedure Laterality Date  . ANGIOPLASTY  1997  . ESOPHAGOGASTRODUODENOSCOPY ENDOSCOPY    . LEFT HEART CATHETERIZATION WITH CORONARY ANGIOGRAM N/A 01/15/2014   Procedure: LEFT HEART CATHETERIZATION WITH CORONARY ANGIOGRAM;  Surgeon: Lorretta Harp, MD;  Location: [facility] CATH LAB;  Service: Cardiovascular;   Laterality: N/A;  . PERCUTANEOUS CORONARY ROTOBLATOR INTERVENTION (PCI-R) N/A 01/16/2014   Procedure: PERCUTANEOUS CORONARY ROTOBLATOR INTERVENTION (PCI-R);  Surgeon: Troy Sine, MD;  Location: [facility] CATH LAB;  Service: Cardiovascular;  Laterality: N/A;    There were no vitals filed for this visit.      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 Yes   Pain Score 3    Pain Location Shoulder   Pain Orientation Right   Pain Descriptors / Indicators Aching   Pain Type Acute pain   Pain Onset More than a month ago   Pain Frequency Intermittent   Aggravating Factors  malpositioning   Pain Relieving Factors repositioning   Effect of Pain on Daily Activities limits functional use             Treatment: Reviewed handwriting strategies, pt practiced copying a list of phone numbers then writing with grossly 90% legibility, min v.c. For techniques. Pt practiced donning/ doffing jacket various ways, he is limited by shoulder pain and cognition and continues to swing jacket behind back with greatest success( mod/ max difficulty) Arm bike x 5 mins level 1, pt maintained grossly 30-35  rpm Education performed regarding community resources and ways to prevent future PD related complications.                   OT Short Term Goals - 09/08/16 1412      OT SHORT TERM GOAL #1   Title Pt will be independent with PD-specific HEP--check STGs 09/01/16   Time 4   Period Weeks   Status Achieved     OT SHORT TERM GOAL #2   Title Pt will improve balance and functional reaching for ADLs as shown by improving standing functional reach by at least 2 inches with RUE.   Baseline 8"   Time 4   Period  Weeks   Status On-going     OT SHORT TERM GOAL #3   Title Pt will improve abilty to dress as shown by improving time on PPT#4 by at least 5sec.   Baseline 61.97sec   Time 4   Period Weeks   Status On-going     OT SHORT TERM GOAL #4   Title Pt will be able to write numbers/simple phrases with 100% legibility and no significant decr in size in order to be able to continue to complete taxes.   Baseline 90%   Time 4   Period Weeks   Status On-going  90% legible, min decrease in letter size     OT SHORT TERM GOAL #5   Title Pt will verbalize understanding of ways to prevent future complications and PD-related community resources.   Time 4   Period Weeks   Status achieved           OT Long Term Goals - 08/03/16 1239      OT LONG TERM GOAL #1   Title Pt will verbalize understanding of adaptive strategies to incr ease/safety/independence with ADLs/IADL--check LTGs 10/02/16   Time 8   Period Weeks   Status New     OT LONG TERM GOAL #2   Title Pt will improve bilateral functional reaching/coordination as shown by improving score on box and blocks test by at least 4 bilaterally.   Baseline R-43 blocks, L-39 blocks   Time 8   Period Weeks   Status New     OT LONG TERM GOAL #3   Title Pt will improve abilty to dress as shown by improving time on PPT#4 by at least 10sec.   Baseline 61.97sec    Time 8   Period Weeks   Status New     OT LONG TERM GOAL #4   Title Pt will improve LUE functional reaching as shown by demo at least 110* shoulder flex and at least -25* elbow ext.   Baseline 105* shoulder flex, -25* elbow ext   Time 8   Period Weeks   Status New     OT LONG TERM GOAL #5   Title Pt will report bilateral shoulder pain less than or equal to 4/10 for ADLs.   Baseline 7/10   Time 8   Period Weeks   Status New               Plan - 09/09/16 0354    Clinical Impression Statement Pt is progressing towards goals with decreased overall shoulder pain. He  remains limited by bradykinesia and cognitive deficits.   Rehab Potential Fair   OT Frequency 2x / week   OT Duration 8 weeks   OT Treatment/Interventions Self-care/ADL training;Energy conservation;Manual Therapy;Cognitive remediation/compensation;Passive range of motion;Functional Mobility Training;Neuromuscular education;Cryotherapy;Therapeutic exercise;Ultrasound;Therapeutic activities;Therapeutic exercises;Balance  training;Patient/family education;Splinting;DME and/or AE instruction;Moist Heat   Plan finish checking short term goals, review donning / doffing jacket   OT Home Exercise Plan Education provided:  supine PWR! (up, rock, twist), ball ex in supine, handwriting strategies   Consulted and Agree with Plan of Care Patient      Patient will benefit from skilled therapeutic intervention in order to improve the following deficits and impairments:  Decreased coordination, Decreased range of motion, Abnormal gait, Pain, Impaired UE functional use, Decreased knowledge of use of DME, Decreased balance, Decreased cognition, Decreased mobility, Decreased strength, Impaired perceived functional ability, Impaired tone, Decreased activity tolerance, Decreased endurance, Decreased safety awareness, Improper spinal/pelvic alignment, Decreased knowledge of precautions  Visit Diagnosis: Other abnormalities of gait and mobility  Abnormal posture  Unsteadiness on feet  Other symptoms and signs involving the nervous system  Other symptoms and signs involving the musculoskeletal system  Frontal lobe and executive function deficit    Problem List Patient Active Problem List   Diagnosis Date Noted  . COPD, mild ([place]) 04/09/2014  . Unstable angina ([place]) 01/17/2014  . Mitral regurgitation   . GERD (gastroesophageal reflux disease)   . DM (diabetes mellitus) ([place])   . Asymptomatic LV dysfunction   . CAD (coronary artery disease) 01/15/2014  . Abnormal nuclear cardiac imaging test 01/14/2014   . Chronic renal disease, stage III 01/14/2014  . Sinus bradycardia- HR 43 01/14/2014  . Essential hypertension 11/16/2013  . Hyperlipidemia 11/16/2013  . Depressive disorder, not elsewhere classified 05/08/2013    RINE,KATHRYN 09/09/2016, 9:08 AM  [facility] [address] [place], [place], [address] Phone: [phone]   Fax:  [phone]  Name: PONCIANO SHEALY MRN: [id number] Date of Birth: 1928/12/08

## 2016-09-10 ENCOUNTER — Ambulatory Visit: Payer: Medicare Other | Admitting: Physical Therapy

## 2016-09-10 ENCOUNTER — Encounter: Payer: Self-pay | Admitting: Physical Therapy

## 2016-09-10 ENCOUNTER — Ambulatory Visit: Payer: Medicare Other | Admitting: Occupational Therapy

## 2016-09-10 ENCOUNTER — Ambulatory Visit: Payer: Medicare Other

## 2016-09-10 DIAGNOSIS — M25611 Stiffness of right shoulder, not elsewhere classified: Secondary | ICD-10-CM

## 2016-09-10 DIAGNOSIS — R29818 Other symptoms and signs involving the nervous system: Secondary | ICD-10-CM

## 2016-09-10 DIAGNOSIS — R41841 Cognitive communication deficit: Secondary | ICD-10-CM

## 2016-09-10 DIAGNOSIS — R293 Abnormal posture: Secondary | ICD-10-CM

## 2016-09-10 DIAGNOSIS — R2689 Other abnormalities of gait and mobility: Secondary | ICD-10-CM | POA: Diagnosis not present

## 2016-09-10 DIAGNOSIS — R29898 Other symptoms and signs involving the musculoskeletal system: Secondary | ICD-10-CM

## 2016-09-10 DIAGNOSIS — R471 Dysarthria and anarthria: Secondary | ICD-10-CM

## 2016-09-10 DIAGNOSIS — R278 Other lack of coordination: Secondary | ICD-10-CM

## 2016-09-10 DIAGNOSIS — R2681 Unsteadiness on feet: Secondary | ICD-10-CM

## 2016-09-10 DIAGNOSIS — M25612 Stiffness of left shoulder, not elsewhere classified: Secondary | ICD-10-CM

## 2016-09-10 NOTE — Patient Instructions (Signed)
  Please complete the assigned speech therapy homework prior to your next session and return it to the speech therapist at your next visit.  

## 2016-09-10 NOTE — Therapy (Signed)
Ronald Benitez [address] [place], [place], [address] Phone: [phone]   Fax:  [phone]  Physical Therapy Treatment  Patient Details  Name: Ronald Benitez MRN: [id number] Date of Birth: [birth year] Referring Provider: Alonza Bogus  Encounter Date: 09/10/2016      PT End of Session - 09/10/16 1243    Visit Number 9   Number of Visits 17   Date for PT Re-Evaluation 10/02/16   Authorization Type Blue Medicare HMO-GCODE every 10th visit   PT Start Time 1149   PT Stop Time 1228   PT Time Calculation (min) 39 min   Activity Tolerance Patient tolerated treatment well   Behavior During Therapy Flat affect      Past Medical History:  Diagnosis Date  . Asymptomatic LV dysfunction    a. 2D ECHO on 12/04/2013. LV EF: 50% - 55%; G3DD, mild AR, mod MR, mild RA dilation, increased thickness of atrial septum, c/w lipomatous hypertrophy. Small patent foramen ovale. Present only with provocation from having the patient cough. Agitated saline contrast study showed a very small right-to-left atrial level shunt, in the baseline state.  Marland Kitchen CAD (coronary artery disease)    a. 01/16/14 s/p DES x2 to RCA. b remote RCA stenting 1997   . Chronic kidney disease   . Depression   . DM (diabetes mellitus) ([place])   . GERD (gastroesophageal reflux disease)   . HTN (hypertension)   . Hyperlipidemia   . Mitral regurgitation    a. mod by 11/2013 ECHO    Past Surgical History:  Procedure Laterality Date  . ANGIOPLASTY  1997  . ESOPHAGOGASTRODUODENOSCOPY ENDOSCOPY    . LEFT HEART CATHETERIZATION WITH CORONARY ANGIOGRAM N/A 01/15/2014   Procedure: LEFT HEART CATHETERIZATION WITH CORONARY ANGIOGRAM;  Surgeon: Lorretta Harp, MD;  Location: [facility] CATH LAB;  Service: Cardiovascular;  Laterality: N/A;  . PERCUTANEOUS CORONARY ROTOBLATOR INTERVENTION (PCI-R) N/A 01/16/2014   Procedure: PERCUTANEOUS CORONARY ROTOBLATOR INTERVENTION (PCI-R);  Surgeon:  Troy Sine, MD;  Location: [facility] CATH LAB;  Service: Cardiovascular;  Laterality: N/A;    There were no vitals filed for this visit.      Subjective Assessment - 09/10/16 1151    Subjective No falls. Felt he had a near fall during PT session in // bars. Admits he does not do much exercise at home and sits alot due to knee pain.   How long can you walk comfortably? No    Currently in Pain? No/denies   Pain Onset More than a month ago                         [facility] Adult PT Treatment/Exercise - 09/10/16 0001      Bed Mobility   Bed Mobility Rolling Right;Rolling Left;Right Sidelying to Sit;Sit to Sidelying Right   Rolling Right 6: Modified independent (Device/Increase time)   Rolling Left 6: Modified independent (Device/Increase time)   Right Sidelying to Sit 4: Min assist;HOB flat   Right Sidelying to Sit Details (indicate cue type and reason) vc and assist for technique; head rotation to look down at hand and reach left pelvis to mat   Sit to Sidelying Right 5: Supervision     Transfers   Transfers Sit to Stand;Stand to Sit   Sit to Stand 5: Supervision;Without upper extremity assist   Number of Reps 1 set  5, holding ball     Ambulation/Gait   Ambulation/Gait Assistance 4: Min guard  Ambulation/Gait Assistance Details initial 25 ft off balance and staggering (pt thought due to knee pain)   Ambulation Distance (Feet) 120 Feet  120   Assistive device None   Gait Pattern Step-through pattern;Decreased arm swing - left;Decreased step length - right;Decreased step length - left;Lateral trunk lean to right;Trunk flexed;Narrow base of support;Poor foot clearance - left;Poor foot clearance - right   Ambulation Surface Level;Indoor     Lumbar Exercises: Stretches   Active Hamstring Stretch 3 reps;30 seconds  using strap   Single Knee to Chest Stretch 2 reps;30 seconds  AAROM to maintain alignment   Lower Trunk Rotation 2 reps;30 seconds     Lumbar Exercises:  Supine   Bent Knee Raise 10 reps   Bridge 10 reps;5 seconds   Other Supine Lumbar Exercises green band aroung thighs (hooklying) take one knee out to the side; alternating legs x 10                PT Education - 09/10/16 1242    Education provided Yes   Education Details benefits of activity/movement for arthritis; by end of session he felt no knee pain when walking!   Person(s) Educated Patient   Methods Explanation;Demonstration   Comprehension Verbalized understanding;Need further instruction          PT Short Term Goals - 09/02/16 1534      PT SHORT TERM GOAL #1   Title Pt will perform HEP with family supervision, for improved functional mobility, gait and transfers.  TARGET 09/01/16   Baseline Pt reports performing consistently on his own and has no questions about HEP; 09/01/16   Time 4   Period Weeks   Status Achieved     PT SHORT TERM GOAL #2   Title Pt will improve 5x sit<>stand to less than or equal to 20 seconds for improved transfer efficiency and safety.   Baseline 5X sit<>stands 18 sec without UE support; 09/01/16.   Time 4   Period Weeks   Status Achieved     PT SHORT TERM GOAL #3   Title Pt will improve TUG cog score to less than or equal to 15 seconds for decreased fall risk.   Baseline TUG Cognitive 17.9 sec   Time 4   Period Weeks   Status Not Met     PT SHORT TERM GOAL #4   Title Pt will improve Functional Gait Assessment to at least 20/30 for decreased fall risk.   Baseline FGA 20/30; 09/01/16.   Time 4   Period Weeks   Status Achieved           PT Long Term Goals - 08/03/16 1416      PT LONG TERM GOAL #1   Title Pt will verbalize understanding of fall prevention in the home environment.  TARGET 10/02/16   Time 8   Period Weeks   Status New     PT LONG TERM GOAL #2   Title Pt will improve 5x sit<>stand to less than or equal to 17 seconds for improved transfer efficiency and safety.   Time 8   Status New     PT LONG TERM GOAL #3    Title Pt will improve TUG manual score to less than or equal to 14.5 seconds for decreased fall risk.   Time 8   Period Weeks   Status New     PT LONG TERM GOAL #4   Title Pt will improve FGA score to at least 23/30 for decreased fall  risk, improved gait efficiency and safety.   Time 8   Period Weeks   Status New     PT LONG TERM GOAL #5   Title Pt will verbalize plans for continued community fitness upon D/C from PT.   Time 8   Period Weeks   Status New               Plan - 09/10/16 1244    Clinical Impression Statement PT was pt's 3rd therapy session today and he was fatigued on arrival. As began walking, he reported knee pain causing him to be very unsteady. Proceeded to sitting and supine exercises (which incorporated Knee ROM). By end of session was walking much better and denied knee pain. Patient admits he does not do much exercise at home due to knee pain. Currently limited if any progress towards goals due to non-compliance (although pt also with decr cognition). Final PT sessions scheduled for next week.    Rehab Potential Good   PT Frequency 2x / week   PT Duration 8 weeks   PT Treatment/Interventions ADLs/Self Care Home Management;Functional mobility training;Gait training;Therapeutic activities;Therapeutic exercise;Balance training;Neuromuscular re-education;Patient/family education   PT Next Visit Plan G code next visit; 4/12 will be last appt; balance on solid & compliant surface, stepping strategies.   Consulted and Agree with Plan of Care Patient      Patient will benefit from skilled therapeutic intervention in order to improve the following deficits and impairments:  Abnormal gait, Decreased balance, Decreased mobility, Decreased strength, Difficulty walking, Postural dysfunction  Visit Diagnosis: Other abnormalities of gait and mobility  Abnormal posture  Unsteadiness on feet  Other symptoms and signs involving the musculoskeletal  system     Problem List Patient Active Problem List   Diagnosis Date Noted  . COPD, mild ([place]) 04/09/2014  . Unstable angina ([place]) 01/17/2014  . Mitral regurgitation   . GERD (gastroesophageal reflux disease)   . DM (diabetes mellitus) ([place])   . Asymptomatic LV dysfunction   . CAD (coronary artery disease) 01/15/2014  . Abnormal nuclear cardiac imaging test 01/14/2014  . Chronic renal disease, stage III 01/14/2014  . Sinus bradycardia- HR 43 01/14/2014  . Essential hypertension 11/16/2013  . Hyperlipidemia 11/16/2013  . Depressive disorder, not elsewhere classified 05/08/2013    Rexanne Mano, PT 09/10/2016, 12:50 PM  [facility] [address] [place], [place], [address] Phone: [phone]   Fax:  [phone]  Name: ITHAN TOUHEY MRN: [id number] Date of Birth: [birth year]

## 2016-09-10 NOTE — Therapy (Signed)
[facility] [address] [place], [place], [address] Phone: [phone]   Fax:  [phone]  Speech Language Pathology Treatment  Patient Details  Name: Ronald Benitez MRN: [id number] Date of Birth: 1929-04-29 Referring Provider: Dr. Wells Guiles Tat  Encounter Date: 09/10/2016      End of Session - 09/10/16 1549    Visit Number 6   Number of Visits 12   Date for SLP Re-Evaluation 09/10/16   SLP Start Time 1106   SLP Stop Time  1147   SLP Time Calculation (min) 41 min   Activity Tolerance Patient tolerated treatment well      Past Medical History:  Diagnosis Date  . Asymptomatic LV dysfunction    a. 2D ECHO on 12/04/2013. LV EF: 50% - 55%; G3DD, mild AR, mod MR, mild RA dilation, increased thickness of atrial septum, c/w lipomatous hypertrophy. Small patent foramen ovale. Present only with provocation from having the patient cough. Agitated saline contrast study showed a very small right-to-left atrial level shunt, in the baseline state.  Marland Kitchen CAD (coronary artery disease)    a. 01/16/14 s/p DES x2 to RCA. b remote RCA stenting 1997   . Chronic kidney disease   . Depression   . DM (diabetes mellitus) ([place])   . GERD (gastroesophageal reflux disease)   . HTN (hypertension)   . Hyperlipidemia   . Mitral regurgitation    a. mod by 11/2013 ECHO    Past Surgical History:  Procedure Laterality Date  . ANGIOPLASTY  1997  . ESOPHAGOGASTRODUODENOSCOPY ENDOSCOPY    . LEFT HEART CATHETERIZATION WITH CORONARY ANGIOGRAM N/A 01/15/2014   Procedure: LEFT HEART CATHETERIZATION WITH CORONARY ANGIOGRAM;  Surgeon: Lorretta Harp, MD;  Location: [facility] CATH LAB;  Service: Cardiovascular;  Laterality: N/A;  . PERCUTANEOUS CORONARY ROTOBLATOR INTERVENTION (PCI-R) N/A 01/16/2014   Procedure: PERCUTANEOUS CORONARY ROTOBLATOR INTERVENTION (PCI-R);  Surgeon: Troy Sine, MD;  Location: [facility] CATH LAB;  Service: Cardiovascular;  Laterality: N/A;     There were no vitals filed for this visit.      Subjective Assessment - 09/10/16 1546    Subjective "Well, breathing and -- louder. No, not louder - - clearer." (pt, re: strategies for incr'd intelligibliity)   Currently in Pain? No/denies               ADULT SLP TREATMENT - 09/10/16 1118      General Information   Behavior/Cognition Alert;Cooperative;Pleasant mood;Hard of hearing     Treatment Provided   Treatment provided Cognitive-Linquistic     Cognitive-Linquistic Treatment   Treatment focused on Dysarthria   Skilled Treatment Pt responded more exactly, when asked, "Move my mouth more." re: how to make his speech clearer. In sentence completion tasks pt had 95% intelligibility mainly due to using strategies for improving speech clarity.  In conversation between reps pt remained intelligible approx 85-90% of the time, and in conversation outside of therapy tasks, pt initially exhibited no change from prior to therapy. Once SLP brought this to pt's attention he appeared to make a change for incr'd attempts at compensation use.      Assessment / Recommendations / Plan   Plan Continue with current plan of care     Progression Toward Goals   Progression toward goals Progressing toward goals            SLP Short Term Goals - 09/08/16 1525      SLP SHORT TERM GOAL #1   Title Pt will verbalize  process of diaphragmatic breathing with mod cues for accuracy   Status Achieved     SLP SHORT TERM GOAL #2   Title Pt will demonstrate diaphragmatic breathing during 2 therapy sessions   Status Achieved     SLP SHORT TERM GOAL #3   Title Pt will identify and implement compensatory strategy for improved functional recall with mod cues for use outside of therapy   Status Deferred  focus on speech intelligibility          SLP Long Term Goals - 09/10/16 1549      SLP LONG TERM GOAL #1   Title Pt will verbalize the process of diaphragmatic breathing with min cues for  accuracy   Time 3   Period Weeks   Status On-going     SLP LONG TERM GOAL #2   Title Pt will demonstrate diaphragmatic breathing during functional tasks during therapy, and report practice at home   Time 3   Period Weeks   Status On-going     SLP LONG TERM GOAL #3   Title Pt will report use of compensatory strategy for improved functional recall between sessions   Time 3   Period Weeks   Status On-going     SLP LONG TERM GOAL #4   Title in simple to mod complex conversation of 10 minutes, pt will maintain intelligibility of 95% over three sessions   Time 3   Period Weeks   Status New          Plan - 09/10/16 1549    Clinical Impression Statement Pt presented today with WNL loudness, and again with slightly reduced articulatory precision upon entering ST room. His use of abdominal breathing remained good in conversation, even over two week break in ST. Suspect 1-2 more weeks prior to d/c. Skilled ST remains necessary to assist pt in carryover of clearer articulation as well as use of abdominal breathing across speaking situations.   Speech Therapy Frequency 2x / week   Treatment/Interventions Functional tasks;Patient/family education;SLP instruction and feedback   Potential to Achieve Goals Good   Potential Considerations Ability to learn/carryover information      Patient will benefit from skilled therapeutic intervention in order to improve the following deficits and impairments:   Dysarthria  Cognitive communication deficit    Problem List Patient Active Problem List   Diagnosis Date Noted  . COPD, mild ([place]) 04/09/2014  . Unstable angina ([place]) 01/17/2014  . Mitral regurgitation   . GERD (gastroesophageal reflux disease)   . DM (diabetes mellitus) ([place])   . Asymptomatic LV dysfunction   . CAD (coronary artery disease) 01/15/2014  . Abnormal nuclear cardiac imaging test 01/14/2014  . Chronic renal disease, stage III 01/14/2014  . Sinus bradycardia- HR 43  01/14/2014  . Essential hypertension 11/16/2013  . Hyperlipidemia 11/16/2013  . Depressive disorder, not elsewhere classified 05/08/2013    [facility] ,[place], [place]  09/10/2016, 3:50 PM  [facility] [address] [place], [place], [address] Phone: [phone]   Fax:  [phone]   Name: Ronald Benitez MRN: [id number] Date of Birth: 05/30/1929

## 2016-09-10 NOTE — Patient Instructions (Signed)
Coordination Exercises  Perform the following exercises for 20 minutes 1 times per day. Perform with both hand(s). Perform using big movements.   Flipping Cards: Place deck of cards on the table. Flip cards over by opening your hand big to grasp and then turn your palm up big.  Deal cards: Hold 1/2 or whole deck in your hand. Use thumb to push card off top of deck with one big push.  Pick up coins and place in coin bank or container: Pick up with big, intentional movements. Do not drag coin to the edge.  Pick up coins and stack one at a time: Pick up with big, intentional movements. Do not drag coin to the edge. (5-10 in a stack)  Pick up 5-10 coins one at a time and hold in palm. Then, move coins from palm to fingertips one at time and place in coin bank/container. 

## 2016-09-10 NOTE — Therapy (Signed)
[place] [address] [place], [place], [address] Phone: [phone]   Fax:  [phone]  Occupational Therapy Treatment  Patient Details  Name: Ronald Benitez MRN: [id number] Date of Birth: January 11, 1929 Referring Provider: Dr. Wells Guiles Tat  Encounter Date: 09/10/2016      OT End of Session - 09/10/16 1054    Visit Number 8   Number of Visits 17   Date for OT Re-Evaluation 10/02/16   Authorization Type [employer], no visit limit, no auth; G code needed   Authorization Time Period anticipate 6-8 wks needed   Authorization - Visit Number 8   Authorization - Number of Visits 10   OT Start Time 1018   OT Stop Time 1100   OT Time Calculation (min) 42 min   Activity Tolerance Patient tolerated treatment well   Behavior During Therapy [facility] for tasks assessed/performed      Past Medical History:  Diagnosis Date  . Asymptomatic LV dysfunction    a. 2D ECHO on 12/04/2013. LV EF: 50% - 55%; G3DD, mild AR, mod MR, mild RA dilation, increased thickness of atrial septum, c/w lipomatous hypertrophy. Small patent foramen ovale. Present only with provocation from having the patient cough. Agitated saline contrast study showed a very small right-to-left atrial level shunt, in the baseline state.  Marland Kitchen CAD (coronary artery disease)    a. 01/16/14 s/p DES x2 to RCA. b remote RCA stenting 1997   . Chronic kidney disease   . Depression   . DM (diabetes mellitus) ([place])   . GERD (gastroesophageal reflux disease)   . HTN (hypertension)   . Hyperlipidemia   . Mitral regurgitation    a. mod by 11/2013 ECHO    Past Surgical History:  Procedure Laterality Date  . ANGIOPLASTY  1997  . ESOPHAGOGASTRODUODENOSCOPY ENDOSCOPY    . LEFT HEART CATHETERIZATION WITH CORONARY ANGIOGRAM N/A 01/15/2014   Procedure: LEFT HEART CATHETERIZATION WITH CORONARY ANGIOGRAM;  Surgeon: Lorretta Harp, MD;  Location: [facility] CATH LAB;  Service: Cardiovascular;   Laterality: N/A;  . PERCUTANEOUS CORONARY ROTOBLATOR INTERVENTION (PCI-R) N/A 01/16/2014   Procedure: PERCUTANEOUS CORONARY ROTOBLATOR INTERVENTION (PCI-R);  Surgeon: Troy Sine, MD;  Location: [facility] CATH LAB;  Service: Cardiovascular;  Laterality: N/A;    There were no vitals filed for this visit.      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 Yes   Pain Score 3    Pain Location Shoulder   Pain Orientation Right   Pain Type Acute pain   Pain Onset More than a month ago   Pain Frequency Intermittent   Aggravating Factors  overhead reach   Pain Relieving Factors repositioning   Multiple Pain Sites No         Treatment: closed chain shoulder flexion and chest press x 10 reps in supine min v.c., PWR! Up and rock in supine x 10 reps, min v.c. No increased pain. Therapsit reviewed donning/ doffing jacket and finished checking short term goals.                     OT Education - 09/10/16 1548    Education provided Yes   Education Details inital coordination HEP, review of closed chain ball ex in supine  Person(s) Educated Patient   Methods Explanation;Demonstration;Verbal cues;Handout   Comprehension Verbalized understanding;Returned demonstration;Verbal cues required  min-mod v.c. for larger amplitude  movments          OT Short Term Goals - 09/10/16 1041      OT SHORT TERM GOAL #1   Title Pt will be independent with PD-specific HEP--check STGs 09/01/16   Time 4   Period Weeks   Status Achieved     OT SHORT TERM GOAL #2   Title Pt will improve balance and functional reaching for ADLs as shown by improving standing functional reach by at least 2 inches with RUE.   Baseline 8"   Time 4   Period Weeks   Status  On-going  9.5 in     OT SHORT TERM GOAL #3   Title Pt will improve abilty to dress as shown by improving time on PPT#4 by at least 5sec.   Baseline 61.97sec   Time 4   Period Weeks   Status Achieved  39.44 secs     OT SHORT TERM GOAL #4   Title Pt will be able to write numbers/simple phrases with 100% legibility and no significant decr in size in order to be able to continue to complete taxes.   Baseline 90%   Time 4   Period Weeks   Status On-going  90% legible, min decrease in letter size     OT SHORT TERM GOAL #5   Title Pt will verbalize understanding of ways to prevent future complications and PD-related community resources.   Time 4   Period Weeks   Status Achieved           OT Long Term Goals - 08/03/16 1239      OT LONG TERM GOAL #1   Title Pt will verbalize understanding of adaptive strategies to incr ease/safety/independence with ADLs/IADL--check LTGs 10/02/16   Time 8   Period Weeks   Status New     OT LONG TERM GOAL #2   Title Pt will improve bilateral functional reaching/coordination as shown by improving score on box and blocks test by at least 4 bilaterally.   Baseline R-43 blocks, L-39 blocks   Time 8   Period Weeks   Status New     OT LONG TERM GOAL #3   Title Pt will improve abilty to dress as shown by improving time on PPT#4 by at least 10sec.   Baseline 61.97sec    Time 8   Period Weeks   Status New     OT LONG TERM GOAL #4   Title Pt will improve LUE functional reaching as shown by demo at least 110* shoulder flex and at least -25* elbow ext.   Baseline 105* shoulder flex, -25* elbow ext   Time 8   Period Weeks   Status New     OT LONG TERM GOAL #5   Title Pt will report bilateral shoulder pain less than or equal to 4/10 for ADLs.   Baseline 7/10   Time 8   Period Weeks   Status New               Plan - 09/10/16 1053    Clinical Impression Statement Pt is progressing towards goals. He continues to be limited by right  shoulder pain.   Rehab Potential Fair   OT Frequency 2x / week   OT Duration 8 weeks   OT Treatment/Interventions Self-care/ADL training;Energy conservation;Manual Therapy;Cognitive remediation/compensation;Passive range of motion;Functional Mobility Training;Neuromuscular education;Cryotherapy;Therapeutic  exercise;Ultrasound;Therapeutic activities;Therapeutic exercises;Balance training;Patient/family education;Splinting;DME and/or AE instruction;Moist Heat   Plan work towards long term goals   OT Home Exercise Plan Education provided:  supine PWR! (up, rock, twist), ball ex in supine, handwriting strategies   Consulted and Agree with Plan of Care Patient      Patient will benefit from skilled therapeutic intervention in order to improve the following deficits and impairments:  Decreased coordination, Decreased range of motion, Abnormal gait, Pain, Impaired UE functional use, Decreased knowledge of use of DME, Decreased balance, Decreased cognition, Decreased mobility, Decreased strength, Impaired perceived functional ability, Impaired tone, Decreased activity tolerance, Decreased endurance, Decreased safety awareness, Improper spinal/pelvic alignment, Decreased knowledge of precautions  Visit Diagnosis: Stiffness of right shoulder, not elsewhere classified  Abnormal posture  Other symptoms and signs involving the nervous system  Other symptoms and signs involving the musculoskeletal system  Other lack of coordination  Stiffness of left shoulder, not elsewhere classified    Problem List Patient Active Problem List   Diagnosis Date Noted  . COPD, mild ([place]) 04/09/2014  . Unstable angina ([place]) 01/17/2014  . Mitral regurgitation   . GERD (gastroesophageal reflux disease)   . DM (diabetes mellitus) ([place])   . Asymptomatic LV dysfunction   . CAD (coronary artery disease) 01/15/2014  . Abnormal nuclear cardiac imaging test 01/14/2014  . Chronic renal disease, stage III 01/14/2014  .  Sinus bradycardia- HR 43 01/14/2014  . Essential hypertension 11/16/2013  . Hyperlipidemia 11/16/2013  . Depressive disorder, not elsewhere classified 05/08/2013    RINE,KATHRYN 09/10/2016, 3:50 PM  [place] [address] [place], [place], [address] Phone: [phone]   Fax:  [phone]  Name: HARSHIL CAVALLARO MRN: [id number] Date of Birth: 25-May-1929

## 2016-09-14 ENCOUNTER — Ambulatory Visit: Payer: Medicare Other | Admitting: Speech Pathology

## 2016-09-14 ENCOUNTER — Ambulatory Visit: Payer: Medicare Other | Admitting: Occupational Therapy

## 2016-09-14 ENCOUNTER — Ambulatory Visit: Payer: Medicare Other | Admitting: Physical Therapy

## 2016-09-14 ENCOUNTER — Encounter: Payer: Self-pay | Admitting: Physical Therapy

## 2016-09-14 DIAGNOSIS — R2689 Other abnormalities of gait and mobility: Secondary | ICD-10-CM

## 2016-09-14 DIAGNOSIS — R41844 Frontal lobe and executive function deficit: Secondary | ICD-10-CM

## 2016-09-14 DIAGNOSIS — R293 Abnormal posture: Secondary | ICD-10-CM

## 2016-09-14 DIAGNOSIS — R29818 Other symptoms and signs involving the nervous system: Secondary | ICD-10-CM

## 2016-09-14 DIAGNOSIS — M25611 Stiffness of right shoulder, not elsewhere classified: Secondary | ICD-10-CM

## 2016-09-14 DIAGNOSIS — R278 Other lack of coordination: Secondary | ICD-10-CM

## 2016-09-14 DIAGNOSIS — R29898 Other symptoms and signs involving the musculoskeletal system: Secondary | ICD-10-CM

## 2016-09-14 DIAGNOSIS — M25612 Stiffness of left shoulder, not elsewhere classified: Secondary | ICD-10-CM

## 2016-09-14 DIAGNOSIS — R2681 Unsteadiness on feet: Secondary | ICD-10-CM

## 2016-09-14 DIAGNOSIS — R471 Dysarthria and anarthria: Secondary | ICD-10-CM

## 2016-09-14 NOTE — Therapy (Signed)
[facility] [address] [place], [place], [address] Phone: [phone]   Fax:  [phone]  Speech Language Pathology Treatment  Patient Details  Name: Ronald Benitez MRN: [id number] Date of Birth: [birth year] Referring Provider: Dr. Wells Guiles Tat  Encounter Date: 09/14/2016      End of Session - 09/14/16 1217    Visit Number 7   Number of Visits 12   SLP Start Time 7494   SLP Stop Time  1100   SLP Time Calculation (min) 44 min   Activity Tolerance Patient tolerated treatment well      Past Medical History:  Diagnosis Date  . Asymptomatic LV dysfunction    a. 2D ECHO on 12/04/2013. LV EF: 50% - 55%; G3DD, mild AR, mod MR, mild RA dilation, increased thickness of atrial septum, c/w lipomatous hypertrophy. Small patent foramen ovale. Present only with provocation from having the patient cough. Agitated saline contrast study showed a very small right-to-left atrial level shunt, in the baseline state.  Marland Kitchen CAD (coronary artery disease)    a. 01/16/14 s/p DES x2 to RCA. b remote RCA stenting 1997   . Chronic kidney disease   . Depression   . DM (diabetes mellitus) ([place])   . GERD (gastroesophageal reflux disease)   . HTN (hypertension)   . Hyperlipidemia   . Mitral regurgitation    a. mod by 11/2013 ECHO    Past Surgical History:  Procedure Laterality Date  . ANGIOPLASTY  1997  . ESOPHAGOGASTRODUODENOSCOPY ENDOSCOPY    . LEFT HEART CATHETERIZATION WITH CORONARY ANGIOGRAM N/A 01/15/2014   Procedure: LEFT HEART CATHETERIZATION WITH CORONARY ANGIOGRAM;  Surgeon: Lorretta Harp, MD;  Location: [facility] CATH LAB;  Service: Cardiovascular;  Laterality: N/A;  . PERCUTANEOUS CORONARY ROTOBLATOR INTERVENTION (PCI-R) N/A 01/16/2014   Procedure: PERCUTANEOUS CORONARY ROTOBLATOR INTERVENTION (PCI-R);  Surgeon: Troy Sine, MD;  Location: [facility] CATH LAB;  Service: Cardiovascular;  Laterality: N/A;    There were no vitals filed for  this visit.      Subjective Assessment - 09/14/16 1024    Subjective "She can hear me" re: wife               ADULT SLP TREATMENT - 09/14/16 1024      General Information   Behavior/Cognition Alert;Cooperative;Pleasant mood;Hard of hearing     Treatment Provided   Treatment provided Cognitive-Linquistic     Pain Assessment   Pain Assessment No/denies pain     Cognitive-Linquistic Treatment   Treatment focused on Dysarthria   Skilled Treatment Pt verbalized compensations for dysarthria with rare min A. Structured speech tasks with overarticulation, breath support with 95% intellgibility and occasional min verbal cues for specific strategies. Moderately complex conversation in mildly noisy environment with 95% intellgibility.      Assessment / Recommendations / Plan   Plan Continue with current plan of care     Progression Toward Goals   Progression toward goals Progressing toward goals          SLP Education - 09/14/16 1102    Education Details continue daily breathing and practice compensations for dysarthria daily upon d/c.   Person(s) Educated Patient   Methods Explanation;Demonstration;Verbal cues;Handout   Comprehension Verbalized understanding          SLP Short Term Goals - 09/14/16 1216      SLP SHORT TERM GOAL #1   Title Pt will verbalize process of diaphragmatic breathing with mod cues for accuracy   Status Achieved  SLP SHORT TERM GOAL #2   Title Pt will demonstrate diaphragmatic breathing during 2 therapy sessions   Status Achieved     SLP SHORT TERM GOAL #3   Title Pt will identify and implement compensatory strategy for improved functional recall with mod cues for use outside of therapy   Status Deferred  focus on speech intelligibility          SLP Long Term Goals - 09/14/16 1216      SLP LONG TERM GOAL #1   Title Pt will verbalize the process of diaphragmatic breathing with min cues for accuracy   Time 2   Period Weeks    Status Achieved     SLP LONG TERM GOAL #2   Title Pt will demonstrate diaphragmatic breathing during functional tasks during therapy, and report practice at home   Time 2   Period Weeks   Status On-going     SLP LONG TERM GOAL #3   Title Pt will report use of compensatory strategy for improved functional recall between sessions   Time 2   Period Weeks   Status On-going     SLP LONG TERM GOAL #4   Title in simple to mod complex conversation of 10 minutes, pt will maintain intelligibility of 95% over three sessions   Baseline 09/14/16;    Time 2   Period Weeks   Status On-going          Plan - 09/14/16 1215    Clinical Impression Statement Loudness WNL, abdominal breathing with rare min A. Pt utilized compensations for dysarthria in mildly complex conversation and mild noise with 95% intellgibilty. Continue skilled ST 1 more session to maximize carryover of intellgibiltiy.   Speech Therapy Frequency 2x / week   Treatment/Interventions Functional tasks;Patient/family education;SLP instruction and feedback   Potential to Achieve Goals Good   Potential Considerations Ability to learn/carryover information   SLP Home Exercise Plan diaphragmatic breathing exercises   Consulted and Agree with Plan of Care Patient      Patient will benefit from skilled therapeutic intervention in order to improve the following deficits and impairments:   Dysarthria    Problem List Patient Active Problem List   Diagnosis Date Noted  . COPD, mild ([place]) 04/09/2014  . Unstable angina ([place]) 01/17/2014  . Mitral regurgitation   . GERD (gastroesophageal reflux disease)   . DM (diabetes mellitus) ([place])   . Asymptomatic LV dysfunction   . CAD (coronary artery disease) 01/15/2014  . Abnormal nuclear cardiac imaging test 01/14/2014  . Chronic renal disease, stage III 01/14/2014  . Sinus bradycardia- HR 43 01/14/2014  . Essential hypertension 11/16/2013  . Hyperlipidemia 11/16/2013  . Depressive  disorder, not elsewhere classified 05/08/2013    Lovvorn, Annye Rusk MS, CCC-SLP 09/14/2016, 12:18 PM  [facility] [address] [place], [place], [address] Phone: [phone]   Fax:  [phone]   Name: JAMEL HOLZMANN MRN: [id number] Date of Birth: July 22, 1928

## 2016-09-14 NOTE — Therapy (Signed)
[facility] [address] [place], [place], [address] Phone: [phone]   Fax:  [phone]  Occupational Therapy Treatment  Patient Details  Name: Ronald Benitez MRN: [id number] Date of Birth: 07/19/1928 Referring Provider: Dr. Wells Guiles Tat  Encounter Date: 09/14/2016      OT End of Session - 09/14/16 1138    Visit Number 9   Number of Visits 17   Date for OT Re-Evaluation 10/02/16   Authorization Type Blue Medicare, no visit limit, no auth; G code needed   Authorization Time Period anticipate 6-8 wks needed   Authorization - Visit Number 9   Authorization - Number of Visits 10   OT Start Time 1106   OT Stop Time 1145   OT Time Calculation (min) 39 min   Activity Tolerance Patient tolerated treatment well   Behavior During Therapy [facility] for tasks assessed/performed      Past Medical History:  Diagnosis Date  . Asymptomatic LV dysfunction    a. 2D ECHO on 12/04/2013. LV EF: 50% - 55%; G3DD, mild AR, mod MR, mild RA dilation, increased thickness of atrial septum, c/w lipomatous hypertrophy. Small patent foramen ovale. Present only with provocation from having the patient cough. Agitated saline contrast study showed a very small right-to-left atrial level shunt, in the baseline state.  Marland Kitchen CAD (coronary artery disease)    a. 01/16/14 s/p DES x2 to RCA. b remote RCA stenting 1997   . Chronic kidney disease   . Depression   . DM (diabetes mellitus) ([place])   . GERD (gastroesophageal reflux disease)   . HTN (hypertension)   . Hyperlipidemia   . Mitral regurgitation    a. mod by 11/2013 ECHO    Past Surgical History:  Procedure Laterality Date  . ANGIOPLASTY  1997  . ESOPHAGOGASTRODUODENOSCOPY ENDOSCOPY    . LEFT HEART CATHETERIZATION WITH CORONARY ANGIOGRAM N/A 01/15/2014   Procedure: LEFT HEART CATHETERIZATION WITH CORONARY ANGIOGRAM;  Surgeon: Lorretta Harp, MD;  Location: [facility] CATH LAB;  Service: Cardiovascular;   Laterality: N/A;  . PERCUTANEOUS CORONARY ROTOBLATOR INTERVENTION (PCI-R) N/A 01/16/2014   Procedure: PERCUTANEOUS CORONARY ROTOBLATOR INTERVENTION (PCI-R);  Surgeon: Troy Sine, MD;  Location: [facility] CATH LAB;  Service: Cardiovascular;  Laterality: N/A;    There were no vitals filed for this visit.      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 No/denies   Pain Onset More than a month ago         In standing, functional reaching in diagonal pattern incorporating trunk rotation/wt. shift and PWR! Reach/hands to manipulate and place clothespins on vertical pole with min cueing (with each UE).  Began checking goals and discussing progress and importance of performing HEP regularly at home.--see goals section  Reviewed PWR! Up in supine and ball ex in supine (chest press and shoulder flex), min v.c. For incr movement amplitude.  Arm bike x56mn level 1 for reciprocal movement with cues/target of at least 40rpms for intensity while maintaining movement amplitude/reciprocal movement.   Pt maintained approx 40rpms.  Writing sentences and phone numbers with approx 90% legibility and min  decr in size.  Min cues for review/use of strategies.             OT Short Term Goals - 09/14/16 1150      OT SHORT TERM GOAL #1   Title Pt will be independent with PD-specific HEP--check STGs 09/01/16   Time 4   Period Weeks   Status Achieved     OT SHORT TERM GOAL #2   Title Pt will improve balance and functional reaching for ADLs as shown by improving standing functional reach by at least 2 inches with RUE.   Baseline 8"    Time 4   Period Weeks   Status On-going  9.5 in     OT SHORT TERM GOAL #3   Title Pt will improve abilty to dress as shown by improving time on PPT#4 by at least 5sec.   Baseline 61.97sec   Time 4   Period Weeks   Status Achieved  39.44 secs     OT SHORT TERM GOAL #4   Title Pt will be able to write numbers/simple phrases with 100% legibility and no significant decr in size in order to be able to continue to complete taxes.   Baseline 90%   Time 4   Period Weeks   Status Partially Met  09/14/16:  90% legible, min decrease in letter size     OT SHORT TERM GOAL #5   Title Pt will verbalize understanding of ways to prevent future complications and PD-related community resources.   Time 4   Period Weeks   Status Achieved           OT Long Term Goals - 09/14/16 1110      OT LONG TERM GOAL #1   Title Pt will verbalize understanding of adaptive strategies to incr ease/safety/independence with ADLs/IADL--check LTGs 10/02/16   Time 8   Period Weeks   Status On-going  09/14/16:  writing, opening containers, donning/doffing jacket, keeping feet apart for ADLs     OT LONG TERM GOAL #2   Title Pt will improve bilateral functional reaching/coordination as shown by improving score on box and blocks test by at least 4 bilaterally.   Baseline R-43 blocks, L-39 blocks   Time 8   Period Weeks   Status Not Met  09/14/16:  R-43, L-38 blocks     OT LONG TERM GOAL #3   Title Pt will improve abilty to dress as shown by improving time on PPT#4 by at least 10sec.   Baseline 61.97sec    Time 8   Period Weeks   Status Achieved  39.44sec     OT LONG TERM GOAL #4   Title Pt will improve LUE functional reaching as shown by demo at least 110* shoulder flex and at least -25* elbow ext.   Baseline 105* shoulder flex, -25* elbow ext   Time 8   Period Weeks   Status Achieved  09/14/16:  115* with -25*,      OT LONG TERM GOAL #5   Title Pt will report bilateral shoulder pain less than or  equal to 4/10 for ADLs.   Baseline 7/10   Time 8   Period Weeks   Status Achieved  09/14/16  per pt report (at or below this level)               Plan - 09/14/16 1141    Clinical Impression Statement Pt is progressing towards goals with improved shoulder pain.  However, cognitive deficits remain a  limiting factor and contributes to decr carryover.  Pt also inconsistent with HEP.   Rehab Potential Fair   Clinical Impairments Affecting Rehab Potential cognitive deficits   OT Frequency 2x / week   OT Duration 8 weeks   OT Treatment/Interventions Self-care/ADL training;Energy conservation;Manual Therapy;Cognitive remediation/compensation;Passive range of motion;Functional Mobility Training;Neuromuscular education;Cryotherapy;Therapeutic exercise;Ultrasound;Therapeutic activities;Therapeutic exercises;Balance training;Patient/family education;Splinting;DME and/or AE instruction;Moist Heat   Plan check remaining goals and anticipate d/c; recommend scheduling eval in approx 6 months   OT Home Exercise Plan Education provided:  supine PWR! (up, rock, twist), ball ex in supine, handwriting strategies   Consulted and Agree with Plan of Care Patient      Patient will benefit from skilled therapeutic intervention in order to improve the following deficits and impairments:  Decreased coordination, Decreased range of motion, Abnormal gait, Pain, Impaired UE functional use, Decreased knowledge of use of DME, Decreased balance, Decreased cognition, Decreased mobility, Decreased strength, Impaired perceived functional ability, Impaired tone, Decreased activity tolerance, Decreased endurance, Decreased safety awareness, Improper spinal/pelvic alignment, Decreased knowledge of precautions  Visit Diagnosis: Other symptoms and signs involving the nervous system  Other symptoms and signs involving the musculoskeletal system  Other lack of coordination  Stiffness of left shoulder, not elsewhere  classified  Stiffness of right shoulder, not elsewhere classified  Other abnormalities of gait and mobility  Unsteadiness on feet  Frontal lobe and executive function deficit    Problem List Patient Active Problem List   Diagnosis Date Noted  . COPD, mild ([place]) 04/09/2014  . Unstable angina ([place]) 01/17/2014  . Mitral regurgitation   . GERD (gastroesophageal reflux disease)   . DM (diabetes mellitus) ([place])   . Asymptomatic LV dysfunction   . CAD (coronary artery disease) 01/15/2014  . Abnormal nuclear cardiac imaging test 01/14/2014  . Chronic renal disease, stage III 01/14/2014  . Sinus bradycardia- HR 43 01/14/2014  . Essential hypertension 11/16/2013  . Hyperlipidemia 11/16/2013  . Depressive disorder, not elsewhere classified 05/08/2013    [facility] 09/14/2016, 12:02 PM  [facility] [address] [place], [place], [address] Phone: [phone]   Fax:  [phone]  Name: KYLAR LEONHARDT MRN: [id number] Date of Birth: 05/04/1929   [name], OTR/L [facility] [address] [place], [place]  [address] [phone] phone [phone] 09/14/16 12:20 PM

## 2016-09-14 NOTE — Patient Instructions (Signed)
  Continue practicing breathing   Be aware of when people can't hear you - they lean in or ask you to repeat - Take a big breath and open your mouth to talk big  Continue with practicing over ennunciation with the exercises we gave you  Practice 10 minutes 5 days a week

## 2016-09-14 NOTE — Therapy (Signed)
[facility] [address] [place], [place], [address] Phone: [phone]   Fax:  [phone]  Physical Therapy Treatment  Patient Details  Name: Ronald Benitez MRN: [id number] Date of Birth: [birth year] Referring Provider: Kerin Salen  Encounter Date: 09/14/2016      PT End of Session - 09/14/16 1154    Visit Number 10   Number of Visits 17   Date for PT Re-Evaluation 10/02/16   Authorization Type Blue [employer] HMO-GCODE every 10th visit   PT Start Time 1148   PT Stop Time 1230   PT Time Calculation (min) 42 min   Activity Tolerance Patient tolerated treatment well   Behavior During Therapy Flat affect      Past Medical History:  Diagnosis Date  . Asymptomatic LV dysfunction    a. 2D ECHO on 12/04/2013. LV EF: 50% - 55%; G3DD, mild AR, mod MR, mild RA dilation, increased thickness of atrial septum, c/w lipomatous hypertrophy. Small patent foramen ovale. Present only with provocation from having the patient cough. Agitated saline contrast study showed a very small right-to-left atrial level shunt, in the baseline state.  Marland Kitchen CAD (coronary artery disease)    a. 01/16/14 s/p DES x2 to RCA. b remote RCA stenting 1997   . Chronic kidney disease   . Depression   . DM (diabetes mellitus) (HCC)   . GERD (gastroesophageal reflux disease)   . HTN (hypertension)   . Hyperlipidemia   . Mitral regurgitation    a. mod by 11/2013 ECHO    Past Surgical History:  Procedure Laterality Date  . ANGIOPLASTY  1997  . ESOPHAGOGASTRODUODENOSCOPY ENDOSCOPY    . LEFT HEART CATHETERIZATION WITH CORONARY ANGIOGRAM N/A 01/15/2014   Procedure: LEFT HEART CATHETERIZATION WITH CORONARY ANGIOGRAM;  Surgeon: Runell Gess, MD;  Location: [facility] CATH LAB;  Service: Cardiovascular;  Laterality: N/A;  . PERCUTANEOUS CORONARY ROTOBLATOR INTERVENTION (PCI-R) N/A 01/16/2014   Procedure: PERCUTANEOUS CORONARY ROTOBLATOR INTERVENTION (PCI-R);   Surgeon: Lennette Bihari, MD;  Location: [facility] CATH LAB;  Service: Cardiovascular;  Laterality: N/A;    There were no vitals filed for this visit.      Subjective Assessment - 09/14/16 1151    Subjective Denies falling since last visit. Reports does a few exercises when knees not too bad.   How long can you walk comfortably? No    Patient Stated Goals Pt's goal for therapy is to be able to move easier and better.   Currently in Pain? No/denies   Pain Onset More than a month ago                         [facility] Adult PT Treatment/Exercise - 09/14/16 0001      Bed Mobility   Bed Mobility Right Sidelying to Sit;Sit to Sidelying Right   Right Sidelying to Sit 6: Modified independent (Device/Increase time)   Sit to Sidelying Right 6: Modified independent (Device/Increase time)     Transfers   Transfers Sit to Stand;Stand to Sit   Sit to Stand 5: Supervision;Without upper extremity assist   Sit to Stand Details (indicate cue type and reason) vc for scoot to edge   Stand to Sit 5: Supervision;Without upper extremity assist   Number of Reps 10 reps;1 set     Ambulation/Gait   Ambulation/Gait Assistance 4: Min guard   Ambulation/Gait Assistance Details vc for upright posture, arm swing and incr step length   Ambulation Distance (Feet) 240  Feet  100   Assistive device None   Gait Pattern Step-through pattern;Decreased arm swing - left;Decreased step length - right;Decreased step length - left;Lateral trunk lean to right;Trunk flexed;Narrow base of support;Poor foot clearance - left;Poor foot clearance - right   Ambulation Surface Level;Indoor   Gait velocity 32.8 ft/10.78sec =3.04 ft/sec     Posture/Postural Control   Posture/Postural Control Postural limitations   Postural Limitations Rounded Shoulders;Forward head;Posterior pelvic tilt;Weight shift right   Posture Comments worked on posture during gait; standing at the mirror; lying down; sitting              Balance Exercises - 09/14/16 2230      Balance Exercises: Standing   Balance Beam blue beam parallel in // bars, light advancing to no UE support, EO     OTAGO PROGRAM   Neck Movements Standing;Sitting;5 reps   Back Extension Standing;5 reps   Trunk Movements Standing;5 reps   Hip ABductor 10 reps   Ankle Plantorflexors 20 reps, support   Ankle Dorsiflexors 20 reps, support   Knee Bends 10 reps, support   Backwards Walking Support           PT Education - 09/14/16 1156    Education provided Yes   Education Details Importance of exercise in managing many PD symptoms and need for plan for follow-up exercise once discharged from PT.   Person(s) Educated Patient;Spouse   Methods Explanation   Comprehension Verbalized understanding          PT Short Term Goals - 09/02/16 1534      PT SHORT TERM GOAL #1   Title Pt will perform HEP with family supervision, for improved functional mobility, gait and transfers.  TARGET 09/01/16   Baseline Pt reports performing consistently on his own and has no questions about HEP; 09/01/16   Time 4   Period Weeks   Status Achieved     PT SHORT TERM GOAL #2   Title Pt will improve 5x sit<>stand to less than or equal to 20 seconds for improved transfer efficiency and safety.   Baseline 5X sit<>stands 18 sec without UE support; 09/01/16.   Time 4   Period Weeks   Status Achieved     PT SHORT TERM GOAL #3   Title Pt will improve TUG cog score to less than or equal to 15 seconds for decreased fall risk.   Baseline TUG Cognitive 17.9 sec   Time 4   Period Weeks   Status Not Met     PT SHORT TERM GOAL #4   Title Pt will improve Functional Gait Assessment to at least 20/30 for decreased fall risk.   Baseline FGA 20/30; 09/01/16.   Time 4   Period Weeks   Status Achieved           PT Long Term Goals - 08/03/16 1416      PT LONG TERM GOAL #1   Title Pt will verbalize understanding of fall prevention in the home environment.  TARGET  10/02/16   Time 8   Period Weeks   Status New     PT LONG TERM GOAL #2   Title Pt will improve 5x sit<>stand to less than or equal to 17 seconds for improved transfer efficiency and safety.   Time 8   Status New     PT LONG TERM GOAL #3   Title Pt will improve TUG manual score to less than or equal to 14.5 seconds for decreased fall risk.  Time 8   Period Weeks   Status New     PT LONG TERM GOAL #4   Title Pt will improve FGA score to at least 23/30 for decreased fall risk, improved gait efficiency and safety.   Time 8   Period Weeks   Status New     PT LONG TERM GOAL #5   Title Pt will verbalize plans for continued community fitness upon D/C from PT.   Time 8   Period Weeks   Status New               Plan - 2016/10/06 1155    Clinical Impression Statement Session focused on balance and recovering from loss of balance episodes, posture, gait, and HEP (including discussion with pt and wife re: importance of continuing exercise after d/c from PT).    Rehab Potential Good   PT Frequency 2x / week   PT Duration 8 weeks   PT Treatment/Interventions ADLs/Self Care Home Management;Functional mobility training;Gait training;Therapeutic activities;Therapeutic exercise;Balance training;Neuromuscular re-education;Patient/family education   PT Next Visit Plan 4/12 will be last appt; check LTGs   Consulted and Agree with Plan of Care Patient      Patient will benefit from skilled therapeutic intervention in order to improve the following deficits and impairments:  Abnormal gait, Decreased balance, Decreased mobility, Decreased strength, Difficulty walking, Postural dysfunction  Visit Diagnosis: Abnormal posture  Other symptoms and signs involving the nervous system  Other symptoms and signs involving the musculoskeletal system  Other abnormalities of gait and mobility       G-Codes - 10/06/16 1153    Functional Assessment Tool Used (Outpatient Only) gait velocity 3.04  ft/sec (previously 2.73 ft/sec)   Functional Limitation Mobility: Walking and moving around   Mobility: Walking and Moving Around Current Status [phone]) At least 20 percent but less than 40 percent impaired, limited or restricted   Mobility: Walking and Moving Around Goal Status [phone]) At least 20 percent but less than 40 percent impaired, limited or restricted      Problem List Patient Active Problem List   Diagnosis Date Noted  . COPD, mild ([place]) 04/09/2014  . Unstable angina ([place]) 01/17/2014  . Mitral regurgitation   . GERD (gastroesophageal reflux disease)   . DM (diabetes mellitus) ([place])   . Asymptomatic LV dysfunction   . CAD (coronary artery disease) 01/15/2014  . Abnormal nuclear cardiac imaging test 01/14/2014  . Chronic renal disease, stage III 01/14/2014  . Sinus bradycardia- HR 43 01/14/2014  . Essential hypertension 11/16/2013  . Hyperlipidemia 11/16/2013  . Depressive disorder, not elsewhere classified 05/08/2013   Physical Therapy Progress Note  Dates of Reporting Period: 08/03/16 to 10-06-16   Objective Measurements: Gait velocity 3.04 ft/sec  Goal Update: NA  Plan: Discharge planned for 09/16/16  Reason Skilled Services are Required: Patient will be discharged after final reassessment complete to evaluate effectiveness of course of PT    Rexanne Mano, PT 10/06/16, 10:39 PM  [facility] [address] [place], [place], [address] Phone: [phone]   Fax:  [phone]  Name: SHAHAN STARKS MRN: [id number] Date of Birth: [birth year]

## 2016-09-16 ENCOUNTER — Encounter: Payer: Self-pay | Admitting: Physical Therapy

## 2016-09-16 ENCOUNTER — Ambulatory Visit: Payer: Medicare Other | Admitting: Speech Pathology

## 2016-09-16 ENCOUNTER — Ambulatory Visit: Payer: Medicare Other | Admitting: Physical Therapy

## 2016-09-16 ENCOUNTER — Ambulatory Visit: Payer: Medicare Other | Admitting: Occupational Therapy

## 2016-09-16 DIAGNOSIS — R2681 Unsteadiness on feet: Secondary | ICD-10-CM

## 2016-09-16 DIAGNOSIS — M25611 Stiffness of right shoulder, not elsewhere classified: Secondary | ICD-10-CM

## 2016-09-16 DIAGNOSIS — R29898 Other symptoms and signs involving the musculoskeletal system: Secondary | ICD-10-CM

## 2016-09-16 DIAGNOSIS — R2689 Other abnormalities of gait and mobility: Secondary | ICD-10-CM

## 2016-09-16 DIAGNOSIS — R29818 Other symptoms and signs involving the nervous system: Secondary | ICD-10-CM

## 2016-09-16 DIAGNOSIS — R278 Other lack of coordination: Secondary | ICD-10-CM

## 2016-09-16 DIAGNOSIS — R41844 Frontal lobe and executive function deficit: Secondary | ICD-10-CM

## 2016-09-16 DIAGNOSIS — R293 Abnormal posture: Secondary | ICD-10-CM

## 2016-09-16 DIAGNOSIS — M25612 Stiffness of left shoulder, not elsewhere classified: Secondary | ICD-10-CM

## 2016-09-16 DIAGNOSIS — R471 Dysarthria and anarthria: Secondary | ICD-10-CM

## 2016-09-16 NOTE — Therapy (Signed)
[facility] [address] [place], [place], [address] Phone: [phone]   Fax:  [phone]  Occupational Therapy Treatment  Patient Details  Name: Ronald Benitez MRN: [id number] Date of Birth: 08/22/1928 Referring Provider: Dr. Wells Guiles Tat  Encounter Date: 09/16/2016      OT End of Session - 09/16/16 0950    Visit Number 10   Number of Visits 17   Date for OT Re-Evaluation 10/02/16   Authorization Type Blue Medicare, no visit limit, no auth; G code needed   Authorization Time Period anticipate 6-8 wks needed   Authorization - Visit Number 10   Authorization - Number of Visits 10   OT Start Time [phone]   OT Stop Time 1015   OT Time Calculation (min) 41 min   Activity Tolerance Patient tolerated treatment well   Behavior During Therapy [facility] for tasks assessed/performed;Flat affect      Past Medical History:  Diagnosis Date  . Asymptomatic LV dysfunction    a. 2D ECHO on 12/04/2013. LV EF: 50% - 55%; G3DD, mild AR, mod MR, mild RA dilation, increased thickness of atrial septum, c/w lipomatous hypertrophy. Small patent foramen ovale. Present only with provocation from having the patient cough. Agitated saline contrast study showed a very small right-to-left atrial level shunt, in the baseline state.  Marland Kitchen CAD (coronary artery disease)    a. 01/16/14 s/p DES x2 to RCA. b remote RCA stenting 1997   . Chronic kidney disease   . Depression   . DM (diabetes mellitus) ([place])   . GERD (gastroesophageal reflux disease)   . HTN (hypertension)   . Hyperlipidemia   . Mitral regurgitation    a. mod by 11/2013 ECHO    Past Surgical History:  Procedure Laterality Date  . ANGIOPLASTY  1997  . ESOPHAGOGASTRODUODENOSCOPY ENDOSCOPY    . LEFT HEART CATHETERIZATION WITH CORONARY ANGIOGRAM N/A 01/15/2014   Procedure: LEFT HEART CATHETERIZATION WITH CORONARY ANGIOGRAM;  Surgeon: Lorretta Harp, MD;  Location: [facility] CATH LAB;  Service:  Cardiovascular;  Laterality: N/A;  . PERCUTANEOUS CORONARY ROTOBLATOR INTERVENTION (PCI-R) N/A 01/16/2014   Procedure: PERCUTANEOUS CORONARY ROTOBLATOR INTERVENTION (PCI-R);  Surgeon: Troy Sine, MD;  Location: [facility] CATH LAB;  Service: Cardiovascular;  Laterality: N/A;    There were no vitals filed for this visit.      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 No/denies   Pain Onset More than a month ago        Arm bike x70mn level 1 for reciprocal movement with cues/target of at least 40rpms for intensity while maintaining movement amplitude/reciprocal movement.   Pt maintained 37-45rpms.  In standing, functional reaching in diagonal pattern incorporating trunk rotation/wt. shift and PWR! Reach/hands to manipulate large pegs and place in vertical pegboard with min cueing (with each UE), 1 LOB but pt able to recover without assist.  Checked remaining goals and discussed progress and importance of performing HEP regularly at home--see goals section.  Recommended OT evaluation in approx 6 months and pt verbalized agreement.   Reviewed keeping feet apart for standing activities and sit>stand technique for ADLs.  Pt verbalized understanding and returned demo.  OT Education - 09/16/16 0949    Education Details Reviewed all OT HEPs (supine shoulder and coordination HEP)   Person(s) Educated Patient   Methods Explanation;Demonstration;Verbal cues   Comprehension Verbalized understanding;Returned demonstration;Verbal cues required  min cues for improved technique/movement amplitude          OT Short Term Goals - 09/16/16 0946       OT SHORT TERM GOAL #1   Title Pt will be independent with PD-specific HEP--check STGs 09/01/16   Time 4   Period Weeks   Status Achieved     OT SHORT TERM GOAL #2   Title Pt will improve balance and functional reaching for ADLs as shown by improving standing functional reach by at least 2 inches with RUE.   Baseline 8"   Time 4   Period Weeks   Status Partially Met  9.5 in;  09/16/16:  partially met L-10", R-9.5"     OT SHORT TERM GOAL #3   Title Pt will improve abilty to dress as shown by improving time on PPT#4 by at least 5sec.   Baseline 61.97sec   Time 4   Period Weeks   Status Achieved  39.44 secs     OT SHORT TERM GOAL #4   Title Pt will be able to write numbers/simple phrases with 100% legibility and no significant decr in size in order to be able to continue to complete taxes.   Baseline 90%   Time 4   Period Weeks   Status Partially Met  09/14/16:  90% legible, min decrease in letter size     OT SHORT TERM GOAL #5   Title Pt will verbalize understanding of ways to prevent future complications and PD-related community resources.   Time 4   Period Weeks   Status Achieved           OT Long Term Goals - 09/16/16 0947      OT LONG TERM GOAL #1   Title Pt will verbalize understanding of adaptive strategies to incr ease/safety/independence with ADLs/IADL--check LTGs 10/02/16   Time 8   Period Weeks   Status Achieved  writing, opening containers, donning/doffing jacket, keeping feet apart for ADLs.  09/16/16 met     OT LONG TERM GOAL #2   Title Pt will improve bilateral functional reaching/coordination as shown by improving score on box and blocks test by at least 4 bilaterally.   Baseline R-43 blocks, L-39 blocks   Time 8   Period Weeks   Status Not Met  09/14/16:  R-43, L-38 blocks     OT LONG TERM GOAL #3   Title Pt will improve abilty to dress as shown by improving time on PPT#4 by at least 10sec.   Baseline 61.97sec    Time 8   Period Weeks    Status Achieved  39.44sec     OT LONG TERM GOAL #4   Title Pt will improve LUE functional reaching as shown by demo at least 110* shoulder flex and at least -25* elbow ext.   Baseline 105* shoulder flex, -25* elbow ext   Time 8   Period Weeks   Status Achieved  09/14/16:  115* with -25*,      OT LONG TERM GOAL #5   Title Pt will report bilateral shoulder pain less than or equal to 4/10 for ADLs.   Baseline 7/10   Time 8   Period Weeks   Status Achieved  09/14/16  per pt report (at or below this  level)               Plan - 10-06-16 0951    Clinical Impression Statement Pt has made progress with movement amplitude, bradykinesia, and rigidity for functional tasks.  Cognition impacts carryover and consistently performing HEP at home.   Rehab Potential Fair   Clinical Impairments Affecting Rehab Potential cognitive deficits   OT Frequency 2x / week   OT Duration 8 weeks   OT Treatment/Interventions Self-care/ADL training;Energy conservation;Manual Therapy;Cognitive remediation/compensation;Passive range of motion;Functional Mobility Training;Neuromuscular education;Cryotherapy;Therapeutic exercise;Ultrasound;Therapeutic activities;Therapeutic exercises;Balance training;Patient/family education;Splinting;DME and/or AE instruction;Moist Heat   Plan d/c OT; recommended scheduling eval in approx 6-87month   OT Home Exercise Plan Education provided:  supine PWR! (up, rock, twist), ball ex in supine, handwriting strategies   Consulted and Agree with Plan of Care Patient      Patient will benefit from skilled therapeutic intervention in order to improve the following deficits and impairments:  Decreased coordination, Decreased range of motion, Abnormal gait, Pain, Impaired UE functional use, Decreased knowledge of use of DME, Decreased balance, Decreased cognition, Decreased mobility, Decreased strength, Impaired perceived functional ability, Impaired tone, Decreased activity tolerance,  Decreased endurance, Decreased safety awareness, Improper spinal/pelvic alignment, Decreased knowledge of precautions  Visit Diagnosis: No diagnosis found.      G-Codes - 0May 02, [id number]    Functional Assessment Tool Used (Outpatient only) PPT#4 39.44sec.   Standing functional reach R-9.5", L-10"   Functional Limitation Self care   Self Care Goal Status (([id number] At least 20 percent but less than 40 percent impaired, limited or restricted   Self Care Discharge Status [phone] At least 20 percent but less than 40 percent impaired, limited or restricted      Problem List Patient Active Problem List   Diagnosis Date Noted  . [place] mild ([place] 04/09/2014  . Unstable angina ([place] 01/17/2014  . Mitral regurgitation   . GERD (gastroesophageal reflux disease)   . [place] mellitus) ([place]   . Asymptomatic LV dysfunction   . CAD (coronary artery disease) 01/15/2014  . Abnormal nuclear cardiac imaging test 01/14/2014  . Chronic renal disease, stage III 01/14/2014  . Sinus bradycardia- HR 43 01/14/2014  . Essential hypertension 11/16/2013  . Hyperlipidemia 11/16/2013  . Depressive disorder, not elsewhere classified 05/08/2013    OCCUPATIONAL THERAPY DISCHARGE SUMMARY  Visits from Start of Care: 10  Current functional level related to goals / functional outcomes: See above   Remaining deficits: Bradykinesia, rigidity, decr posture, decr balance/functional mobility for ADLs, decr coordination, decr ROM, shoulder pain   Education / Equipment: Pt was instructed in the following:  PD-specific HEP, adaptive strategies for ADLs/IADLs, ways to prevent future complications, appropriate community resources.  Pt verbalized understanding of all education provided.    Plan: Patient agrees to discharge.  Patient goals were met. Patient is being discharged due to                                                     Reaching maximal rehab potential at this time.  Pt would benefit from occupational  therapy evaluation in approx 6-7 months to assess for need for further therapy/functional changes due to progressive nature of diagnosis. ?????        [facility]405-07-2016 9:58 AM  [place]  [address] Phone: [phone]   Fax:  [phone]  Name: PIERSON VANTOL MRN: [id number] Date of Birth: 19-Oct-1928   [name], OTR/L [facility] [address] [place], [place]  [address] [phone] phone [phone] 09/16/16 9:59 AM

## 2016-09-16 NOTE — Therapy (Signed)
[facility] [address] [place], [place], [address] Phone: [phone]   Fax:  [phone]  Physical Therapy Treatment  Patient Details  Name: Ronald Benitez MRN: [id number] Date of Birth: 11/27/1928 Referring Provider: Tat, Rebecca  Encounter Date: 09/16/2016      PT End of Session - 09/16/16 1159    Visit Number 11   Number of Visits 17   Date for PT Re-Evaluation 10/02/16   Authorization Type [employer] H[employer]-GCODE every 10th visit   PT Start Time 1102   PT Stop Time 1130   PT Time Calculation (min) 28 min   Activity Tolerance Patient tolerated treatment well   Behavior During Therapy Flat affect      Past Medical History:  Diagnosis Date  . Asymptomatic LV dysfunction    a. 2D ECHO on 12/04/2013. LV EF: 50% - 55%; G3DD, mild AR, mod MR, mild RA dilation, increased thickness of atrial septum, c/w lipomatous hypertrophy. Small patent foramen ovale. Present only with provocation from having the patient cough. Agitated saline contrast study showed a very small right-to-left atrial level shunt, in the baseline state.  . CAD (coronary artery disease)    a. 01/16/14 s/p DES x2 to RCA. b remote RCA stenting 1997   . Chronic kidney disease   . Depression   . DM (diabetes mellitus) (HCC)   . GERD (gastroesophageal reflux disease)   . HTN (hypertension)   . Hyperlipidemia   . Mitral regurgitation    a. mod by 11/2013 ECHO    Past Surgical History:  Procedure Laterality Date  . ANGIOPLASTY  1997  . ESOPHAGOGASTRODUODENOSCOPY ENDOSCOPY    . LEFT HEART CATHETERIZATION WITH CORONARY ANGIOGRAM N/A 01/15/2014   Procedure: LEFT HEART CATHETERIZATION WITH CORONARY ANGIOGRAM;  Surgeon: Jonathan J Berry, MD;  Location: [facility] CATH LAB;  Service: Cardiovascular;  Laterality: N/A;  . PERCUTANEOUS CORONARY ROTOBLATOR INTERVENTION (PCI-R) N/A 01/16/2014   Procedure: PERCUTANEOUS CORONARY ROTOBLATOR INTERVENTION (PCI-R);   Surgeon: Thomas A Kelly, MD;  Location: [facility] CATH LAB;  Service: Cardiovascular;  Laterality: N/A;    There were no vitals filed for this visit.      Subjective Assessment - 09/16/16 1105    Subjective Denies falling. Denies walking at home. Reports he will do his [place] exercise program.    How long can you walk comfortably? No    Patient Stated Goals Pt's goal for therapy is to be able to move easier and better.   Currently in Pain? No/denies   Pain Onset More than a month ago            [facility] PT Assessment - 09/16/16 1111      Functional Gait  Assessment   Gait assessed  Yes   Gait Level Surface Walks 20 ft in less than 7 sec but greater than 5.5 sec, uses assistive device, slower speed, mild gait deviations, or deviates 6-10 in outside of the 12 in walkway width.   Change in Gait Speed Able to change speed, demonstrates mild gait deviations, deviates 6-10 in outside of the 12 in walkway width, or no gait deviations, unable to achieve a major change in velocity, or uses a change in velocity, or uses an assistive device.   Gait with Horizontal Head Turns Performs head turns smoothly with slight change in gait velocity (eg, minor disruption to smooth gait path), deviates 6-10 in outside 12 in walkway width, or uses an assistive device.   Gait with Vertical Head Turns Performs   task with slight change in gait velocity (eg, minor disruption to smooth gait path), deviates 6 - 10 in outside 12 in walkway width or uses assistive device   Gait and Pivot Turn Pivot turns safely in greater than 3 sec and stops with no loss of balance, or pivot turns safely within 3 sec and stops with mild imbalance, requires small steps to catch balance.   Step Over Obstacle Is able to step over one shoe box (4.5 in total height) without changing gait speed. No evidence of imbalance.   Gait with Narrow Base of Support Ambulates 4-7 steps.   Gait with Eyes Closed Walks 20 ft, slow speed, abnormal gait pattern,  evidence for imbalance, deviates 10-15 in outside 12 in walkway width. Requires more than 9 sec to ambulate 20 ft.   Ambulating Backwards Walks 20 ft, no assistive devices, good speed, no evidence for imbalance, normal gait   Steps Alternating feet, must use rail.   Total Score 19                     [facility] Adult PT Treatment/Exercise - 09/16/16 1111      Transfers   Transfers Sit to Stand;Stand to Sit   Sit to Stand Without upper extremity assist;6: Modified independent (Device/Increase time);With upper extremity assist   Five time sit to stand comments  15.78     Ambulation/Gait   Ambulation/Gait Assistance 5: Supervision   Ambulation/Gait Assistance Details vc for upright posture, arm swing and incr step length   Ambulation Distance (Feet) 120 Feet   Assistive device None   Gait Pattern Step-through pattern;Decreased arm swing - left;Decreased step length - right;Decreased step length - left;Lateral trunk lean to right;Trunk flexed;Narrow base of support;Poor foot clearance - left;Poor foot clearance - right   Ambulation Surface Level;Indoor     Posture/Postural Control   Posture/Postural Control Postural limitations   Postural Limitations Rounded Shoulders;Forward head;Posterior pelvic tilt;Weight shift right     Standardized Balance Assessment   Standardized Balance Assessment Timed Up and Go Test     Timed Up and Go Test   TUG Normal TUG   Normal TUG (seconds) 11.78                PT Education - 09/16/16 1158    Education provided Yes   Education Details results of standardized testing; importance of continuing HEP   Person(s) Educated Patient   Methods Explanation   Comprehension Verbalized understanding          PT Short Term Goals - 09/02/16 1534      PT SHORT TERM GOAL #1   Title Pt will perform HEP with family supervision, for improved functional mobility, gait and transfers.  TARGET 09/01/16   Baseline Pt reports performing consistently  on his own and has no questions about HEP; 09/01/16   Time 4   Period Weeks   Status Achieved     PT SHORT TERM GOAL #2   Title Pt will improve 5x sit<>stand to less than or equal to 20 seconds for improved transfer efficiency and safety.   Baseline 5X sit<>stands 18 sec without UE support; 09/01/16.   Time 4   Period Weeks   Status Achieved     PT SHORT TERM GOAL #3   Title Pt will improve TUG cog score to less than or equal to 15 seconds for decreased fall risk.   Baseline TUG Cognitive 17.9 sec   Time 4   Period Weeks  Status Not Met     PT SHORT TERM GOAL #4   Title Pt will improve Functional Gait Assessment to at least 20/30 for decreased fall risk.   Baseline FGA 20/30; 09/01/16.   Time 4   Period Weeks   Status Achieved           PT Long Term Goals - 09/25/2016 1559      PT LONG TERM GOAL #1   Title Pt will verbalize understanding of fall prevention in the home environment.  TARGET 10/02/16   Baseline 2016-09-25 Patient able to verbalize wife removed throw rugs and keeps lamp on in the hall for getting to bathroom at night.   Time 8   Period Weeks   Status Achieved     PT LONG TERM GOAL #2   Title Pt will improve 5x sit<>stand to less than or equal to 17 seconds for improved transfer efficiency and safety.   Baseline 25-Sep-2016  15.78 sec   Time 8   Status Achieved     PT LONG TERM GOAL #3   Title Pt will improve TUG manual score to less than or equal to 14.5 seconds for decreased fall risk.   Baseline 09-25-16 11.78   Time 8   Period Weeks   Status Achieved     PT LONG TERM GOAL #4   Title Pt will improve FGA score to at least 23/30 for decreased fall risk, improved gait efficiency and safety.   Baseline September 25, 2016 19/30   Time 8   Period Weeks   Status Not Met     PT LONG TERM GOAL #5   Title Pt will verbalize plans for continued community fitness upon D/C from PT.   Baseline 2016-09-25 Patient reports he does not think he will continue a walking program after d/c  due to knee pain. He reports he will continue to do the exercises [facility]) given   Time 8   Period Weeks   Status Achieved               Plan - 2016/09/25 1201    Clinical Impression Statement Patient prepared for discharge and LTGs assessed. Patient met 4 of 5 LTGs (did not meet goal for FGA; goal 23, scored 19). Discussed with wife on previous visit the importance of encouraging pt to walk. Due to degenerative progression of Parkinson's, plan to schedule patient for a PT evaluaton in 6 months to assess if he is maintaining his mobility to decrease his fall risk.    Rehab Potential Good   PT Frequency 2x / week   PT Duration 8 weeks   PT Treatment/Interventions ADLs/Self Care Home Management;Functional mobility training;Gait training;Therapeutic activities;Therapeutic exercise;Balance training;Neuromuscular re-education;Patient/family education   PT Next Visit Plan --   Consulted and Agree with Plan of Care Patient      Patient will benefit from skilled therapeutic intervention in order to improve the following deficits and impairments:  Abnormal gait, Decreased balance, Decreased mobility, Decreased strength, Difficulty walking, Postural dysfunction  Visit Diagnosis: Other symptoms and signs involving the nervous system  Other symptoms and signs involving the musculoskeletal system  Other abnormalities of gait and mobility  Unsteadiness on feet       G-Codes - 2016/09/25 1610    Functional Assessment Tool Used (Outpatient Only) gait velocity 3.04 ft/sec (previously 2.73 ft/sec)   Functional Limitation Mobility: Walking and moving around   Mobility: Walking and Moving Around Goal Status [phone]) At least 20 percent but less than 40 percent  impaired, limited or restricted   Mobility: Walking and Moving Around Discharge Status ([id number]) At least 20 percent but less than 40 percent impaired, limited or restricted      Problem List Patient Active Problem List   Diagnosis Date  Noted  . COPD, mild (HCC) 04/09/2014  . Unstable angina (HCC) 01/17/2014  . Mitral regurgitation   . GERD (gastroesophageal reflux disease)   . DM (diabetes mellitus) (HCC)   . Asymptomatic LV dysfunction   . CAD (coronary artery disease) 01/15/2014  . Abnormal nuclear cardiac imaging test 01/14/2014  . Chronic renal disease, stage III 01/14/2014  . Sinus bradycardia- HR 43 01/14/2014  . Essential hypertension 11/16/2013  . Hyperlipidemia 11/16/2013  . Depressive disorder, not elsewhere classified 05/08/2013   PHYSICAL THERAPY DISCHARGE SUMMARY  Visits from Start of Care: 11  Current functional level related to goals / functional outcomes: See above   Remaining deficits: Remains a fall risk with FGA score of 19   Education / Equipment: HEP  Plan: Patient agrees to discharge.  Patient goals were partially met. Patient is being discharged due to the patient's request.  ?????        P  09/16/2016, 4:12 PM  [facility] [address] [place], [place], [address] Phone: [phone]   Fax:  [phone]  Name: Ronald Benitez MRN: [id number] Date of Birth: 12/25/1928   

## 2016-09-16 NOTE — Therapy (Signed)
[facility] [address] [place], [place], [address] Phone: [phone]   Fax:  [phone]  Speech Language Pathology Treatment  Patient Details  Name: Ronald Benitez MRN: [id number] Date of Birth: Feb 28, 1929 Referring Provider: Dr. Wells Guiles Tat  Encounter Date: 09/16/2016      End of Session - 09/16/16 1057    Visit Number 9   Number of Visits 12   Date for SLP Re-Evaluation 09/10/16   SLP Start Time 65   SLP Stop Time  1100   SLP Time Calculation (min) 40 min   Activity Tolerance Patient tolerated treatment well      Past Medical History:  Diagnosis Date  . Asymptomatic LV dysfunction    a. 2D ECHO on 12/04/2013. LV EF: 50% - 55%; G3DD, mild AR, mod MR, mild RA dilation, increased thickness of atrial septum, c/w lipomatous hypertrophy. Small patent foramen ovale. Present only with provocation from having the patient cough. Agitated saline contrast study showed a very small right-to-left atrial level shunt, in the baseline state.  [place] CAD (coronary artery disease)    a. 01/16/14 s/p DES x2 to RCA. b remote RCA stenting 1997   . Chronic kidney disease   . Depression   . DM (diabetes mellitus) ([place])   . GERD (gastroesophageal reflux disease)   . HTN (hypertension)   . Hyperlipidemia   . Mitral regurgitation    a. mod by 11/2013 ECHO    Past Surgical History:  Procedure Laterality Date  . ANGIOPLASTY  1997  . ESOPHAGOGASTRODUODENOSCOPY ENDOSCOPY    . LEFT HEART CATHETERIZATION WITH CORONARY ANGIOGRAM N/A 01/15/2014   Procedure: LEFT HEART CATHETERIZATION WITH CORONARY ANGIOGRAM;  Surgeon: Lorretta Harp, MD;  Location: [facility] CATH LAB;  Service: Cardiovascular;  Laterality: N/A;  . PERCUTANEOUS CORONARY ROTOBLATOR INTERVENTION (PCI-R) N/A 01/16/2014   Procedure: PERCUTANEOUS CORONARY ROTOBLATOR INTERVENTION (PCI-R);  Surgeon: Troy Sine, MD;  Location: [facility] CATH LAB;  Service: Cardiovascular;  Laterality: N/A;     There were no vitals filed for this visit.      Subjective Assessment - 09/16/16 1028    Subjective "I did all of these - it was hard to think of 3 things for each" re: homework   Currently in Pain? No/denies               ADULT SLP TREATMENT - 09/16/16 1029      General Information   Behavior/Cognition Alert;Cooperative;Pleasant mood;Hard of hearing     Treatment Provided   Treatment provided Cognitive-Linquistic     Pain Assessment   Pain Assessment No/denies pain     Cognitive-Linquistic Treatment   Treatment focused on Dysarthria   Skilled Treatment Structured speech tasks using compensations with mod I. Simple conversation with compensations, pt maintained volume and over articulation with rare min A.     Assessment / Recommendations / Plan   Plan Discharge SLP treatment due to (comment)     Progression Toward Goals   Progression toward goals Goals met, education completed, patient discharged from SLP          SLP Education - 09/16/16 1054    Education Details Continue all ST, OT. PT exercises upon d/c, be aware of any changes in swallowing      SPEECH THERAPY DISCHARGE SUMMARY  Visits from Start of Care: 8  Current functional level related to goals / functional outcomes: See goals below   Remaining deficits: Mild hypokinetic dysarthria c/w Parkinson's Disease   Education /  Equipment: Compensations for dysarthria, HEP for dysarthria; s/s of dysphagia Plan: Patient agrees to discharge.  Patient goals were met. Patient is being discharged due to meeting the stated rehab goals.  ?????           SLP Short Term Goals - 2016/10/09 1046      SLP SHORT TERM GOAL #1   Title Pt will verbalize process of diaphragmatic breathing with mod cues for accuracy   Status Achieved     SLP SHORT TERM GOAL #2   Title Pt will demonstrate diaphragmatic breathing during 2 therapy sessions   Status Achieved     SLP SHORT TERM GOAL #3   Title Pt will  identify and implement compensatory strategy for improved functional recall with mod cues for use outside of therapy   Status Deferred  focus on speech intelligibility          SLP Long Term Goals - 2016-10-09 1047      SLP LONG TERM GOAL #1   Title Pt will verbalize the process of diaphragmatic breathing with min cues for accuracy   Time 2   Period Weeks   Status Achieved     SLP LONG TERM GOAL #2   Title Pt will demonstrate diaphragmatic breathing during functional tasks during therapy, and report practice at home   Time 2   Period Weeks   Status Achieved     SLP LONG TERM GOAL #3   Title Pt will report use of compensatory strategy for improved functional recall between sessions   Time 2   Period Weeks   Status Deferred     SLP LONG TERM GOAL #4   Title in simple to mod complex conversation of 10 minutes, pt will maintain intelligibility of 95% over three sessions   Baseline 09/14/16;    Time 2   Period Weeks   Status Achieved          Plan - 10/09/16 1054    Clinical Impression Statement Pt continues to maintain good volume and utilization of compensations for dysarthira. Pt 100% intellgible in conversation today over 20 minutes. All goals met, d/c ST - pt in agreement. When questioned, pt did report some minor swallowing difficulties - pt instructed to tell Dr. Carles Collet if swallow difficulty increases.   Speech Therapy Frequency 2x / week   Treatment/Interventions Functional tasks;Patient/family education;SLP instruction and feedback   Potential to Achieve Goals Good   Potential Considerations Ability to learn/carryover information   Consulted and Agree with Plan of Care Patient      Patient will benefit from skilled therapeutic intervention in order to improve the following deficits and impairments:   Dysarthria and anarthria      G-Codes - 2016/10/09 1100    Functional Assessment Tool Used asha noms, clinical judgment,    Functional Limitations Other Speech  Language Pathology   Other Speech-Language Pathology Functional Limitation Goal Status [phone]) At least 1 percent but less than 20 percent impaired, limited or restricted   Other Speech-Language Pathology Functional Limitation Discharge Status [phone]) At least 1 percent but less than 20 percent impaired, limited or restricted      Problem List Patient Active Problem List   Diagnosis Date Noted  . COPD, mild ([place]) 04/09/2014  . Unstable angina ([place]) 01/17/2014  . Mitral regurgitation   . GERD (gastroesophageal reflux disease)   . DM (diabetes mellitus) ([place])   . Asymptomatic LV dysfunction   . CAD (coronary artery disease) 01/15/2014  . Abnormal nuclear cardiac  imaging test 01/14/2014  . Chronic renal disease, stage III 01/14/2014  . Sinus bradycardia- HR 43 01/14/2014  . Essential hypertension 11/16/2013  . Hyperlipidemia 11/16/2013  . Depressive disorder, not elsewhere classified 05/08/2013    Lovvorn, Annye Rusk  MS, [place] 09/16/2016, 11:01 AM  [facility] [address] [place], [place], [address] Phone: [phone]   Fax:  [phone]   Name: Ronald Benitez MRN: [id number] Date of Birth: September 30, 1928

## 2016-09-16 NOTE — Patient Instructions (Addendum)
  Tips for helping you hear/understand others:  Family should get your attention before they speak to you  Use eye contact and face the person you are speaking to  Be in close proximity to the person you are speaking to  Turn down any noise in the environment such as the TV, walk away from loud appliances, air conditioners, fans, dish washers etc   SLOP:  Slow rate of speech Loud Over enunciate Pause   Be aware of any changes in your swallowing - coughing, choking, getting strangled, pills getting stuck Food in you lungs can cause pneumonia Let Dr. Carles Collet know if you have more difficulty swallowing

## 2016-11-09 ENCOUNTER — Ambulatory Visit: Payer: Self-pay | Admitting: Surgery

## 2016-11-09 NOTE — H&P (Signed)
Ranier Coach Perdue 11/09/2016 11:35 AM Location: [facility] Patient #: [id number] DOB: [birth year] Married / Language: Cleophus Molt / Race: White Male   History of Present Illness [name] MD; 11/09/2016 12:53 PM) The patient is a 83 year old male who presents with a subcutaneous abscess. Note for "Subcutaneous abscess": ` ` ` Patient sent for surgical consultation at the request of Dr. Alesia Banda  Chief Complaint: Status post incision and drainage of gluteal abscess 10/04/2016  The patient is an elderly gentleman with Parkinson's disease control and Sinemet. Chronic kidney disease. Had abscess on his left inner gluteal cheek. Underwent incision and drainage April 30, now 6 weeks ago.   He returns for follow-up. He comes today with his wife. Still has pain on his tailbone. He still has the wound. It has not closed. Still annoying to sit on it. Occasional foul odor. Not worsening redness though. He still severely constipated. Takes a laxative to move his bowels once a week or so. No nausea or vomiting. No new lesions. Not on any antibiotics anymore.  (Review of systems as stated in this history (HPI) or in the review of systems. Otherwise all other 12 point ROS are negative)   Problem List/Past Medical [name], MD; 11/09/2016 11:39 AM) ABSCESS, GLUTEAL, LEFT (L02.31)  CHRONIC CONSTIPATION (K59.09)   Past Surgical History [name], MD; 11/09/2016 11:39 AM) Cataract Surgery  Bilateral. Colon Polyp Removal - Colonoscopy  Knee Surgery  Left. Tonsillectomy  Vasectomy   Diagnostic Studies History [name], MD; 11/09/2016 11:39 AM) Colonoscopy  within last year  Allergies (Yasmin Roberson, CMA; 11/09/2016 11:36 AM) No Known Allergies 10/04/2016 Allergies Reconciled   Medication History (Yasmin Roberson, CMA; 11/09/2016 11:36 AM) Simvastatin (80MG  Tablet, Oral) Active. Nitrofurantoin Macrocrystal (100MG  Capsule, Oral)  Active. Metoprolol Tartrate (25MG  Tablet, Oral) Active. Glimepiride (2MG  Tablet, Oral) Active. Furosemide (20MG  Tablet, Oral) Active. Desipramine HCl (50MG  Tablet, Oral) Active. LORazepam (0.5MG  Tablet, Oral) Active. Captopril (12.5MG  Tablet, Oral) Active. Carbidopa-Levodopa (25-100MG  Tablet, Oral) Active. Cephalexin (500MG  Capsule, Oral) Active. Medications Reconciled  Social History [name], MD; 11/09/2016 11:39 AM) No alcohol use  No caffeine use  Tobacco use  Former smoker.  Family History [name], MD; 11/09/2016 11:39 AM) Diabetes Mellitus  Father.  Other Problems [name], MD; 11/09/2016 11:39 AM) Anxiety Disorder  Gastroesophageal Reflux Disease  High blood pressure  Myocardial infarction     Review of Systems [name], MD; 11/09/2016 11:40 AM) Skin Present- New Lesions. Not Present- Change in Wart/Mole, Dryness, Hives, Jaundice, Non-Healing Wounds, Rash and Ulcer. Cardiovascular Present- Swelling of Extremities. Not Present- Chest Pain, Difficulty Breathing Lying Down, Leg Cramps, Palpitations, Rapid Heart Rate and Shortness of Breath. Gastrointestinal Present- Constipation and Hemorrhoids. Not Present- Abdominal Pain, Bloating, Bloody Stool, Change in Bowel Habits, Chronic diarrhea, Difficulty Swallowing, Excessive gas, Gets full quickly at meals, Indigestion, Nausea, Rectal Pain and Vomiting. Male Genitourinary Present- Frequency. Not Present- Blood in Urine, Change in Urinary Stream, Impotence, Nocturia, Painful Urination, Urgency and Urine Leakage.  Vitals (Yasmin Roberson CMA; 11/09/2016 11:36 AM) 11/09/2016 11:35 AM Weight: 171.2 lb Height: 71.5in Body Surface Area: 1.98 m Body Mass Index: 23.54 kg/m  Pulse: 54 (Regular)  BP: 124/66 (Sitting, Left Arm, Standard)       Physical Exam [name] MD; 11/09/2016 12:54 PM) General Mental Status-Alert. General Appearance-Not in acute  distress. Voice-Normal. Note: Thin. Somewhat mask face. Mumbling speech but mostly comprehensible.   Integumentary Global Assessment Normal Exam -  Distribution of scalp and body hair is normal. General Characteristics Overall Skin Surface - no rashes and no suspicious lesions.  Head and Neck Head-normocephalic, atraumatic with no lesions or palpable masses. Face Global Assessment - atraumatic, no absence of expression. Neck Global Assessment - no abnormal movements, no decreased range of motion. Trachea-midline. Thyroid Gland Characteristics - non-tender.  Eye Eyeball - Left-Extraocular movements intact, No Nystagmus. Eyeball - Right-Extraocular movements intact, No Nystagmus. Upper Eyelid - Left-No Cyanotic. Upper Eyelid - Right-No Cyanotic. Note: Wears glasses. Vision corrected   Chest and Lung Exam Inspection Accessory muscles - No use of accessory muscles in breathing.  Rectal Note: Left inner gluteal 30 x 30 mm nodule with 1 cm deep wound. Unchanged from last visit. No cellulitis. No deeper infection. Not consistent with sacral decubitus.  No perianal lesions. Mildly decreased but normal sphincter tone. No internal/external hemorrhoids. No other abscess. No sinus. No fissure. No anal fistula. No pilonidal disease. No pits. No hair burden.   Peripheral Vascular Upper Extremity Inspection - Left - Not Gangrenous, No Petechiae. Right - Not Gangrenous, No Petechiae.  Neurologic Neurologic evaluation reveals -normal attention span and ability to concentrate, able to name objects and repeat phrases. Appropriate fund of knowledge and normal coordination.  Neuropsychiatric Mental status exam performed with findings of-able to articulate well with normal speech/language, rate, volume and coherence and no evidence of hallucinations, delusions, obsessions or homicidal/suicidal ideation. Orientation-oriented  X3.  Musculoskeletal Global Assessment Gait and Station - normal gait and station.  Lymphatic General Lymphatics Description - No Generalized lymphadenopathy.    Assessment & Plan [name] MD; 11/09/2016 11:58 AM) ABSCESS, GLUTEAL, LEFT (L02.31) Impression: Abscess on left inner gluteal cheek. Cellulitis resolved but with persistent chronic wound not healing.  The wound has not healed any further. I didn't think it will heal unless it's more completely excised. Still really sensitive & uncomfortable. It seems mobile in the soft tissues, so I doubt a margins ulcer cancer. Nonetheless needs to be removed.  Hopefully can do this with some deep sedation in the OR and holding Plavix. Think it's too much of an area to try and deal with in the office alone. Would like to make sure it safe to come off since he's had prior coronary stenting. Last 2015 Current Plans You are being scheduled for surgery- Our schedulers will call you.  You should hear from our office's scheduling department within 5 working days about the location, date, and time of surgery. We try to make accommodations for patient's preferences in scheduling surgery, but sometimes the OR schedule or the surgeon's schedule prevents [place] from making those accommodations.  If you have not heard from our office [phone]) in 5 working days, call the office and ask for your surgeon's nurse.  If you have other questions about your diagnosis, plan, or surgery, call the office and ask for your surgeon's nurse.  The pathophysiology of skin & subcutaneous masses was discussed. Natural history risks without surgery were discussed. I recommended surgery to remove the mass. I explained the technique of removal with use of local anesthesia & possible need for more aggressive sedation/anesthesia for patient comfort.  Risks such as bleeding, infection, wound breakdown, heart attack, death, and other risks were discussed. I noted a  good likelihood this will help address the problem. Possibility that this will not correct all symptoms was explained. Possibility of regrowth/recurrence of the mass was discussed. We will work to minimize complications. Questions were answered. The patient expresses understanding &  wishes to proceed with surgery.  Pt Education - [facility] Wound Care Instructions ([place] Ursua): discussed with patient and provided information. ANTICOAGULATED (Z79.01) Impression: Like to get input from his cardiologist, Dr. Alvester Chou, to make sure it safe to come off his Plavix perioperatively. Sounds like he was able to have it held for a dental procedure earlier earlier in the year without incident. Current Plans I recommended obtaining preoperative cardiac clearance. I am concerned about the health of the patient and the ability to tolerate the operation. Therefore, we will request clearance by cardiology to better assess operative risk & see if a reevaluation, further workup, etc is needed. Also recommendations on how medications such as for anticoagulation and blood pressure should be managed/held/restarted after surgery. Pt Education - [facility] Hold anticoagulation preoperatively CHRONIC CONSTIPATION (K59.09) Current Plans Pt Education - [facility] Constipation (AT) Pt Education - [facility] Good Bowel Health (Troy Hartzog)    [name], M.D., F.A.C.S. Gastrointestinal and Minimally Invasive Surgery [facility] [address], [place], [place] [address] [phone] Main / Paging

## 2016-11-11 ENCOUNTER — Encounter (HOSPITAL_COMMUNITY): Payer: Self-pay | Admitting: Emergency Medicine

## 2016-11-11 ENCOUNTER — Emergency Department (HOSPITAL_COMMUNITY)
Admission: EM | Admit: 2016-11-11 | Discharge: 2016-11-11 | Disposition: A | Discharge status: Home / Self Care | Payer: Medicare Other | Attending: Emergency Medicine | Admitting: Emergency Medicine

## 2016-11-11 ENCOUNTER — Emergency Department (HOSPITAL_COMMUNITY): Payer: Medicare Other

## 2016-11-11 DIAGNOSIS — J449 Chronic obstructive pulmonary disease, unspecified: Secondary | ICD-10-CM | POA: Diagnosis not present

## 2016-11-11 DIAGNOSIS — R339 Retention of urine, unspecified: Secondary | ICD-10-CM | POA: Insufficient documentation

## 2016-11-11 DIAGNOSIS — K5900 Constipation, unspecified: Secondary | ICD-10-CM | POA: Insufficient documentation

## 2016-11-11 DIAGNOSIS — I2510 Atherosclerotic heart disease of native coronary artery without angina pectoris: Secondary | ICD-10-CM | POA: Insufficient documentation

## 2016-11-11 DIAGNOSIS — Z7982 Long term (current) use of aspirin: Secondary | ICD-10-CM | POA: Diagnosis not present

## 2016-11-11 DIAGNOSIS — N183 Chronic kidney disease, stage 3 (moderate): Secondary | ICD-10-CM | POA: Insufficient documentation

## 2016-11-11 DIAGNOSIS — Z87891 Personal history of nicotine dependence: Secondary | ICD-10-CM | POA: Insufficient documentation

## 2016-11-11 DIAGNOSIS — Z7984 Long term (current) use of oral hypoglycemic drugs: Secondary | ICD-10-CM | POA: Insufficient documentation

## 2016-11-11 DIAGNOSIS — I129 Hypertensive chronic kidney disease with stage 1 through stage 4 chronic kidney disease, or unspecified chronic kidney disease: Secondary | ICD-10-CM | POA: Diagnosis not present

## 2016-11-11 DIAGNOSIS — R1031 Right lower quadrant pain: Secondary | ICD-10-CM

## 2016-11-11 LAB — CBC
HCT: 38.4 % — ABNORMAL LOW (ref 39.0–52.0)
Hemoglobin: 12.8 g/dL — ABNORMAL LOW (ref 13.0–17.0)
MCH: 30.6 pg (ref 26.0–34.0)
MCHC: 33.3 g/dL (ref 30.0–36.0)
MCV: 91.9 fL (ref 78.0–100.0)
PLATELETS: 170 10*3/uL (ref 150–400)
RBC: 4.18 MIL/uL — ABNORMAL LOW (ref 4.22–5.81)
RDW: 14.2 % (ref 11.5–15.5)
WBC: 8.9 10*3/uL (ref 4.0–10.5)

## 2016-11-11 LAB — COMPREHENSIVE METABOLIC PANEL
ALBUMIN: 4 g/dL (ref 3.5–5.0)
ALK PHOS: 71 U/L (ref 38–126)
AST: 26 U/L (ref 15–41)
Anion gap: 9 (ref 5–15)
BILIRUBIN TOTAL: 1.7 mg/dL — AB (ref 0.3–1.2)
BUN: 46 mg/dL — AB (ref 6–20)
CALCIUM: 9.2 mg/dL (ref 8.9–10.3)
CO2: 24 mmol/L (ref 22–32)
CREATININE: 2.78 mg/dL — AB (ref 0.61–1.24)
Chloride: 104 mmol/L (ref 101–111)
GFR calc Af Amer: 22 mL/min — ABNORMAL LOW (ref >60)
GFR, EST NON AFRICAN AMERICAN: 19 mL/min — AB (ref >60)
GLUCOSE: 152 mg/dL — AB (ref 65–99)
Potassium: 4.1 mmol/L (ref 3.5–5.1)
Sodium: 137 mmol/L (ref 135–145)
Total Protein: 6.9 g/dL (ref 6.5–8.1)

## 2016-11-11 LAB — URINALYSIS, ROUTINE W REFLEX MICROSCOPIC
Bilirubin Urine: NEGATIVE
Glucose, UA: 50 mg/dL — AB
HGB URINE DIPSTICK: NEGATIVE
Ketones, ur: 5 mg/dL — AB
LEUKOCYTES UA: NEGATIVE
Nitrite: NEGATIVE
Protein, ur: 100 mg/dL — AB
SPECIFIC GRAVITY, URINE: 1.016 (ref 1.005–1.030)
pH: 5 (ref 5.0–8.0)

## 2016-11-11 LAB — LIPASE, BLOOD: Lipase: 52 U/L — ABNORMAL HIGH (ref 11–51)

## 2016-11-11 MED ORDER — IOPAMIDOL (ISOVUE-300) INJECTION 61%: 30.0000 mL | Freq: Once | INTRAVENOUS | Status: DC | PRN

## 2016-11-11 MED ORDER — LACTULOSE 10 GM/15ML PO SOLN: 30.0000 g | Freq: Every day | ORAL | 0 refills | Status: AC

## 2016-11-11 NOTE — ED Notes (Signed)
Bladder scan showed >999 in bladder 

## 2016-11-11 NOTE — ED Provider Notes (Signed)
[facility] DEPT Provider Note   CSN: [id number] Arrival date & time: 11/11/16  1602     History   Chief Complaint Chief Complaint  Patient presents with  . Abdominal Pain  . Constipation  . Urinary Retention    HPI Ronald Benitez is a 83 y.o. male.  HPI   Presents with concern for urinary retention and constipation.  Tried magnesium citrate and water, miralax also, went back to exlax and that didn't help either.  Has not had BM for 5-6 days. Had small BM last weekend.  Still passing flatus.  No nausea or vomiting. Not eating today.  Yesterday and last night had pain umbilical and suprapubic. Able to urinate a little today, but very little 11AM, yesterday urinated at noon.  Referred to Dr. Hollie Salk Nephrologist. Also has urologist. No hx of retention of urine before.   Past Medical History:  Diagnosis Date  . Asymptomatic LV dysfunction    a. 2D ECHO on 12/04/2013. LV EF: 50% - 55%; G3DD, mild AR, mod MR, mild RA dilation, increased thickness of atrial septum, c/w lipomatous hypertrophy. Small patent foramen ovale. Present only with provocation from having the patient cough. Agitated saline contrast study showed a very small right-to-left atrial level shunt, in the baseline state.  Marland Kitchen CAD (coronary artery disease)    a. 01/16/14 s/p DES x2 to RCA. b remote RCA stenting 1997   . Chronic kidney disease   . Depression   . DM (diabetes mellitus) ([place])   . GERD (gastroesophageal reflux disease)   . HTN (hypertension)   . Hyperlipidemia   . Mitral regurgitation    a. mod by 11/2013 ECHO    Patient Active Problem List   Diagnosis Date Noted  . COPD, mild ([place]) 04/09/2014  . Unstable angina ([place]) 01/17/2014  . Mitral regurgitation   . GERD (gastroesophageal reflux disease)   . DM (diabetes mellitus) ([place])   . Asymptomatic LV dysfunction   . CAD (coronary artery disease) 01/15/2014  . Abnormal nuclear cardiac imaging test 01/14/2014  . Chronic renal disease, stage III  01/14/2014  . Sinus bradycardia- HR 43 01/14/2014  . Essential hypertension 11/16/2013  . Hyperlipidemia 11/16/2013  . Depressive disorder, not elsewhere classified 05/08/2013    Past Surgical History:  Procedure Laterality Date  . ANGIOPLASTY  1997  . ESOPHAGOGASTRODUODENOSCOPY ENDOSCOPY    . LEFT HEART CATHETERIZATION WITH CORONARY ANGIOGRAM N/A 01/15/2014   Procedure: LEFT HEART CATHETERIZATION WITH CORONARY ANGIOGRAM;  Surgeon: Lorretta Harp, MD;  Location[facility] CATH LAB;  Service: Cardiovascular;  Laterality: N/A;  . PERCUTANEOUS CORONARY ROTOBLATOR INTERVENTION (PCI-R) N/A 01/16/2014   Procedure: PERCUTANEOUS CORONARY ROTOBLATOR INTERVENTION (PCI-R);  Surgeon: Troy Sine, MD;  Location: [facility] CATH LAB;  Service: Cardiovascular;  Laterality: N/A;       Home Medications    Prior to Admission medications   Medication Sig Start Date End Date Taking? Authorizing Provider  aspirin EC 81 MG EC tablet Take 1 tablet (81 mg total) by mouth daily. 01/17/14  Yes Eileen Stanford, PA-C  B Complex Vitamins (VITAMIN B COMPLEX PO) Take 1 tablet by mouth every evening.   Yes [provider]  captopril (CAPOTEN) 12.5 MG tablet Take 12.5 mg by mouth at bedtime.  11/05/13  Yes [provider]  carbidopa-levodopa (SINEMET IR) 25-100 MG tablet 2 in the morning, 2 in the afternoon, 1 in the evening 06/04/16  Yes Tat, Eustace Quail, DO  clopidogrel (PLAVIX) 75 MG tablet Take 1 tablet (75 mg  total) by mouth daily with breakfast. 01/17/14  Yes Eileen Stanford, PA-C  co-enzyme Q-10 30 MG capsule Take 30 mg by mouth daily.    Yes [provider]  desipramine (NORPRAMIN) 50 MG tablet Take 100 mg by mouth at bedtime.   Yes [provider]  finasteride (PROSCAR) 5 MG tablet Take 5 mg by mouth daily. 05/25/13  Yes [provider]  glimepiride (AMARYL) 2 MG tablet TK 2 T PO QHS 09/13/16  Yes [provider]  Glucosamine-Chondroitin-Vit D3 1500-1200-800  MG-MG-UNIT PACK Take 2 tablets by mouth daily.   Yes [provider]  LORazepam (ATIVAN) 0.5 MG tablet Take 0.5 mg by mouth 2 (two) times daily.    Yes [provider]  metoprolol (LOPRESSOR) 25 MG tablet Take 1 tablet (25 mg total) by mouth 2 (two) times daily. 01/17/14  Yes Eileen Stanford, PA-C  Multiple Vitamins-Minerals (ICAPS) CAPS Take 2 capsules by mouth daily.   Yes [provider]  Omega-3 Fatty Acids (FISH OIL) 1000 MG CAPS Take 1,000 mg by mouth daily.   Yes [provider]  simvastatin (ZOCOR) 80 MG tablet Take 80 mg by mouth every evening.    Yes [provider]  vitamin B-12 (CYANOCOBALAMIN) 500 MCG tablet Take 500 mcg by mouth daily.   Yes [provider]  vitamin C (ASCORBIC ACID) 500 MG tablet Take 500 mg by mouth daily.   Yes [provider]  vitamin E 100 UNIT capsule Take 400 Units by mouth daily.    Yes [provider]  lactulose (CHRONULAC) 10 GM/15ML solution Take 45 mLs (30 g total) by mouth daily. 11/11/16 11/13/16  Gareth Morgan, MD  nitroGLYCERIN (NITROSTAT) 0.4 MG SL tablet Place 1 tablet (0.4 mg total) under the tongue every 5 (five) minutes x 3 doses as needed for chest pain. 01/17/14   Eileen Stanford, PA-C  polyethylene glycol [facility] / GLYCOLAX) packet Take 17 g by mouth daily as needed for mild constipation.     [provider]  Sennosides-Docusate Sodium (STOOL SOFTENER & LAXATIVE PO) Take 2 tablets by mouth daily as needed (constipation).     [provider]    Family History Family History  Problem Relation Age of Onset  . Heart attack Mother   . Heart disease Father   . Lung cancer Sister   . COPD Sister     Social History Social History  Substance Use Topics  . Smoking status: Former Smoker    Packs/day: 1.50    Types: Cigarettes, Cigars    Quit date: 05/07/2013  . Smokeless tobacco: Never Used     Comment: quit cigarettes 1985 1.5 PPD, quit cigars  05/2013-- 5 cigars per day  . Alcohol use No     Allergies   Patient has no known allergies.   Review of Systems Review of Systems  Constitutional: Negative for fever.  HENT: Negative for sore throat.   Eyes: Negative for visual disturbance.  Respiratory: Negative for shortness of breath.   Cardiovascular: Negative for chest pain.  Gastrointestinal: Positive for abdominal pain and constipation. Negative for diarrhea, nausea and vomiting.  Genitourinary: Positive for difficulty urinating. Negative for dysuria.  Musculoskeletal: Negative for back pain and neck stiffness.  Skin: Negative for rash.  Neurological: Negative for syncope and headaches.     Physical Exam Updated Vital Signs BP (!) 160/79   Pulse 72   Temp 98.2 F (36.8 C) (Axillary)   Resp 18   SpO2 97%  Physical Exam  Constitutional: He is oriented to person, place, and time. He appears well-developed and well-nourished. No distress.  HENT:  Head: Normocephalic and atraumatic.  Eyes: Conjunctivae and EOM are normal.  Neck: Normal range of motion.  Cardiovascular: Normal rate, regular rhythm and intact distal pulses.  Exam reveals no gallop and no friction rub.   Murmur heard. Pulmonary/Chest: Effort normal and breath sounds normal. No respiratory distress. He has no wheezes. He has no rales.  Abdominal: Soft. He exhibits no distension. There is tenderness (RLQ). There is no guarding.  Musculoskeletal: He exhibits no edema.  Neurological: He is alert and oriented to person, place, and time.  Skin: Skin is warm and dry. He is not diaphoretic.  Nursing note and vitals reviewed.    ED Treatments / Results  Labs (all labs ordered are listed, but only abnormal results are displayed) Labs Reviewed  LIPASE, BLOOD - Abnormal; Notable for the following:       Result Value   Lipase 52 (*)    All other components within normal limits  COMPREHENSIVE METABOLIC PANEL - Abnormal; Notable for the following:     Glucose, Bld 152 (*)    BUN 46 (*)    Creatinine, Ser 2.78 (*)    ALT <5 (*)    Total Bilirubin 1.7 (*)    GFR calc non Af Amer 19 (*)    GFR calc Af Amer 22 (*)    All other components within normal limits  CBC - Abnormal; Notable for the following:    RBC 4.18 (*)    Hemoglobin 12.8 (*)    HCT 38.4 (*)    All other components within normal limits  URINALYSIS, ROUTINE W REFLEX MICROSCOPIC - Abnormal; Notable for the following:    APPearance HAZY (*)    Glucose, UA 50 (*)    Ketones, ur 5 (*)    Protein, ur 100 (*)    Bacteria, UA RARE (*)    Squamous Epithelial / LPF 0-5 (*)    All other components within normal limits    EKG  EKG Interpretation None       Radiology Ct Abdomen Pelvis Wo Contrast  Result Date: 11/11/2016 CLINICAL DATA:  Patient with lower abdominal pain.  Constipation. EXAM: CT ABDOMEN AND PELVIS WITHOUT CONTRAST TECHNIQUE: Multidetector CT imaging of the abdomen and pelvis was performed following the standard protocol without IV contrast. COMPARISON:  Abdominal radiograph 07/23/2015 FINDINGS: Lower chest: Normal heart size. Subpleural ground-glass and consolidative opacities within the lower lobes bilaterally favored represent atelectasis. No pleural effusion. Hepatobiliary: Liver is diffusely low in attenuation compatible with steatosis. Small amount of stones within the gallbladder lumen. No gallbladder wall thickening or pericholecystic fluid. Pancreas: Mild atrophy. Spleen: Unremarkable Adrenals/Urinary Tract: The adrenal glands are normal. Extrarenal pelvis bilaterally. Foley catheter within the urinary bladder lumen. Gas within the urinary bladder lumen. Stomach/Bowel: Large amount of stool throughout the colon with a large stool burden demonstrated within the rectum. The appendix is normal. There is irregular wall thickening of the cecum (image 43; series 3). Small hiatal hernia. Normal morphology of the stomach. Vascular/Lymphatic: Normal caliber abdominal  aorta. Peripheral calcified atherosclerotic plaque. No retroperitoneal lymphadenopathy. Reproductive: Prostate is unremarkable. Other: Bilateral fat containing inguinal hernias. Musculoskeletal: Lumbar spine degenerative changes. No aggressive or acute appearing osseous lesions. IMPRESSION: Suggestion of irregular wall thickening of the cecum without surrounding fat stranding. Findings are nonspecific however underlying colonic mass is not excluded. Recommend evaluation of the colon with colonoscopy after  resolution of the acute symptomatology. Large amount of stool throughout the colon as be seen with constipation. There is a large amount of stool which appears impacted within the rectum. Aortic atherosclerosis. Hepatic steatosis. Electronically Signed   By: Lovey Newcomer M.D.   On: 11/11/2016 20:02    Procedures Procedures (including critical care time)  Medications Ordered in ED Medications - No data to display   Initial Impression / Assessment and Plan / ED Course  I have reviewed the triage vital signs and the nursing notes.  Pertinent labs & imaging results that were available during my care of the patient were reviewed by me and considered in my medical decision making (see chart for details).     83 year old male with a history of diabetes, hypertension, hyperlipidemia, mitral regurgitation, COPD, chronic kidney disease presents with concern for abdominal pain, urinary retention and constipation. Patient with significant urinary retention on arrival to the emergency department, with over a liter of urine in his bladder. Foley catheter was placed with improvement. After catheter drainage, patient reports continued right lower quadrant tenderness on exam. CT abdomen and pelvis was done to evaluate for obstruction or other pathology. CT shows thickening of the cecum, concerning for possible neoplastic process, as well as large stool burden without signs of obstruction. Urinalysis is no sign of  infection. Patient with history of chronic kidney disease, and given he had reported prior discussions of dialysis with his kidney doctor given severity, doubt a significant change in his renal function today.  Discussed findings on CT with patient and family, and recommend follow-up with gastroenterologist. Offered patient disimpaction given impacted stool on CT, however he declines and would like to try home enema and other oral medications. He reports she's had some relief with lactulose in the past, was given prescription for this. Recommended MiraLAX 4-6 caps in a 32 Ounce Bottle of Gatorade, Continued Hydration.   Recommend follow-up with urology for removal of Foley catheter.  Final Clinical Impressions(s) / ED Diagnoses   Final diagnoses:  Right lower quadrant abdominal pain  Urinary retention  Constipation, unspecified constipation type    New Prescriptions Discharge Medication List as of 11/11/2016  9:39 PM    START taking these medications   Details  lactulose (CHRONULAC) 10 GM/15ML solution Take 45 mLs (30 g total) by mouth daily., Starting Thu 11/11/2016, Until Sat 11/13/2016, Print         Gareth Morgan, MD 11/12/16 [phone]

## 2016-11-11 NOTE — ED Triage Notes (Signed)
Patient here from home with complaints of lower abominal pain. Pain 10/10. Also reports constipation and urinary rentention. Last bowel movement Monday and was able to urinate a little early this morning.

## 2016-11-18 ENCOUNTER — Telehealth: Payer: Self-pay | Admitting: *Deleted

## 2016-11-18 NOTE — Telephone Encounter (Signed)
Requesting surgical clearance:  1. Type of surgery: excision of left gluteal SQ mass under general anesthesia  2. Surgeon: Dr. Michael Boston  3.Surgical Date:TBD  4. Medications that need to be held: ASA, Plavix   5. CAD: Yes  6. I will defer to:  Dr. Pearla Dubonnet Information: [facility] Fax # [phone] Attention: Illene Regulus, CMA or Arlean Hopping, [facility]  Phone # [phone]

## 2016-11-21 NOTE — Telephone Encounter (Signed)
Okay to interrupt antiplatelet therapy for his upcoming procedure. Hold 7 days prior to procedure and restart after procedure.

## 2016-11-22 ENCOUNTER — Other Ambulatory Visit: Payer: Self-pay | Admitting: Neurology

## 2016-11-22 ENCOUNTER — Other Ambulatory Visit: Payer: Self-pay | Admitting: Gastroenterology

## 2016-11-22 MED ORDER — CARBIDOPA-LEVODOPA 25-100 MG PO TABS: ORAL_TABLET | ORAL | 1 refills | Status: DC

## 2016-11-22 NOTE — Telephone Encounter (Signed)
Clearance routed to number provided via EPIC. 

## 2016-11-26 ENCOUNTER — Telehealth: Payer: Self-pay | Admitting: Cardiovascular Disease

## 2016-11-26 NOTE — Telephone Encounter (Signed)
Requesting surgical clearance:  1. Type of surgery: Colonoscopy  2. Surgeon: Dr. Michail Sermon  3.Surgical Date:  12/06/16  4. Medications that need to be held: Plavix--5 days prior to prcedure   5. CAD: Yes  6. I will defer to:  Dr. Pearla Dubonnet Information:  [facility] Phone:  [phone] Fax: [phone]

## 2016-11-28 NOTE — Telephone Encounter (Signed)
OK to interrupt anti-platelet Rx for colonoscopy 

## 2016-11-30 NOTE — Telephone Encounter (Signed)
Routed to number provided via EPIC. 

## 2016-12-01 ENCOUNTER — Other Ambulatory Visit: Payer: Self-pay | Admitting: Gastroenterology

## 2016-12-03 ENCOUNTER — Encounter (HOSPITAL_COMMUNITY): Payer: Self-pay | Admitting: *Deleted

## 2016-12-03 NOTE — Progress Notes (Signed)
Pt denies SOB and chest pain but is under the care of Dr. Gwenlyn Found, Cardiology. Pt stated that last dose of Plavix was Tuesday as instructed. Pt made aware to stop taking vitamins, fish oil, CO Q 10, Glucosamine and herbal medications. Do not take any NSAIDs ie: Ibuprofen, Advil, Naproxen (Aleve), Motrin, BC and Goody Powder. Pt made aware to not take Glimepiride the night before surgery or the morning of surgery. Pt made aware to check BG every 2 hours prior to arrival to hospital on DOS. Pt made aware to treat a BG < 70 with  4 ounces of apple or cranberry juice, wait 15 minutes after intervention to recheck BG, if BG remains < 70, call the Endo unit to speak with a nurse. Pt verbalized understanding of all pre-op instructions.

## 2016-12-03 NOTE — Progress Notes (Signed)
Dr. Linna Caprice, Anesthesia, reviewed pt cardiac notes; no new orders given.

## 2016-12-06 ENCOUNTER — Encounter (HOSPITAL_COMMUNITY): Payer: Self-pay | Admitting: *Deleted

## 2016-12-06 ENCOUNTER — Ambulatory Visit (HOSPITAL_COMMUNITY): Payer: Medicare Other | Admitting: Certified Registered"

## 2016-12-06 ENCOUNTER — Encounter (HOSPITAL_COMMUNITY)
Admission: RE | Disposition: A | Discharge status: Home / Self Care | Payer: Self-pay | Source: Ambulatory Visit | Attending: Gastroenterology

## 2016-12-06 ENCOUNTER — Ambulatory Visit (HOSPITAL_COMMUNITY)
Admission: RE | Admit: 2016-12-06 | Discharge: 2016-12-06 | Disposition: A | Discharge status: Home / Self Care | Payer: Medicare Other | Source: Ambulatory Visit | Attending: Gastroenterology | Admitting: Gastroenterology

## 2016-12-06 DIAGNOSIS — J449 Chronic obstructive pulmonary disease, unspecified: Secondary | ICD-10-CM | POA: Insufficient documentation

## 2016-12-06 DIAGNOSIS — K5900 Constipation, unspecified: Secondary | ICD-10-CM | POA: Diagnosis present

## 2016-12-06 DIAGNOSIS — Z7982 Long term (current) use of aspirin: Secondary | ICD-10-CM | POA: Diagnosis not present

## 2016-12-06 DIAGNOSIS — D120 Benign neoplasm of cecum: Secondary | ICD-10-CM | POA: Diagnosis not present

## 2016-12-06 DIAGNOSIS — E1122 Type 2 diabetes mellitus with diabetic chronic kidney disease: Secondary | ICD-10-CM | POA: Insufficient documentation

## 2016-12-06 DIAGNOSIS — Z87891 Personal history of nicotine dependence: Secondary | ICD-10-CM | POA: Insufficient documentation

## 2016-12-06 DIAGNOSIS — K5730 Diverticulosis of large intestine without perforation or abscess without bleeding: Secondary | ICD-10-CM | POA: Diagnosis not present

## 2016-12-06 DIAGNOSIS — R194 Change in bowel habit: Secondary | ICD-10-CM | POA: Diagnosis present

## 2016-12-06 DIAGNOSIS — E785 Hyperlipidemia, unspecified: Secondary | ICD-10-CM | POA: Insufficient documentation

## 2016-12-06 DIAGNOSIS — K219 Gastro-esophageal reflux disease without esophagitis: Secondary | ICD-10-CM | POA: Insufficient documentation

## 2016-12-06 DIAGNOSIS — I340 Nonrheumatic mitral (valve) insufficiency: Secondary | ICD-10-CM | POA: Diagnosis not present

## 2016-12-06 DIAGNOSIS — N183 Chronic kidney disease, stage 3 (moderate): Secondary | ICD-10-CM | POA: Insufficient documentation

## 2016-12-06 DIAGNOSIS — I25110 Atherosclerotic heart disease of native coronary artery with unstable angina pectoris: Secondary | ICD-10-CM | POA: Insufficient documentation

## 2016-12-06 DIAGNOSIS — K640 First degree hemorrhoids: Secondary | ICD-10-CM | POA: Diagnosis not present

## 2016-12-06 DIAGNOSIS — Z8249 Family history of ischemic heart disease and other diseases of the circulatory system: Secondary | ICD-10-CM | POA: Diagnosis not present

## 2016-12-06 DIAGNOSIS — Z801 Family history of malignant neoplasm of trachea, bronchus and lung: Secondary | ICD-10-CM | POA: Insufficient documentation

## 2016-12-06 DIAGNOSIS — F329 Major depressive disorder, single episode, unspecified: Secondary | ICD-10-CM | POA: Insufficient documentation

## 2016-12-06 DIAGNOSIS — I129 Hypertensive chronic kidney disease with stage 1 through stage 4 chronic kidney disease, or unspecified chronic kidney disease: Secondary | ICD-10-CM | POA: Insufficient documentation

## 2016-12-06 DIAGNOSIS — D125 Benign neoplasm of sigmoid colon: Secondary | ICD-10-CM | POA: Insufficient documentation

## 2016-12-06 DIAGNOSIS — Z825 Family history of asthma and other chronic lower respiratory diseases: Secondary | ICD-10-CM | POA: Diagnosis not present

## 2016-12-06 DIAGNOSIS — R935 Abnormal findings on diagnostic imaging of other abdominal regions, including retroperitoneum: Secondary | ICD-10-CM | POA: Diagnosis present

## 2016-12-06 DIAGNOSIS — Z79899 Other long term (current) drug therapy: Secondary | ICD-10-CM | POA: Insufficient documentation

## 2016-12-06 DIAGNOSIS — R001 Bradycardia, unspecified: Secondary | ICD-10-CM | POA: Diagnosis not present

## 2016-12-06 HISTORY — PX: COLONOSCOPY WITH PROPOFOL: SHX5780

## 2016-12-06 HISTORY — DX: Presence of spectacles and contact lenses: Z97.3

## 2016-12-06 HISTORY — DX: Unspecified osteoarthritis, unspecified site: M19.90

## 2016-12-06 LAB — GLUCOSE, CAPILLARY: GLUCOSE-CAPILLARY: 100 mg/dL — AB (ref 65–99)

## 2016-12-06 SURGERY — COLONOSCOPY WITH PROPOFOL
Anesthesia: Monitor Anesthesia Care

## 2016-12-06 MED ORDER — SPOT INK MARKER SYRINGE KIT
PACK | SUBMUCOSAL | Status: AC
  Filled 2016-12-06: qty 5

## 2016-12-06 MED ORDER — SODIUM CHLORIDE 0.9 % IV SOLN
INTRAVENOUS | Status: DC
  Administered 2016-12-06 (×2): via INTRAVENOUS

## 2016-12-06 MED ORDER — PROPOFOL 500 MG/50ML IV EMUL
INTRAVENOUS | Status: DC | PRN
  Administered 2016-12-06: 125 ug/kg/min via INTRAVENOUS

## 2016-12-06 MED ORDER — PHENYLEPHRINE HCL 10 MG/ML IJ SOLN
INTRAMUSCULAR | Status: DC | PRN
  Administered 2016-12-06: 120 ug via INTRAVENOUS
  Administered 2016-12-06: 80 ug via INTRAVENOUS
  Administered 2016-12-06: 160 ug via INTRAVENOUS

## 2016-12-06 MED ORDER — SODIUM CHLORIDE 0.9 % IV SOLN: INTRAVENOUS | Status: DC

## 2016-12-06 MED ORDER — LIDOCAINE HCL (CARDIAC) 20 MG/ML IV SOLN
INTRAVENOUS | Status: DC | PRN
  Administered 2016-12-06: 100 mg via INTRATRACHEAL

## 2016-12-06 MED ORDER — SPOT INK MARKER SYRINGE KIT
PACK | SUBMUCOSAL | Status: DC | PRN
  Administered 2016-12-06: 2 mL via SUBMUCOSAL

## 2016-12-06 MED ORDER — EPHEDRINE SULFATE 50 MG/ML IJ SOLN
INTRAMUSCULAR | Status: DC | PRN
  Administered 2016-12-06: 5 mg via INTRAVENOUS
  Administered 2016-12-06: 15 mg via INTRAVENOUS
  Administered 2016-12-06: 10 mg via INTRAVENOUS

## 2016-12-06 MED ORDER — PROPOFOL 10 MG/ML IV BOLUS
INTRAVENOUS | Status: DC | PRN
  Administered 2016-12-06: 30 mg via INTRAVENOUS

## 2016-12-06 SURGICAL SUPPLY — 22 items

## 2016-12-06 NOTE — Transfer of Care (Signed)
Immediate Anesthesia Transfer of Care Note  Patient: Ronald Benitez  Procedure(s) Performed: Procedure(s): COLONOSCOPY WITH PROPOFOL (N/A)  Patient Location: Endoscopy Unit  Anesthesia Type:MAC  Level of Consciousness: sedated and patient cooperative  Airway & Oxygen Therapy: Patient Spontanous Breathing  Post-op Assessment: Report given to RN and Post -op Vital signs reviewed and stable  Post vital signs: Reviewed and stable  Last Vitals:  Vitals:   12/06/16 0925  BP: (!) 176/72  Pulse: 68  Resp: 14  Temp: 36.6 C    Last Pain:  Vitals:   12/06/16 0925  TempSrc: Oral         Complications: No apparent anesthesia complications

## 2016-12-06 NOTE — H&P (Signed)
Date of Initial H&P: 11/22/16  History reviewed, patient examined, no change in status, stable for surgery.

## 2016-12-06 NOTE — Op Note (Signed)
[facility] Patient Name: Ronald Benitez Procedure Date : 12/06/2016 MRN: [id number] Attending MD: Lear Ng , MD Date of Birth: [birth year] CSN: [id number] Age: 83 Admit Type: Outpatient Procedure:                Colonoscopy Indications:              Last colonoscopy: March 2017, Abnormal CT of the GI                            tract, Change in bowel habits, Constipation Providers:                Lear Ng, MD, Cleda Daub, RN, Lillie Fragmin, RN, Elspeth Cho Tech., Technician,                            Lance Coon, CRNA Referring MD:              Medicines:                Propofol per Anesthesia, Monitored Anesthesia Care Complications:            No immediate complications. Estimated Blood Loss:     Estimated blood loss was minimal. Procedure:                Pre-Anesthesia Assessment:                           - Prior to the procedure, a History and Physical                            was performed, and patient medications and                            allergies were reviewed. The patient's tolerance of                            previous anesthesia was also reviewed. The risks                            and benefits of the procedure and the sedation                            options and risks were discussed with the patient.                            All questions were answered, and informed consent                            was obtained. Prior Anticoagulants: The patient has                            taken Plavix (clopidogrel), last dose was 5 days  prior to procedure. ASA Grade Assessment: III - A                            patient with severe systemic disease. After                            reviewing the risks and benefits, the patient was                            deemed in satisfactory condition to undergo the                            procedure.  After obtaining informed consent, the colonoscope                            was passed under direct vision. Throughout the                            procedure, the patient's blood pressure, pulse, and                            oxygen saturations were monitored continuously. The                            EC-[id number] ([id number]) scope was introduced through                            the anus and advanced to the the cecum, identified                            by appendiceal orifice and ileocecal valve. The                            colonoscopy was performed with difficulty due to                            significant looping, a tortuous colon and fair                            prep. Successful completion of the procedure was                            aided by straightening and shortening the scope to                            obtain bowel loop reduction, applying abdominal                            pressure and lavage. The patient tolerated the                            procedure well. The quality of the bowel  preparation was fair. The ileocecal valve,                            appendiceal orifice, and rectum were photographed. Scope In: 10:30:44 AM Scope Out: 11:02:06 AM Scope Withdrawal Time: 0 hours 21 minutes 7 seconds  Total Procedure Duration: 0 hours 31 minutes 22 seconds  Findings:      The perianal and digital rectal examinations were normal.      A medium-sized polypoid lesion was found in the cecum. The lesion was       polypoid and multi-lobulated. No bleeding was present. Biopsies were       taken with a cold forceps for histology. Estimated blood loss was       minimal.      A medium-sized polypoid lesion was found at the ileocecal valve. The       lesion was polypoid. No bleeding was present. Biopsies were taken with a       cold forceps for histology. Estimated blood loss was minimal.      A 20 mm polyp was found in the sigmoid colon. The  polyp was sessile.       Biopsies were taken with a cold forceps for histology. Estimated blood       loss was minimal. Area was tattooed with an injection of 2 mL of Spot       (carbon black).      Multiple small and large-mouthed diverticula were found in the sigmoid       colon.      Internal hemorrhoids were found during retroflexion. The hemorrhoids       were small and Grade I (internal hemorrhoids that do not prolapse). Impression:               - Preparation of the colon was fair.                           - Rule out malignancy, polypoid lesion in the                            cecum. Biopsied.                           - Rule out malignancy, polypoid lesion at the                            ileocecal valve. Biopsied.                           - One 20 mm polyp in the sigmoid colon. Biopsied.                            Tattooed.                           - Diverticulosis in the sigmoid colon.                           - Internal hemorrhoids. Moderate Sedation:      N/A - MAC procedure Recommendation:           -  Await pathology results.                           - Repeat colonoscopy is not recommended for                            surveillance.                           - Refer to a surgeon at appointment to be scheduled.                           - Resume Plavix (clopidogrel) at prior dose in 3                            days.                           - High fiber diet.                           - Patient has a contact number available for                            emergencies. The signs and symptoms of potential                            delayed complications were discussed with the                            patient. Return to normal activities tomorrow.                            Written discharge instructions were provided to the                            patient. Procedure Code(s):        --- Professional ---                           [phone], Colonoscopy, flexible;  with biopsy, single                            or multiple                           45381, Colonoscopy, flexible; with directed                            submucosal injection(s), any substance Diagnosis Code(s):        --- Professional ---                           R19.4, Change in bowel habit                           D12.5, Benign neoplasm of sigmoid colon  D49.0, Neoplasm of unspecified behavior of                            digestive system                           R93.3, Abnormal findings on diagnostic imaging of                            other parts of digestive tract                           K64.0, First degree hemorrhoids                           K59.00, Constipation, unspecified                           K57.30, Diverticulosis of large intestine without                            perforation or abscess without bleeding CPT copyright 2016 [employer]. All rights reserved. The codes documented in this report are preliminary and upon coder review may  be revised to meet current compliance requirements. Lear Ng, MD 12/06/2016 11:24:18 AM This report has been signed electronically. Number of Addenda: 0

## 2016-12-06 NOTE — Anesthesia Preprocedure Evaluation (Signed)
Anesthesia Evaluation  Patient identified by MRN, date of birth, ID band Patient awake    Reviewed: Allergy & Precautions, NPO status , Patient's Chart, lab work & pertinent test results  Airway Mallampati: I       Dental no notable dental hx.    Pulmonary former smoker,    Pulmonary exam normal breath sounds clear to auscultation       Cardiovascular hypertension, Normal cardiovascular exam Rhythm:Regular Rate:Normal     Neuro/Psych    GI/Hepatic   Endo/Other  diabetes  Renal/GU      Musculoskeletal   Abdominal   Peds  Hematology   Anesthesia Other Findings Myocardial Perfusion Imaging  Order# [id number]  Ordering physician: Lorretta Harp, MD Study date: 12/12/13 Patient Information   Name MRN Description Ronald Benitez [id number] 83 y.o. Male Vitals   Height Weight BMI (Calculated) _0  (1.803 m) 182 lb (82.6 kg) 25.4 Study Highlights   [place] [address] [place] [address] [phone]  Cardiology Nuclear Med Study  Ronald Benitez is a 83 y.o. male     MRN : [id number]     DOB: 1929-04-06  Procedure Date: 12/12/2013  Nuclear Med Background Indication for Stress Test:  Stent Patency and Abnormal EKG History:  COPD and CAD;MI;STENT/PTCA--08/1995 AND 04/1996;Last NUC MPI on 04/16/2001-EF=64% Cardiac Risk Factors: Family History - CAD, History of Smoking, Hypertension, Lipids and NIDDM  Symptoms:  DOE, Fatigue and SOB   Nuclear Pre-Procedure  Caffeine/Decaff Intake:  1:00am NPO After: 11am  IV Site: R Forearm  IV 0.9% NS with Angio Cath:  22g Chest Size (in):  44"  IV Started by: Rolene Course, RN Height: 5' 11.5" (1.816 m)  Cup Size: n/a BMI:  Body mass index is 25.03 kg/(m^2). Weight:  182 lb (82.555 kg)  Tech Comments:  n/a   Nuclear Med Study  1 or 2 day study: 1 day  Stress Test Type:   Dobutamine Order Authorizing Provider:  Quay Burow, MD  Resting Radionuclide: Technetium 81mSestamibi  Resting Radionuclide Dose: 10.8 mCi  Stress Radionuclide:  Technetium 961mestamibi  Stress Radionuclide Dose: 30.2 mCi          Stress Protocol  Rest HR: 66 Stress HR: 115 Rest BP: 165/76 Stress BP: 165/76 Exercise Time (min): n/a METS: n/a    Dose of Adenosine (mg):  n/a Dose of Lexiscan: n/a mg Dose of Atropine (mg): n/a Dose of Dobutamine: n/a mcg/kg/min (at max HR) Stress Test Technologist: GwMellody MemosCCT Nuclear Technologist: PaImagene RichesCNMT  Rest Procedure:  Myocardial perfusion imaging was performed at rest 45 minutes following the intravenous administration of Technetium 9933mstamibi. Stress Procedure:  The patient received IV dobutamine and no IV atropine.  There were no significant changes with infusion.  Technetium 55m24mtamibi was injected IV at peak heart rate and quantitative spect images were obtained after a 45 minute delay.  Transient Ischemic Dilatation (Normal <1.22):  1.09  QGS EDV:  126 ml QGS ESV:  66 ml LV Ejection Fraction: 48%  Rest ECG: NSR-RBBB  Stress ECG: No significant change from baseline ECG  QPS Raw Data Images:  Normal; no motion artifact; normal heart/lung ratio. Stress Images:  Decreased uptake in the high lateral and apical lateral wall Rest Images:  decreased apical uptake Subtraction (SDS):  SDS 8, Extent 21%  Impression Exercise Capacity:  Dobutamine study with no exercise. BP Response:  Normal blood pressure response. Clinical Symptoms:  No significant symptoms noted. ECG Impression:  No significant ST segment change suggestive of ischemia. Comparison with Prior Nuclear Study: Non-ischemic in 2002  Overall Impression:  Intermediate risk stress nuclear study with significant reversible lateral, apical lateral defect which may represent diagonal vessel ischemia.  LV Wall Motion:  Lateral hypokinesis,  LVEF 48%.  Pixie Casino, MD, [facility] Board Certified in Nuclear Cardiology Attending Cardiologist [place], MD  12/13/2013 1:02 PM         Imaging   Imaging Information Order-Level Documents - 12/12/2013:   Scan on 12/14/2013 8:07 AM by Miquel Dunn D : STRESS REPORTScan on 12/14/2013 8:07 AM by Miquel Dunn D : STRESS REPORT  Scan on 12/14/2013 8:08 AM by Miquel Dunn D : PROTOCOL AND DATA SHEETSScan on 12/14/2013 8:08 AM by Miquel Dunn D : PROTOCOL AND DATA SHEETS  Scan on 12/14/2013 8:10 AM by Miquel Dunn D : CONSENT FORMScan on 12/14/2013 8:10 AM by Miquel Dunn D : CONSENT FORM  Scan on 12/14/2013 8:11 AM by Miquel Dunn D : STRESS IMAGESScan on 12/14/2013 8:11 AM by Miquel Dunn D : STRESS IMAGES    Encounter-Level Documents - 12/12/2013:   Electronic signature on 12/12/2013 1:01 PM : [email or web address]  Electronic signature on 12/12/2013 1:01 PM : [email or web address]  Electronic signature on 12/12/2013 1:00 PM : [email or web address]    External Result Report   External Result Report Encounter Report   Patient Encounter Report  Balloon Angioplasty one month after the nuclear study took two lesions from 95% blocked to fully patent.  Reproductive/Obstetrics                             Anesthesia Physical Anesthesia Plan  ASA: IV  Anesthesia Plan: MAC   Post-op Pain Management:    Induction:   PONV Risk Score and Plan: 1 and Ondansetron, Dexamethasone and Treatment may vary due to age or medical condition  Airway Management Planned: Natural Airway, Nasal Cannula and Simple Face Mask  Additional Equipment:   Intra-op Plan:   Post-operative Plan:   Informed Consent: I have reviewed the patients History and Physical, chart, labs and discussed the procedure including the risks, benefits and alternatives for the proposed anesthesia with the patient or  authorized representative who has indicated his/her understanding and acceptance.     Plan Discussed with: CRNA and Surgeon  Anesthesia Plan Comments:         Anesthesia Quick Evaluation

## 2016-12-06 NOTE — Discharge Instructions (Signed)
Will call you when pathology results are complete. °

## 2016-12-06 NOTE — Anesthesia Procedure Notes (Signed)
Procedure Name: MAC Date/Time: 12/06/2016 10:24 AM Performed by: Lance Coon Pre-anesthesia Checklist: Patient identified, Emergency Drugs available, Suction available, Patient being monitored and Timeout performed Patient Re-evaluated:Patient Re-evaluated prior to inductionOxygen Delivery Method: Nasal cannula

## 2016-12-06 NOTE — Anesthesia Postprocedure Evaluation (Signed)
Anesthesia Post Note  Patient: Ronald Benitez  Procedure(s) Performed: Procedure(s) (LRB): COLONOSCOPY WITH PROPOFOL (N/A)     Patient location during evaluation: PACU Anesthesia Type: MAC Level of consciousness: awake Pain management: pain level controlled Vital Signs Assessment: post-procedure vital signs reviewed and stable Respiratory status: spontaneous breathing Cardiovascular status: stable Postop Assessment: no signs of nausea or vomiting Anesthetic complications: no    Last Vitals:  Vitals:   12/06/16 1112 12/06/16 1115  BP: (!) 85/35 (!) 120/45  Pulse: (!) 54 (!) 54  Resp: (!) 24 (!) 23  Temp:      Last Pain:  Vitals:   12/06/16 0925  TempSrc: Oral   Pain Goal:                 Roselynne Lortz Barron Alvine

## 2016-12-06 NOTE — Interval H&P Note (Signed)
History and Physical Interval Note:  12/06/2016 10:07 AM  Ronald Benitez  has presented today for surgery, with the diagnosis of abnormal CT of abdomen:constipation:change in bowel habits  The various methods of treatment have been discussed with the patient and family. After consideration of risks, benefits and other options for treatment, the patient has consented to  Procedure(s): COLONOSCOPY WITH PROPOFOL (N/A) as a surgical intervention .  The patient's history has been reviewed, patient examined, no change in status, stable for surgery.  I have reviewed the patient's chart and labs.  Questions were answered to the patient's satisfaction.     Dyess C.

## 2016-12-07 ENCOUNTER — Encounter (HOSPITAL_BASED_OUTPATIENT_CLINIC_OR_DEPARTMENT_OTHER): Payer: Self-pay | Admitting: *Deleted

## 2016-12-07 NOTE — Progress Notes (Addendum)
SPOKE W/ PT AND WIFE.  PT LITTLE HARD TO UNDERSTAND, HAS SPEECH IMPEDIMENT. SUGGEST HAVING WIFE IN PRE-OP.  NPO AFTER MN W/ EXCEPTION CLEAR LIQUIDS UNTIL 0930 (NO CREAM/ MILK PRODUCTS).  NEEDS ISTAT 8.  CURRENT EKG IN CHART AND EPIC.  WILL TAKE METOPROLOL, ATIVAN, AND SINEMET AM DOS W/ SIPS OF WATER.  WIFE VERBALIZED UNDERSTANDING FOR PT TO DO HIBICLENS SHOWER HS BEFORE AND AM DOS.   ADDENDUM:  REVIEWED CHART W/ DR ODDONO MDA , OK TO PROCEED.

## 2016-12-14 ENCOUNTER — Encounter (HOSPITAL_COMMUNITY): Payer: Self-pay | Admitting: *Deleted

## 2016-12-15 ENCOUNTER — Encounter (HOSPITAL_COMMUNITY): Payer: Self-pay | Admitting: *Deleted

## 2016-12-15 ENCOUNTER — Ambulatory Visit (HOSPITAL_COMMUNITY): Payer: Medicare Other | Admitting: Anesthesiology

## 2016-12-15 ENCOUNTER — Ambulatory Visit (HOSPITAL_BASED_OUTPATIENT_CLINIC_OR_DEPARTMENT_OTHER)
Admission: RE | Admit: 2016-12-15 | Discharge: 2016-12-15 | Disposition: A | Discharge status: Home / Self Care | Payer: Medicare Other | Source: Ambulatory Visit | Attending: Surgery | Admitting: Surgery

## 2016-12-15 ENCOUNTER — Encounter (HOSPITAL_COMMUNITY)
Admission: RE | Disposition: A | Discharge status: Home / Self Care | Payer: Self-pay | Source: Ambulatory Visit | Attending: Surgery

## 2016-12-15 DIAGNOSIS — N189 Chronic kidney disease, unspecified: Secondary | ICD-10-CM | POA: Diagnosis not present

## 2016-12-15 DIAGNOSIS — I25119 Atherosclerotic heart disease of native coronary artery with unspecified angina pectoris: Secondary | ICD-10-CM | POA: Insufficient documentation

## 2016-12-15 DIAGNOSIS — G20 Parkinson's disease: Secondary | ICD-10-CM | POA: Insufficient documentation

## 2016-12-15 DIAGNOSIS — K5909 Other constipation: Secondary | ICD-10-CM | POA: Diagnosis not present

## 2016-12-15 DIAGNOSIS — Z87891 Personal history of nicotine dependence: Secondary | ICD-10-CM | POA: Diagnosis not present

## 2016-12-15 DIAGNOSIS — I252 Old myocardial infarction: Secondary | ICD-10-CM | POA: Insufficient documentation

## 2016-12-15 DIAGNOSIS — Z79899 Other long term (current) drug therapy: Secondary | ICD-10-CM | POA: Insufficient documentation

## 2016-12-15 DIAGNOSIS — I10 Essential (primary) hypertension: Secondary | ICD-10-CM | POA: Diagnosis not present

## 2016-12-15 DIAGNOSIS — Z7984 Long term (current) use of oral hypoglycemic drugs: Secondary | ICD-10-CM | POA: Diagnosis not present

## 2016-12-15 DIAGNOSIS — Z7902 Long term (current) use of antithrombotics/antiplatelets: Secondary | ICD-10-CM | POA: Insufficient documentation

## 2016-12-15 DIAGNOSIS — J449 Chronic obstructive pulmonary disease, unspecified: Secondary | ICD-10-CM | POA: Insufficient documentation

## 2016-12-15 DIAGNOSIS — E119 Type 2 diabetes mellitus without complications: Secondary | ICD-10-CM | POA: Diagnosis not present

## 2016-12-15 DIAGNOSIS — Z7982 Long term (current) use of aspirin: Secondary | ICD-10-CM | POA: Diagnosis not present

## 2016-12-15 DIAGNOSIS — Z955 Presence of coronary angioplasty implant and graft: Secondary | ICD-10-CM | POA: Diagnosis not present

## 2016-12-15 DIAGNOSIS — L98498 Non-pressure chronic ulcer of skin of other sites with other specified severity: Secondary | ICD-10-CM | POA: Insufficient documentation

## 2016-12-15 DIAGNOSIS — F329 Major depressive disorder, single episode, unspecified: Secondary | ICD-10-CM | POA: Insufficient documentation

## 2016-12-15 DIAGNOSIS — R222 Localized swelling, mass and lump, trunk: Secondary | ICD-10-CM | POA: Diagnosis not present

## 2016-12-15 DIAGNOSIS — F419 Anxiety disorder, unspecified: Secondary | ICD-10-CM | POA: Insufficient documentation

## 2016-12-15 HISTORY — DX: Chronic kidney disease, stage 3 unspecified: N18.30

## 2016-12-15 HISTORY — DX: Diverticulosis of large intestine without perforation or abscess without bleeding: K57.30

## 2016-12-15 HISTORY — DX: Unspecified speech disturbances: R47.9

## 2016-12-15 HISTORY — DX: Parkinson's disease: G20

## 2016-12-15 HISTORY — DX: Parkinson's disease without dyskinesia, without mention of fluctuations: G20.A1

## 2016-12-15 HISTORY — DX: Chronic obstructive pulmonary disease, unspecified: J44.9

## 2016-12-15 HISTORY — DX: Sebaceous cyst: L72.3

## 2016-12-15 HISTORY — DX: Unsteadiness on feet: R26.81

## 2016-12-15 HISTORY — DX: Bifascicular block: I45.2

## 2016-12-15 HISTORY — DX: Dyspnea, unspecified: R06.00

## 2016-12-15 HISTORY — DX: Other skin changes: R23.8

## 2016-12-15 HISTORY — DX: Benign prostatic hyperplasia without lower urinary tract symptoms: N40.0

## 2016-12-15 HISTORY — DX: Personal history of adenomatous and serrated colon polyps: Z86.0101

## 2016-12-15 HISTORY — DX: Unspecified hearing loss, bilateral: H91.93

## 2016-12-15 HISTORY — DX: Personal history of colonic polyps: Z86.010

## 2016-12-15 HISTORY — DX: Type 2 diabetes mellitus without complications: E11.9

## 2016-12-15 HISTORY — DX: Spontaneous ecchymoses: R23.3

## 2016-12-15 HISTORY — DX: Diaphragmatic hernia without obstruction or gangrene: K44.9

## 2016-12-15 HISTORY — PX: MASS EXCISION: SHX2000

## 2016-12-15 HISTORY — DX: Presence of coronary angioplasty implant and graft: Z95.5

## 2016-12-15 HISTORY — DX: Old myocardial infarction: I25.2

## 2016-12-15 HISTORY — DX: Atrioventricular block, first degree: I44.0

## 2016-12-15 HISTORY — DX: Chronic kidney disease, stage 3 (moderate): N18.3

## 2016-12-15 LAB — BASIC METABOLIC PANEL
Anion gap: 9 (ref 5–15)
BUN: 27 mg/dL — AB (ref 6–20)
CHLORIDE: 107 mmol/L (ref 101–111)
CO2: 25 mmol/L (ref 22–32)
CREATININE: 2.11 mg/dL — AB (ref 0.61–1.24)
Calcium: 9.2 mg/dL (ref 8.9–10.3)
GFR calc Af Amer: 31 mL/min — ABNORMAL LOW (ref >60)
GFR calc non Af Amer: 27 mL/min — ABNORMAL LOW (ref >60)
Glucose, Bld: 128 mg/dL — ABNORMAL HIGH (ref 65–99)
Potassium: 3.9 mmol/L (ref 3.5–5.1)
Sodium: 141 mmol/L (ref 135–145)

## 2016-12-15 LAB — CBC
HCT: 36 % — ABNORMAL LOW (ref 39.0–52.0)
Hemoglobin: 11.9 g/dL — ABNORMAL LOW (ref 13.0–17.0)
MCH: 30.4 pg (ref 26.0–34.0)
MCHC: 33.1 g/dL (ref 30.0–36.0)
MCV: 91.8 fL (ref 78.0–100.0)
Platelets: 295 K/uL (ref 150–400)
RBC: 3.92 MIL/uL — ABNORMAL LOW (ref 4.22–5.81)
RDW: 14 % (ref 11.5–15.5)
WBC: 7.1 K/uL (ref 4.0–10.5)

## 2016-12-15 SURGERY — EXCISION MASS
Anesthesia: Monitor Anesthesia Care | Site: Coccyx | Laterality: Left

## 2016-12-15 MED ORDER — CHLORHEXIDINE GLUCONATE CLOTH 2 % EX PADS: 6.0000 | MEDICATED_PAD | Freq: Once | CUTANEOUS | Status: DC

## 2016-12-15 MED ORDER — FENTANYL CITRATE (PF) 250 MCG/5ML IJ SOLN
INTRAMUSCULAR | Status: DC | PRN
  Administered 2016-12-15: 75 ug via INTRAVENOUS
  Administered 2016-12-15: 25 ug via INTRAVENOUS

## 2016-12-15 MED ORDER — ACETAMINOPHEN 160 MG/5ML PO SOLN
325.0000 mg | ORAL | Status: DC | PRN
Start: 2016-12-15 — End: 2016-12-15

## 2016-12-15 MED ORDER — OXYCODONE HCL 5 MG/5ML PO SOLN: 5.0000 mg | Freq: Once | ORAL | Status: DC | PRN

## 2016-12-15 MED ORDER — MIDAZOLAM HCL 2 MG/2ML IJ SOLN
INTRAMUSCULAR | Status: AC
  Filled 2016-12-15: qty 2

## 2016-12-15 MED ORDER — 0.9 % SODIUM CHLORIDE (POUR BTL) OPTIME
TOPICAL | Status: DC | PRN
  Administered 2016-12-15: 1000 mL

## 2016-12-15 MED ORDER — PROPOFOL 10 MG/ML IV BOLUS
INTRAVENOUS | Status: DC | PRN
  Administered 2016-12-15: 120 mg via INTRAVENOUS

## 2016-12-15 MED ORDER — HYDROMORPHONE HCL-NACL 0.5-0.9 MG/ML-% IV SOSY: 0.2500 mg | PREFILLED_SYRINGE | INTRAVENOUS | Status: DC | PRN

## 2016-12-15 MED ORDER — ONDANSETRON HCL 4 MG/2ML IJ SOLN
INTRAMUSCULAR | Status: AC
  Filled 2016-12-15: qty 2

## 2016-12-15 MED ORDER — LACTATED RINGERS IV SOLN
INTRAVENOUS | Status: DC
  Administered 2016-12-15 (×2): via INTRAVENOUS

## 2016-12-15 MED ORDER — FENTANYL CITRATE (PF) 250 MCG/5ML IJ SOLN
INTRAMUSCULAR | Status: AC
Start: 2016-12-15 — End: ?
  Filled 2016-12-15: qty 5

## 2016-12-15 MED ORDER — ACETAMINOPHEN 500 MG PO TABS
1000.0000 mg | ORAL_TABLET | ORAL | Status: AC
  Administered 2016-12-15: 1000 mg via ORAL
  Filled 2016-12-15: qty 2

## 2016-12-15 MED ORDER — GABAPENTIN 300 MG PO CAPS
300.0000 mg | ORAL_CAPSULE | ORAL | Status: AC
  Administered 2016-12-15: 300 mg via ORAL
  Filled 2016-12-15: qty 1

## 2016-12-15 MED ORDER — HYDROMORPHONE HCL-NACL 0.5-0.9 MG/ML-% IV SOSY
PREFILLED_SYRINGE | INTRAVENOUS | Status: AC
  Filled 2016-12-15: qty 1

## 2016-12-15 MED ORDER — LIDOCAINE 2% (20 MG/ML) 5 ML SYRINGE
INTRAMUSCULAR | Status: DC | PRN
  Administered 2016-12-15: 60 mg via INTRAVENOUS

## 2016-12-15 MED ORDER — ONDANSETRON HCL 4 MG/2ML IJ SOLN: 4.0000 mg | Freq: Once | INTRAMUSCULAR | Status: DC | PRN

## 2016-12-15 MED ORDER — TRAMADOL HCL 50 MG PO TABS: 50.0000 mg | ORAL_TABLET | Freq: Four times a day (QID) | ORAL | 0 refills | Status: DC | PRN

## 2016-12-15 MED ORDER — CEFAZOLIN SODIUM-DEXTROSE 2-4 GM/100ML-% IV SOLN
2.0000 g | INTRAVENOUS | Status: AC
  Administered 2016-12-15: 2 g via INTRAVENOUS
  Filled 2016-12-15: qty 100

## 2016-12-15 MED ORDER — BUPIVACAINE-EPINEPHRINE 0.25% -1:200000 IJ SOLN
INTRAMUSCULAR | Status: DC | PRN
  Administered 2016-12-15: 30 mL

## 2016-12-15 MED ORDER — SUGAMMADEX SODIUM 200 MG/2ML IV SOLN
INTRAVENOUS | Status: DC | PRN
  Administered 2016-12-15: 175 mg via INTRAVENOUS

## 2016-12-15 MED ORDER — ROCURONIUM BROMIDE 10 MG/ML (PF) SYRINGE
PREFILLED_SYRINGE | INTRAVENOUS | Status: DC | PRN
  Administered 2016-12-15: 50 mg via INTRAVENOUS

## 2016-12-15 MED ORDER — BUPIVACAINE-EPINEPHRINE (PF) 0.25% -1:200000 IJ SOLN
INTRAMUSCULAR | Status: AC
  Filled 2016-12-15: qty 30

## 2016-12-15 MED ORDER — PROMETHAZINE HCL 25 MG/ML IJ SOLN: 6.2500 mg | INTRAMUSCULAR | Status: DC | PRN

## 2016-12-15 MED ORDER — OXYCODONE HCL 5 MG PO TABS: 5.0000 mg | ORAL_TABLET | Freq: Once | ORAL | Status: DC | PRN

## 2016-12-15 MED ORDER — ACETAMINOPHEN 325 MG PO TABS: 325.0000 mg | ORAL_TABLET | ORAL | Status: DC | PRN

## 2016-12-15 MED ORDER — ONDANSETRON HCL 4 MG/2ML IJ SOLN
INTRAMUSCULAR | Status: DC | PRN
  Administered 2016-12-15: 4 mg via INTRAVENOUS

## 2016-12-15 MED ORDER — PROPOFOL 10 MG/ML IV BOLUS
INTRAVENOUS | Status: AC
  Filled 2016-12-15: qty 20

## 2016-12-15 SURGICAL SUPPLY — 22 items
APL SKNCLS STERI-STRIP NONHPOA (GAUZE/BANDAGES/DRESSINGS)
BENZOIN TINCTURE PRP APPL 2/3 (GAUZE/BANDAGES/DRESSINGS) IMPLANT
CHLORAPREP W/TINT 26ML (MISCELLANEOUS) ×3 IMPLANT
COVER SURGICAL LIGHT HANDLE (MISCELLANEOUS) ×3 IMPLANT
DECANTER SPIKE VIAL GLASS SM (MISCELLANEOUS) ×2 IMPLANT
DRAPE LAPAROTOMY T 102X78X121 (DRAPES) IMPLANT
DRAPE LAPAROTOMY TRNSV 102X78 (DRAPE) ×2 IMPLANT
ELECT REM PT RETURN 15FT ADLT (MISCELLANEOUS) ×3 IMPLANT
GAUZE SPONGE 4X4 12PLY STRL (GAUZE/BANDAGES/DRESSINGS) IMPLANT
GLOVE ECLIPSE 8.0 STRL XLNG CF (GLOVE) ×3 IMPLANT
GLOVE INDICATOR 8.0 STRL GRN (GLOVE) ×3 IMPLANT
GOWN STRL REUS W/TWL XL LVL3 (GOWN DISPOSABLE) ×8 IMPLANT
KIT BASIN OR (CUSTOM PROCEDURE TRAY) ×3 IMPLANT
NEEDLE HYPO 22GX1.5 SAFETY (NEEDLE) ×3 IMPLANT
PACK GENERAL/GYN (CUSTOM PROCEDURE TRAY) ×3 IMPLANT
SPONGE LAP 4X18 X RAY DECT (DISPOSABLE) IMPLANT
SUT MNCRL AB 4-0 PS2 18 (SUTURE) IMPLANT
SUT VIC AB 2-0 SH 18 (SUTURE) ×2 IMPLANT
SUT VIC AB 3-0 SH 27 (SUTURE)
SUT VIC AB 3-0 SH 27XBRD (SUTURE) IMPLANT
SYR 20CC LL (SYRINGE) ×3 IMPLANT
TOWEL OR 17X26 10 PK STRL BLUE (TOWEL DISPOSABLE) ×3 IMPLANT

## 2016-12-15 NOTE — Anesthesia Postprocedure Evaluation (Signed)
Anesthesia Post Note  Patient: Ronald Benitez  Procedure(s) Performed: Procedure(s) (LRB): REMOVAL OF LEFT GLUTEAL SUBCUTANEOUS MASS (Left)     Patient location during evaluation: PACU Anesthesia Type: MAC Level of consciousness: awake and alert Pain management: pain level controlled Vital Signs Assessment: post-procedure vital signs reviewed and stable Respiratory status: spontaneous breathing, nonlabored ventilation and respiratory function stable Cardiovascular status: stable and blood pressure returned to baseline Anesthetic complications: no    Last Vitals:  Vitals:   12/15/16 1330 12/15/16 1355  BP: (!) 140/58 (!) (P) 141/50  Pulse: (!) 52 (!) (P) 50  Resp: 12 (P) 16  Temp: (!) 36.3 C     Last Pain:  Vitals:   12/15/16 1355  TempSrc:   PainSc: (P) 0-No pain                 Lynda Rainwater

## 2016-12-15 NOTE — Anesthesia Procedure Notes (Signed)
Date/Time: 12/15/2016 12:43 PM Performed by: Cynda Familia Oxygen Delivery Method: Simple face mask Comments: extubated good AW to PACU on O2

## 2016-12-15 NOTE — Discharge Instructions (Signed)
General Anesthesia, Adult, Care After These instructions provide you with information about caring for yourself after your procedure. Your health care provider may also give you more specific instructions. Your treatment has been planned according to current medical practices, but problems sometimes occur. Call your health care provider if you have any problems or questions after your procedure. What can I expect after the procedure? After the procedure, it is common to have:  Vomiting.  A sore throat.  Mental slowness.  It is common to feel:  Nauseous.  Cold or shivery.  Sleepy.  Tired.  Sore or achy, even in parts of your body where you did not have surgery.  Follow these instructions at home: For at least 24 hours after the procedure:  Do not: ? Participate in activities where you could fall or become injured. ? Drive. ? Use heavy machinery. ? Drink alcohol. ? Take sleeping pills or medicines that cause drowsiness. ? Make important decisions or sign legal documents. ? Take care of children on your own.  Rest. Eating and drinking  If you vomit, drink water, juice, or soup when you can drink without vomiting.  Drink enough fluid to keep your urine clear or pale yellow.  Make sure you have little or no nausea before eating solid foods.  Follow the diet recommended by your health care provider. General instructions  Have a responsible adult stay with you until you are awake and alert.  Return to your normal activities as told by your health care provider. Ask your health care provider what activities are safe for you.  Take over-the-counter and prescription medicines only as told by your health care provider.  If you smoke, do not smoke without supervision.  Keep all follow-up visits as told by your health care provider. This is important. Contact a health care provider if:  You continue to have nausea or vomiting at home, and medicines are not helpful.  You  cannot drink fluids or start eating again.  You cannot urinate after 8-12 hours.  You develop a skin rash.  You have fever.  You have increasing redness at the site of your procedure. Get help right away if:  You have difficulty breathing.  You have chest pain.  You have unexpected bleeding.  You feel that you are having a life-threatening or urgent problem. This information is not intended to replace advice given to you by your health care provider. Make sure you discuss any questions you have with your health care provider. Document Released: 08/30/2000 Document Revised: 10/27/2015 Document Reviewed: 05/08/2015 Elsevier Interactive Patient Education  2018 [place]: POST OP INSTRUCTIONS  ######################################################################  EAT Gradually transition to a high fiber diet with a fiber supplement over the next few weeks after discharge.  Start with a pureed / full liquid diet (see below)  WALK Walk an hour a day.  Control your pain to do that.    CONTROL PAIN Control pain so that you can walk, sleep, tolerate sneezing/coughing, go up/down stairs.  HAVE A BOWEL MOVEMENT DAILY Keep your bowels regular to avoid problems.  OK to try a laxative to override constipation.  OK to use an antidairrheal to slow down diarrhea.  Call if not better after 2 tries  CALL IF YOU HAVE PROBLEMS/CONCERNS Call if you are still struggling despite following these instructions. Call if you have concerns not answered by these instructions  ######################################################################    1. DIET: Follow a light bland diet the first 24 hours after arrival  home, such as soup, liquids, crackers, etc.  Be sure to include lots of fluids daily.  Avoid fast food or heavy meals as your are more likely to get nauseated.   2. Take your usually prescribed home medications unless otherwise directed. 3. PAIN CONTROL: a. Pain is  best controlled by a usual combination of three different methods TOGETHER: i. Ice/Heat ii. Over the counter pain medication iii. Prescription pain medication b. Most patients will experience some swelling and bruising around the incisions.  Ice packs or heating pads (30-60 minutes up to 6 times a day) will help. Use ice for the first few days to help decrease swelling and bruising, then switch to heat to help relax tight/sore spots and speed recovery.  Some people prefer to use ice alone, heat alone, alternating between ice & heat.  Experiment to what works for you.  Swelling and bruising can take several weeks to resolve.   c. It is helpful to take an over-the-counter pain medication regularly for the first few weeks.  Choose one of the following that works best for you: i. Naproxen (Aleve, etc)  Two 220mg  tabs twice a day ii. Ibuprofen (Advil, etc) Three 200mg  tabs four times a day (every meal & bedtime) iii. Acetaminophen (Tylenol, etc) 500-650mg  four times a day (every meal & bedtime) d. A  prescription for pain medication (such as oxycodone, hydrocodone, etc) should be given to you upon discharge.  Take your pain medication as prescribed.  i. If you are having problems/concerns with the prescription medicine (does not control pain, nausea, vomiting, rash, itching, etc), please call us [phone] to see if we need to switch you to a different pain medicine that will work better for you and/or control your side effect better. ii. If you need a refill on your pain medication, please contact your pharmacy.  They will contact our office to request authorization. Prescriptions will not be filled after 5 pm or on week-ends. 4. Avoid getting constipated.  Between the surgery and the pain medications, it is common to experience some constipation.  Increasing fluid intake and taking a fiber supplement (such as Metamucil, Citrucel, FiberCon, MiraLax, etc) 1-2 times a day regularly will usually help  prevent this problem from occurring.  A mild laxative (prune juice, Milk of Magnesia, MiraLax, etc) should be taken according to package directions if there are no bowel movements after 48 hours.    5. Wash / shower every day.  You may shower over the dressings as they are waterproof.  Continue to shower over incision(s) after the dressing is off.  6. Remove your outside bandages the next day after surgery.  If your incision was left open with a wound, see the separate "Wound Care Instructions."  If your incision ois closed, you may leave most of your incision open to air.  You have some purple Dermabond glue over the incision.  They will naturally fall off like a scab in the next week or so.  Replace a dressing/Band-Aid to cover the incisions every day.  Keep the area clean & dry.      7. ACTIVITIES as tolerated:   a. You may resume regular (light) daily activities beginning the next day--such as daily self-care, walking, climbing stairs--gradually increasing activities as tolerated.  If you can walk 30 minutes without difficulty, it is safe to try more intense activity such as jogging, treadmill, bicycling, low-impact aerobics, swimming, etc. b. Save the most intensive and strenuous activity for last such as sit-ups,  heavy lifting, contact sports, etc  Refrain from any heavy lifting or straining until you are off narcotics for pain control.   c. DO NOT PUSH THROUGH PAIN.  Let pain be your guide: If it hurts to do something, don't do it.  Pain is your body warning you to avoid that activity for another week until the pain goes down. d. You may drive when you are no longer taking prescription pain medication, you can comfortably wear a seatbelt, and you can safely maneuver your car and apply brakes. e. Dennis Bast may have sexual intercourse when it is comfortable.  8. FOLLOW UP in our office a. Please call [facility] at [phone] to set up an appointment to see your surgeon in the office for a follow-up  appointment approximately 7-10 days after your surgery to have one of your ribbon wicks removed by the office nurse.  The other remaining wick usually is removed the next week after that when you meet your surgeon again for a post-operative visit. b. Make sure that you call for these appointments the day you arrive home to ensure a convenient appointment time. 9. IF YOU HAVE DISABILITY OR FAMILY LEAVE FORMS, BRING THEM TO THE OFFICE FOR PROCESSING.  DO NOT GIVE THEM TO YOUR DOCTOR.   WHEN TO CALL us [phone]: 1. Poor pain control 2. Reactions / problems with new medications (rash/itching, nausea, etc)  3. Fever over 101.5 F (38.5 C) 4. Worsening swelling or bruising 5. Continued bleeding from incision. 6. Increased pain, redness, or drainage from the incision 7. Difficulty breathing / swallowing   The clinic staff is available to answer your questions during regular business hours (8:30am-5pm).  Please dont hesitate to call and ask to speak to one of our nurses for clinical concerns.   If you have a medical emergency, go to the nearest emergency room or call 911.  A surgeon from [facility] is always on call at the [facility], [place], [place], [place], [place]  [address] ? MAIN: [phone] ? TOLL FREE: [phone] ?  FAX [phone] [email or web address]

## 2016-12-15 NOTE — H&P (Signed)
Ronald Benitez 11/09/2016 11:35 AM Location: [facility] Patient #: [id number] DOB: [birth year] Married / Language: Cleophus Molt / Race: White Male   History of Present Illness [name] MD; 11/09/2016 12:53 PM) The patient is a 83 year old male who presents with a subcutaneous abscess. Note for "Subcutaneous abscess": ` ` ` Patient sent for surgical consultation at the request of Dr. Alesia Banda  Chief Complaint: Status post incision and drainage of gluteal abscess 10/04/2016  The patient is an elderly gentleman with Parkinson's disease control and Sinemet. Chronic kidney disease. Had abscess on his left inner gluteal cheek. Underwent incision and drainage April 30, now 6 weeks ago.   He returns for follow-up. He comes today with his wife. Still has pain on his tailbone. He still has the wound. It has not closed. Still annoying to sit on it. Occasional foul odor. Not worsening redness though. He still severely constipated. Takes a laxative to move his bowels once a week or so. No nausea or vomiting. No new lesions. Not on any antibiotics anymore.  (Review of systems as stated in this history (HPI) or in the review of systems. Otherwise all other 12 point ROS are negative)   Problem List/Past Medical [name], MD; 11/09/2016 11:39 AM) ABSCESS, GLUTEAL, LEFT (L02.31)  CHRONIC CONSTIPATION (K59.09)   Past Surgical History [name], MD; 11/09/2016 11:39 AM) Cataract Surgery  Bilateral. Colon Polyp Removal - Colonoscopy  Knee Surgery  Left. Tonsillectomy  Vasectomy   Diagnostic Studies History [name], MD; 11/09/2016 11:39 AM) Colonoscopy  within last year  Allergies (Yasmin Roberson, CMA; 11/09/2016 11:36 AM) No Known Allergies 10/04/2016 Allergies Reconciled   Medication History (Yasmin Roberson, CMA; 11/09/2016 11:36 AM) Simvastatin (80MG  Tablet, Oral) Active. Nitrofurantoin Macrocrystal (100MG  Capsule, Oral)  Active. Metoprolol Tartrate (25MG  Tablet, Oral) Active. Glimepiride (2MG  Tablet, Oral) Active. Furosemide (20MG  Tablet, Oral) Active. Desipramine HCl (50MG  Tablet, Oral) Active. LORazepam (0.5MG  Tablet, Oral) Active. Captopril (12.5MG  Tablet, Oral) Active. Carbidopa-Levodopa (25-100MG  Tablet, Oral) Active. Cephalexin (500MG  Capsule, Oral) Active. Medications Reconciled  Social History [name], MD; 11/09/2016 11:39 AM) No alcohol use  No caffeine use  Tobacco use  Former smoker.  Family History [name], MD; 11/09/2016 11:39 AM) Diabetes Mellitus  Father.  Other Problems [name], MD; 11/09/2016 11:39 AM) Anxiety Disorder  Gastroesophageal Reflux Disease  High blood pressure  Myocardial infarction     Review of Systems [name], MD; 11/09/2016 11:40 AM) Skin Present- New Lesions. Not Present- Change in Wart/Mole, Dryness, Hives, Jaundice, Non-Healing Wounds, Rash and Ulcer. Cardiovascular Present- Swelling of Extremities. Not Present- Chest Pain, Difficulty Breathing Lying Down, Leg Cramps, Palpitations, Rapid Heart Rate and Shortness of Breath. Gastrointestinal Present- Constipation and Hemorrhoids. Not Present- Abdominal Pain, Bloating, Bloody Stool, Change in Bowel Habits, Chronic diarrhea, Difficulty Swallowing, Excessive gas, Gets full quickly at meals, Indigestion, Nausea, Rectal Pain and Vomiting. Male Genitourinary Present- Frequency. Not Present- Blood in Urine, Change in Urinary Stream, Impotence, Nocturia, Painful Urination, Urgency and Urine Leakage.  Vitals (Yasmin Roberson CMA; 11/09/2016 11:36 AM) 11/09/2016 11:35 AM Weight: 171.2 lb Height: 71.5in Body Surface Area: 1.98 m Body Mass Index: 23.54 kg/m  Pulse: 54 (Regular)  BP: 124/66 (Sitting, Left Arm, Standard)       Physical Exam [name] MD; 11/09/2016 12:54 PM) General Mental Status-Alert. General Appearance-Not in acute  distress. Voice-Normal. Note: Thin. Somewhat mask face. Mumbling speech but mostly comprehensible.   Integumentary Global Assessment Normal Exam -  Distribution of scalp and body hair is normal. General Characteristics Overall Skin Surface - no rashes and no suspicious lesions.  Head and Neck Head-normocephalic, atraumatic with no lesions or palpable masses. Face Global Assessment - atraumatic, no absence of expression. Neck Global Assessment - no abnormal movements, no decreased range of motion. Trachea-midline. Thyroid Gland Characteristics - non-tender.  Eye Eyeball - Left-Extraocular movements intact, No Nystagmus. Eyeball - Right-Extraocular movements intact, No Nystagmus. Upper Eyelid - Left-No Cyanotic. Upper Eyelid - Right-No Cyanotic. Note: Wears glasses. Vision corrected   Chest and Lung Exam Inspection Accessory muscles - No use of accessory muscles in breathing.  Rectal Note: Left inner gluteal 30 x 30 mm nodule with 1 cm deep wound. Unchanged from last visit. No cellulitis. No deeper infection. Not consistent with sacral decubitus.  No perianal lesions. Mildly decreased but normal sphincter tone. No internal/external hemorrhoids. No other abscess. No sinus. No fissure. No anal fistula. No pilonidal disease. No pits. No hair burden.   Peripheral Vascular Upper Extremity Inspection - Left - Not Gangrenous, No Petechiae. Right - Not Gangrenous, No Petechiae.  Neurologic Neurologic evaluation reveals -normal attention span and ability to concentrate, able to name objects and repeat phrases. Appropriate fund of knowledge and normal coordination.  Neuropsychiatric Mental status exam performed with findings of-able to articulate well with normal speech/language, rate, volume and coherence and no evidence of hallucinations, delusions, obsessions or homicidal/suicidal ideation. Orientation-oriented  X3.  Musculoskeletal Global Assessment Gait and Station - normal gait and station.  Lymphatic General Lymphatics Description - No Generalized lymphadenopathy.    Assessment & Plan  ABSCESS, GLUTEAL, LEFT (L02.31) Impression: Abscess on left inner gluteal cheek. Cellulitis resolved but with persistent chronic wound not healing.  The wound has not healed any further. I didn't think it will heal unless it's more completely excised. Still really sensitive & uncomfortable. It seems mobile in the soft tissues, so I doubt a margins ulcer cancer. Nonetheless needs to be removed.  Hopefully can do this with some deep sedation in the OR and holding Plavix. Think it's too much of an area to try and deal with in the office alone. Would like to make sure it safe to come off since he's had prior coronary stenting. Last 2015 Current Plans You are being scheduled for surgery- Our schedulers will call you.  You should hear from our office's scheduling department within 5 working days about the location, date, and time of surgery. We try to make accommodations for patient's preferences in scheduling surgery, but sometimes the OR schedule or the surgeon's schedule prevents [place] from making those accommodations.  If you have not heard from our office [phone]) in 5 working days, call the office and ask for your surgeon's nurse.  If you have other questions about your diagnosis, plan, or surgery, call the office and ask for your surgeon's nurse.  The pathophysiology of skin & subcutaneous masses was discussed. Natural history risks without surgery were discussed. I recommended surgery to remove the mass. I explained the technique of removal with use of local anesthesia & possible need for more aggressive sedation/anesthesia for patient comfort.  Risks such as bleeding, infection, wound breakdown, heart attack, death, and other risks were discussed. I noted a good likelihood this will help address  the problem. Possibility that this will not correct all symptoms was explained. Possibility of regrowth/recurrence of the mass was discussed. We will work to minimize complications. Questions were answered. The patient expresses understanding & wishes to proceed with surgery.  Pt Education - [facility] Wound Care Instructions (Sladen Plancarte): discussed with patient and provided information. ANTICOAGULATED (Z79.01) Impression: Like to get input from his cardiologist, Dr. Alvester Chou, to make sure it safe to come off his Plavix perioperatively. Sounds like he was able to have it held for a dental procedure earlier earlier in the year without incident. Current Plans I recommended obtaining preoperative cardiac clearance. I am concerned about the health of the patient and the ability to tolerate the operation. Therefore, we will request clearance by cardiology to better assess operative risk & see if a reevaluation, further workup, etc is needed. Also recommendations on how medications such as for anticoagulation and blood pressure should be managed/held/restarted after surgery. Pt Education - [facility] Hold anticoagulation preoperatively CHRONIC CONSTIPATION (K59.09) Current Plans Pt Education - [facility] Constipation (AT) Pt Education - [facility] Good Bowel Health (Teaghan Melrose)  [name], M.D., F.A.C.S. Gastrointestinal and Minimally Invasive Surgery [facility] [address] [place], [place] [address] [phone] Main / Paging

## 2016-12-15 NOTE — Transfer of Care (Signed)
Immediate Anesthesia Transfer of Care Note  Patient: Ronald Benitez  Procedure(s) Performed: Procedure(s): REMOVAL OF LEFT GLUTEAL SUBCUTANEOUS MASS (Left)  Patient Location: PACU  Anesthesia Type:General  Level of Consciousness: sedated  Airway & Oxygen Therapy: Patient Spontanous Breathing and Patient connected to face mask oxygen  Post-op Assessment: Report given to RN and Post -op Vital signs reviewed and stable  Post vital signs: Reviewed and stable  Last Vitals:  Vitals:   12/15/16 0755  BP: 139/64  Pulse: 70  Resp: 18  Temp: 36.6 C    Last Pain:  Vitals:   12/15/16 0755  TempSrc: Oral      Patients Stated Pain Goal: 4 (32/02/33 4356)  Complications: No apparent anesthesia complications

## 2016-12-15 NOTE — Anesthesia Procedure Notes (Signed)
Procedure Name: Intubation Date/Time: 12/15/2016 11:39 AM Performed by: Cynda Familia Pre-anesthesia Checklist: Patient identified, Emergency Drugs available, Suction available and Patient being monitored Patient Re-evaluated:Patient Re-evaluated prior to induction Oxygen Delivery Method: Circle System Utilized Preoxygenation: Pre-oxygenation with 100% oxygen Induction Type: IV induction Ventilation: Mask ventilation without difficulty Laryngoscope Size: Miller and 2 Tube type: Oral Tube size: 7.5 mm Number of attempts: 1 Airway Equipment and Method: Stylet Placement Confirmation: ETT inserted through vocal cords under direct vision,  positive ETCO2 and breath sounds checked- equal and bilateral Secured at: 22 cm Tube secured with: Tape Dental Injury: Teeth and Oropharynx as per pre-operative assessment  Comments: Smooth IV induction Miller--- intubation AM CRNA atraumatic--- very poor dentition--- many chipped teeth--- teeth and mouth as preop--  bilat BS Sabra Heck

## 2016-12-15 NOTE — Op Note (Addendum)
12/15/2016  1:10 PM  PATIENT:  Ronald Benitez  83 y.o. male  Patient Care Team: [name], MD as PCP - General (Internal Medicine) Michael Boston, MD as Consulting Physician (General Surgery) Tat, Eustace Quail, DO as Consulting Physician (Neurology) Lorretta Harp, MD as Consulting Physician (Cardiology)  PRE-OPERATIVE DIAGNOSIS:  left fluteal subcutaneous mass, 3 cm x 3 cm   POST-OPERATIVE DIAGNOSIS:  left gluteal subcutaneous mass, 3 cm x 3 cm , right gluteal 1x1x1cm mass  PROCEDURE:  Procedure(s): REMOVAL OF LEFT & RIGHT GLUTEAL SUBCUTANEOUS MASSES  SURGEON:  [name], MD  ASSISTANT: RN   ANESTHESIA:   local and general  EBL:  Total I/O In: 1200 [I.V.:1200] Out: 25 [Blood:25]  Delay start of Pharmacological VTE agent (>24hrs) due to surgical blood loss or risk of bleeding:  no  DRAINS:  None  SPECIMEN:  left gluteal subcutaneous mass, 3 cm x 3 cm , right gluteal 1x1x1cm mass  DISPOSITION OF SPECIMEN:  PATHOLOGY  COUNTS:  YES  PLAN OF CARE: Discharge to home after PACU  PATIENT DISPOSITION:  PACU - hemodynamically stable.  INDICATION: Pleasant person with abscess on left gluteal region status post incision and drainage.  Wound persisted would not close down.  Suspicious for chronic sebaceous cyst versus marginal ulcer.  I recommended excision with layered closure:  The anatomy of the intragluteal cleft was discussed. Pathophysiology of pilonidal disease was discussed.  Discussion of options such as curretage, excision with closure vs leaving open was discussed. Risks of infection with need for incision and drainage & antibiotics were discussed.  I noted a good likelihood this will help address the problem.    At this point, I think the patient would best served with considering surgery to excise the diseased tissue. I will make an attempt to close but it may need to be left open to allow it to heal with secondary intention and wound packing.  Possible recurrences need reoperation or different techniques were discussed as well. I noted that recurrence is higher with poor compliance on hair removal hygiene & overall health.  Risks of bleeding, infection, injury to other organs, need for repair of tissues / organs, need for further treatment, and other risks discussed. The patient's questions were answered. The patient agrees to proceed.  OR FINDINGS: left gluteal subcutaneous mass, 3 cm x 3 cm , right gluteal 1x1x1cm mass.  Most likely chronically inflamed sebaceous cysts.  Layered closure with absorbable suture.    DESCRIPTION:   Informed consent was confirmed.  Patient underwent general anesthesia without difficulty.  He was positioned prone.  The lower back and intergluteal region was prepped and draped in the sterile fashion.  Excess air is removed.  Surgical timeout confirmed our plan.  I did examination with findings noted above.  I did a circular excision around the masses area came through the dermis and subcutaneous tissues.  Focused on the left greater than right lung.  Right primarily skin and dermis.  Left one going down to the coccyx but did not breach the periosteum.  Specimen removed intact.  I created deep flaps off the skin and subcutaneous tissue fascia and even superficial gluteus muscle off the sacrum a few centimeters on both sides to have good mobility for thick flaps.  Despite holding his Plavix, he tended to ooze quite easily.  Then she was focused cautery and pressure I was able to get excellent hemostasis.  I closed  wound with deep tissue and deep dermal interrupted  2-0 Vicryl suture to good resuIt.   closed both wounds radially in an oblique fashion.   I then closed the skin with a running Monocryl subcuticular suture.  I then used skin glue to seal the incisions.   Patient being extubated go to recovery room.  I had discussed postop care with the patient.  Patient had received instructions in the office.  Pain  prescription written.  I discussed operative findings, updated the patient's status, discussed probable steps to recovery, and gave postoperative recommendations to the patient's family.  Recommendations were made.  Questions were answered.  They had concerns from a recent colonoscopy that revealed some large polyps.  It was done last week.  I did printout pathology report & reviewed with them.    I had not heard from the gastroenterologist.  I will try to reach him.  They expressed understanding & appreciation.   [name], M.D., F.A.C.S. Gastrointestinal and Minimally Invasive Surgery [facility] [address], [place], [place] [id number] [phone] Main / Paging

## 2016-12-15 NOTE — Anesthesia Preprocedure Evaluation (Addendum)
Anesthesia Evaluation  Patient identified by MRN, date of birth, ID band Patient awake    Reviewed: Allergy & Precautions, NPO status , Patient's Chart, lab work & pertinent test results, reviewed documented beta blocker date and time   Airway Mallampati: I       Dental no notable dental hx. (+) Dental Advisory Given, Loose, Chipped, Missing   Pulmonary COPD, former smoker,    Pulmonary exam normal breath sounds clear to auscultation       Cardiovascular hypertension, Pt. on medications and Pt. on home beta blockers + angina + CAD  Normal cardiovascular exam+ dysrhythmias  Rhythm:Regular Rate:Normal     Neuro/Psych Depression    GI/Hepatic GERD  ,  Endo/Other  diabetes  Renal/GU Renal InsufficiencyRenal disease     Musculoskeletal  (+) Arthritis ,   Abdominal   Peds  Hematology   Anesthesia Other Findings Myocardial Perfusion Imaging  Order# [id number]  Ordering physician: Lorretta Harp, MD Study date: 12/12/13 Patient Information   Name MRN Description WILMER SANTILLO [id number] 83 y.o. Male Vitals   Height Weight BMI (Calculated) 5\' 11"  (1.803 m) 182 lb (82.6 kg) 25.4 Study Highlights   [facility] [address] [place] [address] [phone]  Cardiology Nuclear Med Study  SUJAY GRUNDMAN is a 83 y.o. male     MRN : [id number]     DOB: [birth year]  Procedure Date: 12/12/2013  Nuclear Med Background Indication for Stress Test:  Stent Patency and Abnormal EKG History:  COPD and CAD;MI;STENT/PTCA--08/1995 AND 04/1996;Last NUC MPI on 04/16/2001-EF=64% Cardiac Risk Factors: Family History - CAD, History of Smoking, Hypertension, Lipids and NIDDM  Symptoms:  DOE, Fatigue and SOB   Nuclear Pre-Procedure  Caffeine/Decaff Intake:  1:00am NPO After: 11am  IV Site: R Forearm  IV 0.9% NS with Angio Cath:  22g Chest Size (in):   44"  IV Started by: Rolene Course, RN Height: 5' 11.5" (1.816 m)  Cup Size: n/a BMI:  Body mass index is 25.03 kg/(m^2). Weight:  182 lb (82.555 kg)  Tech Comments:  n/a   Nuclear Med Study  1 or 2 day study: 1 day  Stress Test Type:  Dobutamine Order Authorizing Provider:  Quay Burow, MD  Resting Radionuclide: Technetium 70m Sestamibi  Resting Radionuclide Dose: 10.8 mCi  Stress Radionuclide:  Technetium 23m Sestamibi  Stress Radionuclide Dose: 30.2 mCi          Stress Protocol  Rest HR: 66 Stress HR: 115 Rest BP: 165/76 Stress BP: 165/76 Exercise Time (min): n/a METS: n/a    Dose of Adenosine (mg):  n/a Dose of Lexiscan: n/a mg Dose of Atropine (mg): n/a Dose of Dobutamine: n/a mcg/kg/min (at max HR) Stress Test Technologist: Mellody Memos, CCT Nuclear Technologist: Imagene Riches, CNMT  Rest Procedure:  Myocardial perfusion imaging was performed at rest 45 minutes following the intravenous administration of Technetium 48m Sestamibi. Stress Procedure:  The patient received IV dobutamine and no IV atropine.  There were no significant changes with infusion.  Technetium 16m Sestamibi was injected IV at peak heart rate and quantitative spect images were obtained after a 45 minute delay.  Transient Ischemic Dilatation (Normal <1.22):  1.09  QGS EDV:  126 ml QGS ESV:  66 ml LV Ejection Fraction: 48%  Rest ECG: NSR-RBBB  Stress ECG: No significant change from baseline ECG  QPS Raw Data Images:  Normal; no motion artifact; normal heart/lung ratio. Stress Images:  Decreased uptake in  the high lateral and apical lateral wall Rest Images:  decreased apical uptake Subtraction (SDS):  SDS 8, Extent 21%  Impression Exercise Capacity:  Dobutamine study with no exercise. BP Response:  Normal blood pressure response. Clinical Symptoms:  No significant symptoms noted. ECG Impression:  No significant ST segment change suggestive of ischemia. Comparison with Prior  Nuclear Study: Non-ischemic in 2002  Overall Impression:  Intermediate risk stress nuclear study with significant reversible lateral, apical lateral defect which may represent diagonal vessel ischemia.  LV Wall Motion:  Lateral hypokinesis, LVEF 48%.  Pixie Casino, MD, [facility] Board Certified in Nuclear Cardiology Attending Cardiologist [place], MD  12/13/2013 1:02 PM         Imaging   Imaging Information Order-Level Documents - 12/12/2013:   Scan on 12/14/2013 8:07 AM by Miquel Dunn D : STRESS REPORTScan on 12/14/2013 8:07 AM by Miquel Dunn D : STRESS REPORT  Scan on 12/14/2013 8:08 AM by Miquel Dunn D : PROTOCOL AND DATA SHEETSScan on 12/14/2013 8:08 AM by Miquel Dunn D : PROTOCOL AND DATA SHEETS  Scan on 12/14/2013 8:10 AM by Miquel Dunn D : CONSENT FORMScan on 12/14/2013 8:10 AM by Miquel Dunn D : CONSENT FORM  Scan on 12/14/2013 8:11 AM by Miquel Dunn D : STRESS IMAGESScan on 12/14/2013 8:11 AM by Miquel Dunn D : STRESS IMAGES    Encounter-Level Documents - 12/12/2013:   Electronic signature on 12/12/2013 1:01 PM : [email or web address]  Electronic signature on 12/12/2013 1:01 PM : [email or web address]  Electronic signature on 12/12/2013 1:00 PM : [email or web address]    External Result Report   External Result Report Encounter Report   Patient Encounter Report  Balloon Angioplasty one month after the nuclear study took two lesions from 95% blocked to fully patent.  Reproductive/Obstetrics                            Anesthesia Physical  Anesthesia Plan  ASA: IV  Anesthesia Plan: General   Post-op Pain Management:    Induction: Intravenous  PONV Risk Score and Plan: 1 and Ondansetron, Treatment may vary due to age or medical condition and Propofol  Airway Management Planned: Oral ETT  Additional Equipment:   Intra-op Plan:   Post-operative  Plan: Extubation in OR  Informed Consent: I have reviewed the patients History and Physical, chart, labs and discussed the procedure including the risks, benefits and alternatives for the proposed anesthesia with the patient or authorized representative who has indicated his/her understanding and acceptance.     Plan Discussed with: CRNA and Surgeon  Anesthesia Plan Comments:        Anesthesia Quick Evaluation

## 2016-12-16 ENCOUNTER — Encounter (HOSPITAL_COMMUNITY): Payer: Self-pay | Admitting: Surgery

## 2016-12-16 LAB — HEMOGLOBIN A1C
Hgb A1c MFr Bld: 8.5 % — ABNORMAL HIGH (ref 4.8–5.6)
MEAN PLASMA GLUCOSE: 197 mg/dL

## 2017-01-21 ENCOUNTER — Encounter: Payer: Self-pay | Admitting: Neurology

## 2017-01-21 ENCOUNTER — Ambulatory Visit (INDEPENDENT_AMBULATORY_CARE_PROVIDER_SITE_OTHER): Payer: Medicare Other | Admitting: Neurology

## 2017-01-21 VITALS — BP 124/76 | HR 58 | Ht 71.5 in | Wt 162.0 lb

## 2017-01-21 DIAGNOSIS — G20 Parkinson's disease: Secondary | ICD-10-CM | POA: Diagnosis not present

## 2017-01-21 NOTE — Progress Notes (Signed)
Ronald Benitez was seen today in the movement disorders clinic for neurologic consultation at the request of [name], MD.  The patient is accompanied in the office today by his wife, who supplements the history.  Patient was diagnosed with Parkinson's disease on 05/08/2013.  Last visit, we started the patient on carbidopa/levodopa 25/100, one tablet 3 times per day.  The patient was also enrolled in the Parkinson's disease therapy program.  He does think that the therapy helped.  He is not sure of the levodopa has been helpful.  He is not doing faithful cardiovascular exercise.  He has fallen one time.  He tripped over a rug.  No hallucinations.  No lightheadedness or near syncope.  10/16/13 update:  The patient is following up regarding Parkinson's disease.  He is accompanied by his wife who helps to supplement the history.  He is currently on carbidopa/levodopa 25/100, one tablet 3 times per day.  He has a history of falls, which are multifactorial, likely due to Parkinson's disease as well as peripheral neuropathy.  No falls since last visit but had near falls.  He is off of the desipramine. Pt not sure if the carbidopa/levodopa 25/100 working.   Having a hard time getting out of the chair.  He seems more unstable.  He has developed a cough.  He has noticed some shortness of breath.  His wife states that he moans all of the time, but the patient cannot state why he does that.  Last visit to PCP was in February.  05/20/15 update:  The patient follows up today in the movement disorder clinic.  This patient is accompanied in the office by his spouse who supplements the history.  I have not seen him since May, 2015.  I have reviewed an extensive number of records made available to me.  At last visit I increased his carbidopa/levodopa 25/100 from one tablet 3 times a day to 2 tablets in the morning, 2 in the afternoon and one in the evening.  I also referred him to rehabilitation.  I was  also concerned about shortness of breath since last visit and asked his primary care physician to see him that same day, which he did.  He subsequently saw cardiology and had a Myoview done in July, 2015 which was an intermediate risk study with significant reversible lateral apical defect noted.  Following this, a cardiac cath was recommended and was completed in August, 2015 at which point a 95% stenosis was identified and subsequently stented.  Pt reports that he is less SOB.  No falls.  He is trying to do some walking but states that he has a "bad knee" which limits exercise.  No hallucinations.  11/18/15 update:  The patient follows up today in the movement disorder clinic.  He is on carbidopa/levodopa 25/100 2 tablets in the morning, 2 in the afternoon and one in the evening.  He has done well over the last 6 months.  No falls.  No lightheadedness or near syncope.  No hallucinations.  States that he is going to the neurorehab center soon for a screen.  States that his L knee is very painful and he "can barely walk."    03/22/16 update:  The patient f/u regarding his Parkinson's disease.  He remains on carbidopa/levodopa 25/100, 2 tablets in the morning, 2 in the afternoon and one in the evening.  He denies any falls.  He does have to be more careful on stairs.  Can't  exercise because of knee pain.  No hallucinations.  No lightheadedness or near syncope.  08/20/16 update:  Patient follows up today accompanied by her wife who supplements the history.  He is on carbidopa/levodopa 25/100, 2 tablets in the morning, 2 in the afternoon and one in the evening.  Pt fell in bathroom on Monday and hurt his shoulder.  This was second fall in bathroom this year.  No lightheadedness or near syncope.  Mood has been good.  No hallucinations.  Currently involved in rehabilitation therapies and I have reviewed those notes.  They are working with his shoulder in rehab.    01/21/17 update:  Pt f/u today for PD, accompanied  by his wife, who supplements the history.  The records that were made available to me were reviewed since last visit.  Had to go to OR in July to excise gluteal mass, one area identified as an ulcer and another as possible squamous cell CA.  He has a follow up on Monday.  On carbidopa/levodopa 25/100, 2/2/1.  Denies SOB  Wearing off:  No.  How long before next dose:  n/a Falls:   Yes.  , one but was months ago - fell into door and hit back N/V:  No. Hallucinations:  No.  visual distortions: No. Lightheaded:  No.  Syncope: No. Dyskinesia:  No.  PREVIOUS MEDICATIONS: none to date  ALLERGIES:  No Known Allergies  CURRENT MEDICATIONS:   Current Outpatient Prescriptions on File Prior to Visit  Medication Sig Dispense Refill  . aspirin EC 81 MG EC tablet Take 1 tablet (81 mg total) by mouth daily.    . B Complex Vitamins (VITAMIN B COMPLEX PO) Take 1 tablet by mouth every evening.    . captopril (CAPOTEN) 12.5 MG tablet Take 12.5 mg by mouth at bedtime.     . carbidopa-levodopa (SINEMET IR) 25-100 MG tablet 2 in the morning, 2 in the afternoon, 1 in the evening (Patient taking differently: 2 in the morning, 2 in the afternoon, 1 in the evening) 450 tablet 1  . Cholecalciferol (VITAMIN D3) 2000 units capsule Take 2,000 Units by mouth daily.    . clopidogrel (PLAVIX) 75 MG tablet Take 1 tablet (75 mg total) by mouth daily with breakfast. 30 tablet 11  . Coenzyme Q10 (COQ-10) 200 MG CAPS Take 200 mg by mouth daily.    . Cyanocobalamin (VITAMIN B-12) 5000 MCG TBDP Take 5,000 mcg by mouth daily with supper.    . desipramine (NORPRAMIN) 50 MG tablet Take 50 mg by mouth at bedtime.    . finasteride (PROSCAR) 5 MG tablet Take 5 mg by mouth daily after supper.     Marland Kitchen glimepiride (AMARYL) 2 MG tablet Take 4mg  by mouth every morning  3  . Glucos-Chond-Hyal Ac-Ca Fructo (MOVE FREE JOINT HEALTH ADVANCE) TABS Take 1 tablet by mouth 2 (two) times daily.    . Homeopathic Products (ARNICARE ARNICA EX) Apply 1  application topically daily as needed (joint pain).    . LORazepam (ATIVAN) 0.5 MG tablet Take 0.5 mg by mouth 2 (two) times daily.     . metoprolol (LOPRESSOR) 25 MG tablet Take 1 tablet (25 mg total) by mouth 2 (two) times daily. 60 tablet 11  . Multiple Vitamins-Minerals (ICAPS) CAPS Take 1 capsule by mouth 2 (two) times daily.     . nitroGLYCERIN (NITROSTAT) 0.4 MG SL tablet Place 1 tablet (0.4 mg total) under the tongue every 5 (five) minutes x 3 doses as needed for chest  pain. 25 tablet 12  . Omega-3 Fatty Acids (FISH OIL) 1000 MG CAPS Take 1,000 mg by mouth daily.    . polyethylene glycol (MIRALAX / GLYCOLAX) packet Take 17 g by mouth daily.     [name] Sodium (STOOL SOFTENER & LAXATIVE PO) Take 1 tablet by mouth 2 (two) times daily.     . simvastatin (ZOCOR) 80 MG tablet Take 80 mg by mouth at bedtime.     . tamsulosin (FLOMAX) 0.4 MG CAPS capsule Take 0.4 mg by mouth daily with supper.  0  . traMADol (ULTRAM) 50 MG tablet Take 1-2 tablets (50-100 mg total) by mouth every 6 (six) hours as needed for moderate pain or severe pain. 20 tablet 0  . vitamin C (ASCORBIC ACID) 500 MG tablet Take 500 mg by mouth daily.    . vitamin E 400 UNIT capsule Take 400 Units by mouth daily.     No current facility-administered medications on file prior to visit.      PAST MEDICAL HISTORY:   Past Medical History:  Diagnosis Date  . Arthritis   . BPH (benign prostatic hyperplasia)   . Bruises easily   . Bruising    pt fell approx. 3 wks ago (3 wk in june 2018) has bruising down the back  . CAD (coronary artery disease) cardiologist-  dr berry   a. 01/16/14 s/p DES x2 to RCA. b remote RCA stenting 03/ 1997; 04/ 1997 stenting OM2;  11/ 1997  stenting pCFX, OM2 (restenosed), angioplasty OM1   . CKD (chronic kidney disease), stage III   . COPD mixed type [facility]) pulmologist-  dr byrum   mild obstructive and restrictive  . Depression   . Diverticulosis of colon   . Dyspnea   . First degree  heart block   . GERD (gastroesophageal reflux disease)   . Hearing loss of both ears   . Hiatal hernia   . History of adenomatous polyp of colon    last colonoscopy 12-06-2016 tubular adenoma w/ high grade hyperplasia (cecal polyp),  tubular adenoma not high grade dysplasia (bx),  tubulovillious adenoma's not high grade dysplasia (sigmoid polyp x2)  . History of coronary artery stent placement    03/ 1997;  04/ 1997;  11/ 1997  (unknown type)  . History of non-ST elevation myocardial infarction (NSTEMI) 03/01/2000  . HTN (hypertension)   . Hyperlipidemia   . Idiopathic Parkinson's disease [facility])    neurologist-  dr Elna Radovich  . Right bundle branch block (RBBB) with left anterior fascicular block (LAFB)   . S/P drug eluting coronary stent placement    01-15-2017  . Sebaceous cyst    chronic of buttock-- per pt open area size tennis ball  . Speech impediment   . Type 2 diabetes mellitus ([place])   . Unsteady gait   . Wears glasses     PAST SURGICAL HISTORY:   Past Surgical History:  Procedure Laterality Date  . CARDIAC CATHETERIZATION  02-10-2000  dr Daneen Schick   high-grade functional total occludion of PDA, high-grade obstruction in branch of the acute margainal branch of RCA,  total occlusion of branch of the D1 with high-grade obstruction in the second branch of D1; mLAD 50%,  third obtuse marginal branch 70-80%; all of these sites of stenosis in the branch vessels are not signifinantly changed from cardiac cath. after PCI 1997,except acute marginal br   . CARDIAC CATHETERIZATION  05-10-2005  dr Tamala Julian   severe CAD w/ total occlusion D1, high-grade  obstruction in acute marginal of RCA and PDA, high-grade ostruction in more superior branch of OM1 w/ wide patency of second larger branch of OM1;  continuation severe diffuse disease CFX , has very limited distribution, LAD main portion patent as is prox., mid, & distal RCA; ef 50% w/ mild anterolateral hypokinesis   . CARDIOVASCULAR STRESS TEST   12-12-2013  dr berry   Intermediate nuclear study w/ significant reversible lateral , apical lateral defect which may represent diagonal vessel ischemia/ normal LV wall motion,  lateral hypokinesis w/ ef 48%  . CATARACT EXTRACTION W/ INTRAOCULAR LENS IMPLANT Bilateral 2009 approx.  . COLONOSCOPY WITH PROPOFOL N/A 12/06/2016   Procedure: COLONOSCOPY WITH PROPOFOL;  Surgeon: Wilford Corner, MD;  Location: [facility];  Service: Endoscopy;  Laterality: N/A;  . CORONARY ANGIOPLASTY WITH STENT PLACEMENT  08/1995  dr Daneen Schick   stenting to RCA  . CORONARY ANGIOPLASTY WITH STENT PLACEMENT  04/ 1997  dr Daneen Schick   stenting to [place] WITH STENT PLACEMENT  11/ 1997  dr Daneen Schick   stenting to proxCFX, OM2 (restenosed), PTCA to OM1  . ESOPHAGOGASTRODUODENOSCOPY ENDOSCOPY    . LEFT HEART CATHETERIZATION WITH CORONARY ANGIOGRAM N/A 01/15/2014   Procedure: LEFT HEART CATHETERIZATION WITH CORONARY ANGIOGRAM;  Surgeon: Lorretta Harp, MD;  Location: [facility] CATH LAB;  Service: Cardiovascular;  Laterality: N/A; occluded moderate size diagonal branch which fills by collaterals faintly;  patent mid AV groove CFX stent, patent mRCA stent w/ high-grade tandem stenosis's in the mid and distal portion  . MASS EXCISION Left 12/15/2016   Procedure: REMOVAL OF LEFT GLUTEAL SUBCUTANEOUS MASS;  Surgeon: Michael Boston, MD;  Location: [facility];  Service: General;  Laterality: Left;  . OPEN INGUINAL HERNIA REPAIR W/ MESH Right 01/ 24/ 2012   dr Dalbert Batman  . PERCUTANEOUS CORONARY ROTOBLATOR INTERVENTION (PCI-R) N/A 01/16/2014   Procedure: PERCUTANEOUS CORONARY ROTOBLATOR INTERVENTION (PCI-R);  Surgeon: Troy Sine, MD;  Location: [facility] CATH LAB;  Service: Cardiovascular;  Laterality: N/A;  PTCI to RCA and DES x2 to mid and distal RCA  . TONSILLECTOMY  child  . TRANSTHORACIC ECHOCARDIOGRAM  12-06-2013   dr berry   mild focal basal hypertrophy of the septum,  ef 50-55%,  grade 3 diastolic dysfunction/  mild  AR/ mild to moderate MR/  increased thickness of the atrial septum consistant with lipomatous hypertrophy and very small PFO (agitated saline contrast study showed small right-to-left atrial level shunt/  trivial PR/  mild RAE     SOCIAL HISTORY:   Social History   Social History  . Marital status: Married    Spouse name: N/A  . Number of children: N/A  . Years of education: N/A   Occupational History  . retired Retired   Social History Main Topics  . Smoking status: Former Smoker    Packs/day: 1.50    Years: 60.00    Types: Cigarettes, Cigars    Quit date: 05/07/2013  . Smokeless tobacco: Never Used     Comment: quit cigarettes 1985 1.5 PPD, quit cigars 05/2013-- 5 cigars per day  . Alcohol use No  . Drug use: No  . Sexual activity: Not on file   Other Topics Concern  . Not on file   Social History Narrative  . No narrative on file    FAMILY HISTORY:   Family Status  Relation Status  . Mother Deceased       Heart Attack  . Father Deceased  Heart Problems  . Sister Alive       2, Lung Cancer  . Brother Deceased       Encephalitis  . Son Alive       3, Diabetes  . Daughter Alive  . Sister (Not Specified)  . Sister (Not Specified)    ROS:  A complete 10 system review of systems was obtained and was unremarkable apart from what is mentioned above.  PHYSICAL EXAMINATION:    VITALS:   Vitals:   01/21/17 1430  BP: 124/76  Pulse: (!) 58  SpO2: 93%  Weight: 162 lb (73.5 kg)  Height: 5' 11.5" (1.816 m)    GEN:  The patient appears stated age.   HEENT:  [place]/AT.  MMM. CV:  Bradycardic but regular Lungs:  Some doe.     Neurological examination:  Orientation: The patient is alert and oriented x3.  CN's:  There is good facial symmetry.  There is facial hypomimia.  Extra ocular muscles are intact.  The visual fields are full.  Speech is fluent and clear.  Soft palate rises symmetrically and there is no tongue deviation. Sensation: Sensation is intact to  light touch throughout. Motor: Strength is 5/5 in the upper and lower extremity.  Movement examination: Tone: There is good tone on the L.  Some trouble relaxing on the right (some gegenhalten) Abnormal movements: There is no tremor today Coordination:  There is decreased alternation of supination/pronation of the forearm bilaterally Gait and Station: The patient pushes off of the chair.  He is sidebent to the right.  He is antalgic in his gait.    ASSESSMENT/PLAN:  1.  idiopathic Parkinson's disease.  -He is on carbidopa/levodopa 25/100, 2/2/1.  Will remain on that.  -states that he is scheduled for therapy in October but he wants to hold on that and I told him just to let neurorehab center know that. Encouraged home exercise as tolerated 2.  Falls  -encouraged walker again 3.  shortness of breath  -This is better after cardiac stent but seems to be progresses again 4.  I will f/u with him in the next 4-5 months, sooner should new neurologic issues arise.  Much greater than 50% of this visit was spent in counseling and coordinating care.  Total face to face time:  25 min

## 2017-03-17 ENCOUNTER — Ambulatory Visit: Payer: Medicare Other

## 2017-03-17 ENCOUNTER — Ambulatory Visit: Payer: Medicare Other | Admitting: Occupational Therapy

## 2017-03-17 ENCOUNTER — Ambulatory Visit: Payer: Medicare Other | Admitting: Physical Therapy

## 2017-06-03 ENCOUNTER — Other Ambulatory Visit: Payer: Self-pay | Admitting: Neurology

## 2017-06-03 MED ORDER — CARBIDOPA-LEVODOPA 25-100 MG PO TABS: ORAL_TABLET | ORAL | 0 refills | Status: DC

## 2017-06-22 NOTE — Progress Notes (Signed)
Ronald Benitez was seen today in the movement disorders clinic for neurologic consultation at the request of [name], MD.  The patient is accompanied in the office today by his wife, who supplements the history.  Patient was diagnosed with Parkinson's disease on 05/08/2013.  Last visit, we started the patient on carbidopa/levodopa 25/100, one tablet 3 times per day.  The patient was also enrolled in the Parkinson's disease therapy program.  He does think that the therapy helped.  He is not sure of the levodopa has been helpful.  He is not doing faithful cardiovascular exercise.  He has fallen one time.  He tripped over a rug.  No hallucinations.  No lightheadedness or near syncope.  10/16/13 update:  The patient is following up regarding Parkinson's disease.  He is accompanied by his wife who helps to supplement the history.  He is currently on carbidopa/levodopa 25/100, one tablet 3 times per day.  He has a history of falls, which are multifactorial, likely due to Parkinson's disease as well as peripheral neuropathy.  No falls since last visit but had near falls.  He is off of the desipramine. Pt not sure if the carbidopa/levodopa 25/100 working.   Having a hard time getting out of the chair.  He seems more unstable.  He has developed a cough.  He has noticed some shortness of breath.  His wife states that he moans all of the time, but the patient cannot state why he does that.  Last visit to PCP was in February.  05/20/15 update:  The patient follows up today in the movement disorder clinic.  This patient is accompanied in the office by his spouse who supplements the history.  I have not seen him since May, 2015.  I have reviewed an extensive number of records made available to me.  At last visit I increased his carbidopa/levodopa 25/100 from one tablet 3 times a day to 2 tablets in the morning, 2 in the afternoon and one in the evening.  I also referred him to rehabilitation.  I was  also concerned about shortness of breath since last visit and asked his primary care physician to see him that same day, which he did.  He subsequently saw cardiology and had a Myoview done in July, 2015 which was an intermediate risk study with significant reversible lateral apical defect noted.  Following this, a cardiac cath was recommended and was completed in August, 2015 at which point a 95% stenosis was identified and subsequently stented.  Pt reports that he is less SOB.  No falls.  He is trying to do some walking but states that he has a "bad knee" which limits exercise.  No hallucinations.  11/18/15 update:  The patient follows up today in the movement disorder clinic.  He is on carbidopa/levodopa 25/100 2 tablets in the morning, 2 in the afternoon and one in the evening.  He has done well over the last 6 months.  No falls.  No lightheadedness or near syncope.  No hallucinations.  States that he is going to the neurorehab center soon for a screen.  States that his L knee is very painful and he "can barely walk."    03/22/16 update:  The patient f/u regarding his Parkinson's disease.  He remains on carbidopa/levodopa 25/100, 2 tablets in the morning, 2 in the afternoon and one in the evening.  He denies any falls.  He does have to be more careful on stairs.  Can't  exercise because of knee pain.  No hallucinations.  No lightheadedness or near syncope.  08/20/16 update:  Patient follows up today accompanied by her wife who supplements the history.  He is on carbidopa/levodopa 25/100, 2 tablets in the morning, 2 in the afternoon and one in the evening.  Pt fell in bathroom on Monday and hurt his shoulder.  This was second fall in bathroom this year.  No lightheadedness or near syncope.  Mood has been good.  No hallucinations.  Currently involved in rehabilitation therapies and I have reviewed those notes.  They are working with his shoulder in rehab.    01/21/17 update:  Pt f/u today for PD, accompanied  by his wife, who supplements the history.  The records that were made available to me were reviewed since last visit.  Had to go to OR in July to excise gluteal mass, one area identified as an ulcer and another as possible squamous cell CA.  He has a follow up on Monday.  On carbidopa/levodopa 25/100, 2/2/1.  Denies SOB  Wearing off:  No.  How long before next dose:  n/a Falls:   Yes.  , one but was months ago - fell into door and hit back N/V:  No. Hallucinations:  No.  visual distortions: No. Lightheaded:  No.  Syncope: No. Dyskinesia:  No.   06/23/17 update: The patient is seen today in follow-up for Parkinson's disease.  He is accompanied by his wife who supplements the history.  The patient is on carbidopa/levodopa 25/100, 2/2/1.  Pt denies falls.  Pt denies lightheadedness, near syncope.  No hallucinations.  Mood has been good.  PREVIOUS MEDICATIONS: none to date  ALLERGIES:  No Known Allergies  CURRENT MEDICATIONS:   Current Outpatient Medications on File Prior to Visit  Medication Sig Dispense Refill  . ampicillin (PRINCIPEN) 500 MG capsule TK 1 C PO BID  0  . aspirin EC 81 MG EC tablet Take 1 tablet (81 mg total) by mouth daily.    . B Complex Vitamins (VITAMIN B COMPLEX PO) Take 1 tablet by mouth every evening.    . captopril (CAPOTEN) 12.5 MG tablet Take 12.5 mg by mouth at bedtime.     . carbidopa-levodopa (SINEMET IR) 25-100 MG tablet 2 in the morning, 2 in the afternoon, 1 in the evening 450 tablet 0  . Cholecalciferol (VITAMIN D3) 2000 units capsule Take 2,000 Units by mouth daily.    . clopidogrel (PLAVIX) 75 MG tablet Take 1 tablet (75 mg total) by mouth daily with breakfast. 30 tablet 11  . Coenzyme Q10 (COQ-10) 200 MG CAPS Take 200 mg by mouth daily.    . Cyanocobalamin (VITAMIN B-12) 5000 MCG TBDP Take 5,000 mcg by mouth daily with supper.    . desipramine (NORPRAMIN) 50 MG tablet Take 50 mg by mouth at bedtime.    . finasteride (PROSCAR) 5 MG tablet Take 5 mg by  mouth daily after supper.     Marland Kitchen glimepiride (AMARYL) 2 MG tablet Take 4mg  by mouth every morning  3  . Glucos-Chond-Hyal Ac-Ca Fructo (MOVE FREE JOINT HEALTH ADVANCE) TABS Take 1 tablet by mouth 2 (two) times daily.    . Homeopathic Products (ARNICARE ARNICA EX) Apply 1 application topically daily as needed (joint pain).    . metoprolol (LOPRESSOR) 25 MG tablet Take 1 tablet (25 mg total) by mouth 2 (two) times daily. 60 tablet 11  . Multiple Vitamins-Minerals (ICAPS) CAPS Take 1 capsule by mouth 2 (two) times daily.     Marland Kitchen  Omega-3 Fatty Acids (FISH OIL) 1000 MG CAPS Take 1,000 mg by mouth daily.    . polyethylene glycol (MIRALAX / GLYCOLAX) packet Take 17 g by mouth daily.     [name] Sodium (STOOL SOFTENER & LAXATIVE PO) Take 1 tablet by mouth 2 (two) times daily.     . simvastatin (ZOCOR) 80 MG tablet Take 80 mg by mouth at bedtime.     . tamsulosin (FLOMAX) 0.4 MG CAPS capsule Take 0.4 mg by mouth daily with supper.  0  . traMADol (ULTRAM) 50 MG tablet Take 1-2 tablets (50-100 mg total) by mouth every 6 (six) hours as needed for moderate pain or severe pain. 20 tablet 0  . vitamin C (ASCORBIC ACID) 500 MG tablet Take 500 mg by mouth daily.    . vitamin E 400 UNIT capsule Take 400 Units by mouth daily.    . nitroGLYCERIN (NITROSTAT) 0.4 MG SL tablet Place 1 tablet (0.4 mg total) under the tongue every 5 (five) minutes x 3 doses as needed for chest pain. (Patient not taking: Reported on 06/23/2017) 25 tablet 12   No current facility-administered medications on file prior to visit.      PAST MEDICAL HISTORY:   Past Medical History:  Diagnosis Date  . Arthritis   . BPH (benign prostatic hyperplasia)   . Bruises easily   . Bruising    pt fell approx. 3 wks ago (3 wk in june 2018) has bruising down the back  . CAD (coronary artery disease) cardiologist-  dr berry   a. 01/16/14 s/p DES x2 to RCA. b remote RCA stenting 03/ 1997; 04/ 1997 stenting OM2;  11/ 1997  stenting pCFX, OM2  (restenosed), angioplasty OM1   . CKD (chronic kidney disease), stage III   . COPD mixed type [facility]) pulmologist-  dr byrum   mild obstructive and restrictive  . Depression   . Diverticulosis of colon   . Dyspnea   . First degree heart block   . GERD (gastroesophageal reflux disease)   . Hearing loss of both ears   . Hiatal hernia   . History of adenomatous polyp of colon    last colonoscopy 12-06-2016 tubular adenoma w/ high grade hyperplasia (cecal polyp),  tubular adenoma not high grade dysplasia (bx),  tubulovillious adenoma's not high grade dysplasia (sigmoid polyp x2)  . History of coronary artery stent placement    03/ 1997;  04/ 1997;  11/ 1997  (unknown type)  . History of non-ST elevation myocardial infarction (NSTEMI) 03/01/2000  . HTN (hypertension)   . Hyperlipidemia   . Idiopathic Parkinson's disease [facility])    neurologist-  dr tat  . Right bundle branch block (RBBB) with left anterior fascicular block (LAFB)   . S/P drug eluting coronary stent placement    01-15-2017  . Sebaceous cyst    chronic of buttock-- per pt open area size tennis ball  . Speech impediment   . Type 2 diabetes mellitus ([place])   . Unsteady gait   . Wears glasses     PAST SURGICAL HISTORY:   Past Surgical History:  Procedure Laterality Date  . CARDIAC CATHETERIZATION  02-10-2000  dr Daneen Schick   high-grade functional total occludion of PDA, high-grade obstruction in branch of the acute margainal branch of RCA,  total occlusion of branch of the D1 with high-grade obstruction in the second branch of D1; mLAD 50%,  third obtuse marginal branch 70-80%; all of these sites of stenosis in the branch vessels  are not signifinantly changed from cardiac cath. after PCI 1997,except acute marginal br   . CARDIAC CATHETERIZATION  05-10-2005  dr Tamala Julian   severe CAD w/ total occlusion D1, high-grade obstruction in acute marginal of RCA and PDA, high-grade ostruction in more superior branch of OM1 w/ wide patency of  second larger branch of OM1;  continuation severe diffuse disease CFX , has very limited distribution, LAD main portion patent as is prox., mid, & distal RCA; ef 50% w/ mild anterolateral hypokinesis   . CARDIOVASCULAR STRESS TEST  12-12-2013  dr berry   Intermediate nuclear study w/ significant reversible lateral , apical lateral defect which may represent diagonal vessel ischemia/ normal LV wall motion,  lateral hypokinesis w/ ef 48%  . CATARACT EXTRACTION W/ INTRAOCULAR LENS IMPLANT Bilateral 2009 approx.  . COLONOSCOPY WITH PROPOFOL N/A 12/06/2016   Procedure: COLONOSCOPY WITH PROPOFOL;  Surgeon: Wilford Corner, MD;  Location: [place];  Service: Endoscopy;  Laterality: N/A;  . CORONARY ANGIOPLASTY WITH STENT PLACEMENT  08/1995  dr Daneen Schick   stenting to RCA  . CORONARY ANGIOPLASTY WITH STENT PLACEMENT  04/ 1997  dr Daneen Schick   stenting to [place] WITH STENT PLACEMENT  11/ 1997  dr Daneen Schick   stenting to proxCFX, OM2 (restenosed), PTCA to OM1  . ESOPHAGOGASTRODUODENOSCOPY ENDOSCOPY    . LEFT HEART CATHETERIZATION WITH CORONARY ANGIOGRAM N/A 01/15/2014   Procedure: LEFT HEART CATHETERIZATION WITH CORONARY ANGIOGRAM;  Surgeon: Lorretta Harp, MD;  Location: [facility] CATH LAB;  Service: Cardiovascular;  Laterality: N/A; occluded moderate size diagonal branch which fills by collaterals faintly;  patent mid AV groove CFX stent, patent mRCA stent w/ high-grade tandem stenosis's in the mid and distal portion  . MASS EXCISION Left 12/15/2016   Procedure: REMOVAL OF LEFT GLUTEAL SUBCUTANEOUS MASS;  Surgeon: Michael Boston, MD;  Location: [facility];  Service: General;  Laterality: Left;  . OPEN INGUINAL HERNIA REPAIR W/ MESH Right 01/ 24/ 2012   dr Dalbert Batman  . PERCUTANEOUS CORONARY ROTOBLATOR INTERVENTION (PCI-R) N/A 01/16/2014   Procedure: PERCUTANEOUS CORONARY ROTOBLATOR INTERVENTION (PCI-R);  Surgeon: Troy Sine, MD;  Location: [facility] CATH LAB;  Service: Cardiovascular;   Laterality: N/A;  PTCI to RCA and DES x2 to mid and distal RCA  . TONSILLECTOMY  child  . TRANSTHORACIC ECHOCARDIOGRAM  12-06-2013   dr berry   mild focal basal hypertrophy of the septum,  ef 50-55%,  grade 3 diastolic dysfunction/  mild AR/ mild to moderate MR/  increased thickness of the atrial septum consistant with lipomatous hypertrophy and very small PFO (agitated saline contrast study showed small right-to-left atrial level shunt/  trivial PR/  mild RAE     SOCIAL HISTORY:   Social History   Socioeconomic History  . Marital status: Married    Spouse name: Not on file  . Number of children: Not on file  . Years of education: Not on file  . Highest education level: Not on file  Social Needs  . Financial resource strain: Not on file  . Food insecurity - worry: Not on file  . Food insecurity - inability: Not on file  . Transportation needs - medical: Not on file  . Transportation needs - non-medical: Not on file  Occupational History  . Occupation: retired    Fish farm manager: RETIRED  Tobacco Use  . Smoking status: Former Smoker    Packs/day: 1.50    Years: 60.00    Pack years: 90.00    Types:  Cigarettes, Cigars    Last attempt to quit: 05/07/2013    Years since quitting: 4.1  . Smokeless tobacco: Never Used  . Tobacco comment: quit cigarettes 1985 1.5 PPD, quit cigars 05/2013-- 5 cigars per day  Substance and Sexual Activity  . Alcohol use: No  . Drug use: No  . Sexual activity: Not on file  Other Topics Concern  . Not on file  Social History Narrative  . Not on file    FAMILY HISTORY:   Family Status  Relation Name Status  . Mother  Deceased       Heart Attack  . Father  Deceased       Heart Problems  . Sister  Alive       2, Lung Cancer  . Brother  Deceased       Encephalitis  . Son  Alive       3, Diabetes  . Daughter  Alive  . Sister  (Not Specified)  . Sister  (Not Specified)    ROS:  A complete 10 system review of systems was obtained and was  unremarkable apart from what is mentioned above.  PHYSICAL EXAMINATION:    VITALS:   Vitals:   06/23/17 1429  BP: 100/64  Pulse: 62  SpO2: 96%  Weight: 177 lb (80.3 kg)  Height: 5' 11.5" (1.816 m)    GEN:  The patient appears stated age.   HEENT:  [place]/AT.  MMM. CV:  Bradycardic but regular Lungs:  Some doe.     Neurological examination:  Orientation: The patient is alert and oriented x3.  CN's:  There is good facial symmetry.  There is facial hypomimia.  Extra ocular muscles are intact.  The visual fields are full.  Speech is fluent and clear.  Soft palate rises symmetrically and there is no tongue deviation. Sensation: Sensation is intact to light touch throughout. Motor: Strength is 5/5 in the upper and lower extremity.  Movement examination: Tone: There is mild to mod increased tone on the R Abnormal movements: There is mild LUE resting tremor Coordination:  There is decreased alternation of supination/pronation of the forearm bilaterally Gait and Station: The patient pushes off of the chair.  He is sidebent to the right.  He is antalgic in his gait.    He is mildly unsteady  ASSESSMENT/PLAN:  1.  idiopathic Parkinson's disease.  -He is on carbidopa/levodopa 25/100, 2/2/1.  He is a little more stiff today but he is happy and we decided not to change medication 2.  Falls  -encouraged walker again 3.  shortness of breath  -This is better after cardiac stent but more progressive again 4. Follow up is anticipated in the next few months, sooner should new neurologic issues arise.

## 2017-06-23 ENCOUNTER — Ambulatory Visit: Payer: Medicare Other | Admitting: Neurology

## 2017-06-23 ENCOUNTER — Encounter: Payer: Self-pay | Admitting: Neurology

## 2017-06-23 VITALS — BP 100/64 | HR 62 | Ht 71.5 in | Wt 177.0 lb

## 2017-06-23 DIAGNOSIS — G20 Parkinson's disease: Secondary | ICD-10-CM | POA: Diagnosis not present

## 2017-07-19 ENCOUNTER — Other Ambulatory Visit: Payer: Self-pay | Admitting: Cardiovascular Disease

## 2017-07-19 NOTE — Telephone Encounter (Signed)
°*  STAT* If patient is at the pharmacy, call can be transferred to refill team.   1. Which medications need to be refilled? (please list name of each medication and dose if known)[name]  2. Which pharmacy/location (including street and city if local pharmacy) is medication to be sent to? [facility] ph# [phone]  9. Do they need a 30 day or 90 day supply? [name]

## 2017-07-21 MED ORDER — CLOPIDOGREL BISULFATE 75 MG PO TABS: 75.0000 mg | ORAL_TABLET | Freq: Every day | ORAL | 0 refills | Status: DC

## 2017-09-02 ENCOUNTER — Other Ambulatory Visit: Payer: Self-pay | Admitting: Neurology

## 2017-09-02 MED ORDER — CARBIDOPA-LEVODOPA 25-100 MG PO TABS: ORAL_TABLET | ORAL | 1 refills | Status: DC

## 2017-09-16 ENCOUNTER — Other Ambulatory Visit: Payer: Self-pay | Admitting: Cardiovascular Disease

## 2017-09-22 ENCOUNTER — Telehealth: Payer: Self-pay | Admitting: Cardiovascular Disease

## 2017-09-22 NOTE — Telephone Encounter (Signed)
Scheduled ov with Dr. Gwenlyn Found. Pt is overdue and requesting medication renewal for Plavix. Appt scheduled for 10/11/17.

## 2017-10-10 ENCOUNTER — Inpatient Hospital Stay (HOSPITAL_COMMUNITY)
Admission: EM | Admit: 2017-10-10 | Discharge: 2017-10-19 | DRG: 064 | Disposition: A | Discharge status: Hospice | Payer: Medicare Other | Attending: Family Medicine | Admitting: Family Medicine

## 2017-10-10 ENCOUNTER — Emergency Department (HOSPITAL_COMMUNITY): Payer: Medicare Other

## 2017-10-10 ENCOUNTER — Inpatient Hospital Stay (HOSPITAL_COMMUNITY): Payer: Medicare Other

## 2017-10-10 ENCOUNTER — Encounter (HOSPITAL_COMMUNITY): Payer: Self-pay

## 2017-10-10 ENCOUNTER — Other Ambulatory Visit: Payer: Self-pay

## 2017-10-10 DIAGNOSIS — I248 Other forms of acute ischemic heart disease: Secondary | ICD-10-CM | POA: Diagnosis present

## 2017-10-10 DIAGNOSIS — R130 Aphagia: Secondary | ICD-10-CM | POA: Diagnosis present

## 2017-10-10 DIAGNOSIS — R748 Abnormal levels of other serum enzymes: Secondary | ICD-10-CM | POA: Diagnosis not present

## 2017-10-10 DIAGNOSIS — Z7984 Long term (current) use of oral hypoglycemic drugs: Secondary | ICD-10-CM

## 2017-10-10 DIAGNOSIS — Q211 Atrial septal defect: Secondary | ICD-10-CM

## 2017-10-10 DIAGNOSIS — I129 Hypertensive chronic kidney disease with stage 1 through stage 4 chronic kidney disease, or unspecified chronic kidney disease: Secondary | ICD-10-CM | POA: Diagnosis present

## 2017-10-10 DIAGNOSIS — I6330 Cerebral infarction due to thrombosis of unspecified cerebral artery: Secondary | ICD-10-CM

## 2017-10-10 DIAGNOSIS — R4702 Dysphasia: Secondary | ICD-10-CM | POA: Diagnosis present

## 2017-10-10 DIAGNOSIS — I63032 Cerebral infarction due to thrombosis of left carotid artery: Secondary | ICD-10-CM | POA: Diagnosis not present

## 2017-10-10 DIAGNOSIS — Z9189 Other specified personal risk factors, not elsewhere classified: Secondary | ICD-10-CM

## 2017-10-10 DIAGNOSIS — I440 Atrioventricular block, first degree: Secondary | ICD-10-CM | POA: Diagnosis present

## 2017-10-10 DIAGNOSIS — R451 Restlessness and agitation: Secondary | ICD-10-CM | POA: Diagnosis not present

## 2017-10-10 DIAGNOSIS — W19XXXA Unspecified fall, initial encounter: Secondary | ICD-10-CM | POA: Diagnosis present

## 2017-10-10 DIAGNOSIS — I700 Atherosclerosis of aorta: Secondary | ICD-10-CM | POA: Diagnosis present

## 2017-10-10 DIAGNOSIS — R1312 Dysphagia, oropharyngeal phase: Secondary | ICD-10-CM | POA: Diagnosis not present

## 2017-10-10 DIAGNOSIS — R7989 Other specified abnormal findings of blood chemistry: Secondary | ICD-10-CM

## 2017-10-10 DIAGNOSIS — N183 Chronic kidney disease, stage 3 unspecified: Secondary | ICD-10-CM | POA: Diagnosis present

## 2017-10-10 DIAGNOSIS — G8191 Hemiplegia, unspecified affecting right dominant side: Secondary | ICD-10-CM | POA: Diagnosis present

## 2017-10-10 DIAGNOSIS — S0003XA Contusion of scalp, initial encounter: Secondary | ICD-10-CM | POA: Diagnosis present

## 2017-10-10 DIAGNOSIS — I252 Old myocardial infarction: Secondary | ICD-10-CM | POA: Diagnosis not present

## 2017-10-10 DIAGNOSIS — N400 Enlarged prostate without lower urinary tract symptoms: Secondary | ICD-10-CM | POA: Diagnosis present

## 2017-10-10 DIAGNOSIS — M170 Bilateral primary osteoarthritis of knee: Secondary | ICD-10-CM | POA: Diagnosis present

## 2017-10-10 DIAGNOSIS — N184 Chronic kidney disease, stage 4 (severe): Secondary | ICD-10-CM | POA: Diagnosis not present

## 2017-10-10 DIAGNOSIS — I2584 Coronary atherosclerosis due to calcified coronary lesion: Secondary | ICD-10-CM | POA: Diagnosis present

## 2017-10-10 DIAGNOSIS — R509 Fever, unspecified: Secondary | ICD-10-CM | POA: Diagnosis not present

## 2017-10-10 DIAGNOSIS — Y92002 Bathroom of unspecified non-institutional (private) residence single-family (private) house as the place of occurrence of the external cause: Secondary | ICD-10-CM

## 2017-10-10 DIAGNOSIS — R414 Neurologic neglect syndrome: Secondary | ICD-10-CM | POA: Diagnosis present

## 2017-10-10 DIAGNOSIS — J181 Lobar pneumonia, unspecified organism: Secondary | ICD-10-CM | POA: Diagnosis not present

## 2017-10-10 DIAGNOSIS — I69391 Dysphagia following cerebral infarction: Secondary | ICD-10-CM | POA: Diagnosis not present

## 2017-10-10 DIAGNOSIS — Z87891 Personal history of nicotine dependence: Secondary | ICD-10-CM

## 2017-10-10 DIAGNOSIS — R1310 Dysphagia, unspecified: Secondary | ICD-10-CM | POA: Diagnosis not present

## 2017-10-10 DIAGNOSIS — E1122 Type 2 diabetes mellitus with diabetic chronic kidney disease: Secondary | ICD-10-CM | POA: Diagnosis not present

## 2017-10-10 DIAGNOSIS — E119 Type 2 diabetes mellitus without complications: Secondary | ICD-10-CM

## 2017-10-10 DIAGNOSIS — G20A1 Parkinson's disease without dyskinesia, without mention of fluctuations: Secondary | ICD-10-CM

## 2017-10-10 DIAGNOSIS — G20 Parkinson's disease: Secondary | ICD-10-CM

## 2017-10-10 DIAGNOSIS — J440 Chronic obstructive pulmonary disease with acute lower respiratory infection: Secondary | ICD-10-CM | POA: Diagnosis present

## 2017-10-10 DIAGNOSIS — E785 Hyperlipidemia, unspecified: Secondary | ICD-10-CM | POA: Diagnosis present

## 2017-10-10 DIAGNOSIS — E7800 Pure hypercholesterolemia, unspecified: Secondary | ICD-10-CM | POA: Diagnosis not present

## 2017-10-10 DIAGNOSIS — I452 Bifascicular block: Secondary | ICD-10-CM | POA: Diagnosis present

## 2017-10-10 DIAGNOSIS — I10 Essential (primary) hypertension: Secondary | ICD-10-CM | POA: Diagnosis present

## 2017-10-10 DIAGNOSIS — Z66 Do not resuscitate: Secondary | ICD-10-CM | POA: Diagnosis not present

## 2017-10-10 DIAGNOSIS — R29722 NIHSS score 22: Secondary | ICD-10-CM | POA: Diagnosis present

## 2017-10-10 DIAGNOSIS — R482 Apraxia: Secondary | ICD-10-CM | POA: Diagnosis present

## 2017-10-10 DIAGNOSIS — R4701 Aphasia: Secondary | ICD-10-CM | POA: Diagnosis present

## 2017-10-10 DIAGNOSIS — I2583 Coronary atherosclerosis due to lipid rich plaque: Secondary | ICD-10-CM | POA: Diagnosis not present

## 2017-10-10 DIAGNOSIS — Z7189 Other specified counseling: Secondary | ICD-10-CM

## 2017-10-10 DIAGNOSIS — R471 Dysarthria and anarthria: Secondary | ICD-10-CM | POA: Diagnosis present

## 2017-10-10 DIAGNOSIS — Z515 Encounter for palliative care: Secondary | ICD-10-CM

## 2017-10-10 DIAGNOSIS — I2510 Atherosclerotic heart disease of native coronary artery without angina pectoris: Secondary | ICD-10-CM | POA: Diagnosis present

## 2017-10-10 DIAGNOSIS — Z7982 Long term (current) use of aspirin: Secondary | ICD-10-CM

## 2017-10-10 DIAGNOSIS — K117 Disturbances of salivary secretion: Secondary | ICD-10-CM

## 2017-10-10 DIAGNOSIS — I6522 Occlusion and stenosis of left carotid artery: Secondary | ICD-10-CM | POA: Diagnosis present

## 2017-10-10 DIAGNOSIS — Z79899 Other long term (current) drug therapy: Secondary | ICD-10-CM

## 2017-10-10 DIAGNOSIS — I6501 Occlusion and stenosis of right vertebral artery: Secondary | ICD-10-CM | POA: Diagnosis present

## 2017-10-10 DIAGNOSIS — Z7902 Long term (current) use of antithrombotics/antiplatelets: Secondary | ICD-10-CM

## 2017-10-10 DIAGNOSIS — I63412 Cerebral infarction due to embolism of left middle cerebral artery: Secondary | ICD-10-CM | POA: Diagnosis not present

## 2017-10-10 DIAGNOSIS — I639 Cerebral infarction, unspecified: Secondary | ICD-10-CM | POA: Diagnosis not present

## 2017-10-10 DIAGNOSIS — I6340 Cerebral infarction due to embolism of unspecified cerebral artery: Principal | ICD-10-CM | POA: Diagnosis present

## 2017-10-10 DIAGNOSIS — H9193 Unspecified hearing loss, bilateral: Secondary | ICD-10-CM | POA: Diagnosis present

## 2017-10-10 DIAGNOSIS — I25110 Atherosclerotic heart disease of native coronary artery with unstable angina pectoris: Secondary | ICD-10-CM | POA: Diagnosis not present

## 2017-10-10 DIAGNOSIS — R778 Other specified abnormalities of plasma proteins: Secondary | ICD-10-CM

## 2017-10-10 DIAGNOSIS — Z955 Presence of coronary angioplasty implant and graft: Secondary | ICD-10-CM

## 2017-10-10 DIAGNOSIS — I63411 Cerebral infarction due to embolism of right middle cerebral artery: Secondary | ICD-10-CM | POA: Diagnosis not present

## 2017-10-10 DIAGNOSIS — I69320 Aphasia following cerebral infarction: Secondary | ICD-10-CM | POA: Diagnosis not present

## 2017-10-10 LAB — CBC
HCT: 35.7 % — ABNORMAL LOW (ref 39.0–52.0)
Hemoglobin: 11.5 g/dL — ABNORMAL LOW (ref 13.0–17.0)
MCH: 30.7 pg (ref 26.0–34.0)
MCHC: 32.2 g/dL (ref 30.0–36.0)
MCV: 95.2 fL (ref 78.0–100.0)
PLATELETS: 215 10*3/uL (ref 150–400)
RBC: 3.75 MIL/uL — AB (ref 4.22–5.81)
RDW: 16.5 % — ABNORMAL HIGH (ref 11.5–15.5)
WBC: 9.2 10*3/uL (ref 4.0–10.5)

## 2017-10-10 LAB — COMPREHENSIVE METABOLIC PANEL
ALBUMIN: 3.5 g/dL (ref 3.5–5.0)
ALT: 7 U/L — ABNORMAL LOW (ref 17–63)
ANION GAP: 12 (ref 5–15)
AST: 27 U/L (ref 15–41)
Alkaline Phosphatase: 73 U/L (ref 38–126)
BILIRUBIN TOTAL: 1.3 mg/dL — AB (ref 0.3–1.2)
BUN: 46 mg/dL — AB (ref 6–20)
CO2: 21 mmol/L — AB (ref 22–32)
Calcium: 9.4 mg/dL (ref 8.9–10.3)
Chloride: 106 mmol/L (ref 101–111)
Creatinine, Ser: 2.62 mg/dL — ABNORMAL HIGH (ref 0.61–1.24)
GFR calc Af Amer: 24 mL/min — ABNORMAL LOW (ref >60)
GFR calc non Af Amer: 20 mL/min — ABNORMAL LOW (ref >60)
GLUCOSE: 156 mg/dL — AB (ref 65–99)
POTASSIUM: 4 mmol/L (ref 3.5–5.1)
SODIUM: 139 mmol/L (ref 135–145)
TOTAL PROTEIN: 7 g/dL (ref 6.5–8.1)

## 2017-10-10 LAB — DIFFERENTIAL
BASOS ABS: 0.1 10*3/uL (ref 0.0–0.1)
Basophils Relative: 1 %
EOS ABS: 0.2 10*3/uL (ref 0.0–0.7)
Eosinophils Relative: 2 %
Lymphocytes Relative: 13 %
Lymphs Abs: 1.2 10*3/uL (ref 0.7–4.0)
Monocytes Absolute: 0.9 10*3/uL (ref 0.1–1.0)
Monocytes Relative: 10 %
NEUTROS PCT: 74 %
Neutro Abs: 6.9 10*3/uL (ref 1.7–7.7)

## 2017-10-10 LAB — URINALYSIS, ROUTINE W REFLEX MICROSCOPIC
BILIRUBIN URINE: NEGATIVE
Glucose, UA: NEGATIVE mg/dL
Hgb urine dipstick: NEGATIVE
Ketones, ur: 5 mg/dL — AB
Leukocytes, UA: NEGATIVE
NITRITE: NEGATIVE
PH: 5 (ref 5.0–8.0)
Protein, ur: 100 mg/dL — AB
SPECIFIC GRAVITY, URINE: 1.023 (ref 1.005–1.030)

## 2017-10-10 LAB — HEMOGLOBIN A1C
Hgb A1c MFr Bld: 8.1 % — ABNORMAL HIGH (ref 4.8–5.6)
Mean Plasma Glucose: 185.77 mg/dL

## 2017-10-10 LAB — CBG MONITORING, ED: GLUCOSE-CAPILLARY: 130 mg/dL — AB (ref 65–99)

## 2017-10-10 LAB — I-STAT TROPONIN, ED: Troponin i, poc: 0.12 ng/mL (ref 0.00–0.08)

## 2017-10-10 LAB — APTT: APTT: 35 s (ref 24–36)

## 2017-10-10 LAB — TSH: TSH: 3.338 u[IU]/mL (ref 0.350–4.500)

## 2017-10-10 LAB — RAPID URINE DRUG SCREEN, HOSP PERFORMED
AMPHETAMINES: NOT DETECTED
BENZODIAZEPINES: NOT DETECTED
Barbiturates: NOT DETECTED
Cocaine: NOT DETECTED
OPIATES: NOT DETECTED
TETRAHYDROCANNABINOL: NOT DETECTED

## 2017-10-10 LAB — PROTIME-INR
INR: 1.1
PROTHROMBIN TIME: 14.1 s (ref 11.4–15.2)

## 2017-10-10 LAB — ETHANOL

## 2017-10-10 LAB — I-STAT CHEM 8, ED
BUN: 40 mg/dL — AB (ref 6–20)
CHLORIDE: 108 mmol/L (ref 101–111)
Calcium, Ion: 1.17 mmol/L (ref 1.15–1.40)
Creatinine, Ser: 2.5 mg/dL — ABNORMAL HIGH (ref 0.61–1.24)
Glucose, Bld: 154 mg/dL — ABNORMAL HIGH (ref 65–99)
HEMATOCRIT: 36 % — AB (ref 39.0–52.0)
Hemoglobin: 12.2 g/dL — ABNORMAL LOW (ref 13.0–17.0)
POTASSIUM: 4 mmol/L (ref 3.5–5.1)
SODIUM: 142 mmol/L (ref 135–145)
TCO2: 21 mmol/L — ABNORMAL LOW (ref 22–32)

## 2017-10-10 LAB — TROPONIN I
Troponin I: 0.18 ng/mL (ref <0.03)
Troponin I: 0.59 ng/mL (ref <0.03)

## 2017-10-10 LAB — GLUCOSE, CAPILLARY
GLUCOSE-CAPILLARY: 132 mg/dL — AB (ref 65–99)
GLUCOSE-CAPILLARY: 119 mg/dL — AB (ref 65–99)

## 2017-10-10 MED ORDER — ATORVASTATIN CALCIUM 80 MG PO TABS
80.0000 mg | ORAL_TABLET | Freq: Every day | ORAL | Status: DC
  Filled 2017-10-10 (×2): qty 1

## 2017-10-10 MED ORDER — FENTANYL CITRATE (PF) 100 MCG/2ML IJ SOLN
25.0000 ug | Freq: Once | INTRAMUSCULAR | Status: AC
  Administered 2017-10-10: 25 ug via INTRAVENOUS
  Filled 2017-10-10: qty 2

## 2017-10-10 MED ORDER — ACETAMINOPHEN 650 MG RE SUPP
650.0000 mg | RECTAL | Status: DC | PRN
  Administered 2017-10-11 – 2017-10-18 (×4): 650 mg via RECTAL
  Filled 2017-10-10 (×5): qty 1

## 2017-10-10 MED ORDER — IOPAMIDOL (ISOVUE-370) INJECTION 76%
100.0000 mL | Freq: Once | INTRAVENOUS | Status: AC | PRN
  Administered 2017-10-10: 100 mL via INTRAVENOUS

## 2017-10-10 MED ORDER — ACETAMINOPHEN 160 MG/5ML PO SOLN: 650.0000 mg | ORAL | Status: DC | PRN

## 2017-10-10 MED ORDER — INSULIN ASPART 100 UNIT/ML ~~LOC~~ SOLN
0.0000 [IU] | Freq: Three times a day (TID) | SUBCUTANEOUS | Status: DC
  Administered 2017-10-10 – 2017-10-12 (×4): 1 [IU] via SUBCUTANEOUS

## 2017-10-10 MED ORDER — SODIUM CHLORIDE 0.9 % IV SOLN
INTRAVENOUS | Status: DC
  Administered 2017-10-10: 50 mL/h via INTRAVENOUS
  Administered 2017-10-11: 18:00:00 via INTRAVENOUS

## 2017-10-10 MED ORDER — ASPIRIN 300 MG RE SUPP
300.0000 mg | Freq: Every day | RECTAL | Status: DC
  Administered 2017-10-11 – 2017-10-19 (×7): 300 mg via RECTAL
  Filled 2017-10-10 (×7): qty 1

## 2017-10-10 MED ORDER — ACETAMINOPHEN 325 MG PO TABS
650.0000 mg | ORAL_TABLET | ORAL | Status: DC | PRN
Start: 2017-10-10 — End: 2017-10-19

## 2017-10-10 MED ORDER — ENOXAPARIN SODIUM 30 MG/0.3ML ~~LOC~~ SOLN
30.0000 mg | SUBCUTANEOUS | Status: DC
  Administered 2017-10-10 – 2017-10-16 (×7): 30 mg via SUBCUTANEOUS
  Filled 2017-10-10 (×7): qty 0.3

## 2017-10-10 MED ORDER — ASPIRIN 325 MG PO TABS
325.0000 mg | ORAL_TABLET | Freq: Every day | ORAL | Status: DC
Start: 2017-10-11 — End: 2017-10-19
  Administered 2017-10-18: 325 mg via ORAL
  Filled 2017-10-10: qty 1

## 2017-10-10 MED ORDER — STROKE: EARLY STAGES OF RECOVERY BOOK
Freq: Once | Status: AC
  Administered 2017-10-11: 1
  Filled 2017-10-10: qty 1

## 2017-10-10 MED ORDER — INSULIN ASPART 100 UNIT/ML ~~LOC~~ SOLN: 0.0000 [IU] | Freq: Every day | SUBCUTANEOUS | Status: DC

## 2017-10-10 MED ORDER — FENTANYL CITRATE (PF) 100 MCG/2ML IJ SOLN
25.0000 ug | INTRAMUSCULAR | Status: DC | PRN
  Administered 2017-10-11 – 2017-10-12 (×2): 25 ug via INTRAVENOUS
  Filled 2017-10-10 (×2): qty 2

## 2017-10-10 NOTE — Consult Note (Signed)
Cardiology Consultation:   Patient ID: Benitez Benitez; [id number]; April 17, 1929   Admit date: 10/10/2017 Date of Consult: 10/10/2017  Primary Care Provider: [name], MD Primary Cardiologist: Quay Burow, MD   Patient Profile:   Benitez Benitez is a 84 y.o. male with a hx of CAD s/p DES X2 to RCA 2015 and multiple remote stents, RBBB & LAFB, CKD stage III, HTN, HLD, DM type 2, COPD, GERD, HH, Parkinson's disease  who is being seen today for the evaluation of abnormal EKG at the request of Dr. Lorin Benitez.  History of Present Illness:   Mr. Benitez Benitez presented to [facility] this morning after being found unconscious in his bathroom by his wife. He is felt to have had a potentially devastating CVA. He is aphasic with right sided neglect. He has a considerable cardiac history with previous MI and multiple stents.   In 2015 he was evaluated for increased dyspnea on exertion with an intermediate risk myoview. He underwent cardiac cath with complex PCI to calcified RCA utilizing high-speed rotational atherectomy, temporary pacemaker insertion, cutting balloon, non-compliant balloon dilatation and ultimately received DES X2 in the RCA. 2D ECHO: 12/04/2013. LV EF: 50% - 55%; G3DD, mild AR, mod MR, mild RA dilation, increased thickness of atrial septum c/w lipomatous hypertrophy. Small PFO. Present only with provocation from having the patient cough. Agitated saline contrast study showed a very small right-to-left atrial level shunt, in the baseline state.  He was last seen in our office on 03/09/2016 by Dr. Gwenlyn Found at which time the patient was stable with no angina.   On assessment the patient is non-verbal, neglecting his right side. Heart sounds are normal and he has no carotid bruits. He has mild pedal edema. His wife states that she found him this morning on the floor in the bathroom and was eventually able to sit him up but he was not responding so she called 911. She had checked  on him around 1 am and he was his normal at that time. She says that she takes full care of him with dressing and bathing. He does walk with a walker and has had no complaints lately except for trouble catching his breath. She knows of no prior afib and he was not complaining of palpitations and had no previous syncope. He was not on anticoagulation other than DAPT.   Past Medical History:  Diagnosis Date  . Arthritis   . BPH (benign prostatic hyperplasia)   . Bruises easily   . CAD (coronary artery disease) cardiologist-  dr berry   a. 01/16/14 s/p DES x2 to RCA. b remote RCA stenting 03/ 1997; 04/ 1997 stenting OM2;  11/ 1997  stenting pCFX, OM2 (restenosed), angioplasty OM1   . CKD (chronic kidney disease), stage III ([place])   . COPD mixed type [facility]) pulmologist-  dr byrum   mild obstructive and restrictive  . Depression   . Diverticulosis of colon   . Dyspnea   . First degree heart block   . GERD (gastroesophageal reflux disease)   . Hearing loss of both ears   . Hiatal hernia   . History of adenomatous polyp of colon    last colonoscopy 12-06-2016 tubular adenoma w/ high grade hyperplasia (cecal polyp),  tubular adenoma not high grade dysplasia (bx),  tubulovillious adenoma's not high grade dysplasia (sigmoid polyp x2)  . History of coronary artery stent placement    03/ 1997;  04/ 1997;  11/ 1997  (unknown type); DES 01/15/17  .  History of non-ST elevation myocardial infarction (NSTEMI) 03/01/2000  . HTN (hypertension)   . Hyperlipidemia   . Idiopathic Parkinson's disease [facility])    neurologist-  dr tat  . Right bundle branch block (RBBB) with left anterior fascicular block (LAFB)   . Sebaceous cyst    chronic of buttock-- per pt open area size tennis ball  . Speech impediment   . Type 2 diabetes mellitus ([place])   . Unsteady gait   . Wears glasses     Past Surgical History:  Procedure Laterality Date  . CARDIAC CATHETERIZATION  02-10-2000  dr Daneen Schick   high-grade functional  total occludion of PDA, high-grade obstruction in branch of the acute margainal branch of RCA,  total occlusion of branch of the D1 with high-grade obstruction in the second branch of D1; mLAD 50%,  third obtuse marginal branch 70-80%; all of these sites of stenosis in the branch vessels are not signifinantly changed from cardiac cath. after PCI 1997,except acute marginal br   . CARDIAC CATHETERIZATION  05-10-2005  dr Tamala Julian   severe CAD w/ total occlusion D1, high-grade obstruction in acute marginal of RCA and PDA, high-grade ostruction in more superior branch of OM1 w/ wide patency of second larger branch of OM1;  continuation severe diffuse disease CFX , has very limited distribution, LAD main portion patent as is prox., mid, & distal RCA; ef 50% w/ mild anterolateral hypokinesis   . CARDIOVASCULAR STRESS TEST  12-12-2013  dr berry   Intermediate nuclear study w/ significant reversible lateral , apical lateral defect which may represent diagonal vessel ischemia/ normal LV wall motion,  lateral hypokinesis w/ ef 48%  . CATARACT EXTRACTION W/ INTRAOCULAR LENS IMPLANT Bilateral 2009 approx.  . COLONOSCOPY WITH PROPOFOL N/A 12/06/2016   Procedure: COLONOSCOPY WITH PROPOFOL;  Surgeon: Wilford Corner, MD;  Location: [facility];  Service: Endoscopy;  Laterality: N/A;  . CORONARY ANGIOPLASTY WITH STENT PLACEMENT  08/1995  dr Daneen Schick   stenting to RCA  . CORONARY ANGIOPLASTY WITH STENT PLACEMENT  04/ 1997  dr Daneen Schick   stenting to [place] WITH STENT PLACEMENT  11/ 1997  dr Daneen Schick   stenting to proxCFX, OM2 (restenosed), PTCA to OM1  . ESOPHAGOGASTRODUODENOSCOPY ENDOSCOPY    . LEFT HEART CATHETERIZATION WITH CORONARY ANGIOGRAM N/A 01/15/2014   Procedure: LEFT HEART CATHETERIZATION WITH CORONARY ANGIOGRAM;  Surgeon: Lorretta Harp, MD;  Location: [facility] CATH LAB;  Service: Cardiovascular;  Laterality: N/A; occluded moderate size diagonal branch which fills by collaterals  faintly;  patent mid AV groove CFX stent, patent mRCA stent w/ high-grade tandem stenosis's in the mid and distal portion  . MASS EXCISION Left 12/15/2016   Procedure: REMOVAL OF LEFT GLUTEAL SUBCUTANEOUS MASS;  Surgeon: Michael Boston, MD;  Location: [facility];  Service: General;  Laterality: Left;  . OPEN INGUINAL HERNIA REPAIR W/ MESH Right 01/ 24/ 2012   dr Dalbert Batman  . PERCUTANEOUS CORONARY ROTOBLATOR INTERVENTION (PCI-R) N/A 01/16/2014   Procedure: PERCUTANEOUS CORONARY ROTOBLATOR INTERVENTION (PCI-R);  Surgeon: Troy Sine, MD;  Location: [facility] CATH LAB;  Service: Cardiovascular;  Laterality: N/A;  PTCI to RCA and DES x2 to mid and distal RCA  . TONSILLECTOMY  child  . TRANSTHORACIC ECHOCARDIOGRAM  12-06-2013   dr berry   mild focal basal hypertrophy of the septum,  ef 50-55%,  grade 3 diastolic dysfunction/  mild AR/ mild to moderate MR/  increased thickness of the atrial septum consistant with lipomatous hypertrophy and  very small PFO (agitated saline contrast study showed small right-to-left atrial level shunt/  trivial PR/  mild RAE      Home Medications:  Prior to Admission medications   Medication Sig Start Date End Date Taking? Authorizing Provider  aspirin EC 81 MG EC tablet Take 1 tablet (81 mg total) by mouth daily. 01/17/14  Yes Eileen Stanford, PA-C  B Complex Vitamins (VITAMIN B COMPLEX PO) Take 1 tablet by mouth every evening.   Yes [provider]  captopril (CAPOTEN) 12.5 MG tablet Take 12.5 mg by mouth at bedtime.  11/05/13  Yes [provider]  carbidopa-levodopa (SINEMET IR) 25-100 MG tablet 2 in the morning, 2 in the afternoon, 1 in the evening 09/02/17  Yes Tat, Eustace Quail, DO  Cholecalciferol (VITAMIN D3) 2000 units capsule Take 2,000 Units by mouth daily.   Yes [provider]  clopidogrel (PLAVIX) 75 MG tablet Take 1 tablet (75 mg total) by mouth daily with breakfast. Contact our office for an appointment 09/16/17  Yes Lorretta Harp, MD  Coenzyme  Q10 (COQ-10) 200 MG CAPS Take 200 mg by mouth daily.   Yes [provider]  Cyanocobalamin (VITAMIN B-12) 5000 MCG TBDP Take 5,000 mcg by mouth daily with supper.   Yes [provider]  finasteride (PROSCAR) 5 MG tablet Take 5 mg by mouth daily after supper.  05/25/13  Yes [provider]  glimepiride (AMARYL) 2 MG tablet Take 2 mg by mouth daily with breakfast.   Yes [provider]  Glucos-Chond-Hyal Ac-Ca Fructo ([place]) TABS Take 1 tablet by mouth 2 (two) times daily.   Yes [provider]  Homeopathic Products (ARNICARE ARNICA EX) Apply 1 application topically daily as needed (joint pain).   Yes [provider]  metoprolol (LOPRESSOR) 25 MG tablet Take 1 tablet (25 mg total) by mouth 2 (two) times daily. 01/17/14  Yes Eileen Stanford, PA-C  Multiple Vitamins-Minerals (ICAPS) CAPS Take 1 capsule by mouth 2 (two) times daily.    Yes [provider]  Omega-3 Fatty Acids (FISH OIL) 1000 MG CAPS Take 1,000 mg by mouth daily.   Yes [provider]  polyethylene glycol (MIRALAX / GLYCOLAX) packet Take 17 g by mouth daily.    Yes [provider]  Sennosides-Docusate Sodium (STOOL SOFTENER & LAXATIVE PO) Take 1 tablet by mouth 2 (two) times daily.    Yes [provider]  simvastatin (ZOCOR) 80 MG tablet Take 80 mg by mouth at bedtime.    Yes [provider]  tamsulosin (FLOMAX) 0.4 MG CAPS capsule Take 0.4 mg by mouth daily with supper. 11/16/16  Yes [provider]  vitamin C (ASCORBIC ACID) 500 MG tablet Take 500 mg by mouth daily.   Yes [provider]  vitamin E 400 UNIT capsule Take 400 Units by mouth daily.   Yes [provider]  nitroGLYCERIN (NITROSTAT) 0.4 MG SL tablet Place 1 tablet (0.4 mg total) under the tongue every 5 (five) minutes x 3 doses as needed for chest pain. Patient not taking: Reported on 06/23/2017 01/17/14   Eileen Stanford,  PA-C  traMADol (ULTRAM) 50 MG tablet Take 1-2 tablets (50-100 mg total) by mouth every 6 (six) hours as needed for moderate pain or severe pain. 12/15/16   Michael Boston, MD    Inpatient Medications: Scheduled Meds:  Continuous Infusions:  PRN Meds:   Allergies:   No Known Allergies  Social History:   Social  History   Socioeconomic History  . Marital status: Married    Spouse name: Not on file  . Number of children: Not on file  . Years of education: Not on file  . Highest education level: Not on file  Occupational History  . Occupation: retired    Fish farm manager: RETIRED  Social Needs  . Financial resource strain: Not on file  . Food insecurity:    Worry: Not on file    Inability: Not on file  . Transportation needs:    Medical: Not on file    Non-medical: Not on file  Tobacco Use  . Smoking status: Former Smoker    Packs/day: 1.50    Years: 60.00    Pack years: 90.00    Types: Cigarettes, Cigars    Last attempt to quit: 05/07/2013    Years since quitting: 4.4  . Smokeless tobacco: Never Used  . Tobacco comment: quit cigarettes 1985 1.5 PPD, quit cigars 05/2013-- 5 cigars per day  Substance and Sexual Activity  . Alcohol use: No  . Drug use: No  . Sexual activity: Not on file  Lifestyle  . Physical activity:    Days per week: Not on file    Minutes per session: Not on file  . Stress: Not on file  Relationships  . Social connections:    Talks on phone: Not on file    Gets together: Not on file    Attends religious service: Not on file    Active member of club or organization: Not on file    Attends meetings of clubs or organizations: Not on file    Relationship status: Not on file  . Intimate partner violence:    Fear of current or ex partner: Not on file    Emotionally abused: Not on file    Physically abused: Not on file    Forced sexual activity: Not on file  Other Topics Concern  . Not on file  Social History Narrative  . Not on file    Family  History:    Family History  Problem Relation Age of Onset  . Heart attack Mother   . Heart disease Father   . Lung cancer Sister   . COPD Sister      ROS:  Please see the history of present illness.  Due to pt condition most information obtained from the wife and chart.   Physical Exam/Data:   Vitals:   10/10/17 1115 10/10/17 1130 10/10/17 1145 10/10/17 1200  BP: (!) 164/78 (!) 174/79 (!) 165/74 (!) 143/72  Pulse: 92 (!) 101 94 95  Resp: (!) 24 (!) 27 (!) 23 (!) 26  SpO2: 92% 90% 91% (!) 89%  Weight:      Height:       No intake or output data in the 24 hours ending 10/10/17 1227 Filed Weights   10/10/17 0906  Weight: 169 lb 5 oz (76.8 kg)   Body mass index is 23.61 kg/m.  General:  Frail, elderly male, acutely ill HEENT: normal Lymph: no adenopathy Neck: no JVD Endocrine:  No thryomegaly Vascular: No carotid bruits; FA pulses 2+ Cardiac:  normal S1, S2; RRR; no murmur  Lungs:  clear to auscultation bilaterally, no wheezing, rhonchi or rales  Abd: soft, nontender, no hepatomegaly  Ext: trace pedal edema Musculoskeletal:  No movement on right side Skin: warm and dry  Neuro:  Non-verbal. Neglecting right visual field Psych:  Withdrawn  EKG:  The EKG was personally reviewed and demonstrates:  1st degree AVB with known RBBB and LAFB. T waves inverted in V1-3 Telemetry:  Telemetry was personally reviewed and demonstrates:  Sinus rhythm in the 90's  Relevant CV Studies:  LHC  01/16/14  PTCI to RCA: High-speed rotational atherectomy with a 1.5 mm burr, Flextome cutting balloon, insertion of 2.5x14 mm Resolute DES stent and a 2.25x12 mm Resolute stent in the mid and distal RCA.   IMPRESSION:  Successful complex percutaneous coronary intervention to calcified right coronary artery utilizing high-speed rotational atherectomy, temporary pacemaker insertion, cutting balloon, and noncompliant balloon dilatation, with ultimate stenting of 95% stenoses to 0% with ultimate  insertion of a 2.5x14 mm Resolute DES stent post dilated to 2.7 mm and a 2.25x12 mm Resolute DES stent, postdilated to 2.3 mm  Echo 12/04/2013 Study Conclusions - Left ventricle: E/e&'>15 suggestive of elevated LV filling pressures. The cavity size was normal. There was mild focal basal hypertrophy of the septum. Systolic function was normal. The estimated ejection fraction was in the range of 50% to 55%. Wall motion was normal; there were no regional wall motion abnormalities. There was a reduced contribution of atrial contraction to ventricular filling, due to increased ventricular diastolic pressure or atrial contractile dysfunction. Doppler parameters are consistent with a reversible restrictive pattern, indicative of decreased left ventricular diastolic compliance and/or increased left atrial pressure (grade 3 diastolic dysfunction). - Aortic valve: Moderate calcification. There was mild regurgitation. - Mitral valve: There was mild to moderate regurgitation. - Right atrium: The atrium was mildly dilated. - Atrial septum: There was increased thickness of the septum, consistent with lipomatous hypertrophy. There was a very small patent foramen ovale. Present only with provocation from having the patient cough. Agitated saline contrast study showed a very small right-to-left atrial level shunt, in the baseline state. This was visible after 7 cardiac cycles. - Pulmonic valve: There was trivial regurgitation, with multiple jets directed eccentrically.  Laboratory Data:  Chemistry Recent Labs  Lab 10/10/17 0821 10/10/17 0834  NA 139 142  K 4.0 4.0  CL 106 108  CO2 21*  --   GLUCOSE 156* 154*  BUN 46* 40*  CREATININE 2.62* 2.50*  CALCIUM 9.4  --   GFRNONAA 20*  --   GFRAA 24*  --   ANIONGAP 12  --     Recent Labs  Lab 10/10/17 0821  PROT 7.0  ALBUMIN 3.5  AST 27  ALT 7*  ALKPHOS 73  BILITOT 1.3*   Hematology Recent Labs    Lab 10/10/17 0821 10/10/17 0834  WBC 9.2  --   RBC 3.75*  --   HGB 11.5* 12.2*  HCT 35.7* 36.0*  MCV 95.2  --   MCH 30.7  --   MCHC 32.2  --   RDW 16.5*  --   PLT 215  --    Cardiac EnzymesNo results for input(s): TROPONINI in the last 168 hours.  Recent Labs  Lab 10/10/17 0833  TROPIPOC 0.12*    BNPNo results for input(s): BNP, PROBNP in the last 168 hours.  DDimer No results for input(s): DDIMER in the last 168 hours.  Radiology/Studies:  Ct Angio Head W Or Wo Contrast  Result Date: 10/10/2017 CLINICAL DATA:  84 year old male with aphasia.  Code stroke. EXAM: CT ANGIOGRAPHY HEAD AND NECK CT PERFUSION BRAIN TECHNIQUE: Multidetector CT imaging of the head and neck was performed using the standard protocol during bolus administration of intravenous contrast. Multiplanar CT image reconstructions and MIPs were obtained to evaluate the vascular anatomy. Carotid stenosis  measurements (when applicable) are obtained utilizing NASCET criteria, using the distal internal carotid diameter as the denominator. Multiphase CT imaging of the brain was performed following IV bolus contrast injection. Subsequent parametric perfusion maps were calculated using RAPID software. CONTRAST:  134mL ISOVUE-370 IOPAMIDOL (ISOVUE-370) INJECTION 76% COMPARISON:  Noncontrast head CT 0829 hours today. Cervical spine MRI 11/24/2011. FINDINGS: CT Brain Perfusion Findings: ASPECTS 10 at 0829 hours today. CBF (<30%) Volume: 19 milliliters Perfusion (Tmax>6.0s) volume: 33 milliliters Mismatch Volume: 14 milliliters Infarction Location:Middle left MCA territory/operculum CTA NECK Skeleton: No acute osseous abnormality identified. Generalized cervical spine degeneration. Upper chest: Small volume retained secretions in the visible trachea. Respiratory motion artifact in the lung apices but evidence of emphysema in the right lung apex. Incidental azygos fissure (normal variant). Other neck: Small volume retained secretions in  the hypopharynx. No neck mass or lymphadenopathy. Aortic arch: Aberrancy origin of the right subclavian artery, normal variant. Moderate Calcified aortic atherosclerosis. Right carotid system: No proximal right CCA stenosis despite soft plaque. Calcified plaque at the right carotid bifurcation and right ICA bulb with no significant stenosis. Moderately tortuous mid cervical right ICA with calcified plaque and a kinked appearance, but otherwise no stenosis to the skull base. Left carotid system: Left CCA origin atherosclerosis without stenosis. Fairly bulky left carotid bifurcation calcified plaque tracking into the left ICA origin and bulb. Stenosis at the distal bulb up to 60 % with respect to the distal vessel (series 11, image 135). Tortuosity and mild ectasia of the left ICA distal to the bulb but no additional stenosis to the skull base. Vertebral arteries: Aberrancy origin of the right subclavian artery with calcified plaque. No significant proximal subclavian stenosis. Calcified plaque at the right vertebral artery origin appears moderate to severe with patent but asymmetrically decreased enhancement of the right vertebral artery when compared to the left throughout the neck and to the skull base. No additional cervical right vertebral artery stenosis. No significant proximal left subclavian artery stenosis despite calcified plaque. At least moderate stenosis of the left vertebral artery origin due to calcified plaque. Left vertebral artery appears dominant and demonstrates superior enhancement versus the right. No additional cervical left vertebral stenosis. CTA HEAD Posterior circulation: Dominant distal left vertebral artery with patent left PICA origin. Asymmetrically decreased enhancement of the right vertebral artery V4 segment, although the right PICA origin remains patent. The distal right V4 appears occluded proximal to the vertebrobasilar junction (series 6, image 66). Patent basilar artery without  stenosis. Patent SCA origins. Normal left PCA origin. Fetal type right PCA origin. Left posterior communicating artery is diminutive. The bilateral PCA branches appear patent with mild irregularity. Anterior circulation: Both ICA siphons are patent. There is moderate bilateral calcified plaque with tortuous cavernous segments. There is mild cavernous left ICA stenosis. There is mild to moderate stenosis of the right ICA siphon proximal cavernous and proximal supraclinoid segments. Normal ophthalmic and posterior communicating artery origins. Patent carotid termini. Patent MCA and left ACA origins. The left ACA is dominant and the right is diminutive or absent. The anterior communicating artery is ectatic. The bilateral ACA branches are patent with mild irregularity. The right MCA M1 segment, bifurcation, and right MCA branches are patent with mild irregularity. The left MCA M1 segment is patent. At the left MCA trifurcation and anterior division M2 branch is occluded as seen on series 11, image 137. Of the patent M2 branches, there are M3 branch occlusions demonstrated on series 14, image 29. Venous sinuses: Insufficient venous contrast. Anatomic  variants: Dominant left vertebral artery. Dominant left ACA A1 segment. Review of the MIP images confirms the above findings IMPRESSION: 1. CTP estimates a middle Left MCA territory core infarct of 19 mL but only modest additional left MCA territory penumbra resulting in a relatively small mismatch estimated at 14 mL. 2. 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 Patent Left ICA and Left M1. 3. The above was discussed by telephone with Dr. Kerney Elbe on 10/10/2017 at 0906 hours. 4. Superimposed Left ICA calcified atherosclerosis with estimated 60% stenosis distal Left ICA bulb. Up to moderate contralateral right ICA siphon stenosis due to calcified plaque. 5. Dominant Left vertebral artery with mild to moderate origin stenosis. Patent basilar artery without stenosis.  6. Decreased enhancement of the Right Vertebral Artery relative to the left appears related to moderate to severe origin stenosis and occlusion of the distal right V4 segment. The right PICA origin remains patent. 7. Aortic Atherosclerosis (ICD10-I70.0) and Emphysema (ICD10-J43.9). Electronically Signed   By: Genevie Ann M.D.   On: 10/10/2017 09:30   Ct Angio Neck W Or Wo Contrast  Result Date: 10/10/2017 CLINICAL DATA:  84 year old male with aphasia.  Code stroke. EXAM: CT ANGIOGRAPHY HEAD AND NECK CT PERFUSION BRAIN TECHNIQUE: Multidetector CT imaging of the head and neck was performed using the standard protocol during bolus administration of intravenous contrast. Multiplanar CT image reconstructions and MIPs were obtained to evaluate the vascular anatomy. Carotid stenosis measurements (when applicable) are obtained utilizing NASCET criteria, using the distal internal carotid diameter as the denominator. Multiphase CT imaging of the brain was performed following IV bolus contrast injection. Subsequent parametric perfusion maps were calculated using RAPID software. CONTRAST:  160mL ISOVUE-370 IOPAMIDOL (ISOVUE-370) INJECTION 76% COMPARISON:  Noncontrast head CT 0829 hours today. Cervical spine MRI 11/24/2011. FINDINGS: CT Brain Perfusion Findings: ASPECTS 10 at 0829 hours today. CBF (<30%) Volume: 19 milliliters Perfusion (Tmax>6.0s) volume: 33 milliliters Mismatch Volume: 14 milliliters Infarction Location:Middle left MCA territory/operculum CTA NECK Skeleton: No acute osseous abnormality identified. Generalized cervical spine degeneration. Upper chest: Small volume retained secretions in the visible trachea. Respiratory motion artifact in the lung apices but evidence of emphysema in the right lung apex. Incidental azygos fissure (normal variant). Other neck: Small volume retained secretions in the hypopharynx. No neck mass or lymphadenopathy. Aortic arch: Aberrancy origin of the right subclavian artery, normal  variant. Moderate Calcified aortic atherosclerosis. Right carotid system: No proximal right CCA stenosis despite soft plaque. Calcified plaque at the right carotid bifurcation and right ICA bulb with no significant stenosis. Moderately tortuous mid cervical right ICA with calcified plaque and a kinked appearance, but otherwise no stenosis to the skull base. Left carotid system: Left CCA origin atherosclerosis without stenosis. Fairly bulky left carotid bifurcation calcified plaque tracking into the left ICA origin and bulb. Stenosis at the distal bulb up to 60 % with respect to the distal vessel (series 11, image 135). Tortuosity and mild ectasia of the left ICA distal to the bulb but no additional stenosis to the skull base. Vertebral arteries: Aberrancy origin of the right subclavian artery with calcified plaque. No significant proximal subclavian stenosis. Calcified plaque at the right vertebral artery origin appears moderate to severe with patent but asymmetrically decreased enhancement of the right vertebral artery when compared to the left throughout the neck and to the skull base. No additional cervical right vertebral artery stenosis. No significant proximal left subclavian artery stenosis despite calcified plaque. At least moderate stenosis of the left vertebral artery origin  due to calcified plaque. Left vertebral artery appears dominant and demonstrates superior enhancement versus the right. No additional cervical left vertebral stenosis. CTA HEAD Posterior circulation: Dominant distal left vertebral artery with patent left PICA origin. Asymmetrically decreased enhancement of the right vertebral artery V4 segment, although the right PICA origin remains patent. The distal right V4 appears occluded proximal to the vertebrobasilar junction (series 6, image 66). Patent basilar artery without stenosis. Patent SCA origins. Normal left PCA origin. Fetal type right PCA origin. Left posterior communicating artery  is diminutive. The bilateral PCA branches appear patent with mild irregularity. Anterior circulation: Both ICA siphons are patent. There is moderate bilateral calcified plaque with tortuous cavernous segments. There is mild cavernous left ICA stenosis. There is mild to moderate stenosis of the right ICA siphon proximal cavernous and proximal supraclinoid segments. Normal ophthalmic and posterior communicating artery origins. Patent carotid termini. Patent MCA and left ACA origins. The left ACA is dominant and the right is diminutive or absent. The anterior communicating artery is ectatic. The bilateral ACA branches are patent with mild irregularity. The right MCA M1 segment, bifurcation, and right MCA branches are patent with mild irregularity. The left MCA M1 segment is patent. At the left MCA trifurcation and anterior division M2 branch is occluded as seen on series 11, image 137. Of the patent M2 branches, there are M3 branch occlusions demonstrated on series 14, image 29. Venous sinuses: Insufficient venous contrast. Anatomic variants: Dominant left vertebral artery. Dominant left ACA A1 segment. Review of the MIP images confirms the above findings IMPRESSION: 1. CTP estimates a middle Left MCA territory core infarct of 19 mL but only modest additional left MCA territory penumbra resulting in a relatively small mismatch estimated at 14 mL. 2. 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 Patent Left ICA and Left M1. 3. The above was discussed by telephone with Dr. Kerney Elbe on 10/10/2017 at 0906 hours. 4. Superimposed Left ICA calcified atherosclerosis with estimated 60% stenosis distal Left ICA bulb. Up to moderate contralateral right ICA siphon stenosis due to calcified plaque. 5. Dominant Left vertebral artery with mild to moderate origin stenosis. Patent basilar artery without stenosis. 6. Decreased enhancement of the Right Vertebral Artery relative to the left appears related to moderate to severe  origin stenosis and occlusion of the distal right V4 segment. The right PICA origin remains patent. 7. Aortic Atherosclerosis (ICD10-I70.0) and Emphysema (ICD10-J43.9). Electronically Signed   By: Genevie Ann M.D.   On: 10/10/2017 09:30   Ct Cerebral Perfusion W Contrast  Result Date: 10/10/2017 CLINICAL DATA:  84 year old male with aphasia.  Code stroke. EXAM: CT ANGIOGRAPHY HEAD AND NECK CT PERFUSION BRAIN TECHNIQUE: Multidetector CT imaging of the head and neck was performed using the standard protocol during bolus administration of intravenous contrast. Multiplanar CT image reconstructions and MIPs were obtained to evaluate the vascular anatomy. Carotid stenosis measurements (when applicable) are obtained utilizing NASCET criteria, using the distal internal carotid diameter as the denominator. Multiphase CT imaging of the brain was performed following IV bolus contrast injection. Subsequent parametric perfusion maps were calculated using RAPID software. CONTRAST:  13mL ISOVUE-370 IOPAMIDOL (ISOVUE-370) INJECTION 76% COMPARISON:  Noncontrast head CT 0829 hours today. Cervical spine MRI 11/24/2011. FINDINGS: CT Brain Perfusion Findings: ASPECTS 10 at 0829 hours today. CBF (<30%) Volume: 19 milliliters Perfusion (Tmax>6.0s) volume: 33 milliliters Mismatch Volume: 14 milliliters Infarction Location:Middle left MCA territory/operculum CTA NECK Skeleton: No acute osseous abnormality identified. Generalized cervical spine degeneration. Upper chest: Small volume retained  secretions in the visible trachea. Respiratory motion artifact in the lung apices but evidence of emphysema in the right lung apex. Incidental azygos fissure (normal variant). Other neck: Small volume retained secretions in the hypopharynx. No neck mass or lymphadenopathy. Aortic arch: Aberrancy origin of the right subclavian artery, normal variant. Moderate Calcified aortic atherosclerosis. Right carotid system: No proximal right CCA stenosis despite  soft plaque. Calcified plaque at the right carotid bifurcation and right ICA bulb with no significant stenosis. Moderately tortuous mid cervical right ICA with calcified plaque and a kinked appearance, but otherwise no stenosis to the skull base. Left carotid system: Left CCA origin atherosclerosis without stenosis. Fairly bulky left carotid bifurcation calcified plaque tracking into the left ICA origin and bulb. Stenosis at the distal bulb up to 60 % with respect to the distal vessel (series 11, image 135). Tortuosity and mild ectasia of the left ICA distal to the bulb but no additional stenosis to the skull base. Vertebral arteries: Aberrancy origin of the right subclavian artery with calcified plaque. No significant proximal subclavian stenosis. Calcified plaque at the right vertebral artery origin appears moderate to severe with patent but asymmetrically decreased enhancement of the right vertebral artery when compared to the left throughout the neck and to the skull base. No additional cervical right vertebral artery stenosis. No significant proximal left subclavian artery stenosis despite calcified plaque. At least moderate stenosis of the left vertebral artery origin due to calcified plaque. Left vertebral artery appears dominant and demonstrates superior enhancement versus the right. No additional cervical left vertebral stenosis. CTA HEAD Posterior circulation: Dominant distal left vertebral artery with patent left PICA origin. Asymmetrically decreased enhancement of the right vertebral artery V4 segment, although the right PICA origin remains patent. The distal right V4 appears occluded proximal to the vertebrobasilar junction (series 6, image 66). Patent basilar artery without stenosis. Patent SCA origins. Normal left PCA origin. Fetal type right PCA origin. Left posterior communicating artery is diminutive. The bilateral PCA branches appear patent with mild irregularity. Anterior circulation: Both ICA  siphons are patent. There is moderate bilateral calcified plaque with tortuous cavernous segments. There is mild cavernous left ICA stenosis. There is mild to moderate stenosis of the right ICA siphon proximal cavernous and proximal supraclinoid segments. Normal ophthalmic and posterior communicating artery origins. Patent carotid termini. Patent MCA and left ACA origins. The left ACA is dominant and the right is diminutive or absent. The anterior communicating artery is ectatic. The bilateral ACA branches are patent with mild irregularity. The right MCA M1 segment, bifurcation, and right MCA branches are patent with mild irregularity. The left MCA M1 segment is patent. At the left MCA trifurcation and anterior division M2 branch is occluded as seen on series 11, image 137. Of the patent M2 branches, there are M3 branch occlusions demonstrated on series 14, image 29. Venous sinuses: Insufficient venous contrast. Anatomic variants: Dominant left vertebral artery. Dominant left ACA A1 segment. Review of the MIP images confirms the above findings IMPRESSION: 1. CTP estimates a middle Left MCA territory core infarct of 19 mL but only modest additional left MCA territory penumbra resulting in a relatively small mismatch estimated at 14 mL. 2. 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 Patent Left ICA and Left M1. 3. The above was discussed by telephone with Dr. Kerney Elbe on 10/10/2017 at 0906 hours. 4. Superimposed Left ICA calcified atherosclerosis with estimated 60% stenosis distal Left ICA bulb. Up to moderate contralateral right ICA siphon stenosis due to  calcified plaque. 5. Dominant Left vertebral artery with mild to moderate origin stenosis. Patent basilar artery without stenosis. 6. Decreased enhancement of the Right Vertebral Artery relative to the left appears related to moderate to severe origin stenosis and occlusion of the distal right V4 segment. The right PICA origin remains patent. 7. Aortic  Atherosclerosis (ICD10-I70.0) and Emphysema (ICD10-J43.9). Electronically Signed   By: Genevie Ann M.D.   On: 10/10/2017 09:30   Ct Head Code Stroke Wo Contrast  Result Date: 10/10/2017 CLINICAL DATA:  Code stroke.  84 year old male with aphasia. EXAM: CT HEAD WITHOUT CONTRAST TECHNIQUE: Contiguous axial images were obtained from the base of the skull through the vertex without intravenous contrast. COMPARISON:  Cervical spine MRI 11/24/2011. FINDINGS: Brain: No acute intracranial hemorrhage identified. No midline shift, mass effect, or evidence of intracranial mass lesion. Patchy bilateral cerebral white matter hypodensity, relatively mild for age. Some involvement of the basal ganglia, more so on the left. No cortically based acute hemispheric infarct identified, but there is patchy age indeterminate hypodensity in the central right cerebellum (series 7, image 7 and coronal image 54). No cortical encephalomalacia identified. Vascular: Calcified atherosclerosis at the skull base. No suspicious intracranial vascular hyperdensity. Skull: No acute osseous abnormality identified. Sinuses/Orbits: Well pneumatized. Other: Postoperative changes to both globes. No acute orbit or scalp soft tissue finding identified. ASPECTS Benitez Benitez Stroke Program Early CT Score) - Ganglionic level infarction (caudate, lentiform nuclei, internal capsule, insula, M1-M3 cortex): 7 - Supraganglionic infarction (M4-M6 cortex): 3 Total score (0-10 with 10 being normal): 10 IMPRESSION: 1. Age indeterminate mild patchy hypodensity in the right central cerebellum. No intracranial hemorrhage or acute cortically based infarct identified. 2. ASPECTS is 10. 3. These results were communicated to Dr. Cheral Benitez at 8:39 amon 5/6/2019by text page via the [facility] messaging system. Electronically Signed   By: Genevie Ann M.D.   On: 10/10/2017 08:40    Assessment and Plan:   Abnormal EKG -In the setting of a profound CVA. ?Possible new onset of afib on EMS  rhythm strip. EKG in ED shows long 1st degree AVB with known RBBB and LAFB.  -Pt with previously known RBBB and LAFB. Now with TWI in V1-3. Discordant TWI can be normal in BBB so is of unclear significance  -CVD risk factors include prior CAD, 50 pack year smoker, HTN, DM, HLD -Troponin is mildly elevated at 0.12 which can be related to the CVA. -The patient appears to have had a devastating stroke and is currently not a candidate for myocardial testing.  -Per neurology they are considering switching from DAPT to anticoagulation in 5-7 days if not at increased fall risk.  -continue to cycle troponins and obtain Echo.  -Will assess for atrial fibrillation on telemetry and consider anticoagulation if afib present and after he recovers more from his stroke.   CAD -Hx MI and multiple PCI/stents. Last cath done in 01/2014 with complex atherectomy and DES X2 to RCA -Has been treated with DAPT with aspirin and plavix. He is continued on aspirin, but plavix has been discontinued.   CVA -Pt found unconscious on the floor this am. Now with aphagia and right sided hemiplegia. -CT negative for hemorrhage but appears to indicate MCA distribution stroke per IM. He is ordered for MRI, carotid dopplers, Echo.  -Primary team indicates that the patient has potentially devastating CVA and unless he has significant improvement over then next few days is likely to need total care and possibly hospice.   Hypertension -Allowing permissive hypertension is  setting of stroke. Plans to treat BP only if >220/120, then with goal of 15% reduction.  -Holding lopressor and captopril with plan to restart in 48-72 hrs. Per primary team -BP is currently stable.   Hyperlipidemia -Treated with Zocor 80 mg daily. Changed to high intensity statin, atorvastatin 80 mg daily if he is able to swallow, per primary team. -FLP ordered   For questions or updates, please contact [facility] Please consult [email or web address] for contact  info under Cardiology/STEMI.   Signed, Daune Perch, NP  10/10/2017 12:27 PM

## 2017-10-10 NOTE — ED Provider Notes (Signed)
[facility] EMERGENCY DEPARTMENT Provider Note  CSN: [id number] Arrival date & time: 10/10/17 4332  Chief Complaint(s) Code Stroke  HPI Ronald Benitez is a 84 y.o. male with a history of hypertension, hyperlipidemia, diabetes, CKD, CAD status post stenting who presents to the emergency department with right-sided weakness and altered mental status.  Last known normal was 0108am this morning.  EMS called out for altered mental status.   per EMS wife was unable to provide a lot of detailed history.  EMS did note the right-sided deficits with altered mental status/decreased responsiveness.  Airway was intact and patient was hemodynamically stable.  Code stroke was initiated in route.  Remainder of history, ROS, and physical exam limited due to patient's condition (AMS). Additional information was obtained from EMS.   Level V Caveat.    HPI  Past Medical History Past Medical History:  Diagnosis Date  . Arthritis   . BPH (benign prostatic hyperplasia)   . Bruises easily   . Bruising    pt fell approx. 3 wks ago (3 wk in june 2018) has bruising down the back  . CAD (coronary artery disease) cardiologist-  dr berry   a. 01/16/14 s/p DES x2 to RCA. b remote RCA stenting 03/ 1997; 04/ 1997 stenting OM2;  11/ 1997  stenting pCFX, OM2 (restenosed), angioplasty OM1   . CKD (chronic kidney disease), stage III ([place])   . COPD mixed type [facility]) pulmologist-  dr byrum   mild obstructive and restrictive  . Depression   . Diverticulosis of colon   . Dyspnea   . First degree heart block   . GERD (gastroesophageal reflux disease)   . Hearing loss of both ears   . Hiatal hernia   . History of adenomatous polyp of colon    last colonoscopy 12-06-2016 tubular adenoma w/ high grade hyperplasia (cecal polyp),  tubular adenoma not high grade dysplasia (bx),  tubulovillious adenoma's not high grade dysplasia (sigmoid polyp x2)  . History of coronary artery stent placement    03/  1997;  04/ 1997;  11/ 1997  (unknown type)  . History of non-ST elevation myocardial infarction (NSTEMI) 03/01/2000  . HTN (hypertension)   . Hyperlipidemia   . Idiopathic Parkinson's disease [facility])    neurologist-  dr tat  . Right bundle branch block (RBBB) with left anterior fascicular block (LAFB)   . S/P drug eluting coronary stent placement    01-15-2017  . Sebaceous cyst    chronic of buttock-- per pt open area size tennis ball  . Speech impediment   . Type 2 diabetes mellitus ([place])   . Unsteady gait   . Wears glasses    Patient Active Problem List   Diagnosis Date Noted  . Abnormal CT of the abdomen 12/06/2016  . Change in bowel habits 12/06/2016  . CN (constipation) 12/06/2016  . COPD, mild ([place]) 04/09/2014  . Unstable angina ([place]) 01/17/2014  . Mitral regurgitation   . GERD (gastroesophageal reflux disease)   . DM (diabetes mellitus) ([place])   . Asymptomatic LV dysfunction   . CAD (coronary artery disease) 01/15/2014  . Abnormal nuclear cardiac imaging test 01/14/2014  . Chronic renal disease, stage III ([place]) 01/14/2014  . Sinus bradycardia- HR 43 01/14/2014  . Essential hypertension 11/16/2013  . Hyperlipidemia 11/16/2013  . Depressive disorder, not elsewhere classified 05/08/2013   Home Medication(s) Prior to Admission medications   Medication Sig Start Date End Date Taking? Authorizing Provider  aspirin EC 81 MG  EC tablet Take 1 tablet (81 mg total) by mouth daily. 01/17/14  Yes Eileen Stanford, PA-C  B Complex Vitamins (VITAMIN B COMPLEX PO) Take 1 tablet by mouth every evening.   Yes [provider]  captopril (CAPOTEN) 12.5 MG tablet Take 12.5 mg by mouth at bedtime.  11/05/13  Yes [provider]  carbidopa-levodopa (SINEMET IR) 25-100 MG tablet 2 in the morning, 2 in the afternoon, 1 in the evening 09/02/17  Yes Tat, Eustace Quail, DO  Cholecalciferol (VITAMIN D3) 2000 units capsule Take 2,000 Units by mouth daily.   Yes [provider]    clopidogrel (PLAVIX) 75 MG tablet Take 1 tablet (75 mg total) by mouth daily with breakfast. Contact our office for an appointment 09/16/17  Yes Lorretta Harp, MD  Coenzyme Q10 (COQ-10) 200 MG CAPS Take 200 mg by mouth daily.   Yes [provider]  Cyanocobalamin (VITAMIN B-12) 5000 MCG TBDP Take 5,000 mcg by mouth daily with supper.   Yes [provider]  finasteride (PROSCAR) 5 MG tablet Take 5 mg by mouth daily after supper.  05/25/13  Yes [provider]  glimepiride (AMARYL) 2 MG tablet Take 2 mg by mouth daily with breakfast.   Yes [provider]  Glucos-Chond-Hyal Ac-Ca Fructo ([place]) TABS Take 1 tablet by mouth 2 (two) times daily.   Yes [provider]  Homeopathic Products (ARNICARE ARNICA EX) Apply 1 application topically daily as needed (joint pain).   Yes [provider]  metoprolol (LOPRESSOR) 25 MG tablet Take 1 tablet (25 mg total) by mouth 2 (two) times daily. 01/17/14  Yes Eileen Stanford, PA-C  Multiple Vitamins-Minerals (ICAPS) CAPS Take 1 capsule by mouth 2 (two) times daily.    Yes [provider]  Omega-3 Fatty Acids (FISH OIL) 1000 MG CAPS Take 1,000 mg by mouth daily.   Yes [provider]  polyethylene glycol (MIRALAX / GLYCOLAX) packet Take 17 g by mouth daily.    Yes [provider]  Sennosides-Docusate Sodium (STOOL SOFTENER & LAXATIVE PO) Take 1 tablet by mouth 2 (two) times daily.    Yes [provider]  simvastatin (ZOCOR) 80 MG tablet Take 80 mg by mouth at bedtime.    Yes [provider]  tamsulosin (FLOMAX) 0.4 MG CAPS capsule Take 0.4 mg by mouth daily with supper. 11/16/16  Yes [provider]  vitamin C (ASCORBIC ACID) 500 MG tablet Take 500 mg by mouth daily.   Yes [provider]  vitamin E 400 UNIT capsule Take 400 Units by mouth daily.   Yes [provider]  glimepiride (AMARYL) 2 MG tablet Take 4mg  by  mouth every morning 09/13/16   [provider]  nitroGLYCERIN (NITROSTAT) 0.4 MG SL tablet Place 1 tablet (0.4 mg total) under the tongue every 5 (five) minutes x 3 doses as needed for chest pain. Patient not taking: Reported on 06/23/2017 01/17/14   Eileen Stanford, PA-C  traMADol (ULTRAM) 50 MG tablet Take 1-2 tablets (50-100 mg total) by mouth every 6 (six) hours as needed for moderate pain or severe pain. 12/15/16   Michael Boston, MD  Past Surgical History Past Surgical History:  Procedure Laterality Date  . CARDIAC CATHETERIZATION  02-10-2000  dr Daneen Schick   high-grade functional total occludion of PDA, high-grade obstruction in branch of the acute margainal branch of RCA,  total occlusion of branch of the D1 with high-grade obstruction in the second branch of D1; mLAD 50%,  third obtuse marginal branch 70-80%; all of these sites of stenosis in the branch vessels are not signifinantly changed from cardiac cath. after PCI 1997,except acute marginal br   . CARDIAC CATHETERIZATION  05-10-2005  dr Tamala Julian   severe CAD w/ total occlusion D1, high-grade obstruction in acute marginal of RCA and PDA, high-grade ostruction in more superior branch of OM1 w/ wide patency of second larger branch of OM1;  continuation severe diffuse disease CFX , has very limited distribution, LAD main portion patent as is prox., mid, & distal RCA; ef 50% w/ mild anterolateral hypokinesis   . CARDIOVASCULAR STRESS TEST  12-12-2013  dr berry   Intermediate nuclear study w/ significant reversible lateral , apical lateral defect which may represent diagonal vessel ischemia/ normal LV wall motion,  lateral hypokinesis w/ ef 48%  . CATARACT EXTRACTION W/ INTRAOCULAR LENS IMPLANT Bilateral 2009 approx.  . COLONOSCOPY WITH PROPOFOL N/A 12/06/2016   Procedure: COLONOSCOPY WITH PROPOFOL;  Surgeon:  Wilford Corner, MD;  Location: [facility];  Service: Endoscopy;  Laterality: N/A;  . CORONARY ANGIOPLASTY WITH STENT PLACEMENT  08/1995  dr Daneen Schick   stenting to RCA  . CORONARY ANGIOPLASTY WITH STENT PLACEMENT  04/ 1997  dr Daneen Schick   stenting to [place] WITH STENT PLACEMENT  11/ 1997  dr Daneen Schick   stenting to proxCFX, OM2 (restenosed), PTCA to OM1  . ESOPHAGOGASTRODUODENOSCOPY ENDOSCOPY    . LEFT HEART CATHETERIZATION WITH CORONARY ANGIOGRAM N/A 01/15/2014   Procedure: LEFT HEART CATHETERIZATION WITH CORONARY ANGIOGRAM;  Surgeon: Lorretta Harp, MD;  Location: [facility] CATH LAB;  Service: Cardiovascular;  Laterality: N/A; occluded moderate size diagonal branch which fills by collaterals faintly;  patent mid AV groove CFX stent, patent mRCA stent w/ high-grade tandem stenosis's in the mid and distal portion  . MASS EXCISION Left 12/15/2016   Procedure: REMOVAL OF LEFT GLUTEAL SUBCUTANEOUS MASS;  Surgeon: Michael Boston, MD;  Location: [facility];  Service: General;  Laterality: Left;  . OPEN INGUINAL HERNIA REPAIR W/ MESH Right 01/ 24/ 2012   dr Dalbert Batman  . PERCUTANEOUS CORONARY ROTOBLATOR INTERVENTION (PCI-R) N/A 01/16/2014   Procedure: PERCUTANEOUS CORONARY ROTOBLATOR INTERVENTION (PCI-R);  Surgeon: Troy Sine, MD;  Location[facility];  Service: Cardiovascular;  Laterality: N/A;  PTCI to RCA and DES x2 to mid and distal RCA  . TONSILLECTOMY  child  . TRANSTHORACIC ECHOCARDIOGRAM  12-06-2013   dr berry   mild focal basal hypertrophy of the septum,  ef 50-55%,  grade 3 diastolic dysfunction/  mild AR/ mild to moderate MR/  increased thickness of the atrial septum consistant with lipomatous hypertrophy and very small PFO (agitated saline contrast study showed small right-to-left atrial level shunt/  trivial PR/  mild RAE    Family History Family History  Problem Relation Age of Onset  . Heart attack Mother   . Heart disease Father   . Lung cancer Sister   . COPD  Sister     Social History Social History   Tobacco Use  . Smoking status: Former Smoker    Packs/day: 1.50    Years: 60.00  Pack years: 90.00    Types: Cigarettes, Cigars    Last attempt to quit: 05/07/2013    Years since quitting: 4.4  . Smokeless tobacco: Never Used  . Tobacco comment: quit cigarettes 1985 1.5 PPD, quit cigars 05/2013-- 5 cigars per day  Substance Use Topics  . Alcohol use: No  . Drug use: No   Allergies Patient has no known allergies.  Review of Systems Review of Systems  Unable to perform ROS: Mental status change    Physical Exam Vital Signs  I have reviewed the triage vital signs BP (!) 164/79   Pulse 83   Resp (!) 22   Ht 5\' 11"  (1.803 m)   Wt 76.8 kg (169 lb 5 oz)   SpO2 98%   BMI 23.61 kg/m   Physical Exam  Constitutional: He appears well-developed and well-nourished. No distress.  HENT:  Head: Normocephalic and atraumatic.  Nose: Nose normal.  Eyes: Pupils are equal, round, and reactive to light. Conjunctivae and EOM are normal. Right eye exhibits no discharge. Left eye exhibits no discharge. No scleral icterus.  Neck: Normal range of motion. Neck supple.  Cardiovascular: Normal rate and regular rhythm. Exam reveals no gallop and no friction rub.  No murmur heard. Pulmonary/Chest: Effort normal and breath sounds normal. No stridor. No respiratory distress. He has no rales.  Abdominal: Soft. He exhibits no distension. There is no tenderness.  Musculoskeletal: He exhibits no edema or tenderness.  Neurological: He is alert.  Nonverbal. Not following comands. ?right facial weakness. Right UE and LE hemiplegia.  Skin: Skin is warm and dry. No rash noted. He is not diaphoretic. No erythema.  Psychiatric: He has a normal mood and affect.  Vitals reviewed.   ED Results and Treatments Labs (all labs ordered are listed, but only abnormal results are displayed) Labs Reviewed  CBC - Abnormal; Notable for the following components:       Result Value   RBC 3.75 (*)    Hemoglobin 11.5 (*)    HCT 35.7 (*)    RDW 16.5 (*)    All other components within normal limits  COMPREHENSIVE METABOLIC PANEL - Abnormal; Notable for the following components:   CO2 21 (*)    Glucose, Bld 156 (*)    BUN 46 (*)    Creatinine, Ser 2.62 (*)    ALT 7 (*)    Total Bilirubin 1.3 (*)    GFR calc non Af Amer 20 (*)    GFR calc Af Amer 24 (*)    All other components within normal limits  I-STAT CHEM 8, ED - Abnormal; Notable for the following components:   BUN 40 (*)    Creatinine, Ser 2.50 (*)    Glucose, Bld 154 (*)    TCO2 21 (*)    Hemoglobin 12.2 (*)    HCT 36.0 (*)    All other components within normal limits  I-STAT TROPONIN, ED - Abnormal; Notable for the following components:   Troponin i, poc 0.12 (*)    All other components within normal limits  CBG MONITORING, ED - Abnormal; Notable for the following components:   Glucose-Capillary 130 (*)    All other components within normal limits  PROTIME-INR  APTT  DIFFERENTIAL  RAPID URINE DRUG SCREEN, HOSP PERFORMED  URINALYSIS, ROUTINE W REFLEX MICROSCOPIC  ETHANOL  EKG  EKG Interpretation  Date/Time:  Monday Oct 10 2017 08:56:43 EDT Ventricular Rate:  87 PR Interval:    QRS Duration: 153 QT Interval:  408 QTC Calculation: 491 R Axis:   -80 Text Interpretation:  Sinus rhythm Short PR interval RBBB and LAFB Abnormal T, consider ischemia, lateral leads NEW TWI  IN anteroseptal leads when compared to prior ? STD ANTERIORLY Confirmed by Addison Lank [phone]) on 10/10/2017 10:31:11 AM      Radiology Ct Angio Head W Or Wo Contrast  Result Date: 10/10/2017 CLINICAL DATA:  84 year old male with aphasia.  Code stroke. EXAM: CT ANGIOGRAPHY HEAD AND NECK CT PERFUSION BRAIN TECHNIQUE: Multidetector CT imaging of the head and neck was performed using the standard  protocol during bolus administration of intravenous contrast. Multiplanar CT image reconstructions and MIPs were obtained to evaluate the vascular anatomy. Carotid stenosis measurements (when applicable) are obtained utilizing NASCET criteria, using the distal internal carotid diameter as the denominator. Multiphase CT imaging of the brain was performed following IV bolus contrast injection. Subsequent parametric perfusion maps were calculated using RAPID software. CONTRAST:  156mL ISOVUE-370 IOPAMIDOL (ISOVUE-370) INJECTION 76% COMPARISON:  Noncontrast head CT 0829 hours today. Cervical spine MRI 11/24/2011. FINDINGS: CT Brain Perfusion Findings: ASPECTS 10 at 0829 hours today. CBF (<30%) Volume: 19 milliliters Perfusion (Tmax>6.0s) volume: 33 milliliters Mismatch Volume: 14 milliliters Infarction Location:Middle left MCA territory/operculum CTA NECK Skeleton: No acute osseous abnormality identified. Generalized cervical spine degeneration. Upper chest: Small volume retained secretions in the visible trachea. Respiratory motion artifact in the lung apices but evidence of emphysema in the right lung apex. Incidental azygos fissure (normal variant). Other neck: Small volume retained secretions in the hypopharynx. No neck mass or lymphadenopathy. Aortic arch: Aberrancy origin of the right subclavian artery, normal variant. Moderate Calcified aortic atherosclerosis. Right carotid system: No proximal right CCA stenosis despite soft plaque. Calcified plaque at the right carotid bifurcation and right ICA bulb with no significant stenosis. Moderately tortuous mid cervical right ICA with calcified plaque and a kinked appearance, but otherwise no stenosis to the skull base. Left carotid system: Left CCA origin atherosclerosis without stenosis. Fairly bulky left carotid bifurcation calcified plaque tracking into the left ICA origin and bulb. Stenosis at the distal bulb up to 60 % with respect to the distal vessel (series 11,  image 135). Tortuosity and mild ectasia of the left ICA distal to the bulb but no additional stenosis to the skull base. Vertebral arteries: Aberrancy origin of the right subclavian artery with calcified plaque. No significant proximal subclavian stenosis. Calcified plaque at the right vertebral artery origin appears moderate to severe with patent but asymmetrically decreased enhancement of the right vertebral artery when compared to the left throughout the neck and to the skull base. No additional cervical right vertebral artery stenosis. No significant proximal left subclavian artery stenosis despite calcified plaque. At least moderate stenosis of the left vertebral artery origin due to calcified plaque. Left vertebral artery appears dominant and demonstrates superior enhancement versus the right. No additional cervical left vertebral stenosis. CTA HEAD Posterior circulation: Dominant distal left vertebral artery with patent left PICA origin. Asymmetrically decreased enhancement of the right vertebral artery V4 segment, although the right PICA origin remains patent. The distal right V4 appears occluded proximal to the vertebrobasilar junction (series 6, image 66). Patent basilar artery without stenosis. Patent SCA origins. Normal left PCA origin. Fetal type right PCA origin. Left posterior communicating artery is diminutive. The bilateral PCA branches appear patent with mild  irregularity. Anterior circulation: Both ICA siphons are patent. There is moderate bilateral calcified plaque with tortuous cavernous segments. There is mild cavernous left ICA stenosis. There is mild to moderate stenosis of the right ICA siphon proximal cavernous and proximal supraclinoid segments. Normal ophthalmic and posterior communicating artery origins. Patent carotid termini. Patent MCA and left ACA origins. The left ACA is dominant and the right is diminutive or absent. The anterior communicating artery is ectatic. The bilateral ACA  branches are patent with mild irregularity. The right MCA M1 segment, bifurcation, and right MCA branches are patent with mild irregularity. The left MCA M1 segment is patent. At the left MCA trifurcation and anterior division M2 branch is occluded as seen on series 11, image 137. Of the patent M2 branches, there are M3 branch occlusions demonstrated on series 14, image 29. Venous sinuses: Insufficient venous contrast. Anatomic variants: Dominant left vertebral artery. Dominant left ACA A1 segment. Review of the MIP images confirms the above findings IMPRESSION: 1. CTP estimates a middle Left MCA territory core infarct of 19 mL but only modest additional left MCA territory penumbra resulting in a relatively small mismatch estimated at 14 mL. 2. 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 Patent Left ICA and Left M1. 3. The above was discussed by telephone with Dr. Kerney Elbe on 10/10/2017 at 0906 hours. 4. Superimposed Left ICA calcified atherosclerosis with estimated 60% stenosis distal Left ICA bulb. Up to moderate contralateral right ICA siphon stenosis due to calcified plaque. 5. Dominant Left vertebral artery with mild to moderate origin stenosis. Patent basilar artery without stenosis. 6. Decreased enhancement of the Right Vertebral Artery relative to the left appears related to moderate to severe origin stenosis and occlusion of the distal right V4 segment. The right PICA origin remains patent. 7. Aortic Atherosclerosis (ICD10-I70.0) and Emphysema (ICD10-J43.9). Electronically Signed   By: Genevie Ann M.D.   On: 10/10/2017 09:30   Ct Angio Neck W Or Wo Contrast  Result Date: 10/10/2017 CLINICAL DATA:  84 year old male with aphasia.  Code stroke. EXAM: CT ANGIOGRAPHY HEAD AND NECK CT PERFUSION BRAIN TECHNIQUE: Multidetector CT imaging of the head and neck was performed using the standard protocol during bolus administration of intravenous contrast. Multiplanar CT image reconstructions and MIPs were  obtained to evaluate the vascular anatomy. Carotid stenosis measurements (when applicable) are obtained utilizing NASCET criteria, using the distal internal carotid diameter as the denominator. Multiphase CT imaging of the brain was performed following IV bolus contrast injection. Subsequent parametric perfusion maps were calculated using RAPID software. CONTRAST:  115mL ISOVUE-370 IOPAMIDOL (ISOVUE-370) INJECTION 76% COMPARISON:  Noncontrast head CT 0829 hours today. Cervical spine MRI 11/24/2011. FINDINGS: CT Brain Perfusion Findings: ASPECTS 10 at 0829 hours today. CBF (<30%) Volume: 19 milliliters Perfusion (Tmax>6.0s) volume: 33 milliliters Mismatch Volume: 14 milliliters Infarction Location:Middle left MCA territory/operculum CTA NECK Skeleton: No acute osseous abnormality identified. Generalized cervical spine degeneration. Upper chest: Small volume retained secretions in the visible trachea. Respiratory motion artifact in the lung apices but evidence of emphysema in the right lung apex. Incidental azygos fissure (normal variant). Other neck: Small volume retained secretions in the hypopharynx. No neck mass or lymphadenopathy. Aortic arch: Aberrancy origin of the right subclavian artery, normal variant. Moderate Calcified aortic atherosclerosis. Right carotid system: No proximal right CCA stenosis despite soft plaque. Calcified plaque at the right carotid bifurcation and right ICA bulb with no significant stenosis. Moderately tortuous mid cervical right ICA with calcified plaque and a kinked appearance, but otherwise no  stenosis to the skull base. Left carotid system: Left CCA origin atherosclerosis without stenosis. Fairly bulky left carotid bifurcation calcified plaque tracking into the left ICA origin and bulb. Stenosis at the distal bulb up to 60 % with respect to the distal vessel (series 11, image 135). Tortuosity and mild ectasia of the left ICA distal to the bulb but no additional stenosis to the  skull base. Vertebral arteries: Aberrancy origin of the right subclavian artery with calcified plaque. No significant proximal subclavian stenosis. Calcified plaque at the right vertebral artery origin appears moderate to severe with patent but asymmetrically decreased enhancement of the right vertebral artery when compared to the left throughout the neck and to the skull base. No additional cervical right vertebral artery stenosis. No significant proximal left subclavian artery stenosis despite calcified plaque. At least moderate stenosis of the left vertebral artery origin due to calcified plaque. Left vertebral artery appears dominant and demonstrates superior enhancement versus the right. No additional cervical left vertebral stenosis. CTA HEAD Posterior circulation: Dominant distal left vertebral artery with patent left PICA origin. Asymmetrically decreased enhancement of the right vertebral artery V4 segment, although the right PICA origin remains patent. The distal right V4 appears occluded proximal to the vertebrobasilar junction (series 6, image 66). Patent basilar artery without stenosis. Patent SCA origins. Normal left PCA origin. Fetal type right PCA origin. Left posterior communicating artery is diminutive. The bilateral PCA branches appear patent with mild irregularity. Anterior circulation: Both ICA siphons are patent. There is moderate bilateral calcified plaque with tortuous cavernous segments. There is mild cavernous left ICA stenosis. There is mild to moderate stenosis of the right ICA siphon proximal cavernous and proximal supraclinoid segments. Normal ophthalmic and posterior communicating artery origins. Patent carotid termini. Patent MCA and left ACA origins. The left ACA is dominant and the right is diminutive or absent. The anterior communicating artery is ectatic. The bilateral ACA branches are patent with mild irregularity. The right MCA M1 segment, bifurcation, and right MCA branches are  patent with mild irregularity. The left MCA M1 segment is patent. At the left MCA trifurcation and anterior division M2 branch is occluded as seen on series 11, image 137. Of the patent M2 branches, there are M3 branch occlusions demonstrated on series 14, image 29. Venous sinuses: Insufficient venous contrast. Anatomic variants: Dominant left vertebral artery. Dominant left ACA A1 segment. Review of the MIP images confirms the above findings IMPRESSION: 1. CTP estimates a middle Left MCA territory core infarct of 19 mL but only modest additional left MCA territory penumbra resulting in a relatively small mismatch estimated at 14 mL. 2. 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 Patent Left ICA and Left M1. 3. The above was discussed by telephone with Dr. Kerney Elbe on 10/10/2017 at 0906 hours. 4. Superimposed Left ICA calcified atherosclerosis with estimated 60% stenosis distal Left ICA bulb. Up to moderate contralateral right ICA siphon stenosis due to calcified plaque. 5. Dominant Left vertebral artery with mild to moderate origin stenosis. Patent basilar artery without stenosis. 6. Decreased enhancement of the Right Vertebral Artery relative to the left appears related to moderate to severe origin stenosis and occlusion of the distal right V4 segment. The right PICA origin remains patent. 7. Aortic Atherosclerosis (ICD10-I70.0) and Emphysema (ICD10-J43.9). Electronically Signed   By: Genevie Ann M.D.   On: 10/10/2017 09:30   Ct Cerebral Perfusion W Contrast  Result Date: 10/10/2017 CLINICAL DATA:  84 year old male with aphasia.  Code stroke. EXAM: CT ANGIOGRAPHY  HEAD AND NECK CT PERFUSION BRAIN TECHNIQUE: Multidetector CT imaging of the head and neck was performed using the standard protocol during bolus administration of intravenous contrast. Multiplanar CT image reconstructions and MIPs were obtained to evaluate the vascular anatomy. Carotid stenosis measurements (when applicable) are obtained utilizing  NASCET criteria, using the distal internal carotid diameter as the denominator. Multiphase CT imaging of the brain was performed following IV bolus contrast injection. Subsequent parametric perfusion maps were calculated using RAPID software. CONTRAST:  163mL ISOVUE-370 IOPAMIDOL (ISOVUE-370) INJECTION 76% COMPARISON:  Noncontrast head CT 0829 hours today. Cervical spine MRI 11/24/2011. FINDINGS: CT Brain Perfusion Findings: ASPECTS 10 at 0829 hours today. CBF (<30%) Volume: 19 milliliters Perfusion (Tmax>6.0s) volume: 33 milliliters Mismatch Volume: 14 milliliters Infarction Location:Middle left MCA territory/operculum CTA NECK Skeleton: No acute osseous abnormality identified. Generalized cervical spine degeneration. Upper chest: Small volume retained secretions in the visible trachea. Respiratory motion artifact in the lung apices but evidence of emphysema in the right lung apex. Incidental azygos fissure (normal variant). Other neck: Small volume retained secretions in the hypopharynx. No neck mass or lymphadenopathy. Aortic arch: Aberrancy origin of the right subclavian artery, normal variant. Moderate Calcified aortic atherosclerosis. Right carotid system: No proximal right CCA stenosis despite soft plaque. Calcified plaque at the right carotid bifurcation and right ICA bulb with no significant stenosis. Moderately tortuous mid cervical right ICA with calcified plaque and a kinked appearance, but otherwise no stenosis to the skull base. Left carotid system: Left CCA origin atherosclerosis without stenosis. Fairly bulky left carotid bifurcation calcified plaque tracking into the left ICA origin and bulb. Stenosis at the distal bulb up to 60 % with respect to the distal vessel (series 11, image 135). Tortuosity and mild ectasia of the left ICA distal to the bulb but no additional stenosis to the skull base. Vertebral arteries: Aberrancy origin of the right subclavian artery with calcified plaque. No significant  proximal subclavian stenosis. Calcified plaque at the right vertebral artery origin appears moderate to severe with patent but asymmetrically decreased enhancement of the right vertebral artery when compared to the left throughout the neck and to the skull base. No additional cervical right vertebral artery stenosis. No significant proximal left subclavian artery stenosis despite calcified plaque. At least moderate stenosis of the left vertebral artery origin due to calcified plaque. Left vertebral artery appears dominant and demonstrates superior enhancement versus the right. No additional cervical left vertebral stenosis. CTA HEAD Posterior circulation: Dominant distal left vertebral artery with patent left PICA origin. Asymmetrically decreased enhancement of the right vertebral artery V4 segment, although the right PICA origin remains patent. The distal right V4 appears occluded proximal to the vertebrobasilar junction (series 6, image 66). Patent basilar artery without stenosis. Patent SCA origins. Normal left PCA origin. Fetal type right PCA origin. Left posterior communicating artery is diminutive. The bilateral PCA branches appear patent with mild irregularity. Anterior circulation: Both ICA siphons are patent. There is moderate bilateral calcified plaque with tortuous cavernous segments. There is mild cavernous left ICA stenosis. There is mild to moderate stenosis of the right ICA siphon proximal cavernous and proximal supraclinoid segments. Normal ophthalmic and posterior communicating artery origins. Patent carotid termini. Patent MCA and left ACA origins. The left ACA is dominant and the right is diminutive or absent. The anterior communicating artery is ectatic. The bilateral ACA branches are patent with mild irregularity. The right MCA M1 segment, bifurcation, and right MCA branches are patent with mild irregularity. The left MCA M1 segment is  patent. At the left MCA trifurcation and anterior division  M2 branch is occluded as seen on series 11, image 137. Of the patent M2 branches, there are M3 branch occlusions demonstrated on series 14, image 29. Venous sinuses: Insufficient venous contrast. Anatomic variants: Dominant left vertebral artery. Dominant left ACA A1 segment. Review of the MIP images confirms the above findings IMPRESSION: 1. CTP estimates a middle Left MCA territory core infarct of 19 mL but only modest additional left MCA territory penumbra resulting in a relatively small mismatch estimated at 14 mL. 2. 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 Patent Left ICA and Left M1. 3. The above was discussed by telephone with Dr. Kerney Elbe on 10/10/2017 at 0906 hours. 4. Superimposed Left ICA calcified atherosclerosis with estimated 60% stenosis distal Left ICA bulb. Up to moderate contralateral right ICA siphon stenosis due to calcified plaque. 5. Dominant Left vertebral artery with mild to moderate origin stenosis. Patent basilar artery without stenosis. 6. Decreased enhancement of the Right Vertebral Artery relative to the left appears related to moderate to severe origin stenosis and occlusion of the distal right V4 segment. The right PICA origin remains patent. 7. Aortic Atherosclerosis (ICD10-I70.0) and Emphysema (ICD10-J43.9). Electronically Signed   By: Genevie Ann M.D.   On: 10/10/2017 09:30   Ct Head Code Stroke Wo Contrast  Result Date: 10/10/2017 CLINICAL DATA:  Code stroke.  84 year old male with aphasia. EXAM: CT HEAD WITHOUT CONTRAST TECHNIQUE: Contiguous axial images were obtained from the base of the skull through the vertex without intravenous contrast. COMPARISON:  Cervical spine MRI 11/24/2011. FINDINGS: Brain: No acute intracranial hemorrhage identified. No midline shift, mass effect, or evidence of intracranial mass lesion. Patchy bilateral cerebral white matter hypodensity, relatively mild for age. Some involvement of the basal ganglia, more so on the left. No cortically based  acute hemispheric infarct identified, but there is patchy age indeterminate hypodensity in the central right cerebellum (series 7, image 7 and coronal image 54). No cortical encephalomalacia identified. Vascular: Calcified atherosclerosis at the skull base. No suspicious intracranial vascular hyperdensity. Skull: No acute osseous abnormality identified. Sinuses/Orbits: Well pneumatized. Other: Postoperative changes to both globes. No acute orbit or scalp soft tissue finding identified. ASPECTS [facility] Stroke Program Early CT Score) - Ganglionic level infarction (caudate, lentiform nuclei, internal capsule, insula, M1-M3 cortex): 7 - Supraganglionic infarction (M4-M6 cortex): 3 Total score (0-10 with 10 being normal): 10 IMPRESSION: 1. Age indeterminate mild patchy hypodensity in the right central cerebellum. No intracranial hemorrhage or acute cortically based infarct identified. 2. ASPECTS is 10. 3. These results were communicated to Dr. Cheral Marker at 8:39 amon 5/6/2019by text page via the [facility] messaging system. Electronically Signed   By: Genevie Ann M.D.   On: 10/10/2017 08:40   Pertinent labs & imaging results that were available during my care of the patient were reviewed by me and considered in my medical decision making (see chart for details).  Medications Ordered in ED Medications  iopamidol (ISOVUE-370) 76 % injection 100 mL (100 mLs Intravenous Contrast Given 10/10/17 0910)  Procedures Procedures CRITICAL CARE Performed by: Grayce Sessions Bennett Ram Total critical care time: 45 minutes Critical care time was exclusive of separately billable procedures and treating other patients. Critical care was necessary to treat or prevent imminent or life-threatening deterioration. Critical care was time spent personally by me on the following activities: development of treatment plan  with patient and/or surrogate as well as nursing, discussions with consultants, evaluation of patient's response to treatment, examination of patient, obtaining history from patient or surrogate, ordering and performing treatments and interventions, ordering and review of laboratory studies, ordering and review of radiographic studies, pulse oximetry and re-evaluation of patient's condition.   (including critical care time)  Medical Decision Making / ED Course I have reviewed the nursing notes for this encounter and the patient's prior records (if available in EHR or on provided paperwork).    ABC intact on arrival. Taken to CT and negative for ICH.  Likely left MCA stroke with imaging.   neurology evaluate the patient and determined that he is not a good candidate for tPA.  They requested hospitalist admission.  Trop up to 0.12. EKG with new T wave inversions in the anterior septal leads.  Posterior EKG without evidence of a posterior CTA of head and neck visualized the upper Aorta and did not see evidence of dissection. Will discuss with Cardiology for recs during admission.  Renal function at baseline.  No significant electrolyte derangements.  Hemoglobin stable.  Will discuss with Medicine.   Final Clinical Impression(s) / ED Diagnoses Final diagnoses:  Acute ischemic stroke (HCC)  Elevated troponin      This chart was dictated using voice recognition software.  Despite best efforts to proofread,  errors can occur which can change the documentation meaning.   Fatima Blank, MD 10/10/17 1946

## 2017-10-10 NOTE — Progress Notes (Signed)
Patients Troponin 0.59 notified on call provider. Patient is not symptomatic cardiovascular.

## 2017-10-10 NOTE — ED Triage Notes (Signed)
Pt arrives EMS from home where he was found in the floor of the bathroom found by wife at 23. Pt is not able to speak with words and moans intermittently. Pt has no grip on right  And gaze to left. rhythmic movement of left arm.

## 2017-10-10 NOTE — ED Notes (Signed)
Condom cath placed on pt, tolerated well and family informed of procedure.

## 2017-10-10 NOTE — H&P (Signed)
History and Physical    Ronald Benitez MHD:[id number] DOB: [birth year] DOA: 10/10/2017  PCP: [name], MD Consultants:  Gwenlyn Found - cardiology; Hollie Salk - nephrology; [facility]; Tat - neurology Patient coming from:  Home - lives with wife and son; [place]: wife and children; [phone]; [phone]  Chief Complaint: fall  HPI: Ronald Benitez is a 84 y.o. male with medical history significant of DM; CAD s/p PCI; Parkinson's disease; HTN; HLD; depression; COPD; and stage 3 CKD presenting with significant neurologic deficits thought to be related to a stroke.   This morning, his wife found that he had fallen in the bathroom.  She is unsure when it happened - she found him about 715.  She left him about 1AM and he was normal in his recliner.  His walker was outside the bathroom when she found him.  No urinary or fecal incontinence.  He has a scalp bruise.  He was his normal self yesterday without difficulty.  Ambulates with his walker most of the time in the house.  No aspiration concerns normally although he does have a lot of phlegm.     ED Course:  Code stroke - awake, aphasic, trying to communicate but can't; right neglect, right hemiplegia.  Neurology has consulted, Dr. Cheral Marker.  No head bleed, not anticoagulated.  Troponin is increased to 0.12, no baseline.  EKG with new ST changes, no STEMI.   He has a page out to cardiology.  Review of Systems: As per HPI; otherwise review of systems reviewed and negative.   Ambulatory Status:  Ambulates with a walker  Past Medical History:  Diagnosis Date  . Arthritis   . BPH (benign prostatic hyperplasia)   . Bruises easily   . CAD (coronary artery disease) cardiologist-  dr berry   a. 01/16/14 s/p DES x2 to RCA. b remote RCA stenting 03/ 1997; 04/ 1997 stenting OM2;  11/ 1997  stenting pCFX, OM2 (restenosed), angioplasty OM1   . CKD (chronic kidney disease), stage III ([place])   . COPD mixed type [facility]) pulmologist-  dr byrum   mild  obstructive and restrictive  . Depression   . Diverticulosis of colon   . Dyspnea   . First degree heart block   . GERD (gastroesophageal reflux disease)   . Hearing loss of both ears   . Hiatal hernia   . History of adenomatous polyp of colon    last colonoscopy 12-06-2016 tubular adenoma w/ high grade hyperplasia (cecal polyp),  tubular adenoma not high grade dysplasia (bx),  tubulovillious adenoma's not high grade dysplasia (sigmoid polyp x2)  . History of coronary artery stent placement    03/ 1997;  04/ 1997;  11/ 1997  (unknown type); DES 01/15/17  . History of non-ST elevation myocardial infarction (NSTEMI) 03/01/2000  . HTN (hypertension)   . Hyperlipidemia   . Idiopathic Parkinson's disease [facility])    neurologist-  dr tat  . Right bundle branch block (RBBB) with left anterior fascicular block (LAFB)   . Sebaceous cyst    chronic of buttock-- per pt open area size tennis ball  . Speech impediment   . Type 2 diabetes mellitus ([place])   . Unsteady gait   . Wears glasses     Past Surgical History:  Procedure Laterality Date  . CARDIAC CATHETERIZATION  02-10-2000  dr Daneen Schick   high-grade functional total occludion of PDA, high-grade obstruction in branch of the acute margainal branch of RCA,  total occlusion of branch of the D1  with high-grade obstruction in the second branch of D1; mLAD 50%,  third obtuse marginal branch 70-80%; all of these sites of stenosis in the branch vessels are not signifinantly changed from cardiac cath. after PCI 1997,except acute marginal br   . CARDIAC CATHETERIZATION  05-10-2005  dr Tamala Julian   severe CAD w/ total occlusion D1, high-grade obstruction in acute marginal of RCA and PDA, high-grade ostruction in more superior branch of OM1 w/ wide patency of second larger branch of OM1;  continuation severe diffuse disease CFX , has very limited distribution, LAD main portion patent as is prox., mid, & distal RCA; ef 50% w/ mild anterolateral hypokinesis   .  CARDIOVASCULAR STRESS TEST  12-12-2013  dr berry   Intermediate nuclear study w/ significant reversible lateral , apical lateral defect which may represent diagonal vessel ischemia/ normal LV wall motion,  lateral hypokinesis w/ ef 48%  . CATARACT EXTRACTION W/ INTRAOCULAR LENS IMPLANT Bilateral 2009 approx.  . COLONOSCOPY WITH PROPOFOL N/A 12/06/2016   Procedure: COLONOSCOPY WITH PROPOFOL;  Surgeon: Wilford Corner, MD;  Location: [facility];  Service: Endoscopy;  Laterality: N/A;  . CORONARY ANGIOPLASTY WITH STENT PLACEMENT  08/1995  dr Daneen Schick   stenting to RCA  . CORONARY ANGIOPLASTY WITH STENT PLACEMENT  04/ 1997  dr Daneen Schick   stenting to [place] WITH STENT PLACEMENT  11/ 1997  dr Daneen Schick   stenting to proxCFX, OM2 (restenosed), PTCA to OM1  . ESOPHAGOGASTRODUODENOSCOPY ENDOSCOPY    . LEFT HEART CATHETERIZATION WITH CORONARY ANGIOGRAM N/A 01/15/2014   Procedure: LEFT HEART CATHETERIZATION WITH CORONARY ANGIOGRAM;  Surgeon: Lorretta Harp, MD;  Location: [facility] C[facility] LAB;  Service: Cardiovascular;  Laterality: N/A; occluded moderate size diagonal branch which fills by collaterals faintly;  patent mid AV groove CFX stent, patent mRCA stent w/ high-grade tandem stenosis's in the mid and distal portion  . MASS EXCISION Left 12/15/2016   Procedure: REMOVAL OF LEFT GLUTEAL SUBCUTANEOUS MASS;  Surgeon: Michael Boston, MD;  Location: [facility];  Service: General;  Laterality: Left;  . OPEN INGUINAL HERNIA REPAIR W/ MESH Right 01/ 24/ 2012   dr Dalbert Batman  . PERCUTANEOUS CORONARY ROTOBLATOR INTERVENTION (PCI-R) N/A 01/16/2014   Procedure: PERCUTANEOUS CORONARY ROTOBLATOR INTERVENTION (PCI-R);  Surgeon: Troy Sine, MD;  Location: [facility] CATH LAB;  Service: Cardiovascular;  Laterality: N/A;  PTCI to RCA and DES x2 to mid and distal RCA  . TONSILLECTOMY  child  . TRANSTHORACIC ECHOCARDIOGRAM  12-06-2013   dr berry   mild focal basal hypertrophy of the septum,  ef 50-55%,  grade 3  diastolic dysfunction/  mild AR/ mild to moderate MR/  increased thickness of the atrial septum consistant with lipomatous hypertrophy and very small PFO (agitated saline contrast study showed small right-to-left atrial level shunt/  trivial PR/  mild RAE     Social History   Socioeconomic History  . Marital status: Married    Spouse name: Not on file  . Number of children: Not on file  . Years of education: Not on file  . Highest education level: Not on file  Occupational History  . Occupation: retired    Fish farm manager: RETIRED  Social Needs  . Financial resource strain: Not on file  . Food insecurity:    Worry: Not on file    Inability: Not on file  . Transportation needs:    Medical: Not on file    Non-medical: Not on file  Tobacco Use  . Smoking  status: Former Smoker    Packs/day: 1.50    Years: 60.00    Pack years: 90.00    Types: Cigarettes, Cigars    Last attempt to quit: 05/07/2013    Years since quitting: 4.4  . Smokeless tobacco: Never Used  . Tobacco comment: quit cigarettes 1985 1.5 PPD, quit cigars 05/2013-- 5 cigars per day  Substance and Sexual Activity  . Alcohol use: No  . Drug use: No  . Sexual activity: Not on file  Lifestyle  . Physical activity:    Days per week: Not on file    Minutes per session: Not on file  . Stress: Not on file  Relationships  . Social connections:    Talks on phone: Not on file    Gets together: Not on file    Attends religious service: Not on file    Active member of club or organization: Not on file    Attends meetings of clubs or organizations: Not on file    Relationship status: Not on file  . Intimate partner violence:    Fear of current or ex partner: Not on file    Emotionally abused: Not on file    Physically abused: Not on file    Forced sexual activity: Not on file  Other Topics Concern  . Not on file  Social History Narrative  . Not on file    No Known Allergies  Family History  Problem Relation Age of  Onset  . Heart attack Mother   . Heart disease Father   . Lung cancer Sister   . COPD Sister     Prior to Admission medications   Medication Sig Start Date End Date Taking? Authorizing Provider  aspirin EC 81 MG EC tablet Take 1 tablet (81 mg total) by mouth daily. 01/17/14  Yes Eileen Stanford, PA-C  B Complex Vitamins (VITAMIN B COMPLEX PO) Take 1 tablet by mouth every evening.   Yes [provider]  captopril (CAPOTEN) 12.5 MG tablet Take 12.5 mg by mouth at bedtime.  11/05/13  Yes [provider]  carbidopa-levodopa (SINEMET IR) 25-100 MG tablet 2 in the morning, 2 in the afternoon, 1 in the evening 09/02/17  Yes Tat, Eustace Quail, DO  Cholecalciferol (VITAMIN D3) 2000 units capsule Take 2,000 Units by mouth daily.   Yes [provider]  clopidogrel (PLAVIX) 75 MG tablet Take 1 tablet (75 mg total) by mouth daily with breakfast. Contact our office for an appointment 09/16/17  Yes Lorretta Harp, MD  Coenzyme Q10 (COQ-10) 200 MG CAPS Take 200 mg by mouth daily.   Yes [provider]  Cyanocobalamin (VITAMIN B-12) 5000 MCG TBDP Take 5,000 mcg by mouth daily with supper.   Yes [provider]  finasteride (PROSCAR) 5 MG tablet Take 5 mg by mouth daily after supper.  05/25/13  Yes [provider]  glimepiride (AMARYL) 2 MG tablet Take 2 mg by mouth daily with breakfast.   Yes [provider]  Glucos-Chond-Hyal Ac-Ca Fructo ([place]) TABS Take 1 tablet by mouth 2 (two) times daily.   Yes [provider]  Homeopathic Products (ARNICARE [place] EX) Apply 1 application topically daily as needed (joint pain).   Yes [provider]  metoprolol (LOPRESSOR) 25 MG tablet Take 1 tablet (25 mg total) by mouth 2 (two) times daily. 01/17/14  Yes Eileen Stanford, PA-C  Multiple Vitamins-Minerals (ICAPS) CAPS Take 1 capsule by mouth 2 (two) times daily.  Yes [provider]  Omega-3 Fatty Acids  (FISH OIL) 1000 MG CAPS Take 1,000 mg by mouth daily.   Yes [provider]  polyethylene glycol (MIRALAX / GLYCOLAX) packet Take 17 g by mouth daily.    Yes [provider]  Sennosides-Docusate Sodium (STOOL SOFTENER & LAXATIVE PO) Take 1 tablet by mouth 2 (two) times daily.    Yes [provider]  simvastatin (ZOCOR) 80 MG tablet Take 80 mg by mouth at bedtime.    Yes [provider]  tamsulosin (FLOMAX) 0.4 MG CAPS capsule Take 0.4 mg by mouth daily with supper. 11/16/16  Yes [provider]  vitamin C (ASCORBIC ACID) 500 MG tablet Take 500 mg by mouth daily.   Yes [provider]  vitamin E 400 UNIT capsule Take 400 Units by mouth daily.   Yes [provider]  glimepiride (AMARYL) 2 MG tablet Take 4mg  by mouth every morning 09/13/16   [provider]  nitroGLYCERIN (NITROSTAT) 0.4 MG SL tablet Place 1 tablet (0.4 mg total) under the tongue every 5 (five) minutes x 3 doses as needed for chest pain. Patient not taking: Reported on 06/23/2017 01/17/14   Eileen Stanford, PA-C  traMADol (ULTRAM) 50 MG tablet Take 1-2 tablets (50-100 mg total) by mouth every 6 (six) hours as needed for moderate pain or severe pain. 12/15/16   Michael Boston, MD    Physical Exam: Vitals:   10/10/17 1045 10/10/17 1100 10/10/17 1115 10/10/17 1130  BP: (!) 147/99 (!) 154/136 (!) 164/78 (!) 174/79  Pulse: 91 95 92 (!) 101  Resp: (!) 27 (!) 25 (!) 24 (!) 27  SpO2: 91% 92% 92% 90%  Weight:      Height:         General: Appears uncomfortable with grunting and frequent uncoordinated movements Eyes:   EOMI, normal lids, iris; he may have made eye contact with me but this is not entirely clear ENT:  grossly normal hearing, lips & tongue, mmm Neck:  no LAD, masses or thyromegaly; no carotid bruits Cardiovascular:  RRR, no m/r/g. No LE edema.  Respiratory:   CTA bilaterally with no wheezes/rales/rhonchi.  Normal respiratory effort. Abdomen:  soft,  NT, ND, NABS Skin:  no rash or induration seen on limited exam Musculoskeletal:  Right hemiparesis with 1-2/5 spontaneous movement of the right leg, none of the right arm. Psychiatric: unresponsive, appears uncomfortable, did not appear to attempt any communication Neurologic: no obvious facial droop, right neglect, right hemiparesis.  Cogwheel rigidity and resting tremor of LUE appreciated.    Radiological Exams on Admission: Ct Angio Head W Or Wo Contrast  Result Date: 10/10/2017 CLINICAL DATA:  84 year old male with aphasia.  Code stroke. EXAM: CT ANGIOGRAPHY HEAD AND NECK CT PERFUSION BRAIN TECHNIQUE: Multidetector CT imaging of the head and neck was performed using the standard protocol during bolus administration of intravenous contrast. Multiplanar CT image reconstructions and MIPs were obtained to evaluate the vascular anatomy. Carotid stenosis measurements (when applicable) are obtained utilizing NASCET criteria, using the distal internal carotid diameter as the denominator. Multiphase CT imaging of the brain was performed following IV bolus contrast injection. Subsequent parametric perfusion maps were calculated using RAPID software. CONTRAST:  17mL ISOVUE-370 IOPAMIDOL (ISOVUE-370) INJECTION 76% COMPARISON:  Noncontrast head CT 0829 hours today. Cervical spine MRI 11/24/2011. FINDINGS: CT Brain Perfusion Findings: ASPECTS 10 at 0829 hours today. CBF (<30%) Volume: 19 milliliters Perfusion (Tmax>6.0s) volume: 33 milliliters Mismatch Volume: 14 milliliters Infarction Location:Middle left MCA territory/operculum  CTA NECK Skeleton: No acute osseous abnormality identified. Generalized cervical spine degeneration. Upper chest: Small volume retained secretions in the visible trachea. Respiratory motion artifact in the lung apices but evidence of emphysema in the right lung apex. Incidental azygos fissure (normal variant). Other neck: Small volume retained secretions in the hypopharynx. No neck mass or  lymphadenopathy. Aortic arch: Aberrancy origin of the right subclavian artery, normal variant. Moderate Calcified aortic atherosclerosis. Right carotid system: No proximal right CCA stenosis despite soft plaque. Calcified plaque at the right carotid bifurcation and right ICA bulb with no significant stenosis. Moderately tortuous mid cervical right ICA with calcified plaque and a kinked appearance, but otherwise no stenosis to the skull base. Left carotid system: Left CCA origin atherosclerosis without stenosis. Fairly bulky left carotid bifurcation calcified plaque tracking into the left ICA origin and bulb. Stenosis at the distal bulb up to 60 % with respect to the distal vessel (series 11, image 135). Tortuosity and mild ectasia of the left ICA distal to the bulb but no additional stenosis to the skull base. Vertebral arteries: Aberrancy origin of the right subclavian artery with calcified plaque. No significant proximal subclavian stenosis. Calcified plaque at the right vertebral artery origin appears moderate to severe with patent but asymmetrically decreased enhancement of the right vertebral artery when compared to the left throughout the neck and to the skull base. No additional cervical right vertebral artery stenosis. No significant proximal left subclavian artery stenosis despite calcified plaque. At least moderate stenosis of the left vertebral artery origin due to calcified plaque. Left vertebral artery appears dominant and demonstrates superior enhancement versus the right. No additional cervical left vertebral stenosis. CTA HEAD Posterior circulation: Dominant distal left vertebral artery with patent left PICA origin. Asymmetrically decreased enhancement of the right vertebral artery V4 segment, although the right PICA origin remains patent. The distal right V4 appears occluded proximal to the vertebrobasilar junction (series 6, image 66). Patent basilar artery without stenosis. Patent SCA origins.  Normal left PCA origin. Fetal type right PCA origin. Left posterior communicating artery is diminutive. The bilateral PCA branches appear patent with mild irregularity. Anterior circulation: Both ICA siphons are patent. There is moderate bilateral calcified plaque with tortuous cavernous segments. There is mild cavernous left ICA stenosis. There is mild to moderate stenosis of the right ICA siphon proximal cavernous and proximal supraclinoid segments. Normal ophthalmic and posterior communicating artery origins. Patent carotid termini. Patent MCA and left ACA origins. The left ACA is dominant and the right is diminutive or absent. The anterior communicating artery is ectatic. The bilateral ACA branches are patent with mild irregularity. The right MCA M1 segment, bifurcation, and right MCA branches are patent with mild irregularity. The left MCA M1 segment is patent. At the left MCA trifurcation and anterior division M2 branch is occluded as seen on series 11, image 137. Of the patent M2 branches, there are M3 branch occlusions demonstrated on series 14, image 29. Venous sinuses: Insufficient venous contrast. Anatomic variants: Dominant left vertebral artery. Dominant left ACA A1 segment. Review of the MIP images confirms the above findings IMPRESSION: 1. CTP estimates a middle Left MCA territory core infarct of 19 mL but only modest additional left MCA territory penumbra resulting in a relatively small mismatch estimated at 14 mL. 2. 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 Patent Left ICA and Left M1. 3. The above was discussed by telephone with Dr. Kerney Elbe on 10/10/2017 at 0906 hours. 4. Superimposed Left ICA calcified atherosclerosis with  estimated 60% stenosis distal Left ICA bulb. Up to moderate contralateral right ICA siphon stenosis due to calcified plaque. 5. Dominant Left vertebral artery with mild to moderate origin stenosis. Patent basilar artery without stenosis. 6. Decreased enhancement of  the Right Vertebral Artery relative to the left appears related to moderate to severe origin stenosis and occlusion of the distal right V4 segment. The right PICA origin remains patent. 7. Aortic Atherosclerosis (ICD10-I70.0) and Emphysema (ICD10-J43.9). Electronically Signed   By: Genevie Ann M.D.   On: 10/10/2017 09:30   Ct Angio Neck W Or Wo Contrast  Result Date: 10/10/2017 CLINICAL DATA:  84 year old male with aphasia.  Code stroke. EXAM: CT ANGIOGRAPHY HEAD AND NECK CT PERFUSION BRAIN TECHNIQUE: Multidetector CT imaging of the head and neck was performed using the standard protocol during bolus administration of intravenous contrast. Multiplanar CT image reconstructions and MIPs were obtained to evaluate the vascular anatomy. Carotid stenosis measurements (when applicable) are obtained utilizing NASCET criteria, using the distal internal carotid diameter as the denominator. Multiphase CT imaging of the brain was performed following IV bolus contrast injection. Subsequent parametric perfusion maps were calculated using RAPID software. CONTRAST:  12mL ISOVUE-370 IOPAMIDOL (ISOVUE-370) INJECTION 76% COMPARISON:  Noncontrast head CT 0829 hours today. Cervical spine MRI 11/24/2011. FINDINGS: CT Brain Perfusion Findings: ASPECTS 10 at 0829 hours today. CBF (<30%) Volume: 19 milliliters Perfusion (Tmax>6.0s) volume: 33 milliliters Mismatch Volume: 14 milliliters Infarction Location:Middle left MCA territory/operculum CTA NECK Skeleton: No acute osseous abnormality identified. Generalized cervical spine degeneration. Upper chest: Small volume retained secretions in the visible trachea. Respiratory motion artifact in the lung apices but evidence of emphysema in the right lung apex. Incidental azygos fissure (normal variant). Other neck: Small volume retained secretions in the hypopharynx. No neck mass or lymphadenopathy. Aortic arch: Aberrancy origin of the right subclavian artery, normal variant. Moderate Calcified  aortic atherosclerosis. Right carotid system: No proximal right CCA stenosis despite soft plaque. Calcified plaque at the right carotid bifurcation and right ICA bulb with no significant stenosis. Moderately tortuous mid cervical right ICA with calcified plaque and a kinked appearance, but otherwise no stenosis to the skull base. Left carotid system: Left CCA origin atherosclerosis without stenosis. Fairly bulky left carotid bifurcation calcified plaque tracking into the left ICA origin and bulb. Stenosis at the distal bulb up to 60 % with respect to the distal vessel (series 11, image 135). Tortuosity and mild ectasia of the left ICA distal to the bulb but no additional stenosis to the skull base. Vertebral arteries: Aberrancy origin of the right subclavian artery with calcified plaque. No significant proximal subclavian stenosis. Calcified plaque at the right vertebral artery origin appears moderate to severe with patent but asymmetrically decreased enhancement of the right vertebral artery when compared to the left throughout the neck and to the skull base. No additional cervical right vertebral artery stenosis. No significant proximal left subclavian artery stenosis despite calcified plaque. At least moderate stenosis of the left vertebral artery origin due to calcified plaque. Left vertebral artery appears dominant and demonstrates superior enhancement versus the right. No additional cervical left vertebral stenosis. CTA HEAD Posterior circulation: Dominant distal left vertebral artery with patent left PICA origin. Asymmetrically decreased enhancement of the right vertebral artery V4 segment, although the right PICA origin remains patent. The distal right V4 appears occluded proximal to the vertebrobasilar junction (series 6, image 66). Patent basilar artery without stenosis. Patent SCA origins. Normal left PCA origin. Fetal type right PCA origin. Left posterior communicating artery  is diminutive. The bilateral  PCA branches appear patent with mild irregularity. Anterior circulation: Both ICA siphons are patent. There is moderate bilateral calcified plaque with tortuous cavernous segments. There is mild cavernous left ICA stenosis. There is mild to moderate stenosis of the right ICA siphon proximal cavernous and proximal supraclinoid segments. Normal ophthalmic and posterior communicating artery origins. Patent carotid termini. Patent MCA and left ACA origins. The left ACA is dominant and the right is diminutive or absent. The anterior communicating artery is ectatic. The bilateral ACA branches are patent with mild irregularity. The right MCA M1 segment, bifurcation, and right MCA branches are patent with mild irregularity. The left MCA M1 segment is patent. At the left MCA trifurcation and anterior division M2 branch is occluded as seen on series 11, image 137. Of the patent M2 branches, there are M3 branch occlusions demonstrated on series 14, image 29. Venous sinuses: Insufficient venous contrast. Anatomic variants: Dominant left vertebral artery. Dominant left ACA A1 segment. Review of the MIP images confirms the above findings IMPRESSION: 1. CTP estimates a middle Left MCA territory core infarct of 19 mL but only modest additional left MCA territory penumbra resulting in a relatively small mismatch estimated at 14 mL. 2. 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 Patent Left ICA and Left M1. 3. The above was discussed by telephone with Dr. Kerney Elbe on 10/10/2017 at 0906 hours. 4. Superimposed Left ICA calcified atherosclerosis with estimated 60% stenosis distal Left ICA bulb. Up to moderate contralateral right ICA siphon stenosis due to calcified plaque. 5. Dominant Left vertebral artery with mild to moderate origin stenosis. Patent basilar artery without stenosis. 6. Decreased enhancement of the Right Vertebral Artery relative to the left appears related to moderate to severe origin stenosis and occlusion of  the distal right V4 segment. The right PICA origin remains patent. 7. Aortic Atherosclerosis (ICD10-I70.0) and Emphysema (ICD10-J43.9). Electronically Signed   By: Genevie Ann M.D.   On: 10/10/2017 09:30   Ct Cerebral Perfusion W Contrast  Result Date: 10/10/2017 CLINICAL DATA:  84 year old male with aphasia.  Code stroke. EXAM: CT ANGIOGRAPHY HEAD AND NECK CT PERFUSION BRAIN TECHNIQUE: Multidetector CT imaging of the head and neck was performed using the standard protocol during bolus administration of intravenous contrast. Multiplanar CT image reconstructions and MIPs were obtained to evaluate the vascular anatomy. Carotid stenosis measurements (when applicable) are obtained utilizing NASCET criteria, using the distal internal carotid diameter as the denominator. Multiphase CT imaging of the brain was performed following IV bolus contrast injection. Subsequent parametric perfusion maps were calculated using RAPID software. CONTRAST:  142mL ISOVUE-370 IOPAMIDOL (ISOVUE-370) INJECTION 76% COMPARISON:  Noncontrast head CT 0829 hours today. Cervical spine MRI 11/24/2011. FINDINGS: CT Brain Perfusion Findings: ASPECTS 10 at 0829 hours today. CBF (<30%) Volume: 19 milliliters Perfusion (Tmax>6.0s) volume: 33 milliliters Mismatch Volume: 14 milliliters Infarction Location:Middle left MCA territory/operculum CTA NECK Skeleton: No acute osseous abnormality identified. Generalized cervical spine degeneration. Upper chest: Small volume retained secretions in the visible trachea. Respiratory motion artifact in the lung apices but evidence of emphysema in the right lung apex. Incidental azygos fissure (normal variant). Other neck: Small volume retained secretions in the hypopharynx. No neck mass or lymphadenopathy. Aortic arch: Aberrancy origin of the right subclavian artery, normal variant. Moderate Calcified aortic atherosclerosis. Right carotid system: No proximal right CCA stenosis despite soft plaque. Calcified plaque at  the right carotid bifurcation and right ICA bulb with no significant stenosis. Moderately tortuous mid cervical right ICA with  calcified plaque and a kinked appearance, but otherwise no stenosis to the skull base. Left carotid system: Left CCA origin atherosclerosis without stenosis. Fairly bulky left carotid bifurcation calcified plaque tracking into the left ICA origin and bulb. Stenosis at the distal bulb up to 60 % with respect to the distal vessel (series 11, image 135). Tortuosity and mild ectasia of the left ICA distal to the bulb but no additional stenosis to the skull base. Vertebral arteries: Aberrancy origin of the right subclavian artery with calcified plaque. No significant proximal subclavian stenosis. Calcified plaque at the right vertebral artery origin appears moderate to severe with patent but asymmetrically decreased enhancement of the right vertebral artery when compared to the left throughout the neck and to the skull base. No additional cervical right vertebral artery stenosis. No significant proximal left subclavian artery stenosis despite calcified plaque. At least moderate stenosis of the left vertebral artery origin due to calcified plaque. Left vertebral artery appears dominant and demonstrates superior enhancement versus the right. No additional cervical left vertebral stenosis. CTA HEAD Posterior circulation: Dominant distal left vertebral artery with patent left PICA origin. Asymmetrically decreased enhancement of the right vertebral artery V4 segment, although the right PICA origin remains patent. The distal right V4 appears occluded proximal to the vertebrobasilar junction (series 6, image 66). Patent basilar artery without stenosis. Patent SCA origins. Normal left PCA origin. Fetal type right PCA origin. Left posterior communicating artery is diminutive. The bilateral PCA branches appear patent with mild irregularity. Anterior circulation: Both ICA siphons are patent. There is  moderate bilateral calcified plaque with tortuous cavernous segments. There is mild cavernous left ICA stenosis. There is mild to moderate stenosis of the right ICA siphon proximal cavernous and proximal supraclinoid segments. Normal ophthalmic and posterior communicating artery origins. Patent carotid termini. Patent MCA and left ACA origins. The left ACA is dominant and the right is diminutive or absent. The anterior communicating artery is ectatic. The bilateral ACA branches are patent with mild irregularity. The right MCA M1 segment, bifurcation, and right MCA branches are patent with mild irregularity. The left MCA M1 segment is patent. At the left MCA trifurcation and anterior division M2 branch is occluded as seen on series 11, image 137. Of the patent M2 branches, there are M3 branch occlusions demonstrated on series 14, image 29. Venous sinuses: Insufficient venous contrast. Anatomic variants: Dominant left vertebral artery. Dominant left ACA A1 segment. Review of the MIP images confirms the above findings IMPRESSION: 1. CTP estimates a middle Left MCA territory core infarct of 19 mL but only modest additional left MCA territory penumbra resulting in a relatively small mismatch estimated at 14 mL. 2. 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 Patent Left ICA and Left M1. 3. The above was discussed by telephone with Dr. Kerney Elbe on 10/10/2017 at 0906 hours. 4. Superimposed Left ICA calcified atherosclerosis with estimated 60% stenosis distal Left ICA bulb. Up to moderate contralateral right ICA siphon stenosis due to calcified plaque. 5. Dominant Left vertebral artery with mild to moderate origin stenosis. Patent basilar artery without stenosis. 6. Decreased enhancement of the Right Vertebral Artery relative to the left appears related to moderate to severe origin stenosis and occlusion of the distal right V4 segment. The right PICA origin remains patent. 7. Aortic Atherosclerosis (ICD10-I70.0) and  Emphysema (ICD10-J43.9). Electronically Signed   By: Genevie Ann M.D.   On: 10/10/2017 09:30   Ct Head Code Stroke Wo Contrast  Result Date: 10/10/2017 CLINICAL DATA:  Code stroke.  84 year old male with aphasia. EXAM: CT HEAD WITHOUT CONTRAST TECHNIQUE: Contiguous axial images were obtained from the base of the skull through the vertex without intravenous contrast. COMPARISON:  Cervical spine MRI 11/24/2011. FINDINGS: Brain: No acute intracranial hemorrhage identified. No midline shift, mass effect, or evidence of intracranial mass lesion. Patchy bilateral cerebral white matter hypodensity, relatively mild for age. Some involvement of the basal ganglia, more so on the left. No cortically based acute hemispheric infarct identified, but there is patchy age indeterminate hypodensity in the central right cerebellum (series 7, image 7 and coronal image 54). No cortical encephalomalacia identified. Vascular: Calcified atherosclerosis at the skull base. No suspicious intracranial vascular hyperdensity. Skull: No acute osseous abnormality identified. Sinuses/Orbits: Well pneumatized. Other: Postoperative changes to both globes. No acute orbit or scalp soft tissue finding identified. ASPECTS [facility] Stroke Program Early CT Score) - Ganglionic level infarction (caudate, lentiform nuclei, internal capsule, insula, M1-M3 cortex): 7 - Supraganglionic infarction (M4-M6 cortex): 3 Total score (0-10 with 10 being normal): 10 IMPRESSION: 1. Age indeterminate mild patchy hypodensity in the right central cerebellum. No intracranial hemorrhage or acute cortically based infarct identified. 2. ASPECTS is 10. 3. These results were communicated to Dr. Cheral Marker at 8:39 amon 5/6/2019by text page via the [facility] messaging system. Electronically Signed   By: Genevie Ann M.D.   On: 10/10/2017 08:40    EKG: Independently reviewed.  NSR with rate 87; nonspecific ST changes with TWI in V1-3 when compared to prior EKG on initial EKG and migrating into  V1-6 on repeat EKG   Labs on Admission: I have personally reviewed the available labs and imaging studies at the time of the admission.  Pertinent labs:   Glucose 154 BUN 46/Creatinine 2.62/GFR 20 - appears to be at approximate baseline since 6-12/2016 Troponin 0.12; <0.30 in 2014 Hgb 11.5; prior 11.9 on 12/15/16 INR 1.10  Assessment/Plan Principal Problem:   CVA (cerebral vascular accident) [facility]) Active Problems:   Essential hypertension   Hyperlipidemia   Chronic renal disease, stage III (HCC)   CAD (coronary artery disease)   DM (diabetes mellitus) ([place])   CVA -Potentially devastating CVA based on patient presentation at this time -CT generally negative but CTA appears to indicate MCA distribution stroke and this corresponds with his marked deficits -Will admit for CVA/TIA evaluation -Telemetry monitoring -MRI -Carotid dopplers -Echo -Risk stratification with FLP, A1c; will also check TSH  -He was on ASA and Plavix daily and so has failed DAPT. -For now, will continue ASA PO or PR starting tomorrow. -May need to consider Aggrenox but also anticoagulation given afib appreciated on EMS EKG (not seen on EKGs in the ER) -PT/OT/ST/Nutrition Consults -NPO for now -SW consult for placement -Note: I discussed at length with the family that his prognosis will likely be determined over the next few days.  Currently, he would be total care and agitated and likely would be a hospice candidate if he does not show meaningful recovery in the next few days. -Pain control for now with IV Fentanyl since it is not clear if the patient is experiencing pain and he appears to be uncomfortable.  HTN -Allow permissive HTN -Treat BP only if >220/120, and then with goal of 15% reduction -Hold lopressor and captopril and plan to restart in 48-72 hours  HLD -Check FLP -He has been on Zocor 80 mg daily; will change to Lipitor 80 mg daily if he is able to swallow it.  DM -Last A1c was  8.5. -Hold  PO medication (Amaryl) -Cover with SSI  CKD -Appears to be roughly at baseline -Will follow  CAD -Patient with TWI in lateral leads and elevated troponin. -Most likely related to acute CVA but cardiology was consulted for assistance by the ER. -He does not appear to be a candidate for further studies/treatments at this time.   DVT prophylaxis: Lovenox  Code Status:  Full - confirmed with patient/family - they plan to discuss further with additional family members Family Communication: Wife and daughter present throughout evaluation; note: it appears that the wife is not the mother of his children and this could cause potential conflict (although I didn't appreciate conflict during my evaluation) Disposition Plan: To be determined Consults called: Neurology; Cardiology; PT/OT/ST/Nutrition/SW Admission status: Admit - It is my clinical opinion that admission to INPATIENT is reasonable and necessary because of the expectation that this patient will require hospital care that crosses at least 2 midnights to treat this condition based on the medical complexity of the problems presented.  Given the aforementioned information, the predictability of an adverse outcome is felt to be significant.    Karmen Bongo MD [facility]  If note is complete, please contact covering daytime or nighttime physician. [email or web address] Password [email or web address]  10/10/2017, 12:00 PM

## 2017-10-10 NOTE — Consult Note (Addendum)
Requesting Physician: Dr. Leonette Monarch    Chief Complaint: Stroke evaluation  History obtained from:  EMS and Chart    HPI:                                                                                                                                         Ronald Benitez is an 84 y.o. male Presents to ED aphasic with right sided weakness. History of HTN, Hyperlipidemia, Parkinsons, CAD, CKD, DM2, first degree heart block. Wife found patient on floor in the bathroom about 7:45am. At Baseline he normally speaks, but when found he was aphasic. He is mobile at baseline, but no further description regarding any impairments of mobility. He was last seen normal at 1:08am.  CTA and CT ordered.  Date last known well: Date: 10/10/2017 Time last known well: Time: 01:08 tPA Given: No: Out of time window  Modified Rankin: Rankin Score=4   Past Medical History:  Diagnosis Date  . Arthritis   . BPH (benign prostatic hyperplasia)   . Bruises easily   . Bruising    pt fell approx. 3 wks ago (3 wk in june 2018) has bruising down the back  . CAD (coronary artery disease) cardiologist-  dr berry   a. 01/16/14 s/p DES x2 to RCA. b remote RCA stenting 03/ 1997; 04/ 1997 stenting OM2;  11/ 1997  stenting pCFX, OM2 (restenosed), angioplasty OM1   . CKD (chronic kidney disease), stage III ([place])   . COPD mixed type [facility]) pulmologist-  dr byrum   mild obstructive and restrictive  . Depression   . Diverticulosis of colon   . Dyspnea   . First degree heart block   . GERD (gastroesophageal reflux disease)   . Hearing loss of both ears   . Hiatal hernia   . History of adenomatous polyp of colon    last colonoscopy 12-06-2016 tubular adenoma w/ high grade hyperplasia (cecal polyp),  tubular adenoma not high grade dysplasia (bx),  tubulovillious adenoma's not high grade dysplasia (sigmoid polyp x2)  . History of coronary artery stent placement    03/ 1997;  04/ 1997;  11/ 1997  (unknown type)  . History of  non-ST elevation myocardial infarction (NSTEMI) 03/01/2000  . HTN (hypertension)   . Hyperlipidemia   . Idiopathic Parkinson's disease [facility])    neurologist-  dr tat  . Right bundle branch block (RBBB) with left anterior fascicular block (LAFB)   . S/P drug eluting coronary stent placement    01-15-2017  . Sebaceous cyst    chronic of buttock-- per pt open area size tennis ball  . Speech impediment   . Type 2 diabetes mellitus ([place])   . Unsteady gait   . Wears glasses     Past Surgical History:  Procedure Laterality Date  . CARDIAC CATHETERIZATION  02-10-2000  dr Daneen Schick   high-grade functional total occludion of  PDA, high-grade obstruction in branch of the acute margainal branch of RCA,  total occlusion of branch of the D1 with high-grade obstruction in the second branch of D1; mLAD 50%,  third obtuse marginal branch 70-80%; all of these sites of stenosis in the branch vessels are not signifinantly changed from cardiac cath. after PCI 1997,except acute marginal br   . CARDIAC CATHETERIZATION  05-10-2005  dr Tamala Julian   severe CAD w/ total occlusion D1, high-grade obstruction in acute marginal of RCA and PDA, high-grade ostruction in more superior branch of OM1 w/ wide patency of second larger branch of OM1;  continuation severe diffuse disease CFX , has very limited distribution, LAD main portion patent as is prox., mid, & distal RCA; ef 50% w/ mild anterolateral hypokinesis   . CARDIOVASCULAR STRESS TEST  12-12-2013  dr berry   Intermediate nuclear study w/ significant reversible lateral , apical lateral defect which may represent diagonal vessel ischemia/ normal LV wall motion,  lateral hypokinesis w/ ef 48%  . CATARACT EXTRACTION W/ INTRAOCULAR LENS IMPLANT Bilateral 2009 approx.  . COLONOSCOPY WITH PROPOFOL N/A 12/06/2016   Procedure: COLONOSCOPY WITH PROPOFOL;  Surgeon: Wilford Corner, MD;  Location: [facility];  Service: Endoscopy;  Laterality: N/A;  . CORONARY ANGIOPLASTY WITH  STENT PLACEMENT  08/1995  dr Daneen Schick   stenting to RCA  . CORONARY ANGIOPLASTY WITH STENT PLACEMENT  04/ 1997  dr Daneen Schick   stenting to [place] WITH STENT PLACEMENT  11/ 1997  dr Daneen Schick   stenting to proxCFX, OM2 (restenosed), PTCA to OM1  . ESOPHAGOGASTRODUODENOSCOPY ENDOSCOPY    . LEFT HEART CATHETERIZATION WITH CORONARY ANGIOGRAM N/A 01/15/2014   Procedure: LEFT HEART CATHETERIZATION WITH CORONARY ANGIOGRAM;  Surgeon: Lorretta Harp, MD;  Location: [facility] CATH LAB;  Service: Cardiovascular;  Laterality: N/A; occluded moderate size diagonal branch which fills by collaterals faintly;  patent mid AV groove CFX stent, patent mRCA stent w/ high-grade tandem stenosis's in the mid and distal portion  . MASS EXCISION Left 12/15/2016   Procedure: REMOVAL OF LEFT GLUTEAL SUBCUTANEOUS MASS;  Surgeon: Michael Boston, MD;  Location: [facility];  Service: General;  Laterality: Left;  . OPEN INGUINAL HERNIA REPAIR W/ MESH Right 01/ 24/ 2012   dr Dalbert Batman  . PERCUTANEOUS CORONARY ROTOBLATOR INTERVENTION (PCI-R) N/A 01/16/2014   Procedure: PERCUTANEOUS CORONARY ROTOBLATOR INTERVENTION (PCI-R);  Surgeon: Troy Sine, MD;  Location[facility] C[facility] LAB;  Service: Cardiovascular;  Laterality: N/A;  PTCI to RCA and DES x2 to mid and distal RCA  . TONSILLECTOMY  child  . TRANSTHORACIC ECHOCARDIOGRAM  12-06-2013   dr berry   mild focal basal hypertrophy of the septum,  ef 50-55%,  grade 3 diastolic dysfunction/  mild AR/ mild to moderate MR/  increased thickness of the atrial septum consistant with lipomatous hypertrophy and very small PFO (agitated saline contrast study showed small right-to-left atrial level shunt/  trivial PR/  mild RAE     Family History  Problem Relation Age of Onset  . Heart attack Mother   . Heart disease Father   . Lung cancer Sister   . COPD Sister          Social History:  reports that he quit smoking about 4 years ago. His smoking use included cigarettes and cigars.  He has a 90.00 pack-year smoking history. He has never used smokeless tobacco. He reports that he does not drink alcohol or use drugs.  Allergies: No Known Allergies  Medications:                                                                                                                           No current facility-administered medications for this encounter.    Current Outpatient Medications  Medication Sig Dispense Refill  . ampicillin (PRINCIPEN) 500 MG capsule TK 1 C PO BID  0  . aspirin EC 81 MG EC tablet Take 1 tablet (81 mg total) by mouth daily.    . B Complex Vitamins (VITAMIN B COMPLEX PO) Take 1 tablet by mouth every evening.    . captopril (CAPOTEN) 12.5 MG tablet Take 12.5 mg by mouth at bedtime.     . carbidopa-levodopa (SINEMET IR) 25-100 MG tablet 2 in the morning, 2 in the afternoon, 1 in the evening 450 tablet 1  . Cholecalciferol (VITAMIN D3) 2000 units capsule Take 2,000 Units by mouth daily.    . clopidogrel (PLAVIX) 75 MG tablet Take 1 tablet (75 mg total) by mouth daily with breakfast. Contact our office for an appointment 30 tablet 0  . Coenzyme Q10 (COQ-10) 200 MG CAPS Take 200 mg by mouth daily.    . Cyanocobalamin (VITAMIN B-12) 5000 MCG TBDP Take 5,000 mcg by mouth daily with supper.    . desipramine (NORPRAMIN) 50 MG tablet Take 50 mg by mouth at bedtime.    . finasteride (PROSCAR) 5 MG tablet Take 5 mg by mouth daily after supper.     Marland Kitchen glimepiride (AMARYL) 2 MG tablet Take 4mg  by mouth every morning  3  . Glucos-Chond-Hyal Ac-Ca Fructo (MOVE FREE JOINT HEALTH ADVANCE) TABS Take 1 tablet by mouth 2 (two) times daily.    . Homeopathic Products (ARNICARE ARNICA EX) Apply 1 application topically daily as needed (joint pain).    . metoprolol (LOPRESSOR) 25 MG tablet Take 1 tablet (25 mg total) by mouth 2 (two) times daily. 60 tablet 11  . Multiple Vitamins-Minerals (ICAPS) CAPS Take 1 capsule by mouth 2 (two) times daily.     . nitroGLYCERIN (NITROSTAT) 0.4 MG  SL tablet Place 1 tablet (0.4 mg total) under the tongue every 5 (five) minutes x 3 doses as needed for chest pain. (Patient not taking: Reported on 06/23/2017) 25 tablet 12  . Omega-3 Fatty Acids (FISH OIL) 1000 MG CAPS Take 1,000 mg by mouth daily.    . polyethylene glycol (MIRALAX / GLYCOLAX) packet Take 17 g by mouth daily.     [name] Sodium (STOOL SOFTENER & LAXATIVE PO) Take 1 tablet by mouth 2 (two) times daily.     . simvastatin (ZOCOR) 80 MG tablet Take 80 mg by mouth at bedtime.     . tamsulosin (FLOMAX) 0.4 MG CAPS capsule Take 0.4 mg by mouth daily with supper.  0  . traMADol (ULTRAM) 50 MG tablet Take 1-2 tablets (50-100 mg total) by mouth every 6 (six) hours as needed for moderate pain or severe pain. 20 tablet 0  .  vitamin C (ASCORBIC ACID) 500 MG tablet Take 500 mg by mouth daily.    . vitamin E 400 UNIT capsule Take 400 Units by mouth daily.      ROS:                                                                                                                                       Unable to obtain due to AMS.   General Examination:                                                                                                      HEENT: [place]/AT Lungs: Respirations unlabored Ext: Noncyanotic  Neurological Exam: Mental Status: Decreased level of alertness. Eyes open with blank stare but will track examiner's face to left and right. Does not respond to verbal commands. Does not attempt to communicate. Does not localize sternal rub but does grimace to pain.  Cranial Nerves: II:  Does not blink to threat. Pupils 2 mm and sluggishly reactive; left pupil irregular.   III,IV, VI: No ptosis. Tracks examiner horizontally. No nystagmus.  V,VII: Grimaces on left to brow ridge pressure, but not on right.  VIII: Does not respond to voice IX,X: Does not verbalize XI: Head preferentially rotated to left XII: Does not protrude tongue to command Motor/Sensory: RUE: Mild  rigidity with 2/5 movement to noxious RLE: Moderate rigidity without withdrawal to noxious; some resistance against gravity when leg is passively raised and released LUE: Cogwheel rigidity with prominent resting tremor noted. 3/5 movement to noxious. Arm remains elevated for 5 seconds after passively raised LLE: Mild rigidity without withdrawal to noxious. Some resistance against gravity when leg is passively raised and released Deep Tendon Reflexes:  Left br and biceps 1+ Left patellar trace Right br and biceps 2+ Right patellar 1+ Plantars: Mute bilaterally  Cerebellar/Gait: Not following commands    Lab Results: Basic Metabolic Panel: No results for input(s): NA, K, CL, CO2, GLUCOSE, BUN, CREATININE, CALCIUM, MG, PHOS in the last 168 hours.  CBC: No results for input(s): WBC, NEUTROABS, HGB, HCT, MCV, PLT in the last 168 hours.  Lipid Panel: No results for input(s): CHOL, TRIG, HDL, CHOLHDL, VLDL, LDLCALC in the last 168 hours.  CBG: No results for input(s): GLUCAP in the last 168 hours.  Imaging:  CTA/CTP IMPRESSION: 1. CTP estimates a middle Left MCA territory core infarct of 19 mL but only modest additional left MCA territory penumbra resulting in a relatively small mismatch estimated at  14 mL. 2. 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 Patent Left ICA and Left M1. 3. The above was discussed by telephone with Dr. Kerney Elbe on 10/10/2017 at 0906 hours. 4. Superimposed Left ICA calcified atherosclerosis with estimated 60% stenosis distal Left ICA bulb. Up to moderate contralateral right ICA siphon stenosis due to calcified plaque. 5. Dominant Left vertebral artery with mild to moderate origin stenosis. Patent basilar artery without stenosis. 6. Decreased enhancement of the Right Vertebral Artery relative to the left appears related to moderate to severe origin stenosis and occlusion of the distal right V4 segment. The right PICA origin remains  patent.  History documented by Laurey Morale, NP-C,[facility], [phone] 10/10/2017, 8:26 AM   Assessment: 84 y.o. male presenting with acute onset of right sided weakness and dense receptive and expressive aphasia 1. Examination findings best localize as a left cerebral hemisphere lesion, which is consistent with CTA findings of Left MCA M2 and M3 branch occlusions.  2. The patient is not a candidate for tPA based upon time criteria.  3. The patient is not a candidate for thrombectomy based upon mRS score of 4 4. Stroke Risk Factors - diabetes mellitus, hyperlipidemia and hypertension  5. History of Parkinson's disease. Exam findings c/w Parkinson's include left sided tremor and diffuse asymmetric rigidity. 6. Possible atrial fibrillation seen on rhythm strip from EMS. EKG in ED shows sinus versus ectopic atrial rhythm.    Recommendations: --Will need admission under hospitalist team to step down unit.  --Cardiology consult regarding possible new onset a-fib on EMS rhythm strip. EKG in ED shows sinus versus ectopic atrial rhythm. --HgbA1c, fasting lipid panel --MRI of the brain without contrast --PT consult, OT consult, Speech consult --Echocardiogram --Continue simvastatin --Continue Plavix and ASA. Consider switching to an anticoagulant after 5-7 days if not a falls risk.  --Telemetry monitoring --Frequent neuro checks --NPO until passes stroke swallow screen --Repeat CT head in 12 hours, as this may be a large left MCA stroke with potential for malignant edema --please page stroke NP  Or  PA  Or MD from 8am -4 pm  as this patient from this time will be  followed by the stroke.   You can look them up on [email or web address]  Password [email or web address]  50 minutes spent in the emergent neurological evaluation and management of this critically ill acute stroke patient  Electronically signed: Dr. Kerney Elbe

## 2017-10-10 NOTE — ED Notes (Signed)
Pt CBG, 130. RN notified.

## 2017-10-10 NOTE — CV Procedure (Signed)
Attempted 2D Echo, other staff in room with patient, will try again at a later time.  Ronald Benitez

## 2017-10-10 NOTE — ED Notes (Signed)
Dr.Cardama made aware of pt istat troponin level. ED-Lab

## 2017-10-10 NOTE — Code Documentation (Signed)
84 yo Male coming from home with complaints of fall in the bathroom this morning at 0700 along with noted right side weakness. Wife last saw patient at 25 when she left him watching TV to go to bed. She spoke with him before he went to bed, and he was at his baseline. At 0700 this morning, wife woke up to patient falling in the bathroom. Pt was noted to be unable to speak and weak on the right side. Upon arrival to the ED, pt has initial NIHSS 22 due to right facial droop, left sided gaze, global aphasia and dysarthria, right arm weakness, right and left leg weakness. Pt does not follow commands. CTA/CTP completed. No large vessel occlusion noted. No tPA due to patient being outside of window. No hx of Afib, but Afib noted on EMS EKG. Handoff given to [place], Therapist, sports. Pt to be admitted.

## 2017-10-10 NOTE — ED Notes (Signed)
Dr, Lorin Mercy at bedside.

## 2017-10-11 ENCOUNTER — Inpatient Hospital Stay (HOSPITAL_COMMUNITY): Payer: Medicare Other

## 2017-10-11 ENCOUNTER — Ambulatory Visit: Payer: Medicare Other | Admitting: Cardiovascular Disease

## 2017-10-11 DIAGNOSIS — E1122 Type 2 diabetes mellitus with diabetic chronic kidney disease: Secondary | ICD-10-CM

## 2017-10-11 DIAGNOSIS — N184 Chronic kidney disease, stage 4 (severe): Secondary | ICD-10-CM

## 2017-10-11 DIAGNOSIS — R509 Fever, unspecified: Secondary | ICD-10-CM

## 2017-10-11 DIAGNOSIS — I25110 Atherosclerotic heart disease of native coronary artery with unstable angina pectoris: Secondary | ICD-10-CM

## 2017-10-11 DIAGNOSIS — I69320 Aphasia following cerebral infarction: Secondary | ICD-10-CM

## 2017-10-11 DIAGNOSIS — I639 Cerebral infarction, unspecified: Secondary | ICD-10-CM

## 2017-10-11 DIAGNOSIS — E7800 Pure hypercholesterolemia, unspecified: Secondary | ICD-10-CM

## 2017-10-11 DIAGNOSIS — I63032 Cerebral infarction due to thrombosis of left carotid artery: Secondary | ICD-10-CM

## 2017-10-11 DIAGNOSIS — G8191 Hemiplegia, unspecified affecting right dominant side: Secondary | ICD-10-CM

## 2017-10-11 DIAGNOSIS — I69391 Dysphagia following cerebral infarction: Secondary | ICD-10-CM

## 2017-10-11 DIAGNOSIS — I63411 Cerebral infarction due to embolism of right middle cerebral artery: Secondary | ICD-10-CM

## 2017-10-11 LAB — GLUCOSE, CAPILLARY
GLUCOSE-CAPILLARY: 139 mg/dL — AB (ref 65–99)
GLUCOSE-CAPILLARY: 78 mg/dL (ref 65–99)
Glucose-Capillary: 115 mg/dL — ABNORMAL HIGH (ref 65–99)
Glucose-Capillary: 97 mg/dL (ref 65–99)

## 2017-10-11 LAB — ECHOCARDIOGRAM COMPLETE
HEIGHTINCHES: 71 in
WEIGHTICAEL: 2709.01 [oz_av]

## 2017-10-11 LAB — LIPID PANEL
Cholesterol: 120 mg/dL (ref 0–200)
HDL: 44 mg/dL (ref >40)
LDL CALC: 61 mg/dL (ref 0–99)
TRIGLYCERIDES: 76 mg/dL (ref <150)
Total CHOL/HDL Ratio: 2.7 RATIO
VLDL: 15 mg/dL (ref 0–40)

## 2017-10-11 LAB — TROPONIN I
TROPONIN I: 1.71 ng/mL — AB (ref <0.03)
TROPONIN I: 1.79 ng/mL — AB (ref <0.03)
Troponin I: 1.34 ng/mL (ref <0.03)
Troponin I: 1.97 ng/mL (ref <0.03)

## 2017-10-11 MED ORDER — DEXTROSE-NACL 5-0.45 % IV SOLN
INTRAVENOUS | Status: AC
  Administered 2017-10-11: 1000 mL via INTRAVENOUS

## 2017-10-11 NOTE — Evaluation (Signed)
Occupational Therapy Evaluation Patient Details Name: Ronald Benitez MRN: [id number] DOB: [birth year] Today's Date: 10/11/2017    History of Present Illness 84 y.o. male with medical history significant of DM; CAD s/p PCI; Parkinson's disease; HTN; HLD; depression; COPD; and stage 3 CKD presenting with significant neurologic deficits Imaging revealed Acute/early subacute infarction involving the left lateral frontal lobe and mid insula. Additional small focus of acute/early subacute infarction within the left occipitotemporal junction.   Clinical Impression   Pt admitted with above. He demonstrates the below listed deficits and will benefit from continued OT to maximize safety and independence with BADLs.   He was lethargic during eval, but aroused once moved to EOB.  He will follow intermittent commands, but unable to sustain attention to complete simple ADLs at this time, and thus requires total A. He requires mod A +2 for sit to stand.  He lives with wife at home, and was mod I with ambulation.  She assisted him with bathing and dressing.  He was able to self feed, toilet, and perform grooming mod I.  Feel he would benefit from CIR if endurance improves.  Feel the consistency of staff, and structure, plus having therapists and nsg staff who specialize in stroke rehab will be imperative for him to achieve maximal level of functioning given his history of dementia.  Family very supportive.        Follow Up Recommendations  CIR    Equipment Recommendations  None recommended by OT    Recommendations for Other Services Rehab consult     Precautions / Restrictions Precautions Precautions: Fall Restrictions Weight Bearing Restrictions: No      Mobility Bed Mobility Overal bed mobility: Needs Assistance Bed Mobility: Rolling;Sidelying to Sit;Sit to Sidelying Rolling: Total assist Sidelying to sit: Max assist;+2 for physical assistance     Sit to sidelying: Mod assist;+2 for  physical assistance General bed mobility comments: Patient mat to total assist for coming from supine to EOB, moderate assist to return to bed. Patient initiate movement to sidelying with initial elevation of LEs but required increased assist to completely elevate LEs back to bed and rotate/roll to supine. Patient then repositioned in bed +2   Transfers Overall transfer level: Needs assistance   Transfers: Sit to/from Stand Sit to Stand: +2 physical assistance;Mod assist         General transfer comment: +2 moderate assist to power up to standing, with gait belt and wrap around support    Balance Overall balance assessment: Needs assistance Sitting-balance support: Feet supported Sitting balance-Leahy Scale: Poor(pressed to fair) Sitting balance - Comments: able to sit EOB with min assist to min guard for ~8 minutes  Postural control: Posterior lean   Standing balance-Leahy Scale: Poor Standing balance comment: significant posterior and left bias in standing, 2 person moderate assist                           ADL either performed or assessed with clinical judgement   ADL Overall ADL's : Needs assistance/impaired Eating/Feeding: Total assistance;Sitting;Bed level   Grooming: Wash/dry hands;Wash/dry face;Sitting;Total assistance Grooming Details (indicate cue type and reason): Pt will bring Lt UE to face when cued to wash face, but then stops activity and unable to complete task  Upper Body Bathing: Total assistance;Sitting   Lower Body Bathing: Total assistance;Sit to/from stand   Upper Body Dressing : Total assistance;Sitting   Lower Body Dressing: Total assistance;Sit to/from stand   Toilet Transfer:  Moderate assistance;Stand-pivot;BSC   Toileting- Clothing Manipulation and Hygiene: Total assistance;Sit to/from stand       Functional mobility during ADLs: Moderate assistance;+2 for physical assistance;+2 for safety/equipment       Vision   Additional  Comments: Pt keeps eyes closed majority of time.  Lt eye matted just.  Once eye cleaned, he was able to open both eyes.  He will track Lt <> Rt, but unable to sustain attention to complete visual assessment      Perception     Praxis      Pertinent Vitals/Pain Pain Assessment: Faces Faces Pain Scale: Hurts little more Pain Location: generalized Pain Descriptors / Indicators: Moaning;Grimacing Pain Intervention(s): Monitored during session;Repositioned     Hand Dominance Right   Extremity/Trunk Assessment Upper Extremity Assessment Upper Extremity Assessment: RUE deficits/detail RUE Deficits / Details: very slight index and thumb movement noted spontaneously.  hand over hand assist provided to wash face, but pt did not attmept to use Rt UE spontaneously  RUE Coordination: decreased fine motor;decreased gross motor   Lower Extremity Assessment Lower Extremity Assessment: Defer to PT evaluation RLE Deficits / Details: RLE noted trace muscle activation, was able to withstand weight through RLE during standing.  RLE Coordination: decreased fine motor;decreased gross motor LLE Deficits / Details: generally 3/5 grossly, able to move LE on command but limited range, however was able to withstand weight on LEs. Noted area of discoloration mid calf to ankle/sock line. LLE Coordination: decreased fine motor;decreased gross motor       Communication Communication Communication: Expressive difficulties;Receptive difficulties(will need further )   Cognition Arousal/Alertness: Lethargic Behavior During Therapy: Flat affect Overall Cognitive Status: Impaired/Different from baseline Area of Impairment: Attention;Memory;Problem solving;Awareness;Safety/judgement;Following commands;Orientation                   Current Attention Level: Focused   Following Commands: Follows one step commands inconsistently     Problem Solving: Slow processing;Difficulty sequencing;Requires verbal  cues;Requires tactile cues General Comments: Patient was able to follow some commands throughout session. initiated movements upon command to stand and to lay down. Full cognitive assessment limited due to fatigue and lethargy   General Comments       Exercises     Shoulder Instructions      Home Living Family/patient expects to be discharged to:: Private residence Living Arrangements: Spouse/significant other Available Help at Discharge: Family Type of Home: House Home Access: Stairs to enter Technical brewer of Steps: 3 Entrance Stairs-Rails: None Home Layout: One level     Bathroom Shower/Tub: Occupational psychologist: Standard     Home Equipment: Environmental consultant - 2 wheels;Grab bars - toilet          Prior Functioning/Environment Level of Independence: Needs assistance  Gait / Transfers Assistance Needed: able to ambulate around the house with RW (mod I) ADL's / Homemaking Assistance Needed: assist with sponge bathing and dressing, able to feed himself            OT Problem List: Decreased strength;Decreased range of motion;Decreased activity tolerance;Impaired balance (sitting and/or standing);Decreased coordination;Decreased cognition;Decreased safety awareness;Decreased knowledge of use of DME or AE;Impaired UE functional use;Impaired vision/perception      OT Treatment/Interventions: Self-care/ADL training;DME and/or AE instruction;Manual therapy;Neuromuscular education;Therapeutic activities;Cognitive remediation/compensation;Visual/perceptual remediation/compensation;Patient/family education;Balance training    OT Goals(Current goals can be found in the care plan section) Acute Rehab OT Goals Patient Stated Goal: wife is hopeful he will return to baseline  OT Goal Formulation: With family Time For  Goal Achievement: Nov 14, 2017 Potential to Achieve Goals: Good ADL Goals Pt Will Perform Grooming: with min assist;sitting Pt Will Perform Upper Body  Bathing: with mod assist;sitting Pt Will Perform Upper Body Dressing: with mod assist;sitting Pt Will Transfer to Toilet: with min assist;ambulating;regular height toilet;bedside commode;grab bars Pt Will Perform Toileting - Clothing Manipulation and hygiene: with mod assist;sit to/from stand Additional ADL Goal #1: Pt will sustain attention to simple ADL tasks for 1 min with min cues Additional ADL Goal #2: Pt will use Rt UE as a support 75% of time during functional tasks  OT Frequency: Min 2X/week   Barriers to D/C: Decreased caregiver support  wife unable to provide current level of care        Co-evaluation PT/OT/SLP Co-Evaluation/Treatment: Yes Reason for Co-Treatment: Complexity of the patient's impairments (multi-system involvement);For patient/therapist safety;Necessary to address cognition/behavior during functional activity;To address functional/ADL transfers PT goals addressed during session: Mobility/safety with mobility OT goals addressed during session: ADL's and self-care      AM-PAC PT "6 Clicks" Daily Activity     Outcome Measure Help from another person eating meals?: Total Help from another person taking care of personal grooming?: A Lot Help from another person toileting, which includes using toliet, bedpan, or urinal?: Total Help from another person bathing (including washing, rinsing, drying)?: Total Help from another person to put on and taking off regular upper body clothing?: Total Help from another person to put on and taking off regular lower body clothing?: Total 6 Click Score: 7   End of Session Equipment Utilized During Treatment: Gait belt Nurse Communication: Mobility status  Activity Tolerance: Patient tolerated treatment well Patient left: in bed;with call bell/phone within reach;with bed alarm set;with family/visitor present  OT Visit Diagnosis: Unsteadiness on feet (R26.81);Hemiplegia and hemiparesis;Cognitive communication deficit  (R41.841) Symptoms and signs involving cognitive functions: Cerebral infarction Hemiplegia - Right/Left: Right Hemiplegia - dominant/non-dominant: Dominant Hemiplegia - caused by: Cerebral infarction                Time: 5400-8676 OT Time Calculation (min): 34 min Charges:  OT General Charges $OT Visit: 1 Visit OT Evaluation $OT Eval Moderate Complexity: 1 Mod G-Codes:     [facility], OTR/L [phone]   Lucille Passy M 10/11/2017, 12:24 PM

## 2017-10-11 NOTE — Evaluation (Signed)
Speech Language Pathology Evaluation Patient Details Name: Ronald Benitez MRN: [id number] DOB: 11-11-1928 Today's Date: 10/11/2017 Time: 1130-1140 SLP Time Calculation (min) (ACUTE ONLY): 10 min  Problem List:  Patient Active Problem List   Diagnosis Date Noted  . Acute ischemic stroke ([place])   . CVA (cerebral vascular accident) ([place]) 10/10/2017  . Elevated troponin   . Abnormal CT of the abdomen 12/06/2016  . Change in bowel habits 12/06/2016  . CN (constipation) 12/06/2016  . COPD, mild ([place]) 04/09/2014  . Unstable angina ([place]) 01/17/2014  . Mitral regurgitation   . GERD (gastroesophageal reflux disease)   . DM (diabetes mellitus) ([place])   . Asymptomatic LV dysfunction   . CAD (coronary artery disease) 01/15/2014  . Abnormal nuclear cardiac imaging test 01/14/2014  . Chronic renal disease, stage III ([place]) 01/14/2014  . Sinus bradycardia- HR 43 01/14/2014  . Essential hypertension 11/16/2013  . Hyperlipidemia 11/16/2013  . Depressive disorder, not elsewhere classified 05/08/2013   Past Medical History:  Past Medical History:  Diagnosis Date  . Arthritis   . BPH (benign prostatic hyperplasia)   . Bruises easily   . CAD (coronary artery disease) cardiologist-  dr berry   a. 01/16/14 s/p DES x2 to RCA. b remote RCA stenting 03/ 1997; 04/ 1997 stenting OM2;  11/ 1997  stenting pCFX, OM2 (restenosed), angioplasty OM1   . CKD (chronic kidney disease), stage III ([place])   . COPD mixed type [facility]) pulmologist-  dr byrum   mild obstructive and restrictive  . Depression   . Diverticulosis of colon   . Dyspnea   . First degree heart block   . GERD (gastroesophageal reflux disease)   . Hearing loss of both ears   . Hiatal hernia   . History of adenomatous polyp of colon    last colonoscopy 12-06-2016 tubular adenoma w/ high grade hyperplasia (cecal polyp),  tubular adenoma not high grade dysplasia (bx),  tubulovillious adenoma's not high grade dysplasia (sigmoid polyp x2)  .  History of coronary artery stent placement    03/ 1997;  04/ 1997;  11/ 1997  (unknown type); DES 01/15/17  . History of non-ST elevation myocardial infarction (NSTEMI) 03/01/2000  . HTN (hypertension)   . Hyperlipidemia   . Idiopathic Parkinson's disease [facility])    neurologist-  dr tat  . Right bundle branch block (RBBB) with left anterior fascicular block (LAFB)   . Sebaceous cyst    chronic of buttock-- per pt open area size tennis ball  . Speech impediment   . Type 2 diabetes mellitus ([place])   . Unsteady gait   . Wears glasses    Past Surgical History:  Past Surgical History:  Procedure Laterality Date  . CARDIAC CATHETERIZATION  02-10-2000  dr Daneen Schick   high-grade functional total occludion of PDA, high-grade obstruction in branch of the acute margainal branch of RCA,  total occlusion of branch of the D1 with high-grade obstruction in the second branch of D1; mLAD 50%,  third obtuse marginal branch 70-80%; all of these sites of stenosis in the branch vessels are not signifinantly changed from cardiac cath. after PCI 1997,except acute marginal br   . CARDIAC CATHETERIZATION  05-10-2005  dr Tamala Julian   severe CAD w/ total occlusion D1, high-grade obstruction in acute marginal of RCA and PDA, high-grade ostruction in more superior branch of OM1 w/ wide patency of second larger branch of OM1;  continuation severe diffuse disease CFX , has very limited distribution, LAD main portion patent  as is prox., mid, & distal RCA; ef 50% w/ mild anterolateral hypokinesis   . CARDIOVASCULAR STRESS TEST  12-12-2013  dr berry   Intermediate nuclear study w/ significant reversible lateral , apical lateral defect which may represent diagonal vessel ischemia/ normal LV wall motion,  lateral hypokinesis w/ ef 48%  . CATARACT EXTRACTION W/ INTRAOCULAR LENS IMPLANT Bilateral 2009 approx.  . COLONOSCOPY WITH PROPOFOL N/A 12/06/2016   Procedure: COLONOSCOPY WITH PROPOFOL;  Surgeon: Wilford Corner, MD;  Location:  [place];  Service: Endoscopy;  Laterality: N/A;  . CORONARY ANGIOPLASTY WITH STENT PLACEMENT  08/1995  dr Daneen Schick   stenting to RCA  . CORONARY ANGIOPLASTY WITH STENT PLACEMENT  04/ 1997  dr Daneen Schick   stenting to [place] WITH STENT PLACEMENT  11/ 1997  dr Daneen Schick   stenting to proxCFX, OM2 (restenosed), PTCA to OM1  . ESOPHAGOGASTRODUODENOSCOPY ENDOSCOPY    . LEFT HEART CATHETERIZATION WITH CORONARY ANGIOGRAM N/A 01/15/2014   Procedure: LEFT HEART CATHETERIZATION WITH CORONARY ANGIOGRAM;  Surgeon: Lorretta Harp, MD;  Location: [facility] CATH LAB;  Service: Cardiovascular;  Laterality: N/A; occluded moderate size diagonal branch which fills by collaterals faintly;  patent mid AV groove CFX stent, patent mRCA stent w/ high-grade tandem stenosis's in the mid and distal portion  . MASS EXCISION Left 12/15/2016   Procedure: REMOVAL OF LEFT GLUTEAL SUBCUTANEOUS MASS;  Surgeon: Michael Boston, MD;  Location: [facility];  Service: General;  Laterality: Left;  . OPEN INGUINAL HERNIA REPAIR W/ MESH Right 01/ 24/ 2012   dr Dalbert Batman  . PERCUTANEOUS CORONARY ROTOBLATOR INTERVENTION (PCI-R) N/A 01/16/2014   Procedure: PERCUTANEOUS CORONARY ROTOBLATOR INTERVENTION (PCI-R);  Surgeon: Troy Sine, MD;  Location[facility] CATH LAB;  Service: Cardiovascular;  Laterality: N/A;  PTCI to RCA and DES x2 to mid and distal RCA  . TONSILLECTOMY  child  . TRANSTHORACIC ECHOCARDIOGRAM  12-06-2013   dr berry   mild focal basal hypertrophy of the septum,  ef 50-55%,  grade 3 diastolic dysfunction/  mild AR/ mild to moderate MR/  increased thickness of the atrial septum consistant with lipomatous hypertrophy and very small PFO (agitated saline contrast study showed small right-to-left atrial level shunt/  trivial PR/  mild RAE    HPI:  Ronald Benitez a 84 y.o.malewith medical history significant ofDM; CAD s/p PCI; Parkinson's disease; HTN; HLD; depression; COPD; and stage 3 CKD presenting with  significant neurologic deficits thought to be related to a stroke.This morning, his wife found that he had fallen in the bathroom. She is unsure when it happened - she found him about 715. She left him about 1AM and he was normal in his recliner. His walker was outside the bathroom when she found him. No urinary or fecal incontinence. He has a scalp bruise. He was his normal self yesterday without difficulty. Ambulates with his walker most of the time in the house. No aspiration concerns normally although he does have a lot of phlegm. Code stroke - awake, aphasic, trying to communicate but can't; right neglect, right hemiplegia. Acute/early subacute infarction involving the left lateral frontal lobe and mid insula. Additional small focus of acute/early subacute infarction within the left occipitotemporal junction. No associated hemorrhage or mass effect.   Assessment / Plan / Recommendation Clinical Impression  Pt presents wtih severe cognitive linguistic deficits c/b inability to focus attention while alert, inability to follow simple 1 step directions related to himself and severe expressive deficits which leaves him  essentially nonverbal. Per family report, pt was functionally verbal and could perform tasks with normal cognitive range prior to admission. At this time,  pt requires skilled ST to address the above mentioned deficits, increase functional independence and further recommend CIR for intensive therapies to decrease caregiver burden. Education provided to pt's family and all questions answered to family satisfaction.      SLP Assessment  SLP Recommendation/Assessment: Patient needs continued Speech Lanaguage Pathology Services SLP Visit Diagnosis: Cognitive communication deficit (R41.841)    Follow Up Recommendations  Inpatient Rehab    Frequency and Duration min 2x/week  2 weeks      SLP Evaluation Cognition  Overall Cognitive Status: Impaired/Different from baseline(all  aspects of cognitive function impaired by lower level ) Arousal/Alertness: Awake/alert       Comprehension       Expression Written Expression Dominant Hand: Right   Oral / Motor  Oral Motor/Sensory Function Overall Oral Motor/Sensory Function: (difficult to assess d/t nonverbal/cognitive deficits)   GO                    Tiffiney Sparrow 10/11/2017, 1:52 PM

## 2017-10-11 NOTE — Progress Notes (Signed)
Patient taken for MRI

## 2017-10-11 NOTE — Progress Notes (Signed)
Rehab Admissions Coordinator Note:  Patient was screened by Cleatrice Burke for appropriateness for an Inpatient Acute Rehab Consult per PT, OT, and SLP recommendations.  At this time, we are recommending Inpatient Rehab consult.  Cleatrice Burke 10/11/2017, 2:39 PM  I can be reached at [phone].

## 2017-10-11 NOTE — Progress Notes (Addendum)
STROKE TEAM PROGRESS NOTE   INTERVAL HISTORY His family including wife are at the bedside.  He had new onset right hemiparesis and aphasia yesterday.  This morning remains mute.  Etiology of embolic stroke unclear.  Work-up underway. Family's questions and concerns addressed including current diagnosis, work-up, treatment and therapy.   Vitals:   10/10/17 2200 10/11/17 0000 10/11/17 0535 10/11/17 0758  BP: (!) 143/70 (!) 156/55 (!) 153/72 (!) 150/94  Pulse: 88 89 86 94  Resp: 15  20 20   Temp: 99.5 F (37.5 C) 99.7 F (37.6 C) 100.1 F (37.8 C) (!) 100.9 F (38.3 C)  TempSrc: Axillary Axillary Oral Axillary  SpO2: 95% 93% 99% 98%  Weight:      Height:        CBC:  CBC Latest Ref Rng & Units 10/10/2017 10/10/2017 12/15/2016  WBC 4.0 - 10.5 K/uL - 9.2 7.1  Hemoglobin 13.0 - 17.0 g/dL 12.2(L) 11.5(L) 11.9(L)  Hematocrit 39.0 - 52.0 % 36.0(L) 35.7(L) 36.0(L)  Platelets 150 - 400 K/uL - 215 295     Comprehensive Metabolic Panel:   CMP Latest Ref Rng & Units 10/10/2017 10/10/2017 12/15/2016  Glucose 65 - 99 mg/dL 154(H) 156(H) 128(H)  BUN 6 - 20 mg/dL 40(H) 46(H) 27(H)  Creatinine 0.61 - 1.24 mg/dL 2.50(H) 2.62(H) 2.11(H)  Sodium 135 - 145 mmol/L 142 139 141  Potassium 3.5 - 5.1 mmol/L 4.0 4.0 3.9  Chloride 101 - 111 mmol/L 108 106 107  CO2 22 - 32 mmol/L - 21(L) 25  Calcium 8.9 - 10.3 mg/dL - 9.4 9.2  Total Protein 6.5 - 8.1 g/dL - 7.0 -  Total Bilirubin 0.3 - 1.2 mg/dL - 1.3(H) -  Alkaline Phos 38 - 126 U/L - 73 -  AST 15 - 41 U/L - 27 -  ALT 17 - 63 U/L - 7(L) -   Lipid Panel:     Component Value Date/Time   CHOL 120 10/11/2017 0621   TRIG 76 10/11/2017 0621   HDL 44 10/11/2017 0621   CHOLHDL 2.7 10/11/2017 0621   VLDL 15 10/11/2017 0621   LDLCALC 61 10/11/2017 0621   HgbA1c:  Lab Results  Component Value Date   HGBA1C 8.1 (H) 10/10/2017   Urine Drug Screen:     Component Value Date/Time   LABOPIA NONE DETECTED 10/10/2017 0821   COCAINSCRNUR NONE DETECTED  10/10/2017 0821   LABBENZ NONE DETECTED 10/10/2017 0821   AMPHETMU NONE DETECTED 10/10/2017 0821   THCU NONE DETECTED 10/10/2017 0821   LABBARB NONE DETECTED 10/10/2017 0821    Alcohol Level     Component Value Date/Time   ETH <10 10/10/2017 1030    IMAGING Ct Angio Head W Or Wo Contrast  Result Date: 10/10/2017 CLINICAL DATA:  84 year old male with aphasia.  Code stroke. EXAM: CT ANGIOGRAPHY HEAD AND NECK CT PERFUSION BRAIN TECHNIQUE: Multidetector CT imaging of the head and neck was performed using the standard protocol during bolus administration of intravenous contrast. Multiplanar CT image reconstructions and MIPs were obtained to evaluate the vascular anatomy. Carotid stenosis measurements (when applicable) are obtained utilizing NASCET criteria, using the distal internal carotid diameter as the denominator. Multiphase CT imaging of the brain was performed following IV bolus contrast injection. Subsequent parametric perfusion maps were calculated using RAPID software. CONTRAST:  112m ISOVUE-370 IOPAMIDOL (ISOVUE-370) INJECTION 76% COMPARISON:  Noncontrast head CT 0829 hours today. Cervical spine MRI 11/24/2011. FINDINGS: CT Brain Perfusion Findings: ASPECTS 10 at 0829 hours today. CBF (<30%) Volume: 19 milliliters Perfusion (  Tmax>6.0s) volume: 33 milliliters Mismatch Volume: 14 milliliters Infarction Location:Middle left MCA territory/operculum CTA NECK Skeleton: No acute osseous abnormality identified. Generalized cervical spine degeneration. Upper chest: Small volume retained secretions in the visible trachea. Respiratory motion artifact in the lung apices but evidence of emphysema in the right lung apex. Incidental azygos fissure (normal variant). Other neck: Small volume retained secretions in the hypopharynx. No neck mass or lymphadenopathy. Aortic arch: Aberrancy origin of the right subclavian artery, normal variant. Moderate Calcified aortic atherosclerosis. Right carotid system: No  proximal right CCA stenosis despite soft plaque. Calcified plaque at the right carotid bifurcation and right ICA bulb with no significant stenosis. Moderately tortuous mid cervical right ICA with calcified plaque and a kinked appearance, but otherwise no stenosis to the skull base. Left carotid system: Left CCA origin atherosclerosis without stenosis. Fairly bulky left carotid bifurcation calcified plaque tracking into the left ICA origin and bulb. Stenosis at the distal bulb up to 60 % with respect to the distal vessel (series 11, image 135). Tortuosity and mild ectasia of the left ICA distal to the bulb but no additional stenosis to the skull base. Vertebral arteries: Aberrancy origin of the right subclavian artery with calcified plaque. No significant proximal subclavian stenosis. Calcified plaque at the right vertebral artery origin appears moderate to severe with patent but asymmetrically decreased enhancement of the right vertebral artery when compared to the left throughout the neck and to the skull base. No additional cervical right vertebral artery stenosis. No significant proximal left subclavian artery stenosis despite calcified plaque. At least moderate stenosis of the left vertebral artery origin due to calcified plaque. Left vertebral artery appears dominant and demonstrates superior enhancement versus the right. No additional cervical left vertebral stenosis. CTA HEAD Posterior circulation: Dominant distal left vertebral artery with patent left PICA origin. Asymmetrically decreased enhancement of the right vertebral artery V4 segment, although the right PICA origin remains patent. The distal right V4 appears occluded proximal to the vertebrobasilar junction (series 6, image 66). Patent basilar artery without stenosis. Patent SCA origins. Normal left PCA origin. Fetal type right PCA origin. Left posterior communicating artery is diminutive. The bilateral PCA branches appear patent with mild  irregularity. Anterior circulation: Both ICA siphons are patent. There is moderate bilateral calcified plaque with tortuous cavernous segments. There is mild cavernous left ICA stenosis. There is mild to moderate stenosis of the right ICA siphon proximal cavernous and proximal supraclinoid segments. Normal ophthalmic and posterior communicating artery origins. Patent carotid termini. Patent MCA and left ACA origins. The left ACA is dominant and the right is diminutive or absent. The anterior communicating artery is ectatic. The bilateral ACA branches are patent with mild irregularity. The right MCA M1 segment, bifurcation, and right MCA branches are patent with mild irregularity. The left MCA M1 segment is patent. At the left MCA trifurcation and anterior division M2 branch is occluded as seen on series 11, image 137. Of the patent M2 branches, there are M3 branch occlusions demonstrated on series 14, image 29. Venous sinuses: Insufficient venous contrast. Anatomic variants: Dominant left vertebral artery. Dominant left ACA A1 segment. Review of the MIP images confirms the above findings IMPRESSION: 1. CTP estimates a middle Left MCA territory core infarct of 19 mL but only modest additional left MCA territory penumbra resulting in a relatively small mismatch estimated at 14 mL. 2. 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 Patent Left ICA and Left M1. 3. The above was discussed by telephone with Dr. Randall Hiss  LINDZEN on 10/10/2017 at 0906 hours. 4. Superimposed Left ICA calcified atherosclerosis with estimated 60% stenosis distal Left ICA bulb. Up to moderate contralateral right ICA siphon stenosis due to calcified plaque. 5. Dominant Left vertebral artery with mild to moderate origin stenosis. Patent basilar artery without stenosis. 6. Decreased enhancement of the Right Vertebral Artery relative to the left appears related to moderate to severe origin stenosis and occlusion of the distal right V4 segment. The right  PICA origin remains patent. 7. Aortic Atherosclerosis (ICD10-I70.0) and Emphysema (ICD10-J43.9). Electronically Signed   By: Genevie Ann M.D.   On: 10/10/2017 09:30   Ct Angio Neck W Or Wo Contrast  Result Date: 10/10/2017 CLINICAL DATA:  84 year old male with aphasia.  Code stroke. EXAM: CT ANGIOGRAPHY HEAD AND NECK CT PERFUSION BRAIN TECHNIQUE: Multidetector CT imaging of the head and neck was performed using the standard protocol during bolus administration of intravenous contrast. Multiplanar CT image reconstructions and MIPs were obtained to evaluate the vascular anatomy. Carotid stenosis measurements (when applicable) are obtained utilizing NASCET criteria, using the distal internal carotid diameter as the denominator. Multiphase CT imaging of the brain was performed following IV bolus contrast injection. Subsequent parametric perfusion maps were calculated using RAPID software. CONTRAST:  181m ISOVUE-370 IOPAMIDOL (ISOVUE-370) INJECTION 76% COMPARISON:  Noncontrast head CT 0829 hours today. Cervical spine MRI 11/24/2011. FINDINGS: CT Brain Perfusion Findings: ASPECTS 10 at 0829 hours today. CBF (<30%) Volume: 19 milliliters Perfusion (Tmax>6.0s) volume: 33 milliliters Mismatch Volume: 14 milliliters Infarction Location:Middle left MCA territory/operculum CTA NECK Skeleton: No acute osseous abnormality identified. Generalized cervical spine degeneration. Upper chest: Small volume retained secretions in the visible trachea. Respiratory motion artifact in the lung apices but evidence of emphysema in the right lung apex. Incidental azygos fissure (normal variant). Other neck: Small volume retained secretions in the hypopharynx. No neck mass or lymphadenopathy. Aortic arch: Aberrancy origin of the right subclavian artery, normal variant. Moderate Calcified aortic atherosclerosis. Right carotid system: No proximal right CCA stenosis despite soft plaque. Calcified plaque at the right carotid bifurcation and right  ICA bulb with no significant stenosis. Moderately tortuous mid cervical right ICA with calcified plaque and a kinked appearance, but otherwise no stenosis to the skull base. Left carotid system: Left CCA origin atherosclerosis without stenosis. Fairly bulky left carotid bifurcation calcified plaque tracking into the left ICA origin and bulb. Stenosis at the distal bulb up to 60 % with respect to the distal vessel (series 11, image 135). Tortuosity and mild ectasia of the left ICA distal to the bulb but no additional stenosis to the skull base. Vertebral arteries: Aberrancy origin of the right subclavian artery with calcified plaque. No significant proximal subclavian stenosis. Calcified plaque at the right vertebral artery origin appears moderate to severe with patent but asymmetrically decreased enhancement of the right vertebral artery when compared to the left throughout the neck and to the skull base. No additional cervical right vertebral artery stenosis. No significant proximal left subclavian artery stenosis despite calcified plaque. At least moderate stenosis of the left vertebral artery origin due to calcified plaque. Left vertebral artery appears dominant and demonstrates superior enhancement versus the right. No additional cervical left vertebral stenosis. CTA HEAD Posterior circulation: Dominant distal left vertebral artery with patent left PICA origin. Asymmetrically decreased enhancement of the right vertebral artery V4 segment, although the right PICA origin remains patent. The distal right V4 appears occluded proximal to the vertebrobasilar junction (series 6, image 66). Patent basilar artery without stenosis. Patent SCA origins.  Normal left PCA origin. Fetal type right PCA origin. Left posterior communicating artery is diminutive. The bilateral PCA branches appear patent with mild irregularity. Anterior circulation: Both ICA siphons are patent. There is moderate bilateral calcified plaque with  tortuous cavernous segments. There is mild cavernous left ICA stenosis. There is mild to moderate stenosis of the right ICA siphon proximal cavernous and proximal supraclinoid segments. Normal ophthalmic and posterior communicating artery origins. Patent carotid termini. Patent MCA and left ACA origins. The left ACA is dominant and the right is diminutive or absent. The anterior communicating artery is ectatic. The bilateral ACA branches are patent with mild irregularity. The right MCA M1 segment, bifurcation, and right MCA branches are patent with mild irregularity. The left MCA M1 segment is patent. At the left MCA trifurcation and anterior division M2 branch is occluded as seen on series 11, image 137. Of the patent M2 branches, there are M3 branch occlusions demonstrated on series 14, image 29. Venous sinuses: Insufficient venous contrast. Anatomic variants: Dominant left vertebral artery. Dominant left ACA A1 segment. Review of the MIP images confirms the above findings IMPRESSION: 1. CTP estimates a middle Left MCA territory core infarct of 19 mL but only modest additional left MCA territory penumbra resulting in a relatively small mismatch estimated at 14 mL. 2. 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 Patent Left ICA and Left M1. 3. The above was discussed by telephone with Dr. Kerney Elbe on 10/10/2017 at 0906 hours. 4. Superimposed Left ICA calcified atherosclerosis with estimated 60% stenosis distal Left ICA bulb. Up to moderate contralateral right ICA siphon stenosis due to calcified plaque. 5. Dominant Left vertebral artery with mild to moderate origin stenosis. Patent basilar artery without stenosis. 6. Decreased enhancement of the Right Vertebral Artery relative to the left appears related to moderate to severe origin stenosis and occlusion of the distal right V4 segment. The right PICA origin remains patent. 7. Aortic Atherosclerosis (ICD10-I70.0) and Emphysema (ICD10-J43.9). Electronically  Signed   By: Genevie Ann M.D.   On: 10/10/2017 09:30   Mr Brain Wo Contrast  Result Date: 10/11/2017 CLINICAL DATA:  84 y/o  M; stroke for follow-up. EXAM: MRI HEAD WITHOUT CONTRAST TECHNIQUE: Multiplanar, multiecho pulse sequences of the brain and surrounding structures were obtained without intravenous contrast. COMPARISON:  10/11/2017 MRI head.  10/10/2017 CT head and CTA head. FINDINGS: Brain: Area of reduced diffusion within the left lateral frontal lobe extending into the mid insula compatible with acute/early subacute infarction. Additional small focus of acute/early subacute infarction within the left occipital temporal junction. No associated hemorrhage or mass effect. Small chronic infarcts are present within bilateral cerebellar hemispheres, left thalamus, and left anterior putamen. Moderate chronic microvascular ischemic changes and parenchymal volume loss of the brain for age. Punctate foci of susceptibility hypointensity are present in right cerebellar hemisphere and left frontal periventricular white matter compatible with hemosiderin deposition of chronic microhemorrhage. No hydrocephalus, extra-axial collection, herniation, or effacement of basilar cisterns. Vascular: Normal flow voids. Skull and upper cervical spine: Normal marrow signal. Sinuses/Orbits: Negative.  Bilateral intra-ocular lens replacement. Other: None. IMPRESSION: Acute/early subacute infarction involving the left lateral frontal lobe and mid insula. Additional small focus of acute/early subacute infarction within the left occipitotemporal junction. No associated hemorrhage or mass effect. Electronically Signed   By: Kristine Garbe M.D.   On: 10/11/2017 03:31   Ct Cerebral Perfusion W Contrast  Result Date: 10/10/2017 CLINICAL DATA:  84 year old male with aphasia.  Code stroke. EXAM: CT ANGIOGRAPHY HEAD  AND NECK CT PERFUSION BRAIN TECHNIQUE: Multidetector CT imaging of the head and neck was performed using the standard  protocol during bolus administration of intravenous contrast. Multiplanar CT image reconstructions and MIPs were obtained to evaluate the vascular anatomy. Carotid stenosis measurements (when applicable) are obtained utilizing NASCET criteria, using the distal internal carotid diameter as the denominator. Multiphase CT imaging of the brain was performed following IV bolus contrast injection. Subsequent parametric perfusion maps were calculated using RAPID software. CONTRAST:  145m ISOVUE-370 IOPAMIDOL (ISOVUE-370) INJECTION 76% COMPARISON:  Noncontrast head CT 0829 hours today. Cervical spine MRI 11/24/2011. FINDINGS: CT Brain Perfusion Findings: ASPECTS 10 at 0829 hours today. CBF (<30%) Volume: 19 milliliters Perfusion (Tmax>6.0s) volume: 33 milliliters Mismatch Volume: 14 milliliters Infarction Location:Middle left MCA territory/operculum CTA NECK Skeleton: No acute osseous abnormality identified. Generalized cervical spine degeneration. Upper chest: Small volume retained secretions in the visible trachea. Respiratory motion artifact in the lung apices but evidence of emphysema in the right lung apex. Incidental azygos fissure (normal variant). Other neck: Small volume retained secretions in the hypopharynx. No neck mass or lymphadenopathy. Aortic arch: Aberrancy origin of the right subclavian artery, normal variant. Moderate Calcified aortic atherosclerosis. Right carotid system: No proximal right CCA stenosis despite soft plaque. Calcified plaque at the right carotid bifurcation and right ICA bulb with no significant stenosis. Moderately tortuous mid cervical right ICA with calcified plaque and a kinked appearance, but otherwise no stenosis to the skull base. Left carotid system: Left CCA origin atherosclerosis without stenosis. Fairly bulky left carotid bifurcation calcified plaque tracking into the left ICA origin and bulb. Stenosis at the distal bulb up to 60 % with respect to the distal vessel (series 11,  image 135). Tortuosity and mild ectasia of the left ICA distal to the bulb but no additional stenosis to the skull base. Vertebral arteries: Aberrancy origin of the right subclavian artery with calcified plaque. No significant proximal subclavian stenosis. Calcified plaque at the right vertebral artery origin appears moderate to severe with patent but asymmetrically decreased enhancement of the right vertebral artery when compared to the left throughout the neck and to the skull base. No additional cervical right vertebral artery stenosis. No significant proximal left subclavian artery stenosis despite calcified plaque. At least moderate stenosis of the left vertebral artery origin due to calcified plaque. Left vertebral artery appears dominant and demonstrates superior enhancement versus the right. No additional cervical left vertebral stenosis. CTA HEAD Posterior circulation: Dominant distal left vertebral artery with patent left PICA origin. Asymmetrically decreased enhancement of the right vertebral artery V4 segment, although the right PICA origin remains patent. The distal right V4 appears occluded proximal to the vertebrobasilar junction (series 6, image 66). Patent basilar artery without stenosis. Patent SCA origins. Normal left PCA origin. Fetal type right PCA origin. Left posterior communicating artery is diminutive. The bilateral PCA branches appear patent with mild irregularity. Anterior circulation: Both ICA siphons are patent. There is moderate bilateral calcified plaque with tortuous cavernous segments. There is mild cavernous left ICA stenosis. There is mild to moderate stenosis of the right ICA siphon proximal cavernous and proximal supraclinoid segments. Normal ophthalmic and posterior communicating artery origins. Patent carotid termini. Patent MCA and left ACA origins. The left ACA is dominant and the right is diminutive or absent. The anterior communicating artery is ectatic. The bilateral ACA  branches are patent with mild irregularity. The right MCA M1 segment, bifurcation, and right MCA branches are patent with mild irregularity. The left MCA M1 segment is  patent. At the left MCA trifurcation and anterior division M2 branch is occluded as seen on series 11, image 137. Of the patent M2 branches, there are M3 branch occlusions demonstrated on series 14, image 29. Venous sinuses: Insufficient venous contrast. Anatomic variants: Dominant left vertebral artery. Dominant left ACA A1 segment. Review of the MIP images confirms the above findings IMPRESSION: 1. CTP estimates a middle Left MCA territory core infarct of 19 mL but only modest additional left MCA territory penumbra resulting in a relatively small mismatch estimated at 14 mL. 2. 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 Patent Left ICA and Left M1. 3. The above was discussed by telephone with Dr. Kerney Elbe on 10/10/2017 at 0906 hours. 4. Superimposed Left ICA calcified atherosclerosis with estimated 60% stenosis distal Left ICA bulb. Up to moderate contralateral right ICA siphon stenosis due to calcified plaque. 5. Dominant Left vertebral artery with mild to moderate origin stenosis. Patent basilar artery without stenosis. 6. Decreased enhancement of the Right Vertebral Artery relative to the left appears related to moderate to severe origin stenosis and occlusion of the distal right V4 segment. The right PICA origin remains patent. 7. Aortic Atherosclerosis (ICD10-I70.0) and Emphysema (ICD10-J43.9). Electronically Signed   By: Genevie Ann M.D.   On: 10/10/2017 09:30   Dg Chest Port 1 View  Result Date: 10/11/2017 CLINICAL DATA:  84 year old male with fever.  Initial encounter. EXAM: PORTABLE CHEST 1 VIEW COMPARISON:  07/23/2015 chest x-ray. FINDINGS: Pulmonary vascular congestion/pulmonary edema. Superimposed patchy consolidation left upper lobe and possibly right lung base may represent infectious process given patient's history of fever.  Heart size top-normal. Calcified aorta. IMPRESSION: Pulmonary vascular congestion/pulmonary edema. Superimposed patchy consolidation left upper lobe and possibly right lung base may represent infectious process given patient's history of fever. Follow-up until clearance recommended to exclude underlying mass. Aortic Atherosclerosis (ICD10-I70.0). Electronically Signed   By: Genia Del M.D.   On: 10/11/2017 11:15   Ct Head Code Stroke Wo Contrast  Result Date: 10/10/2017 CLINICAL DATA:  Code stroke.  84 year old male with aphasia. EXAM: CT HEAD WITHOUT CONTRAST TECHNIQUE: Contiguous axial images were obtained from the base of the skull through the vertex without intravenous contrast. COMPARISON:  Cervical spine MRI 11/24/2011. FINDINGS: Brain: No acute intracranial hemorrhage identified. No midline shift, mass effect, or evidence of intracranial mass lesion. Patchy bilateral cerebral white matter hypodensity, relatively mild for age. Some involvement of the basal ganglia, more so on the left. No cortically based acute hemispheric infarct identified, but there is patchy age indeterminate hypodensity in the central right cerebellum (series 7, image 7 and coronal image 54). No cortical encephalomalacia identified. Vascular: Calcified atherosclerosis at the skull base. No suspicious intracranial vascular hyperdensity. Skull: No acute osseous abnormality identified. Sinuses/Orbits: Well pneumatized. Other: Postoperative changes to both globes. No acute orbit or scalp soft tissue finding identified. ASPECTS [facility] Stroke Program Early CT Score) - Ganglionic level infarction (caudate, lentiform nuclei, internal capsule, insula, M1-M3 cortex): 7 - Supraganglionic infarction (M4-M6 cortex): 3 Total score (0-10 with 10 being normal): 10 IMPRESSION: 1. Age indeterminate mild patchy hypodensity in the right central cerebellum. No intracranial hemorrhage or acute cortically based infarct identified. 2. ASPECTS is 10. 3.  These results were communicated to Dr. Cheral Marker at 8:39 amon 5/6/2019by text page via the [facility] messaging system. Electronically Signed   By: Genevie Ann M.D.   On: 10/10/2017 08:40   2D echocardiogram - Left ventricle: The cavity size was normal. Wall thickness was increased in  a pattern of severe LVH. Systolic function was mildly reduced. The estimated ejection fraction was in the range of 45% to 50%. There is akinesis of the basallateral and inferior myocardium. Features are consistent with a pseudonormal left ventricular filling pattern, with concomitant abnormal relaxation and increased filling pressure (grade 2 diastolic dysfunction). - Aortic valve: There was trivial regurgitation. - Mitral valve: Calcified annulus. Impressions:   Small area of akinesis in the basal inferior and lateral walls with overall mildly reduced LV systolic function; severe LVH; moderate diastolic dysfunction; trace AI.  Lower Extremity Doppler Negative for deep and superficial vein thrombosis.   PHYSICAL EXAM HEENT: [place]/AT Lungs: Respirations mildly labored. Breath sounds cleat to auscultation.  Cardiac: HRR Ext: Noncyanotic  Neurological Exam: Mental Status: Decreased level of alertness. Eyes open with blank stare but will track examiner's face to left and right. Does not respond to verbal commands. Does not attempt to communicate. Does grimace to pain.  Cranial Nerves: II:  Does not blink to threat on the R, blinks on the L. PERRL. left pupil irregular.   III,IV, VI: No ptosis. Conservation officer, nature. No nystagmus.  V,VII: right facial weakness VIII: Does not respond to voice IX,X: Does not verbalize XI: Head preferentially rotated to left XII: Does not protrude tongue to command Motor/Sensory: RUE: Mild rigidity with 2/5 movement to noxious stimuli RLE: Moderate rigidity with withdrawal to repeated noxious stimuli. Will maintain position when propped up. LUE: Cogwheel rigidity with prominent resting tremor  noted. Withdraws briskly to noxious stimuli. Arm remains elevated after passively raised. Can move purposefully. LLE: Mild rigidity without withdrawal to noxious stimuli. Some resistance against gravity when leg is passively raised and released Plantars: Mute bilaterally  Cerebellar/Gait: deferred   ASSESSMENT/PLAN [name] is a 84 y.o. male with history of hypertension, hyperlipidemia, coronary artery disease, chronic kidney disease, diabetes, Parkinson's, first-degree heart block and known PFO found down in the bathroom presenting with right hemiparesis and global aphasia.   Stroke:   Left lateral frontal/mid insular infarct and small occipital infarct, appears embolic secondary to unknown source   Code Stroke CT head age indeterminate patchy hypodensity right central cerebellum.  ASPECTS 10.     CTA head & neck L MCA M2 and M3 branch occlusions. L ICA 60% stenosis.  LVA mild to moderate stenosis.  R VA appears moderate to severe origin stenosis.  RV for occlusion.  Aortic atherosclerosis.  Emphysema.  CT perfusion L MCA infarct of 19 mL with only 14 mL penumbra  MRI L lateral frontal and mid insular infarct.  Small left occipital infarct.  2D Echo  EF 45-50%. No source of embolus.  Grade 2 diastolic dysfunction  Known PFO   LE doppler negative   Consider loop recorder prior to d/c to look for atrial fibrillation as source of stroke (Seen by cardiology.  EKG done by EMS does not show atrial fibrillation or flutter.  No evidence of other atrial arrhythmias other than PAC.)  LDL 61  HgbA1c 8.1  Lovenox 30 mg sq daily for VTE prophylaxis  NPO. ST following. Failed bedside swallow today. To reassess tomorrow.  aspirin 81 mg daily and clopidogrel 75 mg daily prior to admission, now on aspirin 300 mg suppository daily.   Therapy recommendations:  CIR. Consult placed.  Disposition:  pending   Hypertension  Stable . Permissive hypertension (OK if < 220/120) but  gradually normalize in 5-7 days . Long-term BP goal normotensive  Hyperlipidemia  Home meds:  zocor 80, changed  to lipitor 80 in hospital  LDL 61, goal < 70  Continue statin at discharge  Diabetes type II  HgbA1c 8.1, goal < 7.0  Uncontrolled  Other Stroke Risk Factors  Advanced age  Murmur cigarette smoker, quit about 4 years ago   Coronary artery disease - MI, angioplasty and stents  Known PFO  Other Active Problems  Parkinson's disease, followed by Dr. Carles Collet  chronic kidney disease stage III creatinine 2.5  Elevated troponin likely related to acute stroke.  Seen by cardiology  Hospital day # [place], MSN, APRN, ANVP-BC, AGPCNP-BC Advanced Practice Stroke Nurse [place] for Schedule & Pager information 10/11/2017 4:01 PM   ATTENDING NOTE: I reviewed above note and agree with the assessment and plan. I have made any additions or clarifications directly to the above note. Pt was seen and examined.   84 year old male with history of CAD status post stent, DM, HTN, HLD, CAD stage III, Parkinson disease following with Dr. Carles Collet admitted for right-sided weakness, aphasia.  CT no acute abnormality.  CT head neck showed left M2/M3 branch occlusion, bilateral VA origin severe stenosis, right V4 occlusion.  MRI showed moderate left MCA infarct.  EF 45 to 50%.  DVT negative (patient known PFO).  A1c 8.1 and LDL 61.  UDS negative.   Etiology of the stroke not clear, however, highly suspicious for cardioembolic event.  Cardiology on board, may consider loop recorder if patient has reasonable recovery.  Did not pass swallow, currently n.p.o., speech follow-up tomorrow.  On suppository aspirin and IV fluid.  Will follow.  Rosalin Hawking, MD PhD Stroke Neurology 10/11/2017 6:45 PM   To contact Stroke Continuity provider, please refer to [email or web address] After hours, contact General Neurology

## 2017-10-11 NOTE — Consult Note (Signed)
Physical Medicine and Rehabilitation Consult   Reason for Consult: Stroke with functional deficits Referring Physician: Dr. Renne Crigler   HPI: Ronald Benitez is a 84 y.o. male with history of COPD, PFO,  CKD, endstage OA bilateral knees, PD with gait disorder who was admitted on 10/10/17 with fall, right sided weakness, difficulty speaking, lethargy and wife reported issues with worsening of SOB.  MRI brain showed acute/early subacute infarct affecting left frontal lobe,mid insula and additional small focus left occipital-temporal junction.  Work up ongoing and cardiology consulted at patient with elevated troponin felt to be due to demand ischemia due to stroke and to be treated medically. Patient not a candidate for anticoagulation with DOAC due to CKD.  2D echo revealed EF 45-50% with akinesis of basal lateral and inferior myocardium and severe LVH. He continues to be febrile with lethargic state. Therapy evaluations done and CIR recommended for follow up therapy.   Discussed prior functional status wit pt's wife, pt was requiring assistance with dressing , bathing for the last 2-3 months.  Slept in a chair all day and up all night.  Did not want to go see MD for pain after pilonidal cyst surgery.  Review of Systems  Unable to perform ROS: Patient unresponsive  Gastrointestinal:       Occasional problems when eating.   Musculoskeletal: Positive for falls (multiple) and joint pain (pain bilateral knees ).      Past Medical History:  Diagnosis Date  . Arthritis   . BPH (benign prostatic hyperplasia)   . Bruises easily   . CAD (coronary artery disease) cardiologist-  dr berry   a. 01/16/14 s/p DES x2 to RCA. b remote RCA stenting 03/ 1997; 04/ 1997 stenting OM2;  11/ 1997  stenting pCFX, OM2 (restenosed), angioplasty OM1   . CKD (chronic kidney disease), stage III ([place])   . COPD mixed type [facility]) pulmologist-  dr byrum   mild obstructive and restrictive  . Depression   .  Diverticulosis of colon   . Dyspnea   . First degree heart block   . GERD (gastroesophageal reflux disease)   . Hearing loss of both ears   . Hiatal hernia   . History of adenomatous polyp of colon    last colonoscopy 12-06-2016 tubular adenoma w/ high grade hyperplasia (cecal polyp),  tubular adenoma not high grade dysplasia (bx),  tubulovillious adenoma's not high grade dysplasia (sigmoid polyp x2)  . History of coronary artery stent placement    03/ 1997;  04/ 1997;  11/ 1997  (unknown type); DES 01/15/17  . History of non-ST elevation myocardial infarction (NSTEMI) 03/01/2000  . HTN (hypertension)   . Hyperlipidemia   . Idiopathic Parkinson's disease [facility])    neurologist-  dr tat  . Right bundle branch block (RBBB) with left anterior fascicular block (LAFB)   . Sebaceous cyst    chronic of buttock-- per pt open area size tennis ball  . Speech impediment   . Type 2 diabetes mellitus ([place])   . Unsteady gait   . Wears glasses     Past Surgical History:  Procedure Laterality Date  . CARDIAC CATHETERIZATION  02-10-2000  dr Daneen Schick   high-grade functional total occludion of PDA, high-grade obstruction in branch of the acute margainal branch of RCA,  total occlusion of branch of the D1 with high-grade obstruction in the second branch of D1; mLAD 50%,  third obtuse marginal branch 70-80%; all of these sites of stenosis  in the branch vessels are not signifinantly changed from cardiac cath. after PCI 1997,except acute marginal br   . CARDIAC CATHETERIZATION  05-10-2005  dr Tamala Julian   severe CAD w/ total occlusion D1, high-grade obstruction in acute marginal of RCA and PDA, high-grade ostruction in more superior branch of OM1 w/ wide patency of second larger branch of OM1;  continuation severe diffuse disease CFX , has very limited distribution, LAD main portion patent as is prox., mid, & distal RCA; ef 50% w/ mild anterolateral hypokinesis   . CARDIOVASCULAR STRESS TEST  12-12-2013  dr berry     Intermediate nuclear study w/ significant reversible lateral , apical lateral defect which may represent diagonal vessel ischemia/ normal LV wall motion,  lateral hypokinesis w/ ef 48%  . CATARACT EXTRACTION W/ INTRAOCULAR LENS IMPLANT Bilateral 2009 approx.  . COLONOSCOPY WITH PROPOFOL N/A 12/06/2016   Procedure: COLONOSCOPY WITH PROPOFOL;  Surgeon: Wilford Corner, MD;  Location: [facility];  Service: Endoscopy;  Laterality: N/A;  . CORONARY ANGIOPLASTY WITH STENT PLACEMENT  08/1995  dr Daneen Schick   stenting to RCA  . CORONARY ANGIOPLASTY WITH STENT PLACEMENT  04/ 1997  dr Daneen Schick   stenting to [place] WITH STENT PLACEMENT  11/ 1997  dr Daneen Schick   stenting to proxCFX, OM2 (restenosed), PTCA to OM1  . ESOPHAGOGASTRODUODENOSCOPY ENDOSCOPY    . LEFT HEART CATHETERIZATION WITH CORONARY ANGIOGRAM N/A 01/15/2014   Procedure: LEFT HEART CATHETERIZATION WITH CORONARY ANGIOGRAM;  Surgeon: Lorretta Harp, MD;  Location: [facility] CATH LAB;  Service: Cardiovascular;  Laterality: N/A; occluded moderate size diagonal branch which fills by collaterals faintly;  patent mid AV groove CFX stent, patent mRCA stent w/ high-grade tandem stenosis's in the mid and distal portion  . MASS EXCISION Left 12/15/2016   Procedure: REMOVAL OF LEFT GLUTEAL SUBCUTANEOUS MASS;  Surgeon: Michael Boston, MD;  Location: [facility];  Service: General;  Laterality: Left;  . OPEN INGUINAL HERNIA REPAIR W/ MESH Right 01/ 24/ 2012   dr Dalbert Batman  . PERCUTANEOUS CORONARY ROTOBLATOR INTERVENTION (PCI-R) N/A 01/16/2014   Procedure: PERCUTANEOUS CORONARY ROTOBLATOR INTERVENTION (PCI-R);  Surgeon: Troy Sine, MD;  Location: [facility] CATH LAB;  Service: Cardiovascular;  Laterality: N/A;  PTCI to RCA and DES x2 to mid and distal RCA  . TONSILLECTOMY  child  . TRANSTHORACIC ECHOCARDIOGRAM  12-06-2013   dr berry   mild focal basal hypertrophy of the septum,  ef 50-55%,  grade 3 diastolic dysfunction/  mild AR/ mild to moderate  MR/  increased thickness of the atrial septum consistant with lipomatous hypertrophy and very small PFO (agitated saline contrast study showed small right-to-left atrial level shunt/  trivial PR/  mild RAE     Family History  Problem Relation Age of Onset  . Heart attack Mother   . Heart disease Father   . Lung cancer Sister   . COPD Sister     Social History:  Married--retired accountand. Sedentary--sits most of the day and homebound. Wife helps to sponge bathe. He occasionally uses walker. Son at home helps out  Per  reports that he quit smoking about 4 years ago. His smoking use included cigarettes and cigars. He has a 90.00 pack-year smoking history. He has never used smokeless tobacco. Per reports that he does not drink alcohol or use drugs.    Allergies: No Known Allergies    Medications Prior to Admission  Medication Sig Dispense Refill  . aspirin EC 81 MG EC tablet  Take 1 tablet (81 mg total) by mouth daily.    . B Complex Vitamins (VITAMIN B COMPLEX PO) Take 1 tablet by mouth every evening.    . captopril (CAPOTEN) 12.5 MG tablet Take 12.5 mg by mouth at bedtime.     . carbidopa-levodopa (SINEMET IR) 25-100 MG tablet 2 in the morning, 2 in the afternoon, 1 in the evening 450 tablet 1  . Cholecalciferol (VITAMIN D3) 2000 units capsule Take 2,000 Units by mouth daily.    . clopidogrel (PLAVIX) 75 MG tablet Take 1 tablet (75 mg total) by mouth daily with breakfast. Contact our office for an appointment 30 tablet 0  . Coenzyme Q10 (COQ-10) 200 MG CAPS Take 200 mg by mouth daily.    . Cyanocobalamin (VITAMIN B-12) 5000 MCG TBDP Take 5,000 mcg by mouth daily with supper.    . finasteride (PROSCAR) 5 MG tablet Take 5 mg by mouth daily after supper.     Marland Kitchen glimepiride (AMARYL) 2 MG tablet Take 2 mg by mouth daily with breakfast.    . Glucos-Chond-Hyal Ac-Ca Fructo (MOVE FREE JOINT HEALTH ADVANCE) TABS Take 1 tablet by mouth 2 (two) times daily.    . Homeopathic Products (ARNICARE ARNICA  EX) Apply 1 application topically daily as needed (joint pain).    . metoprolol (LOPRESSOR) 25 MG tablet Take 1 tablet (25 mg total) by mouth 2 (two) times daily. 60 tablet 11  . Multiple Vitamins-Minerals (ICAPS) CAPS Take 1 capsule by mouth 2 (two) times daily.     . Omega-3 Fatty Acids (FISH OIL) 1000 MG CAPS Take 1,000 mg by mouth daily.    . polyethylene glycol (MIRALAX / GLYCOLAX) packet Take 17 g by mouth daily.     [name] Sodium (STOOL SOFTENER & LAXATIVE PO) Take 1 tablet by mouth 2 (two) times daily.     . simvastatin (ZOCOR) 80 MG tablet Take 80 mg by mouth at bedtime.     . tamsulosin (FLOMAX) 0.4 MG CAPS capsule Take 0.4 mg by mouth daily with supper.  0  . vitamin C (ASCORBIC ACID) 500 MG tablet Take 500 mg by mouth daily.    . vitamin E 400 UNIT capsule Take 400 Units by mouth daily.    . nitroGLYCERIN (NITROSTAT) 0.4 MG SL tablet Place 1 tablet (0.4 mg total) under the tongue every 5 (five) minutes x 3 doses as needed for chest pain. (Patient not taking: Reported on 06/23/2017) 25 tablet 12  . traMADol (ULTRAM) 50 MG tablet Take 1-2 tablets (50-100 mg total) by mouth every 6 (six) hours as needed for moderate pain or severe pain. 20 tablet 0    Home: Home Living Family/patient expects to be discharged to:: Private residence Living Arrangements: Spouse/significant other Available Help at Discharge: Family Type of Home: House Home Access: Stairs to enter Technical brewer of Steps: 3 Entrance Stairs-Rails: None Home Layout: One level Bathroom Shower/Tub: Multimedia programmer: [place]: Environmental consultant - 2 wheels, Grab bars - toilet  Lives With: Spouse  Functional History: Prior Function Level of Independence: Needs assistance Gait / Transfers Assistance Needed: able to ambulate around the house with RW (mod I) ADL's / Homemaking Assistance Needed: assist with sponge bathing and dressing, able to feed himself Functional Status:    Mobility: Bed Mobility Overal bed mobility: Needs Assistance Bed Mobility: Rolling, Sidelying to Sit, Sit to Sidelying Rolling: Total assist Sidelying to sit: Max assist, +2 for physical assistance Sit to sidelying: Mod assist, +2 for physical  assistance General bed mobility comments: Patient mat to total assist for coming from supine to EOB, moderate assist to return to bed. Patient initiate movement to sidelying with initial elevation of LEs but required increased assist to completely elevate LEs back to bed and rotate/roll to supine. Patient then repositioned in bed +2  Transfers Overall transfer level: Needs assistance Transfer via Lift Equipment: (gait belt and face to face) Transfers: Sit to/from Stand Sit to Stand: +2 physical assistance, Mod assist General transfer comment: +2 moderate assist to power up to standing, with gait belt and wrap around support Ambulation/Gait General Gait Details: not performed    ADL: ADL Overall ADL's : Needs assistance/impaired Eating/Feeding: Total assistance, Sitting, Bed level Grooming: Wash/dry hands, Wash/dry face, Sitting, Total assistance Grooming Details (indicate cue type and reason): Pt will bring Lt UE to face when cued to wash face, but then stops activity and unable to complete task  Upper Body Bathing: Total assistance, Sitting Lower Body Bathing: Total assistance, Sit to/from stand Upper Body Dressing : Total assistance, Sitting Lower Body Dressing: Total assistance, Sit to/from stand Toilet Transfer: Moderate assistance, Stand-pivot, BSC Toileting- Clothing Manipulation and Hygiene: Total assistance, Sit to/from stand Functional mobility during ADLs: Moderate assistance, +2 for physical assistance, +2 for safety/equipment  Cognition: Cognition Overall Cognitive Status: Impaired/Different from baseline(all aspects of cognitive function impaired by lower level ) Arousal/Alertness: Awake/alert Orientation Level: Other  (comment) Cognition Arousal/Alertness: Lethargic Behavior During Therapy: Flat affect Overall Cognitive Status: Impaired/Different from baseline(all aspects of cognitive function impaired by lower level ) Area of Impairment: Attention, Memory, Problem solving, Awareness, Safety/judgement, Following commands, Orientation Current Attention Level: Focused Following Commands: Follows one step commands inconsistently Problem Solving: Slow processing, Difficulty sequencing, Requires verbal cues, Requires tactile cues General Comments: Patient was able to follow some commands throughout session. initiated movements upon command to stand and to lay down. Full cognitive assessment limited due to fatigue and lethargy   Blood pressure 135/63, pulse 80, temperature 100 F (37.8 C), temperature source Axillary, resp. rate 20, height 5\' 11"  (1.803 m), weight 76.8 kg (169 lb 5 oz), SpO2 98 %. Physical Exam  Nursing note and vitals reviewed. Constitutional: He appears well-developed. He appears lethargic. He appears cachectic. He is uncooperative. He has a sickly appearance. No distress.  responds minimally to pain  Musculoskeletal:  Dry skin with stasis changes left shin--chronic changes at baseline per family.   Neurological: He appears lethargic.  Lethargic and responds to pain. Non verbal except for occasional moans.   Skin: He is not diaphoretic.  Motor trace biceps on the right no finger flexion and ext.  2- Right HF, KE trace toe flex/ext Weak Withdrawal to pain RUE No withdrawal to pain RLE or LLE Speech aphonic, does follow most 1 step commnads with the exception of mouth opening an dtongue protrusion  Results for orders placed or performed during the hospital encounter of 10/10/17 (from the past 24 hour(s))  Glucose, capillary     Status: Abnormal   Collection Time: 10/10/17  5:51 PM  Result Value Ref Range   Glucose-Capillary 132 (H) 65 - 99 mg/dL  Troponin I     Status: Abnormal    Collection Time: 10/10/17  6:04 PM  Result Value Ref Range   Troponin I 0.59 (HH) <0.03 ng/mL  Glucose, capillary     Status: Abnormal   Collection Time: 10/10/17 10:10 PM  Result Value Ref Range   Glucose-Capillary 119 (H) 65 - 99 mg/dL  Troponin I  Status: Abnormal   Collection Time: 10/11/17 12:19 AM  Result Value Ref Range   Troponin I 1.34 (HH) <0.03 ng/mL  Troponin I (q 6hr x 3)     Status: Abnormal   Collection Time: 10/11/17  6:21 AM  Result Value Ref Range   Troponin I 1.71 (HH) <0.03 ng/mL  Lipid panel     Status: None   Collection Time: 10/11/17  6:21 AM  Result Value Ref Range   Cholesterol 120 0 - 200 mg/dL   Triglycerides 76 <150 mg/dL   HDL 44 >40 mg/dL   Total CHOL/HDL Ratio 2.7 RATIO   VLDL 15 0 - 40 mg/dL   LDL Cholesterol 61 0 - 99 mg/dL  Glucose, capillary     Status: Abnormal   Collection Time: 10/11/17  6:27 AM  Result Value Ref Range   Glucose-Capillary 139 (H) 65 - 99 mg/dL   Comment 1 Notify RN    Comment 2 Document in Chart   Glucose, capillary     Status: Abnormal   Collection Time: 10/11/17 11:52 AM  Result Value Ref Range   Glucose-Capillary 115 (H) 65 - 99 mg/dL   Comment 1 Notify RN    Comment 2 Document in Chart   Troponin I (q 6hr x 3)     Status: Abnormal   Collection Time: 10/11/17 12:14 PM  Result Value Ref Range   Troponin I 1.97 (HH) <0.03 ng/mL   Ct Angio Head W Or Wo Contrast  Result Date: 10/10/2017 CLINICAL DATA:  84 year old male with aphasia.  Code stroke. EXAM: CT ANGIOGRAPHY HEAD AND NECK CT PERFUSION BRAIN TECHNIQUE: Multidetector CT imaging of the head and neck was performed using the standard protocol during bolus administration of intravenous contrast. Multiplanar CT image reconstructions and MIPs were obtained to evaluate the vascular anatomy. Carotid stenosis measurements (when applicable) are obtained utilizing NASCET criteria, using the distal internal carotid diameter as the denominator. Multiphase CT imaging of the  brain was performed following IV bolus contrast injection. Subsequent parametric perfusion maps were calculated using RAPID software. CONTRAST:  174mL ISOVUE-370 IOPAMIDOL (ISOVUE-370) INJECTION 76% COMPARISON:  Noncontrast head CT 0829 hours today. Cervical spine MRI 11/24/2011. FINDINGS: CT Brain Perfusion Findings: ASPECTS 10 at 0829 hours today. CBF (<30%) Volume: 19 milliliters Perfusion (Tmax>6.0s) volume: 33 milliliters Mismatch Volume: 14 milliliters Infarction Location:Middle left MCA territory/operculum CTA NECK Skeleton: No acute osseous abnormality identified. Generalized cervical spine degeneration. Upper chest: Small volume retained secretions in the visible trachea. Respiratory motion artifact in the lung apices but evidence of emphysema in the right lung apex. Incidental azygos fissure (normal variant). Other neck: Small volume retained secretions in the hypopharynx. No neck mass or lymphadenopathy. Aortic arch: Aberrancy origin of the right subclavian artery, normal variant. Moderate Calcified aortic atherosclerosis. Right carotid system: No proximal right CCA stenosis despite soft plaque. Calcified plaque at the right carotid bifurcation and right ICA bulb with no significant stenosis. Moderately tortuous mid cervical right ICA with calcified plaque and a kinked appearance, but otherwise no stenosis to the skull base. Left carotid system: Left CCA origin atherosclerosis without stenosis. Fairly bulky left carotid bifurcation calcified plaque tracking into the left ICA origin and bulb. Stenosis at the distal bulb up to 60 % with respect to the distal vessel (series 11, image 135). Tortuosity and mild ectasia of the left ICA distal to the bulb but no additional stenosis to the skull base. Vertebral arteries: Aberrancy origin of the right subclavian artery with calcified plaque. No significant  proximal subclavian stenosis. Calcified plaque at the right vertebral artery origin appears moderate to  severe with patent but asymmetrically decreased enhancement of the right vertebral artery when compared to the left throughout the neck and to the skull base. No additional cervical right vertebral artery stenosis. No significant proximal left subclavian artery stenosis despite calcified plaque. At least moderate stenosis of the left vertebral artery origin due to calcified plaque. Left vertebral artery appears dominant and demonstrates superior enhancement versus the right. No additional cervical left vertebral stenosis. CTA HEAD Posterior circulation: Dominant distal left vertebral artery with patent left PICA origin. Asymmetrically decreased enhancement of the right vertebral artery V4 segment, although the right PICA origin remains patent. The distal right V4 appears occluded proximal to the vertebrobasilar junction (series 6, image 66). Patent basilar artery without stenosis. Patent SCA origins. Normal left PCA origin. Fetal type right PCA origin. Left posterior communicating artery is diminutive. The bilateral PCA branches appear patent with mild irregularity. Anterior circulation: Both ICA siphons are patent. There is moderate bilateral calcified plaque with tortuous cavernous segments. There is mild cavernous left ICA stenosis. There is mild to moderate stenosis of the right ICA siphon proximal cavernous and proximal supraclinoid segments. Normal ophthalmic and posterior communicating artery origins. Patent carotid termini. Patent MCA and left ACA origins. The left ACA is dominant and the right is diminutive or absent. The anterior communicating artery is ectatic. The bilateral ACA branches are patent with mild irregularity. The right MCA M1 segment, bifurcation, and right MCA branches are patent with mild irregularity. The left MCA M1 segment is patent. At the left MCA trifurcation and anterior division M2 branch is occluded as seen on series 11, image 137. Of the patent M2 branches, there are M3 branch  occlusions demonstrated on series 14, image 29. Venous sinuses: Insufficient venous contrast. Anatomic variants: Dominant left vertebral artery. Dominant left ACA A1 segment. Review of the MIP images confirms the above findings IMPRESSION: 1. CTP estimates a middle Left MCA territory core infarct of 19 mL but only modest additional left MCA territory penumbra resulting in a relatively small mismatch estimated at 14 mL. 2. 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 Patent Left ICA and Left M1. 3. The above was discussed by telephone with Dr. Kerney Elbe on 10/10/2017 at 0906 hours. 4. Superimposed Left ICA calcified atherosclerosis with estimated 60% stenosis distal Left ICA bulb. Up to moderate contralateral right ICA siphon stenosis due to calcified plaque. 5. Dominant Left vertebral artery with mild to moderate origin stenosis. Patent basilar artery without stenosis. 6. Decreased enhancement of the Right Vertebral Artery relative to the left appears related to moderate to severe origin stenosis and occlusion of the distal right V4 segment. The right PICA origin remains patent. 7. Aortic Atherosclerosis (ICD10-I70.0) and Emphysema (ICD10-J43.9). Electronically Signed   By: Genevie Ann M.D.   On: 10/10/2017 09:30   Ct Angio Neck W Or Wo Contrast  Result Date: 10/10/2017 CLINICAL DATA:  84 year old male with aphasia.  Code stroke. EXAM: CT ANGIOGRAPHY HEAD AND NECK CT PERFUSION BRAIN TECHNIQUE: Multidetector CT imaging of the head and neck was performed using the standard protocol during bolus administration of intravenous contrast. Multiplanar CT image reconstructions and MIPs were obtained to evaluate the vascular anatomy. Carotid stenosis measurements (when applicable) are obtained utilizing NASCET criteria, using the distal internal carotid diameter as the denominator. Multiphase CT imaging of the brain was performed following IV bolus contrast injection. Subsequent parametric perfusion maps were calculated  using  RAPID software. CONTRAST:  129mL ISOVUE-370 IOPAMIDOL (ISOVUE-370) INJECTION 76% COMPARISON:  Noncontrast head CT 0829 hours today. Cervical spine MRI 11/24/2011. FINDINGS: CT Brain Perfusion Findings: ASPECTS 10 at 0829 hours today. CBF (<30%) Volume: 19 milliliters Perfusion (Tmax>6.0s) volume: 33 milliliters Mismatch Volume: 14 milliliters Infarction Location:Middle left MCA territory/operculum CTA NECK Skeleton: No acute osseous abnormality identified. Generalized cervical spine degeneration. Upper chest: Small volume retained secretions in the visible trachea. Respiratory motion artifact in the lung apices but evidence of emphysema in the right lung apex. Incidental azygos fissure (normal variant). Other neck: Small volume retained secretions in the hypopharynx. No neck mass or lymphadenopathy. Aortic arch: Aberrancy origin of the right subclavian artery, normal variant. Moderate Calcified aortic atherosclerosis. Right carotid system: No proximal right CCA stenosis despite soft plaque. Calcified plaque at the right carotid bifurcation and right ICA bulb with no significant stenosis. Moderately tortuous mid cervical right ICA with calcified plaque and a kinked appearance, but otherwise no stenosis to the skull base. Left carotid system: Left CCA origin atherosclerosis without stenosis. Fairly bulky left carotid bifurcation calcified plaque tracking into the left ICA origin and bulb. Stenosis at the distal bulb up to 60 % with respect to the distal vessel (series 11, image 135). Tortuosity and mild ectasia of the left ICA distal to the bulb but no additional stenosis to the skull base. Vertebral arteries: Aberrancy origin of the right subclavian artery with calcified plaque. No significant proximal subclavian stenosis. Calcified plaque at the right vertebral artery origin appears moderate to severe with patent but asymmetrically decreased enhancement of the right vertebral artery when compared to the left  throughout the neck and to the skull base. No additional cervical right vertebral artery stenosis. No significant proximal left subclavian artery stenosis despite calcified plaque. At least moderate stenosis of the left vertebral artery origin due to calcified plaque. Left vertebral artery appears dominant and demonstrates superior enhancement versus the right. No additional cervical left vertebral stenosis. CTA HEAD Posterior circulation: Dominant distal left vertebral artery with patent left PICA origin. Asymmetrically decreased enhancement of the right vertebral artery V4 segment, although the right PICA origin remains patent. The distal right V4 appears occluded proximal to the vertebrobasilar junction (series 6, image 66). Patent basilar artery without stenosis. Patent SCA origins. Normal left PCA origin. Fetal type right PCA origin. Left posterior communicating artery is diminutive. The bilateral PCA branches appear patent with mild irregularity. Anterior circulation: Both ICA siphons are patent. There is moderate bilateral calcified plaque with tortuous cavernous segments. There is mild cavernous left ICA stenosis. There is mild to moderate stenosis of the right ICA siphon proximal cavernous and proximal supraclinoid segments. Normal ophthalmic and posterior communicating artery origins. Patent carotid termini. Patent MCA and left ACA origins. The left ACA is dominant and the right is diminutive or absent. The anterior communicating artery is ectatic. The bilateral ACA branches are patent with mild irregularity. The right MCA M1 segment, bifurcation, and right MCA branches are patent with mild irregularity. The left MCA M1 segment is patent. At the left MCA trifurcation and anterior division M2 branch is occluded as seen on series 11, image 137. Of the patent M2 branches, there are M3 branch occlusions demonstrated on series 14, image 29. Venous sinuses: Insufficient venous contrast. Anatomic variants:  Dominant left vertebral artery. Dominant left ACA A1 segment. Review of the MIP images confirms the above findings IMPRESSION: 1. CTP estimates a middle Left MCA territory core infarct of 19 mL but only modest additional  left MCA territory penumbra resulting in a relatively small mismatch estimated at 14 mL. 2. 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 Patent Left ICA and Left M1. 3. The above was discussed by telephone with Dr. Kerney Elbe on 10/10/2017 at 0906 hours. 4. Superimposed Left ICA calcified atherosclerosis with estimated 60% stenosis distal Left ICA bulb. Up to moderate contralateral right ICA siphon stenosis due to calcified plaque. 5. Dominant Left vertebral artery with mild to moderate origin stenosis. Patent basilar artery without stenosis. 6. Decreased enhancement of the Right Vertebral Artery relative to the left appears related to moderate to severe origin stenosis and occlusion of the distal right V4 segment. The right PICA origin remains patent. 7. Aortic Atherosclerosis (ICD10-I70.0) and Emphysema (ICD10-J43.9). Electronically Signed   By: Genevie Ann M.D.   On: 10/10/2017 09:30   Mr Brain Wo Contrast  Result Date: 10/11/2017 CLINICAL DATA:  84 y/o  M; stroke for follow-up. EXAM: MRI HEAD WITHOUT CONTRAST TECHNIQUE: Multiplanar, multiecho pulse sequences of the brain and surrounding structures were obtained without intravenous contrast. COMPARISON:  10/11/2017 MRI head.  10/10/2017 CT head and CTA head. FINDINGS: Brain: Area of reduced diffusion within the left lateral frontal lobe extending into the mid insula compatible with acute/early subacute infarction. Additional small focus of acute/early subacute infarction within the left occipital temporal junction. No associated hemorrhage or mass effect. Small chronic infarcts are present within bilateral cerebellar hemispheres, left thalamus, and left anterior putamen. Moderate chronic microvascular ischemic changes and parenchymal volume  loss of the brain for age. Punctate foci of susceptibility hypointensity are present in right cerebellar hemisphere and left frontal periventricular white matter compatible with hemosiderin deposition of chronic microhemorrhage. No hydrocephalus, extra-axial collection, herniation, or effacement of basilar cisterns. Vascular: Normal flow voids. Skull and upper cervical spine: Normal marrow signal. Sinuses/Orbits: Negative.  Bilateral intra-ocular lens replacement. Other: None. IMPRESSION: Acute/early subacute infarction involving the left lateral frontal lobe and mid insula. Additional small focus of acute/early subacute infarction within the left occipitotemporal junction. No associated hemorrhage or mass effect. Electronically Signed   By: Kristine Garbe M.D.   On: 10/11/2017 03:31   Ct Cerebral Perfusion W Contrast  Result Date: 10/10/2017 CLINICAL DATA:  84 year old male with aphasia.  Code stroke. EXAM: CT ANGIOGRAPHY HEAD AND NECK CT PERFUSION BRAIN TECHNIQUE: Multidetector CT imaging of the head and neck was performed using the standard protocol during bolus administration of intravenous contrast. Multiplanar CT image reconstructions and MIPs were obtained to evaluate the vascular anatomy. Carotid stenosis measurements (when applicable) are obtained utilizing NASCET criteria, using the distal internal carotid diameter as the denominator. Multiphase CT imaging of the brain was performed following IV bolus contrast injection. Subsequent parametric perfusion maps were calculated using RAPID software. CONTRAST:  176mL ISOVUE-370 IOPAMIDOL (ISOVUE-370) INJECTION 76% COMPARISON:  Noncontrast head CT 0829 hours today. Cervical spine MRI 11/24/2011. FINDINGS: CT Brain Perfusion Findings: ASPECTS 10 at 0829 hours today. CBF (<30%) Volume: 19 milliliters Perfusion (Tmax>6.0s) volume: 33 milliliters Mismatch Volume: 14 milliliters Infarction Location:Middle left MCA territory/operculum CTA NECK Skeleton: No  acute osseous abnormality identified. Generalized cervical spine degeneration. Upper chest: Small volume retained secretions in the visible trachea. Respiratory motion artifact in the lung apices but evidence of emphysema in the right lung apex. Incidental azygos fissure (normal variant). Other neck: Small volume retained secretions in the hypopharynx. No neck mass or lymphadenopathy. Aortic arch: Aberrancy origin of the right subclavian artery, normal variant. Moderate Calcified aortic atherosclerosis. Right carotid system: No proximal right  CCA stenosis despite soft plaque. Calcified plaque at the right carotid bifurcation and right ICA bulb with no significant stenosis. Moderately tortuous mid cervical right ICA with calcified plaque and a kinked appearance, but otherwise no stenosis to the skull base. Left carotid system: Left CCA origin atherosclerosis without stenosis. Fairly bulky left carotid bifurcation calcified plaque tracking into the left ICA origin and bulb. Stenosis at the distal bulb up to 60 % with respect to the distal vessel (series 11, image 135). Tortuosity and mild ectasia of the left ICA distal to the bulb but no additional stenosis to the skull base. Vertebral arteries: Aberrancy origin of the right subclavian artery with calcified plaque. No significant proximal subclavian stenosis. Calcified plaque at the right vertebral artery origin appears moderate to severe with patent but asymmetrically decreased enhancement of the right vertebral artery when compared to the left throughout the neck and to the skull base. No additional cervical right vertebral artery stenosis. No significant proximal left subclavian artery stenosis despite calcified plaque. At least moderate stenosis of the left vertebral artery origin due to calcified plaque. Left vertebral artery appears dominant and demonstrates superior enhancement versus the right. No additional cervical left vertebral stenosis. CTA HEAD Posterior  circulation: Dominant distal left vertebral artery with patent left PICA origin. Asymmetrically decreased enhancement of the right vertebral artery V4 segment, although the right PICA origin remains patent. The distal right V4 appears occluded proximal to the vertebrobasilar junction (series 6, image 66). Patent basilar artery without stenosis. Patent SCA origins. Normal left PCA origin. Fetal type right PCA origin. Left posterior communicating artery is diminutive. The bilateral PCA branches appear patent with mild irregularity. Anterior circulation: Both ICA siphons are patent. There is moderate bilateral calcified plaque with tortuous cavernous segments. There is mild cavernous left ICA stenosis. There is mild to moderate stenosis of the right ICA siphon proximal cavernous and proximal supraclinoid segments. Normal ophthalmic and posterior communicating artery origins. Patent carotid termini. Patent MCA and left ACA origins. The left ACA is dominant and the right is diminutive or absent. The anterior communicating artery is ectatic. The bilateral ACA branches are patent with mild irregularity. The right MCA M1 segment, bifurcation, and right MCA branches are patent with mild irregularity. The left MCA M1 segment is patent. At the left MCA trifurcation and anterior division M2 branch is occluded as seen on series 11, image 137. Of the patent M2 branches, there are M3 branch occlusions demonstrated on series 14, image 29. Venous sinuses: Insufficient venous contrast. Anatomic variants: Dominant left vertebral artery. Dominant left ACA A1 segment. Review of the MIP images confirms the above findings IMPRESSION: 1. CTP estimates a middle Left MCA territory core infarct of 19 mL but only modest additional left MCA territory penumbra resulting in a relatively small mismatch estimated at 14 mL. 2. 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 Patent Left ICA and Left M1. 3. The above was discussed by telephone with  Dr. Kerney Elbe on 10/10/2017 at 0906 hours. 4. Superimposed Left ICA calcified atherosclerosis with estimated 60% stenosis distal Left ICA bulb. Up to moderate contralateral right ICA siphon stenosis due to calcified plaque. 5. Dominant Left vertebral artery with mild to moderate origin stenosis. Patent basilar artery without stenosis. 6. Decreased enhancement of the Right Vertebral Artery relative to the left appears related to moderate to severe origin stenosis and occlusion of the distal right V4 segment. The right PICA origin remains patent. 7. Aortic Atherosclerosis (ICD10-I70.0) and Emphysema (ICD10-J43.9). Electronically Signed  By: Genevie Ann M.D.   On: 10/10/2017 09:30   Dg Chest Port 1 View  Result Date: 10/11/2017 CLINICAL DATA:  84 year old male with fever.  Initial encounter. EXAM: PORTABLE CHEST 1 VIEW COMPARISON:  07/23/2015 chest x-ray. FINDINGS: Pulmonary vascular congestion/pulmonary edema. Superimposed patchy consolidation left upper lobe and possibly right lung base may represent infectious process given patient's history of fever. Heart size top-normal. Calcified aorta. IMPRESSION: Pulmonary vascular congestion/pulmonary edema. Superimposed patchy consolidation left upper lobe and possibly right lung base may represent infectious process given patient's history of fever. Follow-up until clearance recommended to exclude underlying mass. Aortic Atherosclerosis (ICD10-I70.0). Electronically Signed   By: Genia Del M.D.   On: 10/11/2017 11:15   Ct Head Code Stroke Wo Contrast  Result Date: 10/10/2017 CLINICAL DATA:  Code stroke.  84 year old male with aphasia. EXAM: CT HEAD WITHOUT CONTRAST TECHNIQUE: Contiguous axial images were obtained from the base of the skull through the vertex without intravenous contrast. COMPARISON:  Cervical spine MRI 11/24/2011. FINDINGS: Brain: No acute intracranial hemorrhage identified. No midline shift, mass effect, or evidence of intracranial mass lesion.  Patchy bilateral cerebral white matter hypodensity, relatively mild for age. Some involvement of the basal ganglia, more so on the left. No cortically based acute hemispheric infarct identified, but there is patchy age indeterminate hypodensity in the central right cerebellum (series 7, image 7 and coronal image 54). No cortical encephalomalacia identified. Vascular: Calcified atherosclerosis at the skull base. No suspicious intracranial vascular hyperdensity. Skull: No acute osseous abnormality identified. Sinuses/Orbits: Well pneumatized. Other: Postoperative changes to both globes. No acute orbit or scalp soft tissue finding identified. ASPECTS [facility] Stroke Program Early CT Score) - Ganglionic level infarction (caudate, lentiform nuclei, internal capsule, insula, M1-M3 cortex): 7 - Supraganglionic infarction (M4-M6 cortex): 3 Total score (0-10 with 10 being normal): 10 IMPRESSION: 1. Age indeterminate mild patchy hypodensity in the right central cerebellum. No intracranial hemorrhage or acute cortically based infarct identified. 2. ASPECTS is 10. 3. These results were communicated to Dr. Cheral Marker at 8:39 amon 5/6/2019by text page via the [facility] messaging system. Electronically Signed   By: Genevie Ann M.D.   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 Does the need for close, 24 hr/day medical supervision in concert with the patient's rehab needs make it unreasonable for this patient to be served in a less intensive setting? Potentially 2. Co-Morbidities requiring supervision/potential complications: Parkinson's disease 3. Due to bladder management, bowel management, safety, skin/wound care, disease management, medication administration, pain management and patient education, does the patient require 24 hr/day rehab nursing? Potentially 4. Does the patient require  coordinated care of a physician, rehab nurse, PT, OT, SLP to address physical and functional deficits in the context of the above medical diagnosis(es)? Yes Addressing deficits in the following areas: balance, endurance, locomotion, strength, transferring, bowel/bladder control, bathing, dressing, feeding, grooming, toileting, cognition, speech, language, swallowing and psychosocial support 5. Can the patient actively participate in an intensive therapy program of at least 3 hrs of therapy per day at least 5 days per week? No 6. The potential for patient to make measurable gains while on inpatient rehab is poor 7. Anticipated functional outcomes upon discharge from inpatient rehab are n/a  with PT, n/a with OT, n/a with SLP. 8. Estimated rehab length of stay to reach the above functional goals is: NA 9. Anticipated D/C setting: SNF 10. Anticipated  post D/C treatments: HH therapy 11. Overall Rehab/Functional Prognosis: poor  RECOMMENDATIONS: This patient's condition is appropriate for continued rehabilitative care in the following setting: SNF Patient has agreed to participate in recommended program. N/A Note that insurance prior authorization may be required for reimbursement for recommended care.  Comment: Will monitor PT, OT progress , if endurance and ability to participate improves will re eval.  Pt was suffering decline in fxn with need for 24hr assist  2-3 mo prior to admission  Charlett Blake M.D. [facility] FAAPM&R (Sports Med, Neuromuscular Med) Diplomate Am Board of [place], PA-C 10/11/2017

## 2017-10-11 NOTE — Evaluation (Signed)
Physical Therapy Evaluation Patient Details Name: Ronald Benitez MRN: [id number] DOB: [birth year] Today's Date: 10/11/2017   History of Present Illness  84 y.o. male with medical history significant of DM; CAD s/p PCI; Parkinson's disease; HTN; HLD; depression; COPD; and stage 3 CKD presenting with significant neurologic deficits Imaging revealed Acute/early subacute infarction involving the left lateral frontal lobe and mid insula. Additional small focus of acute/early subacute infarction within the left occipitotemporal junction.  Clinical Impression  Orders received for PT evaluation. Patient demonstrates deficits in functional mobility as indicated below. Will benefit from continued skilled PT to address deficits and maximize function. Will see as indicated and progress as tolerated.  Prior to admission, patient was ambulating with mod I using RW around the house, wife provided some assist for bathing and dressing. Currently, patient requiring +2 moderate to max assist for aspects of mobility with some notable limitations due to lethargy. Feel patient will need post acute rehabilitation to address functional impairments. Will recommend CIR consult.     Follow Up Recommendations CIR;Supervision/Assistance - 24 hour    Equipment Recommendations  (tbd)    Recommendations for Other Services Rehab consult     Precautions / Restrictions Precautions Precautions: Fall Restrictions Weight Bearing Restrictions: No      Mobility  Bed Mobility Overal bed mobility: Needs Assistance Bed Mobility: Rolling;Sidelying to Sit;Sit to Sidelying Rolling: Total assist Sidelying to sit: Max assist;+2 for physical assistance     Sit to sidelying: Mod assist;+2 for physical assistance General bed mobility comments: Patient mat to total assist for coming from supine to EOB, moderate assist to return to bed. Patient initiate movement to sidelying with initial elevation of LEs but required increased  assist to completely elevate LEs back to bed and rotate/roll to supine. Patient then repositioned in bed +2   Transfers Overall transfer level: Needs assistance   Transfers: Sit to/from Stand Sit to Stand: +2 physical assistance;Mod assist         General transfer comment: +2 moderate assist to power up to standing, with gait belt and wrap around support  Ambulation/Gait             General Gait Details: not performed  Stairs            Wheelchair Mobility    Modified Rankin (Stroke Patients Only) Modified Rankin (Stroke Patients Only) Pre-Morbid Rankin Score: Moderate disability Modified Rankin: Severe disability     Balance Overall balance assessment: Needs assistance Sitting-balance support: Feet supported Sitting balance-Leahy Scale: Poor(pressed to fair) Sitting balance - Comments: able to sit EOB with min assist to min guard for ~8 minutes  Postural control: Posterior lean   Standing balance-Leahy Scale: Poor Standing balance comment: significant posterior and left bias in standing, 2 person moderate assist                             Pertinent Vitals/Pain Pain Assessment: Faces Faces Pain Scale: Hurts little more Pain Location: generalized Pain Descriptors / Indicators: Moaning;Grimacing Pain Intervention(s): Monitored during session;Repositioned    Home Living Family/patient expects to be discharged to:: Private residence Living Arrangements: Spouse/significant other Available Help at Discharge: Family Type of Home: House Home Access: Stairs to enter Entrance Stairs-Rails: None Entrance Stairs-Number of Steps: 3 Home Layout: One level Home Equipment: Walker - 2 wheels;Grab bars - toilet      Prior Function Level of Independence: Needs assistance   Gait / Transfers Assistance Needed: able to  ambulate around the house with RW (mod I)  ADL's / Homemaking Assistance Needed: assist with sponge bathing and dressing, able to feed  himself        Hand Dominance   Dominant Hand: Right    Extremity/Trunk Assessment   Upper Extremity Assessment Upper Extremity Assessment: Defer to OT evaluation    Lower Extremity Assessment Lower Extremity Assessment: RLE deficits/detail;LLE deficits/detail;Difficult to assess due to impaired cognition RLE Deficits / Details: RLE noted trace muscle activation, was able to withstand weight through RLE during standing.  RLE Coordination: decreased fine motor;decreased gross motor LLE Deficits / Details: generally 3/5 grossly, able to move LE on command but limited range, however was able to withstand weight on LEs. Noted area of discoloration mid calf to ankle/sock line. LLE Coordination: decreased fine motor;decreased gross motor       Communication   Communication: Expressive difficulties  Cognition Arousal/Alertness: Lethargic Behavior During Therapy: Flat affect Overall Cognitive Status: Impaired/Different from baseline Area of Impairment: Attention;Memory;Problem solving;Awareness;Safety/judgement;Following commands;Orientation                   Current Attention Level: Focused   Following Commands: Follows one step commands inconsistently     Problem Solving: Slow processing;Difficulty sequencing;Requires verbal cues;Requires tactile cues General Comments: Patient was able to follow some commands throughout session. initiated movements upon command to stand and to lay down. Full cognitive assessment limited due to fatigue and lethargy      General Comments      Exercises     Assessment/Plan    PT Assessment Patient needs continued PT services  PT Problem List Decreased strength;Decreased activity tolerance;Decreased balance;Decreased mobility;Decreased cognition;Cardiopulmonary status limiting activity       PT Treatment Interventions DME instruction;Gait training;Stair training;Functional mobility training;Therapeutic activities;Therapeutic  exercise;Balance training;Neuromuscular re-education;Patient/family education    PT Goals (Current goals can be found in the Care Plan section)  Acute Rehab PT Goals Patient Stated Goal: to get better PT Goal Formulation: With family Time For Goal Achievement: 11/12/17 Potential to Achieve Goals: Good    Frequency Min 3X/week   Barriers to discharge        Co-evaluation PT/OT/SLP Co-Evaluation/Treatment: Yes Reason for Co-Treatment: Complexity of the patient's impairments (multi-system involvement);Necessary to address cognition/behavior during functional activity;For patient/therapist safety;To address functional/ADL transfers PT goals addressed during session: Mobility/safety with mobility OT goals addressed during session: ADL's and self-care;Strengthening/ROM       AM-PAC PT "6 Clicks" Daily Activity  Outcome Measure Difficulty turning over in bed (including adjusting bedclothes, sheets and blankets)?: Unable Difficulty moving from lying on back to sitting on the side of the bed? : Unable Difficulty sitting down on and standing up from a chair with arms (e.g., wheelchair, bedside commode, etc,.)?: Unable Help needed moving to and from a bed to chair (including a wheelchair)?: A Lot Help needed walking in hospital room?: A Lot Help needed climbing 3-5 steps with a railing? : Total 6 Click Score: 8    End of Session Equipment Utilized During Treatment: Gait belt;Oxygen Activity Tolerance: Patient limited by fatigue Patient left: in bed(with transport ) Nurse Communication: Mobility status PT Visit Diagnosis: Muscle weakness (generalized) (M62.81);Difficulty in walking, not elsewhere classified (R26.2);Other symptoms and signs involving the nervous system (R29.898)    Time: 2993-7169 PT Time Calculation (min) (ACUTE ONLY): 33 min   Charges:   PT Evaluation $PT Eval Moderate Complexity: 1 Mod     PT G Codes:        Alben Deeds, PT  DPT  Board Certified Neurologic  Specialist [place] 10/11/2017, 10:34 AM

## 2017-10-11 NOTE — Progress Notes (Signed)
PROGRESS NOTE  Ronald Benitez XIP:[id number] DOB: 08-10-1928 DOA: 10/10/2017 PCP: [name], MD   LOS: 1 day   Brief Narrative / Interim history: 84 yo M with good prior functional status, history of DM, CAD s/p PCI, Parkinson's ds, HTN, HLD, CKD who was brought to the hospital on 5/6 after being found down in the bathroom by his wife around 7 am. He was noted to have right sided hemiplegia and neglect. Neurology was consulted in the ED. Cardiology was consulted as well for positive troponin and ST segment changes on EKG. No reports of chest discomfort prior to this episode.   Assessment & Plan: Principal Problem:   CVA (cerebral vascular accident) ([place]) Active Problems:   Essential hypertension   Hyperlipidemia   Chronic renal disease, stage III (HCC)   CAD (coronary artery disease)   DM (diabetes mellitus) ([place])   Elevated troponin  Acute CVA -MRI on admission showed acute/early subacute infarction involving the left lateral frontal lobe and mid insula. Additional small focus of acute/early subacute infarction within the left occipitotemporal junction. No associated hemorrhage or mass effect. -neurology consulted, appreciate input -still poorly responsive this morning, opening his eyes but non verbal, difficult to predict potential for recovery, high risk, closely monitor neurologic status today  -2D echo pending -LDL 61, A1C 8.1 -Aspirin pr  Fever -fever this morning, UA negative, will obtain a CXR. No leukocytosis  Elevated troponin -cardiology consulted, appreciate input. Troponin continues to climb, 1.71 currently.  -not a candidate for cath currently given #1, cannot anticoagulate for now due to #1 as well   DM -poorly controlled, with renal complications, on SSI. Currently NPO  CKD IV -Cr 2.1 - 2.78 in 2018, currently appears at baseline when compared to last year  HTN -NPO, allow permissive HTN  HLD -on atorvastatin  Parkinson's disease -NPO,  hold off home meds, has resting tremor on left but not on right.   DVT prophylaxis: Lovenox Code Status: Full code Family Communication: wife present at bedside Disposition Plan: TBD  Consultants:   Neurology  Cardiology   Procedures:   2D echo: pending  Antimicrobials:  None    Subjective: -poorly responsive, opens his eyes, non verbal, not following commands  Objective: Vitals:   10/10/17 2200 10/11/17 0000 10/11/17 0535 10/11/17 0758  BP: (!) 143/70 (!) 156/55 (!) 153/72 (!) 150/94  Pulse: 88 89 86 94  Resp: 15  20 20   Temp: 99.5 F (37.5 C) 99.7 F (37.6 C) 100.1 F (37.8 C) (!) 100.9 F (38.3 C)  TempSrc: Axillary Axillary Oral Axillary  SpO2: 95% 93% 99% 98%  Weight:      Height:        Intake/Output Summary (Last 24 hours) at 10/11/2017 0917 Last data filed at 10/11/2017 0543 Gross per 24 hour  Intake 615 ml  Output 650 ml  Net -35 ml   Filed Weights   10/10/17 0906  Weight: 76.8 kg (169 lb 5 oz)    Examination:  Constitutional: No apparent distress Eyes: PERRL, lids and conjunctivae normal Respiratory: clear to auscultation bilaterally, no wheezing, no crackles. Normal respiratory effort. No accessory muscle use.  Cardiovascular: Regular rate and rhythm, no murmurs / rubs / gallops. No LE edema. 2+ pedal pulses. No carotid bruits.  Abdomen: no tenderness. Bowel sounds positive.  Skin: no rashes Neurologic: parkinson tremor on left, flaccid right arm. appears to have had 1-2 spontaneous movements right leg.  Psychiatric: non verbal   Data Reviewed: I have independently reviewed  following labs and imaging studies   MRI - left L frontal lobe infarct on DWI, left occipitotemporal junction CVA  CBC: Recent Labs  Lab 10/10/17 0821 10/10/17 0834  WBC 9.2  --   NEUTROABS 6.9  --   HGB 11.5* 12.2*  HCT 35.7* 36.0*  MCV 95.2  --   PLT 215  --    Basic Metabolic Panel: Recent Labs  Lab 10/10/17 0821 10/10/17 0834  NA 139 142  K 4.0 4.0    CL 106 108  CO2 21*  --   GLUCOSE 156* 154*  BUN 46* 40*  CREATININE 2.62* 2.50*  CALCIUM 9.4  --    GFR: Estimated Creatinine Clearance: 22.2 mL/min (A) (by C-G formula based on SCr of 2.5 mg/dL (H)). Liver Function Tests: Recent Labs  Lab 10/10/17 0821  AST 27  ALT 7*  ALKPHOS 73  BILITOT 1.3*  PROT 7.0  ALBUMIN 3.5   No results for input(s): LIPASE, AMYLASE in the last 168 hours. No results for input(s): AMMONIA in the last 168 hours. Coagulation Profile: Recent Labs  Lab 10/10/17 0821  INR 1.10   Cardiac Enzymes: Recent Labs  Lab 10/10/17 1315 10/10/17 1804 10/11/17 0019 10/11/17 0621  TROPONINI 0.18* 0.59* 1.34* 1.71*   BNP (last 3 results) No results for input(s): PROBNP in the last 8760 hours. HbA1C: Recent Labs    10/10/17 1315  HGBA1C 8.1*   CBG: Recent Labs  Lab 10/10/17 0859 10/10/17 1751 10/10/17 2210 10/11/17 0627  GLUCAP 130* 132* 119* 139*   Lipid Profile: Recent Labs    10/11/17 0621  CHOL 120  HDL 44  LDLCALC 61  TRIG 76  CHOLHDL 2.7   Thyroid Function Tests: Recent Labs    10/10/17 1315  TSH 3.338   Anemia Panel: No results for input(s): VITAMINB12, FOLATE, FERRITIN, TIBC, IRON, RETICCTPCT in the last 72 hours. Urine analysis:    Component Value Date/Time   COLORURINE YELLOW 10/10/2017 0821   APPEARANCEUR HAZY (A) 10/10/2017 0821   LABSPEC 1.023 10/10/2017 0821   PHURINE 5.0 10/10/2017 0821   GLUCOSEU NEGATIVE 10/10/2017 0821   HGBUR NEGATIVE 10/10/2017 0821   BILIRUBINUR NEGATIVE 10/10/2017 0821   KETONESUR 5 (A) 10/10/2017 0821   PROTEINUR 100 (A) 10/10/2017 0821   UROBILINOGEN 0.2 05/27/2013 1231   NITRITE NEGATIVE 10/10/2017 0821   LEUKOCYTESUR NEGATIVE 10/10/2017 0821   Sepsis Labs: Invalid input(s): PROCALCITONIN, LACTICIDVEN  No results found for this or any previous visit (from the past 240 hour(s)).    Radiology Studies: Ct Angio Head W Or Wo Contrast  Result Date: 10/10/2017 CLINICAL DATA:   84 year old male with aphasia.  Code stroke. EXAM: CT ANGIOGRAPHY HEAD AND NECK CT PERFUSION BRAIN TECHNIQUE: Multidetector CT imaging of the head and neck was performed using the standard protocol during bolus administration of intravenous contrast. Multiplanar CT image reconstructions and MIPs were obtained to evaluate the vascular anatomy. Carotid stenosis measurements (when applicable) are obtained utilizing NASCET criteria, using the distal internal carotid diameter as the denominator. Multiphase CT imaging of the brain was performed following IV bolus contrast injection. Subsequent parametric perfusion maps were calculated using RAPID software. CONTRAST:  163mL ISOVUE-370 IOPAMIDOL (ISOVUE-370) INJECTION 76% COMPARISON:  Noncontrast head CT 0829 hours today. Cervical spine MRI 11/24/2011. FINDINGS: CT Brain Perfusion Findings: ASPECTS 10 at 0829 hours today. CBF (<30%) Volume: 19 milliliters Perfusion (Tmax>6.0s) volume: 33 milliliters Mismatch Volume: 14 milliliters Infarction Location:Middle left MCA territory/operculum CTA NECK Skeleton: No acute osseous abnormality identified. Generalized cervical  spine degeneration. Upper chest: Small volume retained secretions in the visible trachea. Respiratory motion artifact in the lung apices but evidence of emphysema in the right lung apex. Incidental azygos fissure (normal variant). Other neck: Small volume retained secretions in the hypopharynx. No neck mass or lymphadenopathy. Aortic arch: Aberrancy origin of the right subclavian artery, normal variant. Moderate Calcified aortic atherosclerosis. Right carotid system: No proximal right CCA stenosis despite soft plaque. Calcified plaque at the right carotid bifurcation and right ICA bulb with no significant stenosis. Moderately tortuous mid cervical right ICA with calcified plaque and a kinked appearance, but otherwise no stenosis to the skull base. Left carotid system: Left CCA origin atherosclerosis without  stenosis. Fairly bulky left carotid bifurcation calcified plaque tracking into the left ICA origin and bulb. Stenosis at the distal bulb up to 60 % with respect to the distal vessel (series 11, image 135). Tortuosity and mild ectasia of the left ICA distal to the bulb but no additional stenosis to the skull base. Vertebral arteries: Aberrancy origin of the right subclavian artery with calcified plaque. No significant proximal subclavian stenosis. Calcified plaque at the right vertebral artery origin appears moderate to severe with patent but asymmetrically decreased enhancement of the right vertebral artery when compared to the left throughout the neck and to the skull base. No additional cervical right vertebral artery stenosis. No significant proximal left subclavian artery stenosis despite calcified plaque. At least moderate stenosis of the left vertebral artery origin due to calcified plaque. Left vertebral artery appears dominant and demonstrates superior enhancement versus the right. No additional cervical left vertebral stenosis. CTA HEAD Posterior circulation: Dominant distal left vertebral artery with patent left PICA origin. Asymmetrically decreased enhancement of the right vertebral artery V4 segment, although the right PICA origin remains patent. The distal right V4 appears occluded proximal to the vertebrobasilar junction (series 6, image 66). Patent basilar artery without stenosis. Patent SCA origins. Normal left PCA origin. Fetal type right PCA origin. Left posterior communicating artery is diminutive. The bilateral PCA branches appear patent with mild irregularity. Anterior circulation: Both ICA siphons are patent. There is moderate bilateral calcified plaque with tortuous cavernous segments. There is mild cavernous left ICA stenosis. There is mild to moderate stenosis of the right ICA siphon proximal cavernous and proximal supraclinoid segments. Normal ophthalmic and posterior communicating artery  origins. Patent carotid termini. Patent MCA and left ACA origins. The left ACA is dominant and the right is diminutive or absent. The anterior communicating artery is ectatic. The bilateral ACA branches are patent with mild irregularity. The right MCA M1 segment, bifurcation, and right MCA branches are patent with mild irregularity. The left MCA M1 segment is patent. At the left MCA trifurcation and anterior division M2 branch is occluded as seen on series 11, image 137. Of the patent M2 branches, there are M3 branch occlusions demonstrated on series 14, image 29. Venous sinuses: Insufficient venous contrast. Anatomic variants: Dominant left vertebral artery. Dominant left ACA A1 segment. Review of the MIP images confirms the above findings IMPRESSION: 1. CTP estimates a middle Left MCA territory core infarct of 19 mL but only modest additional left MCA territory penumbra resulting in a relatively small mismatch estimated at 14 mL. 2. 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 Patent Left ICA and Left M1. 3. The above was discussed by telephone with Dr. Kerney Elbe on 10/10/2017 at 0906 hours. 4. Superimposed Left ICA calcified atherosclerosis with estimated 60% stenosis distal Left ICA bulb. Up to moderate  contralateral right ICA siphon stenosis due to calcified plaque. 5. Dominant Left vertebral artery with mild to moderate origin stenosis. Patent basilar artery without stenosis. 6. Decreased enhancement of the Right Vertebral Artery relative to the left appears related to moderate to severe origin stenosis and occlusion of the distal right V4 segment. The right PICA origin remains patent. 7. Aortic Atherosclerosis (ICD10-I70.0) and Emphysema (ICD10-J43.9). Electronically Signed   By: Genevie Ann M.D.   On: 10/10/2017 09:30   Ct Angio Neck W Or Wo Contrast  Result Date: 10/10/2017 CLINICAL DATA:  84 year old male with aphasia.  Code stroke. EXAM: CT ANGIOGRAPHY HEAD AND NECK CT PERFUSION BRAIN TECHNIQUE:  Multidetector CT imaging of the head and neck was performed using the standard protocol during bolus administration of intravenous contrast. Multiplanar CT image reconstructions and MIPs were obtained to evaluate the vascular anatomy. Carotid stenosis measurements (when applicable) are obtained utilizing NASCET criteria, using the distal internal carotid diameter as the denominator. Multiphase CT imaging of the brain was performed following IV bolus contrast injection. Subsequent parametric perfusion maps were calculated using RAPID software. CONTRAST:  155mL ISOVUE-370 IOPAMIDOL (ISOVUE-370) INJECTION 76% COMPARISON:  Noncontrast head CT 0829 hours today. Cervical spine MRI 11/24/2011. FINDINGS: CT Brain Perfusion Findings: ASPECTS 10 at 0829 hours today. CBF (<30%) Volume: 19 milliliters Perfusion (Tmax>6.0s) volume: 33 milliliters Mismatch Volume: 14 milliliters Infarction Location:Middle left MCA territory/operculum CTA NECK Skeleton: No acute osseous abnormality identified. Generalized cervical spine degeneration. Upper chest: Small volume retained secretions in the visible trachea. Respiratory motion artifact in the lung apices but evidence of emphysema in the right lung apex. Incidental azygos fissure (normal variant). Other neck: Small volume retained secretions in the hypopharynx. No neck mass or lymphadenopathy. Aortic arch: Aberrancy origin of the right subclavian artery, normal variant. Moderate Calcified aortic atherosclerosis. Right carotid system: No proximal right CCA stenosis despite soft plaque. Calcified plaque at the right carotid bifurcation and right ICA bulb with no significant stenosis. Moderately tortuous mid cervical right ICA with calcified plaque and a kinked appearance, but otherwise no stenosis to the skull base. Left carotid system: Left CCA origin atherosclerosis without stenosis. Fairly bulky left carotid bifurcation calcified plaque tracking into the left ICA origin and bulb.  Stenosis at the distal bulb up to 60 % with respect to the distal vessel (series 11, image 135). Tortuosity and mild ectasia of the left ICA distal to the bulb but no additional stenosis to the skull base. Vertebral arteries: Aberrancy origin of the right subclavian artery with calcified plaque. No significant proximal subclavian stenosis. Calcified plaque at the right vertebral artery origin appears moderate to severe with patent but asymmetrically decreased enhancement of the right vertebral artery when compared to the left throughout the neck and to the skull base. No additional cervical right vertebral artery stenosis. No significant proximal left subclavian artery stenosis despite calcified plaque. At least moderate stenosis of the left vertebral artery origin due to calcified plaque. Left vertebral artery appears dominant and demonstrates superior enhancement versus the right. No additional cervical left vertebral stenosis. CTA HEAD Posterior circulation: Dominant distal left vertebral artery with patent left PICA origin. Asymmetrically decreased enhancement of the right vertebral artery V4 segment, although the right PICA origin remains patent. The distal right V4 appears occluded proximal to the vertebrobasilar junction (series 6, image 66). Patent basilar artery without stenosis. Patent SCA origins. Normal left PCA origin. Fetal type right PCA origin. Left posterior communicating artery is diminutive. The bilateral PCA branches appear patent with mild  irregularity. Anterior circulation: Both ICA siphons are patent. There is moderate bilateral calcified plaque with tortuous cavernous segments. There is mild cavernous left ICA stenosis. There is mild to moderate stenosis of the right ICA siphon proximal cavernous and proximal supraclinoid segments. Normal ophthalmic and posterior communicating artery origins. Patent carotid termini. Patent MCA and left ACA origins. The left ACA is dominant and the right is  diminutive or absent. The anterior communicating artery is ectatic. The bilateral ACA branches are patent with mild irregularity. The right MCA M1 segment, bifurcation, and right MCA branches are patent with mild irregularity. The left MCA M1 segment is patent. At the left MCA trifurcation and anterior division M2 branch is occluded as seen on series 11, image 137. Of the patent M2 branches, there are M3 branch occlusions demonstrated on series 14, image 29. Venous sinuses: Insufficient venous contrast. Anatomic variants: Dominant left vertebral artery. Dominant left ACA A1 segment. Review of the MIP images confirms the above findings IMPRESSION: 1. CTP estimates a middle Left MCA territory core infarct of 19 mL but only modest additional left MCA territory penumbra resulting in a relatively small mismatch estimated at 14 mL. 2. 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 Patent Left ICA and Left M1. 3. The above was discussed by telephone with Dr. Kerney Elbe on 10/10/2017 at 0906 hours. 4. Superimposed Left ICA calcified atherosclerosis with estimated 60% stenosis distal Left ICA bulb. Up to moderate contralateral right ICA siphon stenosis due to calcified plaque. 5. Dominant Left vertebral artery with mild to moderate origin stenosis. Patent basilar artery without stenosis. 6. Decreased enhancement of the Right Vertebral Artery relative to the left appears related to moderate to severe origin stenosis and occlusion of the distal right V4 segment. The right PICA origin remains patent. 7. Aortic Atherosclerosis (ICD10-I70.0) and Emphysema (ICD10-J43.9). Electronically Signed   By: Genevie Ann M.D.   On: 10/10/2017 09:30   Mr Brain Wo Contrast  Result Date: 10/11/2017 CLINICAL DATA:  84 y/o  M; stroke for follow-up. EXAM: MRI HEAD WITHOUT CONTRAST TECHNIQUE: Multiplanar, multiecho pulse sequences of the brain and surrounding structures were obtained without intravenous contrast. COMPARISON:  10/11/2017 MRI head.   10/10/2017 CT head and CTA head. FINDINGS: Brain: Area of reduced diffusion within the left lateral frontal lobe extending into the mid insula compatible with acute/early subacute infarction. Additional small focus of acute/early subacute infarction within the left occipital temporal junction. No associated hemorrhage or mass effect. Small chronic infarcts are present within bilateral cerebellar hemispheres, left thalamus, and left anterior putamen. Moderate chronic microvascular ischemic changes and parenchymal volume loss of the brain for age. Punctate foci of susceptibility hypointensity are present in right cerebellar hemisphere and left frontal periventricular white matter compatible with hemosiderin deposition of chronic microhemorrhage. No hydrocephalus, extra-axial collection, herniation, or effacement of basilar cisterns. Vascular: Normal flow voids. Skull and upper cervical spine: Normal marrow signal. Sinuses/Orbits: Negative.  Bilateral intra-ocular lens replacement. Other: None. IMPRESSION: Acute/early subacute infarction involving the left lateral frontal lobe and mid insula. Additional small focus of acute/early subacute infarction within the left occipitotemporal junction. No associated hemorrhage or mass effect. Electronically Signed   By: Kristine Garbe M.D.   On: 10/11/2017 03:31   Ct Cerebral Perfusion W Contrast  Result Date: 10/10/2017 CLINICAL DATA:  84 year old male with aphasia.  Code stroke. EXAM: CT ANGIOGRAPHY HEAD AND NECK CT PERFUSION BRAIN TECHNIQUE: Multidetector CT imaging of the head and neck was performed using the standard protocol during bolus administration  of intravenous contrast. Multiplanar CT image reconstructions and MIPs were obtained to evaluate the vascular anatomy. Carotid stenosis measurements (when applicable) are obtained utilizing NASCET criteria, using the distal internal carotid diameter as the denominator. Multiphase CT imaging of the brain was  performed following IV bolus contrast injection. Subsequent parametric perfusion maps were calculated using RAPID software. CONTRAST:  183mL ISOVUE-370 IOPAMIDOL (ISOVUE-370) INJECTION 76% COMPARISON:  Noncontrast head CT 0829 hours today. Cervical spine MRI 11/24/2011. FINDINGS: CT Brain Perfusion Findings: ASPECTS 10 at 0829 hours today. CBF (<30%) Volume: 19 milliliters Perfusion (Tmax>6.0s) volume: 33 milliliters Mismatch Volume: 14 milliliters Infarction Location:Middle left MCA territory/operculum CTA NECK Skeleton: No acute osseous abnormality identified. Generalized cervical spine degeneration. Upper chest: Small volume retained secretions in the visible trachea. Respiratory motion artifact in the lung apices but evidence of emphysema in the right lung apex. Incidental azygos fissure (normal variant). Other neck: Small volume retained secretions in the hypopharynx. No neck mass or lymphadenopathy. Aortic arch: Aberrancy origin of the right subclavian artery, normal variant. Moderate Calcified aortic atherosclerosis. Right carotid system: No proximal right CCA stenosis despite soft plaque. Calcified plaque at the right carotid bifurcation and right ICA bulb with no significant stenosis. Moderately tortuous mid cervical right ICA with calcified plaque and a kinked appearance, but otherwise no stenosis to the skull base. Left carotid system: Left CCA origin atherosclerosis without stenosis. Fairly bulky left carotid bifurcation calcified plaque tracking into the left ICA origin and bulb. Stenosis at the distal bulb up to 60 % with respect to the distal vessel (series 11, image 135). Tortuosity and mild ectasia of the left ICA distal to the bulb but no additional stenosis to the skull base. Vertebral arteries: Aberrancy origin of the right subclavian artery with calcified plaque. No significant proximal subclavian stenosis. Calcified plaque at the right vertebral artery origin appears moderate to severe with  patent but asymmetrically decreased enhancement of the right vertebral artery when compared to the left throughout the neck and to the skull base. No additional cervical right vertebral artery stenosis. No significant proximal left subclavian artery stenosis despite calcified plaque. At least moderate stenosis of the left vertebral artery origin due to calcified plaque. Left vertebral artery appears dominant and demonstrates superior enhancement versus the right. No additional cervical left vertebral stenosis. CTA HEAD Posterior circulation: Dominant distal left vertebral artery with patent left PICA origin. Asymmetrically decreased enhancement of the right vertebral artery V4 segment, although the right PICA origin remains patent. The distal right V4 appears occluded proximal to the vertebrobasilar junction (series 6, image 66). Patent basilar artery without stenosis. Patent SCA origins. Normal left PCA origin. Fetal type right PCA origin. Left posterior communicating artery is diminutive. The bilateral PCA branches appear patent with mild irregularity. Anterior circulation: Both ICA siphons are patent. There is moderate bilateral calcified plaque with tortuous cavernous segments. There is mild cavernous left ICA stenosis. There is mild to moderate stenosis of the right ICA siphon proximal cavernous and proximal supraclinoid segments. Normal ophthalmic and posterior communicating artery origins. Patent carotid termini. Patent MCA and left ACA origins. The left ACA is dominant and the right is diminutive or absent. The anterior communicating artery is ectatic. The bilateral ACA branches are patent with mild irregularity. The right MCA M1 segment, bifurcation, and right MCA branches are patent with mild irregularity. The left MCA M1 segment is patent. At the left MCA trifurcation and anterior division M2 branch is occluded as seen on series 11, image 137. Of the patent M2  branches, there are M3 branch occlusions  demonstrated on series 14, image 29. Venous sinuses: Insufficient venous contrast. Anatomic variants: Dominant left vertebral artery. Dominant left ACA A1 segment. Review of the MIP images confirms the above findings IMPRESSION: 1. CTP estimates a middle Left MCA territory core infarct of 19 mL but only modest additional left MCA territory penumbra resulting in a relatively small mismatch estimated at 14 mL. 2. 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 Patent Left ICA and Left M1. 3. The above was discussed by telephone with Dr. Kerney Elbe on 10/10/2017 at 0906 hours. 4. Superimposed Left ICA calcified atherosclerosis with estimated 60% stenosis distal Left ICA bulb. Up to moderate contralateral right ICA siphon stenosis due to calcified plaque. 5. Dominant Left vertebral artery with mild to moderate origin stenosis. Patent basilar artery without stenosis. 6. Decreased enhancement of the Right Vertebral Artery relative to the left appears related to moderate to severe origin stenosis and occlusion of the distal right V4 segment. The right PICA origin remains patent. 7. Aortic Atherosclerosis (ICD10-I70.0) and Emphysema (ICD10-J43.9). Electronically Signed   By: Genevie Ann M.D.   On: 10/10/2017 09:30   Ct Head Code Stroke Wo Contrast  Result Date: 10/10/2017 CLINICAL DATA:  Code stroke.  84 year old male with aphasia. EXAM: CT HEAD WITHOUT CONTRAST TECHNIQUE: Contiguous axial images were obtained from the base of the skull through the vertex without intravenous contrast. COMPARISON:  Cervical spine MRI 11/24/2011. FINDINGS: Brain: No acute intracranial hemorrhage identified. No midline shift, mass effect, or evidence of intracranial mass lesion. Patchy bilateral cerebral white matter hypodensity, relatively mild for age. Some involvement of the basal ganglia, more so on the left. No cortically based acute hemispheric infarct identified, but there is patchy age indeterminate hypodensity in the central right  cerebellum (series 7, image 7 and coronal image 54). No cortical encephalomalacia identified. Vascular: Calcified atherosclerosis at the skull base. No suspicious intracranial vascular hyperdensity. Skull: No acute osseous abnormality identified. Sinuses/Orbits: Well pneumatized. Other: Postoperative changes to both globes. No acute orbit or scalp soft tissue finding identified. ASPECTS [facility] Stroke Program Early CT Score) - Ganglionic level infarction (caudate, lentiform nuclei, internal capsule, insula, M1-M3 cortex): 7 - Supraganglionic infarction (M4-M6 cortex): 3 Total score (0-10 with 10 being normal): 10 IMPRESSION: 1. Age indeterminate mild patchy hypodensity in the right central cerebellum. No intracranial hemorrhage or acute cortically based infarct identified. 2. ASPECTS is 10. 3. These results were communicated to Dr. Cheral Marker at 8:39 amon 5/6/2019by text page via the [facility] messaging system. Electronically Signed   By: Genevie Ann M.D.   On: 10/10/2017 08:40     Scheduled Meds: . aspirin  300 mg Rectal Daily   Or  . aspirin  325 mg Oral Daily  . atorvastatin  80 mg Oral q1800  . enoxaparin (LOVENOX) injection  30 mg Subcutaneous Q24H  . insulin aspart  0-5 Units Subcutaneous QHS  . insulin aspart  0-9 Units Subcutaneous TID WC   Continuous Infusions: . sodium chloride 50 mL/hr (10/10/17 1242)    Marzetta Board, MD, PhD [facility] Pager [phone]  If 7PM-7AM, please contact night-coverage [email or web address] Password [facility] 10/11/2017, 9:17 AM

## 2017-10-11 NOTE — Progress Notes (Signed)
Preliminary results by tech - Venous Duplex Lower Ext. Completed. Negative for deep and superficial vein thrombosis. Ronald Benitez, BS, RDMS, RVT  

## 2017-10-11 NOTE — Progress Notes (Signed)
Echocardiogram 2D Echocardiogram has been performed.  Matilde Bash 10/11/2017, 10:26 AM

## 2017-10-11 NOTE — Evaluation (Signed)
Clinical/Bedside Swallow Evaluation Patient Details  Name: Ronald Benitez MRN: [id number] Date of Birth: 30-Aug-1928  Today's Date: 10/11/2017 Time: SLP Start Time (ACUTE ONLY): 1116 SLP Stop Time (ACUTE ONLY): 1130 SLP Time Calculation (min) (ACUTE ONLY): 14 min  Past Medical History:  Past Medical History:  Diagnosis Date  . Arthritis   . BPH (benign prostatic hyperplasia)   . Bruises easily   . CAD (coronary artery disease) cardiologist-  dr berry   a. 01/16/14 s/p DES x2 to RCA. b remote RCA stenting 03/ 1997; 04/ 1997 stenting OM2;  11/ 1997  stenting pCFX, OM2 (restenosed), angioplasty OM1   . CKD (chronic kidney disease), stage III ([place])   . COPD mixed type [facility]) pulmologist-  dr byrum   mild obstructive and restrictive  . Depression   . Diverticulosis of colon   . Dyspnea   . First degree heart block   . GERD (gastroesophageal reflux disease)   . Hearing loss of both ears   . Hiatal hernia   . History of adenomatous polyp of colon    last colonoscopy 12-06-2016 tubular adenoma w/ high grade hyperplasia (cecal polyp),  tubular adenoma not high grade dysplasia (bx),  tubulovillious adenoma's not high grade dysplasia (sigmoid polyp x2)  . History of coronary artery stent placement    03/ 1997;  04/ 1997;  11/ 1997  (unknown type); DES 01/15/17  . History of non-ST elevation myocardial infarction (NSTEMI) 03/01/2000  . HTN (hypertension)   . Hyperlipidemia   . Idiopathic Parkinson's disease [facility])    neurologist-  dr tat  . Right bundle branch block (RBBB) with left anterior fascicular block (LAFB)   . Sebaceous cyst    chronic of buttock-- per pt open area size tennis ball  . Speech impediment   . Type 2 diabetes mellitus ([place])   . Unsteady gait   . Wears glasses    Past Surgical History:  Past Surgical History:  Procedure Laterality Date  . CARDIAC CATHETERIZATION  02-10-2000  dr Daneen Schick   high-grade functional total occludion of PDA, high-grade obstruction in  branch of the acute margainal branch of RCA,  total occlusion of branch of the D1 with high-grade obstruction in the second branch of D1; mLAD 50%,  third obtuse marginal branch 70-80%; all of these sites of stenosis in the branch vessels are not signifinantly changed from cardiac cath. after PCI 1997,except acute marginal br   . CARDIAC CATHETERIZATION  05-10-2005  dr Tamala Julian   severe CAD w/ total occlusion D1, high-grade obstruction in acute marginal of RCA and PDA, high-grade ostruction in more superior branch of OM1 w/ wide patency of second larger branch of OM1;  continuation severe diffuse disease CFX , has very limited distribution, LAD main portion patent as is prox., mid, & distal RCA; ef 50% w/ mild anterolateral hypokinesis   . CARDIOVASCULAR STRESS TEST  12-12-2013  dr berry   Intermediate nuclear study w/ significant reversible lateral , apical lateral defect which may represent diagonal vessel ischemia/ normal LV wall motion,  lateral hypokinesis w/ ef 48%  . CATARACT EXTRACTION W/ INTRAOCULAR LENS IMPLANT Bilateral 2009 approx.  . COLONOSCOPY WITH PROPOFOL N/A 12/06/2016   Procedure: COLONOSCOPY WITH PROPOFOL;  Surgeon: Wilford Corner, MD;  Location: [place];  Service: Endoscopy;  Laterality: N/A;  . CORONARY ANGIOPLASTY WITH STENT PLACEMENT  08/1995  dr Daneen Schick   stenting to RCA  . CORONARY ANGIOPLASTY WITH STENT PLACEMENT  04/ 1997  dr Daneen Schick  stenting to OM2  . CORONARY ANGIOPLASTY WITH STENT PLACEMENT  11/ 1997  dr Daneen Schick   stenting to proxCFX, OM2 (restenosed), PTCA to OM1  . ESOPHAGOGASTRODUODENOSCOPY ENDOSCOPY    . LEFT HEART CATHETERIZATION WITH CORONARY ANGIOGRAM N/A 01/15/2014   Procedure: LEFT HEART CATHETERIZATION WITH CORONARY ANGIOGRAM;  Surgeon: Lorretta Harp, MD;  Location: [facility] CATH LAB;  Service: Cardiovascular;  Laterality: N/A; occluded moderate size diagonal branch which fills by collaterals faintly;  patent mid AV groove CFX stent, patent mRCA  stent w/ high-grade tandem stenosis's in the mid and distal portion  . MASS EXCISION Left 12/15/2016   Procedure: REMOVAL OF LEFT GLUTEAL SUBCUTANEOUS MASS;  Surgeon: Michael Boston, MD;  Location: [facility];  Service: General;  Laterality: Left;  . OPEN INGUINAL HERNIA REPAIR W/ MESH Right 01/ 24/ 2012   dr Dalbert Batman  . PERCUTANEOUS CORONARY ROTOBLATOR INTERVENTION (PCI-R) N/A 01/16/2014   Procedure: PERCUTANEOUS CORONARY ROTOBLATOR INTERVENTION (PCI-R);  Surgeon: Troy Sine, MD;  Location: [facility] CATH LAB;  Service: Cardiovascular;  Laterality: N/A;  PTCI to RCA and DES x2 to mid and distal RCA  . TONSILLECTOMY  child  . TRANSTHORACIC ECHOCARDIOGRAM  12-06-2013   dr berry   mild focal basal hypertrophy of the septum,  ef 50-55%,  grade 3 diastolic dysfunction/  mild AR/ mild to moderate MR/  increased thickness of the atrial septum consistant with lipomatous hypertrophy and very small PFO (agitated saline contrast study showed small right-to-left atrial level shunt/  trivial PR/  mild RAE    HPI:  Ronald Benitez a 84 y.o.malewith medical history significant ofDM; CAD s/p PCI; Parkinson's disease; HTN; HLD; depression; COPD; and stage 3 CKD presenting with significant neurologic deficits thought to be related to a stroke.This morning, his wife found that he had fallen in the bathroom. She is unsure when it happened - she found him about 715. She left him about 1AM and he was normal in his recliner. His walker was outside the bathroom when she found him. No urinary or fecal incontinence. He has a scalp bruise. He was his normal self yesterday without difficulty. Ambulates with his walker most of the time in the house. No aspiration concerns normally although he does have a lot of phlegm. Code stroke - awake, aphasic, trying to communicate but can't; right neglect, right hemiplegia. Acute/early subacute infarction involving the left lateral frontal lobe and mid insula. Additional small focus  of acute/early subacute infarction within the left occipitotemporal junction. No associated hemorrhage or mass effect.   Assessment / Plan / Recommendation Clinical Impression  Pt presents with severe aspiration risk d/t  no response to presentation of ice chip and with hand over hand cup presentation, all liquid bolus ran out of mouth with no oral/pharyngeal or cognitive response. Recommend continuing NPO status with ST to continue to follow for PO trials/readiness. Wife and multiple family members present. Education provided on severe risk of aspiration, implication of Parkinson's disease and acute stroke may have on prognosis for recovery. All questions answered to their satisfaction. If pt's ability to demonstrate PO readiness doesn't improve over next 24 hours, may need to consider temporary means of nutrition. Education provided to nursing on POC.  SLP Visit Diagnosis: Dysphagia, oropharyngeal phase (R13.12)    Aspiration Risk  Risk for inadequate nutrition/hydration;Severe aspiration risk    Diet Recommendation NPO   Medication Administration: Via alternative means    Other  Recommendations Oral Care Recommendations: Oral care QID   Follow up Recommendations  Inpatient Rehab      Frequency and Duration min 2x/week  2 weeks       Prognosis Prognosis for Safe Diet Advancement: Fair Barriers to Reach Goals: Cognitive deficits;Severity of deficits      Swallow Study   General Date of Onset: 10/11/17 HPI: Ronald Benitez a 84 y.o.malewith medical history significant ofDM; CAD s/p PCI; Parkinson's disease; HTN; HLD; depression; COPD; and stage 3 CKD presenting with significant neurologic deficits thought to be related to a stroke.This morning, his wife found that he had fallen in the bathroom. She is unsure when it happened - she found him about 715. She left him about 1AM and he was normal in his recliner. His walker was outside the bathroom when she found him. No  urinary or fecal incontinence. He has a scalp bruise. He was his normal self yesterday without difficulty. Ambulates with his walker most of the time in the house. No aspiration concerns normally although he does have a lot of phlegm. Code stroke - awake, aphasic, trying to communicate but can't; right neglect, right hemiplegia. Acute/early subacute infarction involving the left lateral frontal lobe and mid insula. Additional small focus of acute/early subacute infarction within the left occipitotemporal junction. No associated hemorrhage or mass effect. Type of Study: Bedside Swallow Evaluation Previous Swallow Assessment: none in chart Diet Prior to this Study: NPO Temperature Spikes Noted: No Respiratory Status: Nasal cannula History of Recent Intubation: No Behavior/Cognition: Alert;Confused;Doesn't follow directions;Requires cueing Oral Cavity Assessment: Within Functional Limits Oral Care Completed by SLP: Recent completion by staff Oral Cavity - Dentition: Adequate natural dentition Vision: Impaired for self-feeding Self-Feeding Abilities: Total assist Patient Positioning: Upright in bed Baseline Vocal Quality: Not observed Volitional Cough: Cognitively unable to elicit Volitional Swallow: Unable to elicit    Oral/Motor/Sensory Function Overall Oral Motor/Sensory Function: (not able to assess and no oral movement with ice chips)   Ice Chips Ice chips: Impaired Presentation: Spoon Oral Phase Impairments: (no oral response to ice chips)   Thin Liquid Thin Liquid: Impaired Presentation: Cup Oral Phase Impairments: (no oral response)    Nectar Thick Nectar Thick Liquid: Not tested   Honey Thick Honey Thick Liquid: Not tested   Puree Puree: Not tested   Solid   GO   Solid: Not tested        Brysin Towery 10/11/2017,12:15 PM

## 2017-10-11 NOTE — Progress Notes (Addendum)
Progress Note  Patient Name: Ronald Benitez Date of Encounter: 10/11/2017  Primary Cardiologist: Quay Burow, MD   Subjective   Pt is sleeping/nonresponsive. Family at bedside.  Inpatient Medications    Scheduled Meds: . aspirin  300 mg Rectal Daily   Or  . aspirin  325 mg Oral Daily  . atorvastatin  80 mg Oral q1800  . enoxaparin (LOVENOX) injection  30 mg Subcutaneous Q24H  . insulin aspart  0-5 Units Subcutaneous QHS  . insulin aspart  0-9 Units Subcutaneous TID WC   Continuous Infusions: . sodium chloride 50 mL/hr (10/10/17 1242)   PRN Meds: acetaminophen **OR** acetaminophen (TYLENOL) oral liquid 160 mg/5 mL **OR** acetaminophen, fentaNYL (SUBLIMAZE) injection   Vital Signs    Vitals:   10/10/17 2200 10/11/17 0000 10/11/17 0535 10/11/17 0758  BP: (!) 143/70 (!) 156/55 (!) 153/72 (!) 150/94  Pulse: 88 89 86 94  Resp: 15  20 20   Temp: 99.5 F (37.5 C) 99.7 F (37.6 C) 100.1 F (37.8 C) (!) 100.9 F (38.3 C)  TempSrc: Axillary Axillary Oral Axillary  SpO2: 95% 93% 99% 98%  Weight:      Height:        Intake/Output Summary (Last 24 hours) at 10/11/2017 1013 Last data filed at 10/11/2017 0543 Gross per 24 hour  Intake 615 ml  Output 650 ml  Net -35 ml   Filed Weights   10/10/17 0906  Weight: 169 lb 5 oz (76.8 kg)    Telemetry    First degree heart block - Personally Reviewed  ECG    No new tracings - Personally Reviewed  Physical Exam   GEN: No acute distress.   Neck: No JVD Cardiac: RRR, no murmurs, rubs, or gallops.  Respiratory: Clear to auscultation bilaterally, diminished in bases GI: Soft, nontender, non-distended  MS: No edema; left leg with redness and warmth Neuro:  Nonfocal  Psych: Normal affect   Labs    Chemistry Recent Labs  Lab 10/10/17 0821 10/10/17 0834  NA 139 142  K 4.0 4.0  CL 106 108  CO2 21*  --   GLUCOSE 156* 154*  BUN 46* 40*  CREATININE 2.62* 2.50*  CALCIUM 9.4  --   PROT 7.0  --   ALBUMIN 3.5   --   AST 27  --   ALT 7*  --   ALKPHOS 73  --   BILITOT 1.3*  --   GFRNONAA 20*  --   GFRAA 24*  --   ANIONGAP 12  --      Hematology Recent Labs  Lab 10/10/17 0821 10/10/17 0834  WBC 9.2  --   RBC 3.75*  --   HGB 11.5* 12.2*  HCT 35.7* 36.0*  MCV 95.2  --   MCH 30.7  --   MCHC 32.2  --   RDW 16.5*  --   PLT 215  --     Cardiac Enzymes Recent Labs  Lab 10/10/17 1315 10/10/17 1804 10/11/17 0019 10/11/17 0621  TROPONINI 0.18* 0.59* 1.34* 1.71*    Recent Labs  Lab 10/10/17 0833  TROPIPOC 0.12*     BNPNo results for input(s): BNP, PROBNP in the last 168 hours.   DDimer No results for input(s): DDIMER in the last 168 hours.   Radiology    Ct Angio Head W Or Wo Contrast  Result Date: 10/10/2017 CLINICAL DATA:  84 year old male with aphasia.  Code stroke. EXAM: CT ANGIOGRAPHY HEAD AND NECK CT PERFUSION BRAIN TECHNIQUE: Multidetector  CT imaging of the head and neck was performed using the standard protocol during bolus administration of intravenous contrast. Multiplanar CT image reconstructions and MIPs were obtained to evaluate the vascular anatomy. Carotid stenosis measurements (when applicable) are obtained utilizing NASCET criteria, using the distal internal carotid diameter as the denominator. Multiphase CT imaging of the brain was performed following IV bolus contrast injection. Subsequent parametric perfusion maps were calculated using RAPID software. CONTRAST:  172mL ISOVUE-370 IOPAMIDOL (ISOVUE-370) INJECTION 76% COMPARISON:  Noncontrast head CT 0829 hours today. Cervical spine MRI 11/24/2011. FINDINGS: CT Brain Perfusion Findings: ASPECTS 10 at 0829 hours today. CBF (<30%) Volume: 19 milliliters Perfusion (Tmax>6.0s) volume: 33 milliliters Mismatch Volume: 14 milliliters Infarction Location:Middle left MCA territory/operculum CTA NECK Skeleton: No acute osseous abnormality identified. Generalized cervical spine degeneration. Upper chest: Small volume retained  secretions in the visible trachea. Respiratory motion artifact in the lung apices but evidence of emphysema in the right lung apex. Incidental azygos fissure (normal variant). Other neck: Small volume retained secretions in the hypopharynx. No neck mass or lymphadenopathy. Aortic arch: Aberrancy origin of the right subclavian artery, normal variant. Moderate Calcified aortic atherosclerosis. Right carotid system: No proximal right CCA stenosis despite soft plaque. Calcified plaque at the right carotid bifurcation and right ICA bulb with no significant stenosis. Moderately tortuous mid cervical right ICA with calcified plaque and a kinked appearance, but otherwise no stenosis to the skull base. Left carotid system: Left CCA origin atherosclerosis without stenosis. Fairly bulky left carotid bifurcation calcified plaque tracking into the left ICA origin and bulb. Stenosis at the distal bulb up to 60 % with respect to the distal vessel (series 11, image 135). Tortuosity and mild ectasia of the left ICA distal to the bulb but no additional stenosis to the skull base. Vertebral arteries: Aberrancy origin of the right subclavian artery with calcified plaque. No significant proximal subclavian stenosis. Calcified plaque at the right vertebral artery origin appears moderate to severe with patent but asymmetrically decreased enhancement of the right vertebral artery when compared to the left throughout the neck and to the skull base. No additional cervical right vertebral artery stenosis. No significant proximal left subclavian artery stenosis despite calcified plaque. At least moderate stenosis of the left vertebral artery origin due to calcified plaque. Left vertebral artery appears dominant and demonstrates superior enhancement versus the right. No additional cervical left vertebral stenosis. CTA HEAD Posterior circulation: Dominant distal left vertebral artery with patent left PICA origin. Asymmetrically decreased  enhancement of the right vertebral artery V4 segment, although the right PICA origin remains patent. The distal right V4 appears occluded proximal to the vertebrobasilar junction (series 6, image 66). Patent basilar artery without stenosis. Patent SCA origins. Normal left PCA origin. Fetal type right PCA origin. Left posterior communicating artery is diminutive. The bilateral PCA branches appear patent with mild irregularity. Anterior circulation: Both ICA siphons are patent. There is moderate bilateral calcified plaque with tortuous cavernous segments. There is mild cavernous left ICA stenosis. There is mild to moderate stenosis of the right ICA siphon proximal cavernous and proximal supraclinoid segments. Normal ophthalmic and posterior communicating artery origins. Patent carotid termini. Patent MCA and left ACA origins. The left ACA is dominant and the right is diminutive or absent. The anterior communicating artery is ectatic. The bilateral ACA branches are patent with mild irregularity. The right MCA M1 segment, bifurcation, and right MCA branches are patent with mild irregularity. The left MCA M1 segment is patent. At the left MCA trifurcation and anterior  division M2 branch is occluded as seen on series 11, image 137. Of the patent M2 branches, there are M3 branch occlusions demonstrated on series 14, image 29. Venous sinuses: Insufficient venous contrast. Anatomic variants: Dominant left vertebral artery. Dominant left ACA A1 segment. Review of the MIP images confirms the above findings IMPRESSION: 1. CTP estimates a middle Left MCA territory core infarct of 19 mL but only modest additional left MCA territory penumbra resulting in a relatively small mismatch estimated at 14 mL. 2. 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 Patent Left ICA and Left M1. 3. The above was discussed by telephone with Dr. Kerney Elbe on 10/10/2017 at 0906 hours. 4. Superimposed Left ICA calcified atherosclerosis with  estimated 60% stenosis distal Left ICA bulb. Up to moderate contralateral right ICA siphon stenosis due to calcified plaque. 5. Dominant Left vertebral artery with mild to moderate origin stenosis. Patent basilar artery without stenosis. 6. Decreased enhancement of the Right Vertebral Artery relative to the left appears related to moderate to severe origin stenosis and occlusion of the distal right V4 segment. The right PICA origin remains patent. 7. Aortic Atherosclerosis (ICD10-I70.0) and Emphysema (ICD10-J43.9). Electronically Signed   By: Genevie Ann M.D.   On: 10/10/2017 09:30   Ct Angio Neck W Or Wo Contrast  Result Date: 10/10/2017 CLINICAL DATA:  84 year old male with aphasia.  Code stroke. EXAM: CT ANGIOGRAPHY HEAD AND NECK CT PERFUSION BRAIN TECHNIQUE: Multidetector CT imaging of the head and neck was performed using the standard protocol during bolus administration of intravenous contrast. Multiplanar CT image reconstructions and MIPs were obtained to evaluate the vascular anatomy. Carotid stenosis measurements (when applicable) are obtained utilizing NASCET criteria, using the distal internal carotid diameter as the denominator. Multiphase CT imaging of the brain was performed following IV bolus contrast injection. Subsequent parametric perfusion maps were calculated using RAPID software. CONTRAST:  144mL ISOVUE-370 IOPAMIDOL (ISOVUE-370) INJECTION 76% COMPARISON:  Noncontrast head CT 0829 hours today. Cervical spine MRI 11/24/2011. FINDINGS: CT Brain Perfusion Findings: ASPECTS 10 at 0829 hours today. CBF (<30%) Volume: 19 milliliters Perfusion (Tmax>6.0s) volume: 33 milliliters Mismatch Volume: 14 milliliters Infarction Location:Middle left MCA territory/operculum CTA NECK Skeleton: No acute osseous abnormality identified. Generalized cervical spine degeneration. Upper chest: Small volume retained secretions in the visible trachea. Respiratory motion artifact in the lung apices but evidence of emphysema  in the right lung apex. Incidental azygos fissure (normal variant). Other neck: Small volume retained secretions in the hypopharynx. No neck mass or lymphadenopathy. Aortic arch: Aberrancy origin of the right subclavian artery, normal variant. Moderate Calcified aortic atherosclerosis. Right carotid system: No proximal right CCA stenosis despite soft plaque. Calcified plaque at the right carotid bifurcation and right ICA bulb with no significant stenosis. Moderately tortuous mid cervical right ICA with calcified plaque and a kinked appearance, but otherwise no stenosis to the skull base. Left carotid system: Left CCA origin atherosclerosis without stenosis. Fairly bulky left carotid bifurcation calcified plaque tracking into the left ICA origin and bulb. Stenosis at the distal bulb up to 60 % with respect to the distal vessel (series 11, image 135). Tortuosity and mild ectasia of the left ICA distal to the bulb but no additional stenosis to the skull base. Vertebral arteries: Aberrancy origin of the right subclavian artery with calcified plaque. No significant proximal subclavian stenosis. Calcified plaque at the right vertebral artery origin appears moderate to severe with patent but asymmetrically decreased enhancement of the right vertebral artery when compared to the left throughout  the neck and to the skull base. No additional cervical right vertebral artery stenosis. No significant proximal left subclavian artery stenosis despite calcified plaque. At least moderate stenosis of the left vertebral artery origin due to calcified plaque. Left vertebral artery appears dominant and demonstrates superior enhancement versus the right. No additional cervical left vertebral stenosis. CTA HEAD Posterior circulation: Dominant distal left vertebral artery with patent left PICA origin. Asymmetrically decreased enhancement of the right vertebral artery V4 segment, although the right PICA origin remains patent. The distal  right V4 appears occluded proximal to the vertebrobasilar junction (series 6, image 66). Patent basilar artery without stenosis. Patent SCA origins. Normal left PCA origin. Fetal type right PCA origin. Left posterior communicating artery is diminutive. The bilateral PCA branches appear patent with mild irregularity. Anterior circulation: Both ICA siphons are patent. There is moderate bilateral calcified plaque with tortuous cavernous segments. There is mild cavernous left ICA stenosis. There is mild to moderate stenosis of the right ICA siphon proximal cavernous and proximal supraclinoid segments. Normal ophthalmic and posterior communicating artery origins. Patent carotid termini. Patent MCA and left ACA origins. The left ACA is dominant and the right is diminutive or absent. The anterior communicating artery is ectatic. The bilateral ACA branches are patent with mild irregularity. The right MCA M1 segment, bifurcation, and right MCA branches are patent with mild irregularity. The left MCA M1 segment is patent. At the left MCA trifurcation and anterior division M2 branch is occluded as seen on series 11, image 137. Of the patent M2 branches, there are M3 branch occlusions demonstrated on series 14, image 29. Venous sinuses: Insufficient venous contrast. Anatomic variants: Dominant left vertebral artery. Dominant left ACA A1 segment. Review of the MIP images confirms the above findings IMPRESSION: 1. CTP estimates a middle Left MCA territory core infarct of 19 mL but only modest additional left MCA territory penumbra resulting in a relatively small mismatch estimated at 14 mL. 2. 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 Patent Left ICA and Left M1. 3. The above was discussed by telephone with Dr. Kerney Elbe on 10/10/2017 at 0906 hours. 4. Superimposed Left ICA calcified atherosclerosis with estimated 60% stenosis distal Left ICA bulb. Up to moderate contralateral right ICA siphon stenosis due to calcified  plaque. 5. Dominant Left vertebral artery with mild to moderate origin stenosis. Patent basilar artery without stenosis. 6. Decreased enhancement of the Right Vertebral Artery relative to the left appears related to moderate to severe origin stenosis and occlusion of the distal right V4 segment. The right PICA origin remains patent. 7. Aortic Atherosclerosis (ICD10-I70.0) and Emphysema (ICD10-J43.9). Electronically Signed   By: Genevie Ann M.D.   On: 10/10/2017 09:30   Mr Brain Wo Contrast  Result Date: 10/11/2017 CLINICAL DATA:  84 y/o  M; stroke for follow-up. EXAM: MRI HEAD WITHOUT CONTRAST TECHNIQUE: Multiplanar, multiecho pulse sequences of the brain and surrounding structures were obtained without intravenous contrast. COMPARISON:  10/11/2017 MRI head.  10/10/2017 CT head and CTA head. FINDINGS: Brain: Area of reduced diffusion within the left lateral frontal lobe extending into the mid insula compatible with acute/early subacute infarction. Additional small focus of acute/early subacute infarction within the left occipital temporal junction. No associated hemorrhage or mass effect. Small chronic infarcts are present within bilateral cerebellar hemispheres, left thalamus, and left anterior putamen. Moderate chronic microvascular ischemic changes and parenchymal volume loss of the brain for age. Punctate foci of susceptibility hypointensity are present in right cerebellar hemisphere and left frontal periventricular  white matter compatible with hemosiderin deposition of chronic microhemorrhage. No hydrocephalus, extra-axial collection, herniation, or effacement of basilar cisterns. Vascular: Normal flow voids. Skull and upper cervical spine: Normal marrow signal. Sinuses/Orbits: Negative.  Bilateral intra-ocular lens replacement. Other: None. IMPRESSION: Acute/early subacute infarction involving the left lateral frontal lobe and mid insula. Additional small focus of acute/early subacute infarction within the left  occipitotemporal junction. No associated hemorrhage or mass effect. Electronically Signed   By: Kristine Garbe M.D.   On: 10/11/2017 03:31   Ct Cerebral Perfusion W Contrast  Result Date: 10/10/2017 CLINICAL DATA:  84 year old male with aphasia.  Code stroke. EXAM: CT ANGIOGRAPHY HEAD AND NECK CT PERFUSION BRAIN TECHNIQUE: Multidetector CT imaging of the head and neck was performed using the standard protocol during bolus administration of intravenous contrast. Multiplanar CT image reconstructions and MIPs were obtained to evaluate the vascular anatomy. Carotid stenosis measurements (when applicable) are obtained utilizing NASCET criteria, using the distal internal carotid diameter as the denominator. Multiphase CT imaging of the brain was performed following IV bolus contrast injection. Subsequent parametric perfusion maps were calculated using RAPID software. CONTRAST:  17mL ISOVUE-370 IOPAMIDOL (ISOVUE-370) INJECTION 76% COMPARISON:  Noncontrast head CT 0829 hours today. Cervical spine MRI 11/24/2011. FINDINGS: CT Brain Perfusion Findings: ASPECTS 10 at 0829 hours today. CBF (<30%) Volume: 19 milliliters Perfusion (Tmax>6.0s) volume: 33 milliliters Mismatch Volume: 14 milliliters Infarction Location:Middle left MCA territory/operculum CTA NECK Skeleton: No acute osseous abnormality identified. Generalized cervical spine degeneration. Upper chest: Small volume retained secretions in the visible trachea. Respiratory motion artifact in the lung apices but evidence of emphysema in the right lung apex. Incidental azygos fissure (normal variant). Other neck: Small volume retained secretions in the hypopharynx. No neck mass or lymphadenopathy. Aortic arch: Aberrancy origin of the right subclavian artery, normal variant. Moderate Calcified aortic atherosclerosis. Right carotid system: No proximal right CCA stenosis despite soft plaque. Calcified plaque at the right carotid bifurcation and right ICA bulb  with no significant stenosis. Moderately tortuous mid cervical right ICA with calcified plaque and a kinked appearance, but otherwise no stenosis to the skull base. Left carotid system: Left CCA origin atherosclerosis without stenosis. Fairly bulky left carotid bifurcation calcified plaque tracking into the left ICA origin and bulb. Stenosis at the distal bulb up to 60 % with respect to the distal vessel (series 11, image 135). Tortuosity and mild ectasia of the left ICA distal to the bulb but no additional stenosis to the skull base. Vertebral arteries: Aberrancy origin of the right subclavian artery with calcified plaque. No significant proximal subclavian stenosis. Calcified plaque at the right vertebral artery origin appears moderate to severe with patent but asymmetrically decreased enhancement of the right vertebral artery when compared to the left throughout the neck and to the skull base. No additional cervical right vertebral artery stenosis. No significant proximal left subclavian artery stenosis despite calcified plaque. At least moderate stenosis of the left vertebral artery origin due to calcified plaque. Left vertebral artery appears dominant and demonstrates superior enhancement versus the right. No additional cervical left vertebral stenosis. CTA HEAD Posterior circulation: Dominant distal left vertebral artery with patent left PICA origin. Asymmetrically decreased enhancement of the right vertebral artery V4 segment, although the right PICA origin remains patent. The distal right V4 appears occluded proximal to the vertebrobasilar junction (series 6, image 66). Patent basilar artery without stenosis. Patent SCA origins. Normal left PCA origin. Fetal type right PCA origin. Left posterior communicating artery is diminutive. The bilateral PCA branches appear  patent with mild irregularity. Anterior circulation: Both ICA siphons are patent. There is moderate bilateral calcified plaque with tortuous  cavernous segments. There is mild cavernous left ICA stenosis. There is mild to moderate stenosis of the right ICA siphon proximal cavernous and proximal supraclinoid segments. Normal ophthalmic and posterior communicating artery origins. Patent carotid termini. Patent MCA and left ACA origins. The left ACA is dominant and the right is diminutive or absent. The anterior communicating artery is ectatic. The bilateral ACA branches are patent with mild irregularity. The right MCA M1 segment, bifurcation, and right MCA branches are patent with mild irregularity. The left MCA M1 segment is patent. At the left MCA trifurcation and anterior division M2 branch is occluded as seen on series 11, image 137. Of the patent M2 branches, there are M3 branch occlusions demonstrated on series 14, image 29. Venous sinuses: Insufficient venous contrast. Anatomic variants: Dominant left vertebral artery. Dominant left ACA A1 segment. Review of the MIP images confirms the above findings IMPRESSION: 1. CTP estimates a middle Left MCA territory core infarct of 19 mL but only modest additional left MCA territory penumbra resulting in a relatively small mismatch estimated at 14 mL. 2. 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 Patent Left ICA and Left M1. 3. The above was discussed by telephone with Dr. Kerney Elbe on 10/10/2017 at 0906 hours. 4. Superimposed Left ICA calcified atherosclerosis with estimated 60% stenosis distal Left ICA bulb. Up to moderate contralateral right ICA siphon stenosis due to calcified plaque. 5. Dominant Left vertebral artery with mild to moderate origin stenosis. Patent basilar artery without stenosis. 6. Decreased enhancement of the Right Vertebral Artery relative to the left appears related to moderate to severe origin stenosis and occlusion of the distal right V4 segment. The right PICA origin remains patent. 7. Aortic Atherosclerosis (ICD10-I70.0) and Emphysema (ICD10-J43.9). Electronically Signed    By: Genevie Ann M.D.   On: 10/10/2017 09:30   Ct Head Code Stroke Wo Contrast  Result Date: 10/10/2017 CLINICAL DATA:  Code stroke.  84 year old male with aphasia. EXAM: CT HEAD WITHOUT CONTRAST TECHNIQUE: Contiguous axial images were obtained from the base of the skull through the vertex without intravenous contrast. COMPARISON:  Cervical spine MRI 11/24/2011. FINDINGS: Brain: No acute intracranial hemorrhage identified. No midline shift, mass effect, or evidence of intracranial mass lesion. Patchy bilateral cerebral white matter hypodensity, relatively mild for age. Some involvement of the basal ganglia, more so on the left. No cortically based acute hemispheric infarct identified, but there is patchy age indeterminate hypodensity in the central right cerebellum (series 7, image 7 and coronal image 54). No cortical encephalomalacia identified. Vascular: Calcified atherosclerosis at the skull base. No suspicious intracranial vascular hyperdensity. Skull: No acute osseous abnormality identified. Sinuses/Orbits: Well pneumatized. Other: Postoperative changes to both globes. No acute orbit or scalp soft tissue finding identified. ASPECTS [facility] Stroke Program Early CT Score) - Ganglionic level infarction (caudate, lentiform nuclei, internal capsule, insula, M1-M3 cortex): 7 - Supraganglionic infarction (M4-M6 cortex): 3 Total score (0-10 with 10 being normal): 10 IMPRESSION: 1. Age indeterminate mild patchy hypodensity in the right central cerebellum. No intracranial hemorrhage or acute cortically based infarct identified. 2. ASPECTS is 10. 3. These results were communicated to Dr. Cheral Marker at 8:39 amon 5/6/2019by text page via the [facility] messaging system. Electronically Signed   By: Genevie Ann M.D.   On: 10/10/2017 08:40    Cardiac Studies   Echo 10/10/17: pending   Patient Profile     84  y.o. male with a hx of CAD s/p DES X2 to RCA 2015 and multiple remote stents, RBBB & LAFB, CKD stage III, HTN, HLD, DM type 2,  COPD, GERD, HH, Parkinson's disease  who is being seen for abnormal EKG and elevated troponin. He as found to have a CVA.   Assessment & Plan    1. Elevated troponin and EKG changes - thought to be related to his acute CVA and RBBB and LAFB vs demand ischemia - not a cath or anticoagulation candidate in the acute period - troponin 0.18 --> 0.59 --> 1.34 --> 1.71 - echo pending final read   2. First degree heart block - no Afib on telemetry overnight   3. CAD - Hx MI and multiple PCI/stents. Last cath done in 01/2014 with complex atherectomy and DES X2 to RCA - prior, was only on ASA   4. CVA - per neurology - appears to have had a significant/devestatign stroke   5. HTN - Allowing permissive hypertension is setting of stroke. Plans to treat BP only if >220/120, then with goal of 15% reduction.  - holding lopressor and captopril for permissive BP - restart in another 24-48 hrs - per neurology   6. HLD - switched to lipitor 80 mg - 10/11/2017: Cholesterol 120; HDL 44; LDL Cholesterol 61; Triglycerides 76; VLDL 15 - LDL at goal   7. Left leg redness and warmth - will defer to primary   For questions or updates, please contact [facility] Please consult [email or web address] for contact info under Cardiology/STEMI.      Signed, Tami Lin Riku Buttery, PA  10/11/2017, 10:13 AM

## 2017-10-11 NOTE — Progress Notes (Signed)
Initial Nutrition Assessment  DOCUMENTATION CODES:   Not applicable  INTERVENTION:  Monitor for needs  NUTRITION DIAGNOSIS:   Inadequate oral intake related to inability to eat as evidenced by NPO status.  GOAL:   Patient will meet greater than or equal to 90% of their needs  MONITOR:   Diet advancement, Weight trends, I & O's  REASON FOR ASSESSMENT:   Consult Other (Comment)(CVA)  ASSESSMENT:   Ronald Benitez is a 84 y.o. male with a hx of CAD s/p DES X2 to RCA 2015 and multiple remote stents, RBBB & LAFB, CKD stage III, HTN, HLD, DM type 2, COPD, GERD, HH, Parkinson's disease. He was found unconscious on the bathroom floor with potentially devastating CVA. He exhibits aphasia and R-sided neglect  Spoke with patient's daughter at bedside. She is a Adult nurse.   Patient was very sleepy, unarousable during visit. She reports that he was eating well PTA, eats 3 meals a day with multiple snacks. Denies any weight loss. UBW of 168 pounds.  Weight appears to have fluctuated between 162-169 pounds over the past 9 months with an outlier weight of 177 pounds on 06/23/2017 noted. He does exhibit some muscle wasting and fat depletions but seems like this may be related to his age and parkinson's rather than PO intake.  He is currently NPO, failed swallow eval per speech due to no response with presentation of ice chips to patient. Currently at high risk for aspiration. RD will monitor for diet advancement and needs  Labs reviewed Medications reviewed and include:  NS at 36mL/hr  NUTRITION - FOCUSED PHYSICAL EXAM:    Most Recent Value  Orbital Region  Mild depletion  Upper Arm Region  Mild depletion  Thoracic and Lumbar Region  Unable to assess  Buccal Region  Mild depletion  Temple Region  Moderate depletion  Clavicle Bone Region  Moderate depletion  Clavicle and Acromion Bone Region  Moderate depletion  Scapular Bone Region  Unable to assess  Dorsal Hand  No  depletion  Patellar Region  Moderate depletion  Anterior Thigh Region  Moderate depletion  Posterior Calf Region  Moderate depletion       Diet Order:   Diet Order           Diet NPO time specified  Diet effective now          EDUCATION NEEDS:   Not appropriate for education at this time  Skin:  Skin Assessment: Reviewed RN Assessment(Bilateral ecchymosis to arms)  Last BM:  PTA  Height:   Ht Readings from Last 1 Encounters:  10/10/17 5\' 11"  (1.803 m)    Weight:   Wt Readings from Last 1 Encounters:  10/10/17 169 lb 5 oz (76.8 kg)    Ideal Body Weight:  78.18 kg  BMI:  Body mass index is 23.61 kg/m.  Estimated Nutritional Needs:   Kcal:  2951-8841 calories (MSJ x1.2-1.4)   Protein:  92-108 grams (1.2-1.4g/kg)  Fluid:  1.8-2.0L  Satira Anis. Shabazz Mckey, MS, RD LDN Inpatient Clinical Dietitian Pager [phone]

## 2017-10-12 ENCOUNTER — Inpatient Hospital Stay (HOSPITAL_COMMUNITY): Payer: Medicare Other

## 2017-10-12 DIAGNOSIS — R1310 Dysphagia, unspecified: Secondary | ICD-10-CM

## 2017-10-12 DIAGNOSIS — G20 Parkinson's disease: Secondary | ICD-10-CM

## 2017-10-12 LAB — BASIC METABOLIC PANEL
ANION GAP: 10 (ref 5–15)
BUN: 41 mg/dL — ABNORMAL HIGH (ref 6–20)
CO2: 21 mmol/L — ABNORMAL LOW (ref 22–32)
Calcium: 9.1 mg/dL (ref 8.9–10.3)
Chloride: 112 mmol/L — ABNORMAL HIGH (ref 101–111)
Creatinine, Ser: 2.35 mg/dL — ABNORMAL HIGH (ref 0.61–1.24)
GFR, EST AFRICAN AMERICAN: 27 mL/min — AB (ref >60)
GFR, EST NON AFRICAN AMERICAN: 23 mL/min — AB (ref >60)
Glucose, Bld: 136 mg/dL — ABNORMAL HIGH (ref 65–99)
POTASSIUM: 4.1 mmol/L (ref 3.5–5.1)
SODIUM: 143 mmol/L (ref 135–145)

## 2017-10-12 LAB — CBC
HCT: 35.1 % — ABNORMAL LOW (ref 39.0–52.0)
Hemoglobin: 11.2 g/dL — ABNORMAL LOW (ref 13.0–17.0)
MCH: 30.4 pg (ref 26.0–34.0)
MCHC: 31.9 g/dL (ref 30.0–36.0)
MCV: 95.1 fL (ref 78.0–100.0)
PLATELETS: 212 10*3/uL (ref 150–400)
RBC: 3.69 MIL/uL — AB (ref 4.22–5.81)
RDW: 16.8 % — ABNORMAL HIGH (ref 11.5–15.5)
WBC: 8.4 10*3/uL (ref 4.0–10.5)

## 2017-10-12 LAB — GLUCOSE, CAPILLARY
GLUCOSE-CAPILLARY: 128 mg/dL — AB (ref 65–99)
GLUCOSE-CAPILLARY: 142 mg/dL — AB (ref 65–99)
GLUCOSE-CAPILLARY: 108 mg/dL — AB (ref 65–99)
GLUCOSE-CAPILLARY: 115 mg/dL — AB (ref 65–99)
Glucose-Capillary: 122 mg/dL — ABNORMAL HIGH (ref 65–99)
Glucose-Capillary: 119 mg/dL — ABNORMAL HIGH (ref 65–99)
Glucose-Capillary: 120 mg/dL — ABNORMAL HIGH (ref 65–99)

## 2017-10-12 MED ORDER — ACETAMINOPHEN 10 MG/ML IV SOLN
1000.0000 mg | Freq: Four times a day (QID) | INTRAVENOUS | Status: AC | PRN
  Administered 2017-10-13 (×2): 1000 mg via INTRAVENOUS
  Filled 2017-10-12 (×3): qty 100

## 2017-10-12 MED ORDER — SODIUM CHLORIDE 0.9 % IV SOLN
3.0000 g | Freq: Two times a day (BID) | INTRAVENOUS | Status: DC
  Administered 2017-10-12 – 2017-10-16 (×9): 3 g via INTRAVENOUS
  Filled 2017-10-12 (×9): qty 3

## 2017-10-12 MED ORDER — INSULIN ASPART 100 UNIT/ML ~~LOC~~ SOLN
0.0000 [IU] | SUBCUTANEOUS | Status: DC
  Administered 2017-10-13 – 2017-10-15 (×6): 1 [IU] via SUBCUTANEOUS

## 2017-10-12 NOTE — Progress Notes (Signed)
Modified Barium Swallow Progress Note  Patient Details  Name: Ronald Benitez MRN: [id number] Date of Birth: Apr 26, 1929  Today's Date: 10/12/2017  Modified Barium Swallow completed.  Full report located under Chart Review in the Imaging Section.  Brief recommendations include the following:  Clinical Impression  Pt presents with mod-severe oropharyngeal dysphagia marked by poor oral attention, reduced labial seal with anterior spillage; oral holding of POs with limited effort to propel to pharynx.  There was passive spilling of POs into pharynx, with reduced hyolaryngeal mobility/poor epiglottic closure, leading to immediate aspiration of thin liquids.  Cough response did not eject penetrate/aspirate. Nectar liquids penetrated the larynx; purees rested in pharynx and oral cavity.  Study was discontinued at this time and oral residue was removed via suctioning.  Pt is a high aspiration risk at this time.  Video and nature of dysphagia was viewed and discussed with pt's wife and daughter.  Results D/W Dr. Tyrell Antonio.  Palliative medicine consult is potentially pending.  Recommend NPO for now.    Swallow Evaluation Recommendations       SLP Diet Recommendations: NPO                                Ronald Benitez 10/12/2017,1:50 PM

## 2017-10-12 NOTE — Progress Notes (Addendum)
STROKE TEAM PROGRESS NOTE   INTERVAL HISTORY His wife and daughter are at the bedside. He failed the MBSS. We discussed panda vs PEG vs comfort care. Dr. Tyrell Antonio joined the discussion. Also discussed SNF vs CIR with SNF favored and wife/dtr agreeable. Wife wants to think about her options, hold off on panda and discuss with her family. She is agreeable to palliative consult which Dr. Tyrell Antonio will arrange.   At baseline, patient was able to feed self and toilet self.  He would use a walker in the house.  Wife bathed him.   Vitals:   10/11/17 1604 10/11/17 2018 10/11/17 2336 10/12/17 0402  BP: (!) 137/57 137/72 (!) 136/59 (!) 125/58  Pulse: 74 73 72 87  Resp: 16 18 18 18   Temp: 99.9 F (37.7 C) 97.9 F (36.6 C) 98.6 F (37 C) 97.7 F (36.5 C)  TempSrc: Axillary Axillary Oral Oral  SpO2: 100% 98% 100% 99%  Weight:      Height:        CBC:  CBC Latest Ref Rng & Units 10/12/2017 10/10/2017 10/10/2017  WBC 4.0 - 10.5 K/uL 8.4 - 9.2  Hemoglobin 13.0 - 17.0 g/dL 11.2(L) 12.2(L) 11.5(L)  Hematocrit 39.0 - 52.0 % 35.1(L) 36.0(L) 35.7(L)  Platelets 150 - 400 K/uL 212 - 215     Comprehensive Metabolic Panel:   CMP Latest Ref Rng & Units 10/12/2017 10/10/2017 10/10/2017  Glucose 65 - 99 mg/dL 136(H) 154(H) 156(H)  BUN 6 - 20 mg/dL 41(H) 40(H) 46(H)  Creatinine 0.61 - 1.24 mg/dL 2.35(H) 2.50(H) 2.62(H)  Sodium 135 - 145 mmol/L 143 142 139  Potassium 3.5 - 5.1 mmol/L 4.1 4.0 4.0  Chloride 101 - 111 mmol/L 112(H) 108 106  CO2 22 - 32 mmol/L 21(L) - 21(L)  Calcium 8.9 - 10.3 mg/dL 9.1 - 9.4  Total Protein 6.5 - 8.1 g/dL - - 7.0  Total Bilirubin 0.3 - 1.2 mg/dL - - 1.3(H)  Alkaline Phos 38 - 126 U/L - - 73  AST 15 - 41 U/L - - 27  ALT 17 - 63 U/L - - 7(L)   Lipid Panel:     Component Value Date/Time   CHOL 120 10/11/2017 0621   TRIG 76 10/11/2017 0621   HDL 44 10/11/2017 0621   CHOLHDL 2.7 10/11/2017 0621   VLDL 15 10/11/2017 0621   LDLCALC 61 10/11/2017 0621   HgbA1c:  Lab Results   Component Value Date   HGBA1C 8.1 (H) 10/10/2017   Urine Drug Screen:     Component Value Date/Time   LABOPIA NONE DETECTED 10/10/2017 0821   COCAINSCRNUR NONE DETECTED 10/10/2017 0821   LABBENZ NONE DETECTED 10/10/2017 0821   AMPHETMU NONE DETECTED 10/10/2017 0821   THCU NONE DETECTED 10/10/2017 0821   LABBARB NONE DETECTED 10/10/2017 0821    Alcohol Level     Component Value Date/Time   ETH <10 10/10/2017 1030    IMAGING Ct Angio Head W Or Wo Contrast  Result Date: 10/10/2017 CLINICAL DATA:  84 year old male with aphasia.  Code stroke. EXAM: CT ANGIOGRAPHY HEAD AND NECK CT PERFUSION BRAIN TECHNIQUE: Multidetector CT imaging of the head and neck was performed using the standard protocol during bolus administration of intravenous contrast. Multiplanar CT image reconstructions and MIPs were obtained to evaluate the vascular anatomy. Carotid stenosis measurements (when applicable) are obtained utilizing NASCET criteria, using the distal internal carotid diameter as the denominator. Multiphase CT imaging of the brain was performed following IV bolus contrast injection. Subsequent parametric perfusion  maps were calculated using RAPID software. CONTRAST:  112m ISOVUE-370 IOPAMIDOL (ISOVUE-370) INJECTION 76% COMPARISON:  Noncontrast head CT 0829 hours today. Cervical spine MRI 11/24/2011. FINDINGS: CT Brain Perfusion Findings: ASPECTS 10 at 0829 hours today. CBF (<30%) Volume: 19 milliliters Perfusion (Tmax>6.0s) volume: 33 milliliters Mismatch Volume: 14 milliliters Infarction Location:Middle left MCA territory/operculum CTA NECK Skeleton: No acute osseous abnormality identified. Generalized cervical spine degeneration. Upper chest: Small volume retained secretions in the visible trachea. Respiratory motion artifact in the lung apices but evidence of emphysema in the right lung apex. Incidental azygos fissure (normal variant). Other neck: Small volume retained secretions in the hypopharynx. No  neck mass or lymphadenopathy. Aortic arch: Aberrancy origin of the right subclavian artery, normal variant. Moderate Calcified aortic atherosclerosis. Right carotid system: No proximal right CCA stenosis despite soft plaque. Calcified plaque at the right carotid bifurcation and right ICA bulb with no significant stenosis. Moderately tortuous mid cervical right ICA with calcified plaque and a kinked appearance, but otherwise no stenosis to the skull base. Left carotid system: Left CCA origin atherosclerosis without stenosis. Fairly bulky left carotid bifurcation calcified plaque tracking into the left ICA origin and bulb. Stenosis at the distal bulb up to 60 % with respect to the distal vessel (series 11, image 135). Tortuosity and mild ectasia of the left ICA distal to the bulb but no additional stenosis to the skull base. Vertebral arteries: Aberrancy origin of the right subclavian artery with calcified plaque. No significant proximal subclavian stenosis. Calcified plaque at the right vertebral artery origin appears moderate to severe with patent but asymmetrically decreased enhancement of the right vertebral artery when compared to the left throughout the neck and to the skull base. No additional cervical right vertebral artery stenosis. No significant proximal left subclavian artery stenosis despite calcified plaque. At least moderate stenosis of the left vertebral artery origin due to calcified plaque. Left vertebral artery appears dominant and demonstrates superior enhancement versus the right. No additional cervical left vertebral stenosis. CTA HEAD Posterior circulation: Dominant distal left vertebral artery with patent left PICA origin. Asymmetrically decreased enhancement of the right vertebral artery V4 segment, although the right PICA origin remains patent. The distal right V4 appears occluded proximal to the vertebrobasilar junction (series 6, image 66). Patent basilar artery without stenosis. Patent SCA  origins. Normal left PCA origin. Fetal type right PCA origin. Left posterior communicating artery is diminutive. The bilateral PCA branches appear patent with mild irregularity. Anterior circulation: Both ICA siphons are patent. There is moderate bilateral calcified plaque with tortuous cavernous segments. There is mild cavernous left ICA stenosis. There is mild to moderate stenosis of the right ICA siphon proximal cavernous and proximal supraclinoid segments. Normal ophthalmic and posterior communicating artery origins. Patent carotid termini. Patent MCA and left ACA origins. The left ACA is dominant and the right is diminutive or absent. The anterior communicating artery is ectatic. The bilateral ACA branches are patent with mild irregularity. The right MCA M1 segment, bifurcation, and right MCA branches are patent with mild irregularity. The left MCA M1 segment is patent. At the left MCA trifurcation and anterior division M2 branch is occluded as seen on series 11, image 137. Of the patent M2 branches, there are M3 branch occlusions demonstrated on series 14, image 29. Venous sinuses: Insufficient venous contrast. Anatomic variants: Dominant left vertebral artery. Dominant left ACA A1 segment. Review of the MIP images confirms the above findings IMPRESSION: 1. CTP estimates a middle Left MCA territory core infarct of 19 mL  but only modest additional left MCA territory penumbra resulting in a relatively small mismatch estimated at 14 mL. 2. 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 Patent Left ICA and Left M1. 3. The above was discussed by telephone with Dr. Kerney Elbe on 10/10/2017 at 0906 hours. 4. Superimposed Left ICA calcified atherosclerosis with estimated 60% stenosis distal Left ICA bulb. Up to moderate contralateral right ICA siphon stenosis due to calcified plaque. 5. Dominant Left vertebral artery with mild to moderate origin stenosis. Patent basilar artery without stenosis. 6. Decreased  enhancement of the Right Vertebral Artery relative to the left appears related to moderate to severe origin stenosis and occlusion of the distal right V4 segment. The right PICA origin remains patent. 7. Aortic Atherosclerosis (ICD10-I70.0) and Emphysema (ICD10-J43.9). Electronically Signed   By: Genevie Ann M.D.   On: 10/10/2017 09:30   Ct Angio Neck W Or Wo Contrast  Result Date: 10/10/2017 CLINICAL DATA:  84 year old male with aphasia.  Code stroke. EXAM: CT ANGIOGRAPHY HEAD AND NECK CT PERFUSION BRAIN TECHNIQUE: Multidetector CT imaging of the head and neck was performed using the standard protocol during bolus administration of intravenous contrast. Multiplanar CT image reconstructions and MIPs were obtained to evaluate the vascular anatomy. Carotid stenosis measurements (when applicable) are obtained utilizing NASCET criteria, using the distal internal carotid diameter as the denominator. Multiphase CT imaging of the brain was performed following IV bolus contrast injection. Subsequent parametric perfusion maps were calculated using RAPID software. CONTRAST:  145m ISOVUE-370 IOPAMIDOL (ISOVUE-370) INJECTION 76% COMPARISON:  Noncontrast head CT 0829 hours today. Cervical spine MRI 11/24/2011. FINDINGS: CT Brain Perfusion Findings: ASPECTS 10 at 0829 hours today. CBF (<30%) Volume: 19 milliliters Perfusion (Tmax>6.0s) volume: 33 milliliters Mismatch Volume: 14 milliliters Infarction Location:Middle left MCA territory/operculum CTA NECK Skeleton: No acute osseous abnormality identified. Generalized cervical spine degeneration. Upper chest: Small volume retained secretions in the visible trachea. Respiratory motion artifact in the lung apices but evidence of emphysema in the right lung apex. Incidental azygos fissure (normal variant). Other neck: Small volume retained secretions in the hypopharynx. No neck mass or lymphadenopathy. Aortic arch: Aberrancy origin of the right subclavian artery, normal variant.  Moderate Calcified aortic atherosclerosis. Right carotid system: No proximal right CCA stenosis despite soft plaque. Calcified plaque at the right carotid bifurcation and right ICA bulb with no significant stenosis. Moderately tortuous mid cervical right ICA with calcified plaque and a kinked appearance, but otherwise no stenosis to the skull base. Left carotid system: Left CCA origin atherosclerosis without stenosis. Fairly bulky left carotid bifurcation calcified plaque tracking into the left ICA origin and bulb. Stenosis at the distal bulb up to 60 % with respect to the distal vessel (series 11, image 135). Tortuosity and mild ectasia of the left ICA distal to the bulb but no additional stenosis to the skull base. Vertebral arteries: Aberrancy origin of the right subclavian artery with calcified plaque. No significant proximal subclavian stenosis. Calcified plaque at the right vertebral artery origin appears moderate to severe with patent but asymmetrically decreased enhancement of the right vertebral artery when compared to the left throughout the neck and to the skull base. No additional cervical right vertebral artery stenosis. No significant proximal left subclavian artery stenosis despite calcified plaque. At least moderate stenosis of the left vertebral artery origin due to calcified plaque. Left vertebral artery appears dominant and demonstrates superior enhancement versus the right. No additional cervical left vertebral stenosis. CTA HEAD Posterior circulation: Dominant distal left vertebral artery with patent  left PICA origin. Asymmetrically decreased enhancement of the right vertebral artery V4 segment, although the right PICA origin remains patent. The distal right V4 appears occluded proximal to the vertebrobasilar junction (series 6, image 66). Patent basilar artery without stenosis. Patent SCA origins. Normal left PCA origin. Fetal type right PCA origin. Left posterior communicating artery is  diminutive. The bilateral PCA branches appear patent with mild irregularity. Anterior circulation: Both ICA siphons are patent. There is moderate bilateral calcified plaque with tortuous cavernous segments. There is mild cavernous left ICA stenosis. There is mild to moderate stenosis of the right ICA siphon proximal cavernous and proximal supraclinoid segments. Normal ophthalmic and posterior communicating artery origins. Patent carotid termini. Patent MCA and left ACA origins. The left ACA is dominant and the right is diminutive or absent. The anterior communicating artery is ectatic. The bilateral ACA branches are patent with mild irregularity. The right MCA M1 segment, bifurcation, and right MCA branches are patent with mild irregularity. The left MCA M1 segment is patent. At the left MCA trifurcation and anterior division M2 branch is occluded as seen on series 11, image 137. Of the patent M2 branches, there are M3 branch occlusions demonstrated on series 14, image 29. Venous sinuses: Insufficient venous contrast. Anatomic variants: Dominant left vertebral artery. Dominant left ACA A1 segment. Review of the MIP images confirms the above findings IMPRESSION: 1. CTP estimates a middle Left MCA territory core infarct of 19 mL but only modest additional left MCA territory penumbra resulting in a relatively small mismatch estimated at 14 mL. 2. 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 Patent Left ICA and Left M1. 3. The above was discussed by telephone with Dr. Kerney Elbe on 10/10/2017 at 0906 hours. 4. Superimposed Left ICA calcified atherosclerosis with estimated 60% stenosis distal Left ICA bulb. Up to moderate contralateral right ICA siphon stenosis due to calcified plaque. 5. Dominant Left vertebral artery with mild to moderate origin stenosis. Patent basilar artery without stenosis. 6. Decreased enhancement of the Right Vertebral Artery relative to the left appears related to moderate to severe origin  stenosis and occlusion of the distal right V4 segment. The right PICA origin remains patent. 7. Aortic Atherosclerosis (ICD10-I70.0) and Emphysema (ICD10-J43.9). Electronically Signed   By: Genevie Ann M.D.   On: 10/10/2017 09:30   Mr Brain Wo Contrast  Result Date: 10/11/2017 CLINICAL DATA:  84 y/o  M; stroke for follow-up. EXAM: MRI HEAD WITHOUT CONTRAST TECHNIQUE: Multiplanar, multiecho pulse sequences of the brain and surrounding structures were obtained without intravenous contrast. COMPARISON:  10/11/2017 MRI head.  10/10/2017 CT head and CTA head. FINDINGS: Brain: Area of reduced diffusion within the left lateral frontal lobe extending into the mid insula compatible with acute/early subacute infarction. Additional small focus of acute/early subacute infarction within the left occipital temporal junction. No associated hemorrhage or mass effect. Small chronic infarcts are present within bilateral cerebellar hemispheres, left thalamus, and left anterior putamen. Moderate chronic microvascular ischemic changes and parenchymal volume loss of the brain for age. Punctate foci of susceptibility hypointensity are present in right cerebellar hemisphere and left frontal periventricular white matter compatible with hemosiderin deposition of chronic microhemorrhage. No hydrocephalus, extra-axial collection, herniation, or effacement of basilar cisterns. Vascular: Normal flow voids. Skull and upper cervical spine: Normal marrow signal. Sinuses/Orbits: Negative.  Bilateral intra-ocular lens replacement. Other: None. IMPRESSION: Acute/early subacute infarction involving the left lateral frontal lobe and mid insula. Additional small focus of acute/early subacute infarction within the left occipitotemporal junction. No associated  hemorrhage or mass effect. Electronically Signed   By: Kristine Garbe M.D.   On: 10/11/2017 03:31   Ct Cerebral Perfusion W Contrast  Result Date: 10/10/2017 CLINICAL DATA:  84 year old  male with aphasia.  Code stroke. EXAM: CT ANGIOGRAPHY HEAD AND NECK CT PERFUSION BRAIN TECHNIQUE: Multidetector CT imaging of the head and neck was performed using the standard protocol during bolus administration of intravenous contrast. Multiplanar CT image reconstructions and MIPs were obtained to evaluate the vascular anatomy. Carotid stenosis measurements (when applicable) are obtained utilizing NASCET criteria, using the distal internal carotid diameter as the denominator. Multiphase CT imaging of the brain was performed following IV bolus contrast injection. Subsequent parametric perfusion maps were calculated using RAPID software. CONTRAST:  181m ISOVUE-370 IOPAMIDOL (ISOVUE-370) INJECTION 76% COMPARISON:  Noncontrast head CT 0829 hours today. Cervical spine MRI 11/24/2011. FINDINGS: CT Brain Perfusion Findings: ASPECTS 10 at 0829 hours today. CBF (<30%) Volume: 19 milliliters Perfusion (Tmax>6.0s) volume: 33 milliliters Mismatch Volume: 14 milliliters Infarction Location:Middle left MCA territory/operculum CTA NECK Skeleton: No acute osseous abnormality identified. Generalized cervical spine degeneration. Upper chest: Small volume retained secretions in the visible trachea. Respiratory motion artifact in the lung apices but evidence of emphysema in the right lung apex. Incidental azygos fissure (normal variant). Other neck: Small volume retained secretions in the hypopharynx. No neck mass or lymphadenopathy. Aortic arch: Aberrancy origin of the right subclavian artery, normal variant. Moderate Calcified aortic atherosclerosis. Right carotid system: No proximal right CCA stenosis despite soft plaque. Calcified plaque at the right carotid bifurcation and right ICA bulb with no significant stenosis. Moderately tortuous mid cervical right ICA with calcified plaque and a kinked appearance, but otherwise no stenosis to the skull base. Left carotid system: Left CCA origin atherosclerosis without stenosis. Fairly  bulky left carotid bifurcation calcified plaque tracking into the left ICA origin and bulb. Stenosis at the distal bulb up to 60 % with respect to the distal vessel (series 11, image 135). Tortuosity and mild ectasia of the left ICA distal to the bulb but no additional stenosis to the skull base. Vertebral arteries: Aberrancy origin of the right subclavian artery with calcified plaque. No significant proximal subclavian stenosis. Calcified plaque at the right vertebral artery origin appears moderate to severe with patent but asymmetrically decreased enhancement of the right vertebral artery when compared to the left throughout the neck and to the skull base. No additional cervical right vertebral artery stenosis. No significant proximal left subclavian artery stenosis despite calcified plaque. At least moderate stenosis of the left vertebral artery origin due to calcified plaque. Left vertebral artery appears dominant and demonstrates superior enhancement versus the right. No additional cervical left vertebral stenosis. CTA HEAD Posterior circulation: Dominant distal left vertebral artery with patent left PICA origin. Asymmetrically decreased enhancement of the right vertebral artery V4 segment, although the right PICA origin remains patent. The distal right V4 appears occluded proximal to the vertebrobasilar junction (series 6, image 66). Patent basilar artery without stenosis. Patent SCA origins. Normal left PCA origin. Fetal type right PCA origin. Left posterior communicating artery is diminutive. The bilateral PCA branches appear patent with mild irregularity. Anterior circulation: Both ICA siphons are patent. There is moderate bilateral calcified plaque with tortuous cavernous segments. There is mild cavernous left ICA stenosis. There is mild to moderate stenosis of the right ICA siphon proximal cavernous and proximal supraclinoid segments. Normal ophthalmic and posterior communicating artery origins. Patent  carotid termini. Patent MCA and left ACA origins. The left ACA is dominant  and the right is diminutive or absent. The anterior communicating artery is ectatic. The bilateral ACA branches are patent with mild irregularity. The right MCA M1 segment, bifurcation, and right MCA branches are patent with mild irregularity. The left MCA M1 segment is patent. At the left MCA trifurcation and anterior division M2 branch is occluded as seen on series 11, image 137. Of the patent M2 branches, there are M3 branch occlusions demonstrated on series 14, image 29. Venous sinuses: Insufficient venous contrast. Anatomic variants: Dominant left vertebral artery. Dominant left ACA A1 segment. Review of the MIP images confirms the above findings IMPRESSION: 1. CTP estimates a middle Left MCA territory core infarct of 19 mL but only modest additional left MCA territory penumbra resulting in a relatively small mismatch estimated at 14 mL. 2. 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 Patent Left ICA and Left M1. 3. The above was discussed by telephone with Dr. Kerney Elbe on 10/10/2017 at 0906 hours. 4. Superimposed Left ICA calcified atherosclerosis with estimated 60% stenosis distal Left ICA bulb. Up to moderate contralateral right ICA siphon stenosis due to calcified plaque. 5. Dominant Left vertebral artery with mild to moderate origin stenosis. Patent basilar artery without stenosis. 6. Decreased enhancement of the Right Vertebral Artery relative to the left appears related to moderate to severe origin stenosis and occlusion of the distal right V4 segment. The right PICA origin remains patent. 7. Aortic Atherosclerosis (ICD10-I70.0) and Emphysema (ICD10-J43.9). Electronically Signed   By: Genevie Ann M.D.   On: 10/10/2017 09:30   Dg Chest Port 1 View  Result Date: 10/11/2017 CLINICAL DATA:  84 year old male with fever.  Initial encounter. EXAM: PORTABLE CHEST 1 VIEW COMPARISON:  07/23/2015 chest x-ray. FINDINGS: Pulmonary  vascular congestion/pulmonary edema. Superimposed patchy consolidation left upper lobe and possibly right lung base may represent infectious process given patient's history of fever. Heart size top-normal. Calcified aorta. IMPRESSION: Pulmonary vascular congestion/pulmonary edema. Superimposed patchy consolidation left upper lobe and possibly right lung base may represent infectious process given patient's history of fever. Follow-up until clearance recommended to exclude underlying mass. Aortic Atherosclerosis (ICD10-I70.0). Electronically Signed   By: Genia Del M.D.   On: 10/11/2017 11:15   Ct Head Code Stroke Wo Contrast  Result Date: 10/10/2017 CLINICAL DATA:  Code stroke.  84 year old male with aphasia. EXAM: CT HEAD WITHOUT CONTRAST TECHNIQUE: Contiguous axial images were obtained from the base of the skull through the vertex without intravenous contrast. COMPARISON:  Cervical spine MRI 11/24/2011. FINDINGS: Brain: No acute intracranial hemorrhage identified. No midline shift, mass effect, or evidence of intracranial mass lesion. Patchy bilateral cerebral white matter hypodensity, relatively mild for age. Some involvement of the basal ganglia, more so on the left. No cortically based acute hemispheric infarct identified, but there is patchy age indeterminate hypodensity in the central right cerebellum (series 7, image 7 and coronal image 54). No cortical encephalomalacia identified. Vascular: Calcified atherosclerosis at the skull base. No suspicious intracranial vascular hyperdensity. Skull: No acute osseous abnormality identified. Sinuses/Orbits: Well pneumatized. Other: Postoperative changes to both globes. No acute orbit or scalp soft tissue finding identified. ASPECTS [facility] Stroke Program Early CT Score) - Ganglionic level infarction (caudate, lentiform nuclei, internal capsule, insula, M1-M3 cortex): 7 - Supraganglionic infarction (M4-M6 cortex): 3 Total score (0-10 with 10 being normal): 10  IMPRESSION: 1. Age indeterminate mild patchy hypodensity in the right central cerebellum. No intracranial hemorrhage or acute cortically based infarct identified. 2. ASPECTS is 10. 3. These results were communicated to Dr.  Lindzen at 8:39 amon 5/6/2019by text page via the [facility] messaging system. Electronically Signed   By: Genevie Ann M.D.   On: 10/10/2017 08:40   2D echocardiogram - Left ventricle: The cavity size was normal. Wall thickness was increased in a pattern of severe LVH. Systolic function was mildly reduced. The estimated ejection fraction was in the range of 45% to 50%. There is akinesis of the basallateral and inferior myocardium. Features are consistent with a pseudonormal left ventricular filling pattern, with concomitant abnormal relaxation and increased filling pressure (grade 2 diastolic dysfunction). - Aortic valve: There was trivial regurgitation. - Mitral valve: Calcified annulus. Impressions:   Small area of akinesis in the basal inferior and lateral walls with overall mildly reduced LV systolic function; severe LVH; moderate diastolic dysfunction; trace AI.  Lower Extremity Doppler Negative for deep and superficial vein thrombosis.   PHYSICAL EXAM HEENT: [place]/AT Lungs: oral respirations mildly labored. Breath sounds cleat to auscultation.  Cardiac: HRR Ext: Noncyanotic  Neurological Exam: Mental Status: more awake and alert today. Does not follow commands. Does not attempt to communicate. Does grimace to pain.  Cranial Nerves: II:  Does not blink to threat on the R, blinks on the L. PERRL. left pupil irregular.   III,IV, VI: No ptosis. Conservation officer, nature. No nystagmus.  V,VII: right facial weakness VIII: looks toward voice, to Left more than right IX,X: Does not verbalize XI: Head preferentially rotated to left XII: Does not protrude tongue to command Motor/Sensory: RUE: Mild rigidity with 2/5 movement to noxious stimuli RLE: withdrawals to noxious stimuli. Will maintain  position when propped up. LUE: Cogwheel rigidity with prominent resting tremor noted. Withdraws briskly to noxious stimuli. Will raise arm independently and can move purposefully. LLE: Mild rigidity without withdrawal to noxious stimuli. Will maintain position when propped up.  Plantars: Mute bilaterally  Cerebellar/Gait: deferred  ASSESSMENT/PLAN Mr. DMITRI PETTIGREW is a 84 y.o. male with history of hypertension, hyperlipidemia, coronary artery disease, chronic kidney disease, diabetes, Parkinson's, first-degree heart block and known PFO found down in the bathroom presenting with right hemiparesis and global aphasia.   Stroke:   Left lateral frontal/mid insular infarct and small occipital infarct, appears embolic secondary to unknown source   Code Stroke CT head age indeterminate patchy hypodensity right central cerebellum.  ASPECTS 10.     CTA head & neck L MCA M2 and M3 branch occlusions. L ICA 60% stenosis.  LVA mild to moderate stenosis.  R VA appears moderate to severe origin stenosis.  RV for occlusion.  Aortic atherosclerosis.  Emphysema.  CT perfusion L MCA infarct of 19 mL with only 14 mL penumbra  MRI L lateral frontal and mid insular infarct.  Small left occipital infarct.  2D Echo  EF 45-50%. No source of embolus.  Grade 2 diastolic dysfunction  Known PFO   LE doppler negative   Consider loop recorder prior to d/c to look for atrial fibrillation as source of stroke (Seen by cardiology.  EKG done by EMS does not show atrial fibrillation or flutter.  No evidence of other atrial arrhythmias other than PAC.)  LDL 61  HgbA1c 8.1  Lovenox 30 mg sq daily for VTE prophylaxis  NPO. ST following. Failed MBSS today. Discussed panda vs PEG vs comfort with wife and daughter. Will hold on panda/TF today until she talks to family.   aspirin 81 mg daily and clopidogrel 75 mg daily prior to admission, now on aspirin 300 mg suppository daily.   Given relatively mild stroke  and  concomitant co-morbidities, will ask palliative to consult for end of life goals. Family states he did not like to speak of those things.  Therapy recommendations:  CIR. Consult placed but recommend SNF. Agree as I doubt wife will be able to physically provide care after limited CIR stay.   Disposition:  pending   Hypertension  Stable . Permissive hypertension (OK if < 220/120) but gradually normalize in 5-7 days . Long-term BP goal normotensive  Hyperlipidemia  Home meds:  zocor 80, changed to lipitor 80 in hospital though unable to swallow  LDL 61, goal < 70  Continue statin at discharge  Diabetes type II  HgbA1c 8.1, goal < 7.0  Uncontrolled  Other Stroke Risk Factors  Advanced age  Murmur cigarette smoker, quit about 4 years ago   Coronary artery disease - MI, angioplasty and stents  Known PFO  Other Active Problems  Parkinson's disease, followed by Dr. Carles Collet  chronic kidney disease stage III creatinine 2.5  Elevated troponin likely related to acute stroke.  Seen by cardiology  Hospital day # [place], MSN, APRN, ANVP-BC, AGPCNP-BC Advanced Practice Stroke Nurse [place] for Schedule & Pager information 10/12/2017 8:15 AM   ATTENDING NOTE: I reviewed above note and agree with the assessment and plan. I have made any additions or clarifications directly to the above note. Pt was seen and examined.   Pt no acute events overnight.  On examination today, patient more awake than yesterday, eyes open, however still global aphasia except able to close and open eyes appropriately request.  Right upper and lower extremity weakness improved from yesterday also.  However, did not pass swallow, currently still n.p.o. with IV fluid.  I had long discussion with wife, daughter, sons at bedside, updated pt current condition, treatment plan and potential prognosis.  Answered their questions.  They have palliative care meeting tomorrow morning.   Currently, family have difficulty to decide NG tube temporarily for the next several days vs PEG tube for SNF arrangement.  As per wife, patient would not want PEG tube for nursing home, however, is may be a good idea to give temporal nutrition by NG tube and continue observe for the next couple days to see if there is any improvement.  Family will have further discussion tonight and tomorrow with palliative care.  Rosalin Hawking, MD PhD Stroke Neurology 10/12/2017 6:13 PM  I spent  35 minutes in total face-to-face time with the patient, more than 50% of which was spent in counseling and coordination of care, reviewing test results, images and medication, and discussing the diagnosis of stroke, and discussion of potential prognosis in the setting of stroke and PD and dysphasia, treatment options and potential prognosis. This patient's care requiresreview of multiple databases, neurological assessment, discussion with family, other specialists and medical decision making of high complexity.  I had long discussion with family members and answered all their questions to their satisfaction.   To contact Stroke Continuity provider, please refer to [email or web address] After hours, contact General Neurology

## 2017-10-12 NOTE — Progress Notes (Signed)
Pharmacy Antibiotic Note  Ronald Benitez is a 84 y.o. male admitted on 10/10/2017 with acute CVA.   Pharmacy has been consulted on 10/12/17 for Unasyn dosing for aspiration pneumonia. CKD stage III, SCr 2.5>2.35, CrCl ~ 23.6 ml/min.   WBC wnl, Tc 99.1, Tm 100.9 on 5/7.    Plan: Unasyn 3 g IV q12 hours Monitor clinical status including renal function, culture results.     Height: 5\' 11"  (180.3 cm) Weight: 169 lb 5 oz (76.8 kg) IBW/kg (Calculated) : 75.3  Temp (24hrs), Avg:98.9 F (37.2 C), Min:97.7 F (36.5 C), Max:100 F (37.8 C)  Recent Labs  Lab 10/10/17 0821 10/10/17 0834 10/12/17 0534  WBC 9.2  --  8.4  CREATININE 2.62* 2.50* 2.35*    Estimated Creatinine Clearance: 23.6 mL/min (A) (by C-G formula based on SCr of 2.35 mg/dL (H)).    No Known Allergies  Antimicrobials this admission: Unasyn 5/8>>  Dose adjustments this admission:   Microbiology results: none  Thank you for allowing pharmacy to be a part of this patient's care. Nicole Cella, RPh Clinical Pharmacist Pager: [phone] [place] [phone] 10/12/2017 10:57 AM

## 2017-10-12 NOTE — Progress Notes (Signed)
PROGRESS NOTE    Ronald Benitez  IZT:[id number] DOB: [birth year] DOA: 10/10/2017 PCP: [name], MD   Brief Narrative: 84 yo M with good prior functional status, history of DM, CAD s/p PCI, Parkinson's ds, HTN, HLD, CKD who was brought to the hospital on 5/6 after being found down in the bathroom by his wife around 7 am. He was noted to have right sided hemiplegia and neglect. Neurology was consulted in the ED. Cardiology was consulted as well for positive troponin and ST segment changes on EKG. No reports of chest discomfort prior to this episode.       Assessment & Plan:   Principal Problem:   CVA (cerebral vascular accident) ([place]) Active Problems:   Essential hypertension   Hyperlipidemia   Chronic renal disease, stage III (HCC)   CAD (coronary artery disease)   DM (diabetes mellitus) ([place])   Elevated troponin   Acute ischemic stroke ([place])  1-Acute CVA -MRI on admission showed acute/early subacute infarction involving the left lateral frontal lobe and mid insula. Additional small focus of acute/early subacute infarction within the left occipitotemporal junction. No associated hemorrhage or mass effect. -neurology following.  -ECHO ef lower 45 %, wall motion abnormalities.  -LDL 61 A1-c 8.1 -Aspirin per rectum.  -Fail swallow evaluation. Family unsure about NG tube, Peg tube. Will ask palliative care for goals of care.   2-PNA; Fever, chest x ray with patchy consolidation left upper lobe and possible right bases. Started IV antibiotics to cover for PNA.    3-Elevation troponin;  ECHO with wall motion abnormalities.  Cardiology following. Await rec.  Peak to 1.9  DM; SSI.   CKD stage IV; Cr at 2.3 stable. Continue to monitor.   HTN;  Permissive HTN.   Parkinson's Diseases;  Medications on hold due to NPO status.   DVT prophylaxis: lovenox Code Status: DNR, discussed with wife. Patient wouldn't want to be resuscitated.  Family Communication: care  discussed with wife and sons, and subsequently with daughter  Disposition Plan: to be determine. Palliative consulted, for goals of care.   Consultants:   Neurology  Cardiology   Palliative.      Procedures:   ECHO;    Antimicrobials:  Unasyn 5-08   Subjective: He is aphasic, eyes open, more alert.    Objective: Vitals:   10/11/17 2018 10/11/17 2336 10/12/17 0402 10/12/17 0829  BP: 137/72 (!) 136/59 (!) 125/58 (!) 150/97  Pulse: 73 72 87 97  Resp: 18 18 18 18   Temp: 97.9 F (36.6 C) 98.6 F (37 C) 97.7 F (36.5 C) 99.1 F (37.3 C)  TempSrc: Axillary Oral Oral Axillary  SpO2: 98% 100% 99% 100%  Weight:      Height:        Intake/Output Summary (Last 24 hours) at 10/12/2017 1040 Last data filed at 10/12/2017 0900 Gross per 24 hour  Intake 267.5 ml  Output 1375 ml  Net -1107.5 ml   Filed Weights   10/10/17 0906  Weight: 76.8 kg (169 lb 5 oz)    Examination:  General exam: Appears calm and comfortable  Respiratory system: bilateral ronchus,  Cardiovascular system: S1 & S2 heard, RRR. No JVD, murmurs, rubs, gallops or clicks. No pedal edema. Left leg with not significant redness.  Gastrointestinal system: Abdomen is nondistended, soft and nontender. No organomegaly or masses felt. Normal bowel sounds heard. Central nervous system: Aphasic, right side weakness,  Extremities: no edema Skin: No rashes, lesions or ulcers    Data Reviewed: I  have personally reviewed following labs and imaging studies  CBC: Recent Labs  Lab 10/10/17 0821 10/10/17 0834 10/12/17 0534  WBC 9.2  --  8.4  NEUTROABS 6.9  --   --   HGB 11.5* 12.2* 11.2*  HCT 35.7* 36.0* 35.1*  MCV 95.2  --  95.1  PLT 215  --  563   Basic Metabolic Panel: Recent Labs  Lab 10/10/17 0821 10/10/17 0834 10/12/17 0534  NA 139 142 143  K 4.0 4.0 4.1  CL 106 108 112*  CO2 21*  --  21*  GLUCOSE 156* 154* 136*  BUN 46* 40* 41*  CREATININE 2.62* 2.50* 2.35*  CALCIUM 9.4  --  9.1    GFR: Estimated Creatinine Clearance: 23.6 mL/min (A) (by C-G formula based on SCr of 2.35 mg/dL (H)). Liver Function Tests: Recent Labs  Lab 10/10/17 0821  AST 27  ALT 7*  ALKPHOS 73  BILITOT 1.3*  PROT 7.0  ALBUMIN 3.5   No results for input(s): LIPASE, AMYLASE in the last 168 hours. No results for input(s): AMMONIA in the last 168 hours. Coagulation Profile: Recent Labs  Lab 10/10/17 0821  INR 1.10   Cardiac Enzymes: Recent Labs  Lab 10/10/17 1804 10/11/17 0019 10/11/17 0621 10/11/17 1214 10/11/17 1733  TROPONINI 0.59* 1.34* 1.71* 1.97* 1.79*   BNP (last 3 results) No results for input(s): PROBNP in the last 8760 hours. HbA1C: Recent Labs    10/10/17 1315  HGBA1C 8.1*   CBG: Recent Labs  Lab 10/11/17 0627 10/11/17 1152 10/11/17 1604 10/11/17 2218 10/12/17 0615  GLUCAP 139* 115* 97 78 122*   Lipid Profile: Recent Labs    10/11/17 0621  CHOL 120  HDL 44  LDLCALC 61  TRIG 76  CHOLHDL 2.7   Thyroid Function Tests: Recent Labs    10/10/17 1315  TSH 3.338   Anemia Panel: No results for input(s): VITAMINB12, FOLATE, FERRITIN, TIBC, IRON, RETICCTPCT in the last 72 hours. Sepsis Labs: No results for input(s): PROCALCITON, LATICACIDVEN in the last 168 hours.  No results found for this or any previous visit (from the past 240 hour(s)).       Radiology Studies: Mr Brain 61 Contrast  Result Date: 10/11/2017 CLINICAL DATA:  84 y/o  M; stroke for follow-up. EXAM: MRI HEAD WITHOUT CONTRAST TECHNIQUE: Multiplanar, multiecho pulse sequences of the brain and surrounding structures were obtained without intravenous contrast. COMPARISON:  10/11/2017 MRI head.  10/10/2017 CT head and CTA head. FINDINGS: Brain: Area of reduced diffusion within the left lateral frontal lobe extending into the mid insula compatible with acute/early subacute infarction. Additional small focus of acute/early subacute infarction within the left occipital temporal junction. No  associated hemorrhage or mass effect. Small chronic infarcts are present within bilateral cerebellar hemispheres, left thalamus, and left anterior putamen. Moderate chronic microvascular ischemic changes and parenchymal volume loss of the brain for age. Punctate foci of susceptibility hypointensity are present in right cerebellar hemisphere and left frontal periventricular white matter compatible with hemosiderin deposition of chronic microhemorrhage. No hydrocephalus, extra-axial collection, herniation, or effacement of basilar cisterns. Vascular: Normal flow voids. Skull and upper cervical spine: Normal marrow signal. Sinuses/Orbits: Negative.  Bilateral intra-ocular lens replacement. Other: None. IMPRESSION: Acute/early subacute infarction involving the left lateral frontal lobe and mid insula. Additional small focus of acute/early subacute infarction within the left occipitotemporal junction. No associated hemorrhage or mass effect. Electronically Signed   By: Kristine Garbe M.D.   On: 10/11/2017 03:31   Dg Chest Port 1  View  Result Date: 10/11/2017 CLINICAL DATA:  84 year old male with fever.  Initial encounter. EXAM: PORTABLE CHEST 1 VIEW COMPARISON:  07/23/2015 chest x-ray. FINDINGS: Pulmonary vascular congestion/pulmonary edema. Superimposed patchy consolidation left upper lobe and possibly right lung base may represent infectious process given patient's history of fever. Heart size top-normal. Calcified aorta. IMPRESSION: Pulmonary vascular congestion/pulmonary edema. Superimposed patchy consolidation left upper lobe and possibly right lung base may represent infectious process given patient's history of fever. Follow-up until clearance recommended to exclude underlying mass. Aortic Atherosclerosis (ICD10-I70.0). Electronically Signed   By: Genia Del M.D.   On: 10/11/2017 11:15        Scheduled Meds: . aspirin  300 mg Rectal Daily   Or  . aspirin  325 mg Oral Daily  .  atorvastatin  80 mg Oral q1800  . enoxaparin (LOVENOX) injection  30 mg Subcutaneous Q24H  . insulin aspart  0-5 Units Subcutaneous QHS  . insulin aspart  0-9 Units Subcutaneous TID WC   Continuous Infusions: . acetaminophen    . dextrose 5 % and 0.45% NaCl 1,000 mL (10/11/17 2241)     LOS: 2 days    Time spent: 35 minutes.     Elmarie Shiley, MD [facility] Pager [phone]  If 7PM-7AM, please contact night-coverage [email or web address] Password [email or web address] 10/12/2017, 10:40 AM

## 2017-10-12 NOTE — Progress Notes (Signed)
Assumed care for pt at 1330

## 2017-10-12 NOTE — Progress Notes (Signed)
  Dr. Letta Pate currently recommends SNF rehab at current functional level and prior decline in function. I will follow his progress with therapy to assess if endurance and abilities improve. Noted [place] meeting 5/9. [phone]

## 2017-10-12 NOTE — Progress Notes (Signed)
Echo results reviewed.  Echo showed mld LV dysfunction with EF 45-50% with AK of the basal lateral and inferior myocardium with grade 2 diastolic dysfunction.  He has known CAD but has not had any CP per his wife but has had increased DOE. Trop peaked at 1.97 and likely related to acute CVA.  Given acute CVA he is not a candidate for cardiac cath at this time.  Given his debilitated state and marked neurological deficits will continue to follow at a distance and await family decision regarding goals of care and Palliative Care consult.  Continue ASA, high dose statin.  No ACE I due to AKI.  Could consider addition of BB but Neuro currently want permissive HTN in the setting of CVA.

## 2017-10-12 NOTE — Progress Notes (Signed)
  Speech Language Pathology Treatment: Dysphagia  Patient Details Name: Ronald Benitez MRN: [id number] DOB: May 13, 1929 Today's Date: 10/12/2017 Time: 8185-6314 SLP Time Calculation (min) (ACUTE ONLY): 10 min  Assessment / Plan / Recommendation Clinical Impression  Skilled treatment session focused on dysphagia goals. SLP received pt alert in bed and reaching for cup of water in SLP's hand. Initially ice chips were trials with no oral movement and no appearance of swallow initiation but mental fixation and reaching for cup. SLP provided hand over hand support for cup sips. Pt with continued decreased oral movement but he did demonstrate swallow initiation as evidenced by hyoid movement. However, pt with multiple swallows per bolus, grunting respirations and continual ineffective throat clearing. Recommend an instrumental swallow study to effectively evaluate safety of returning to diet. Pt doesn't have any alternative means of nutrition at this time.   Although pt's family was questioned about history of swallow deficits on previous day, they now report difficulty handling his own salvia and have excess phelgm over the last 2 years (~ since Parkinson's dx) as well as some globus sensation when eating drinking (intermittently). All questions answered to family's satisfaction.    HPI HPI: Ronald Benitez a 84 y.o.malewith medical history significant ofDM; CAD s/p PCI; Parkinson's disease; HTN; HLD; depression; COPD; and stage 3 CKD presenting with significant neurologic deficits thought to be related to a stroke.This morning, his wife found that he had fallen in the bathroom. She is unsure when it happened - she found him about 715. She left him about 1AM and he was normal in his recliner. His walker was outside the bathroom when she found him. No urinary or fecal incontinence. He has a scalp bruise. He was his normal self yesterday without difficulty. Ambulates with his walker most of  the time in the house. No aspiration concerns normally although he does have a lot of phlegm. Code stroke - awake, aphasic, trying to communicate but can't; right neglect, right hemiplegia. Acute/early subacute infarction involving the left lateral frontal lobe and mid insula. Additional small focus of acute/early subacute infarction within the left occipitotemporal junction. No associated hemorrhage or mass effect.      SLP Plan  MBS       Recommendations  Diet recommendations: NPO Medication Administration: Via alternative means                General recommendations: Rehab consult Oral Care Recommendations: Oral care QID Follow up Recommendations: Inpatient Rehab SLP Visit Diagnosis: Dysphagia, oropharyngeal phase (R13.12) Plan: MBS       GO                Ronald Benitez 10/12/2017, 10:44 AM

## 2017-10-12 NOTE — Progress Notes (Signed)
Chart reviewed. Family meeting with wife and adult children tomorrow, 5/9 at 8:30am. Thank you.  NO CHARGE  Ihor Dow, FNP-C Palliative Medicine Team  Phone: [phone] Fax: [phone]

## 2017-10-13 DIAGNOSIS — K117 Disturbances of salivary secretion: Secondary | ICD-10-CM

## 2017-10-13 DIAGNOSIS — R1310 Dysphagia, unspecified: Secondary | ICD-10-CM

## 2017-10-13 DIAGNOSIS — R451 Restlessness and agitation: Secondary | ICD-10-CM

## 2017-10-13 DIAGNOSIS — Z515 Encounter for palliative care: Secondary | ICD-10-CM

## 2017-10-13 DIAGNOSIS — G20 Parkinson's disease: Secondary | ICD-10-CM

## 2017-10-13 DIAGNOSIS — Z7189 Other specified counseling: Secondary | ICD-10-CM

## 2017-10-13 LAB — CBC
HCT: 35 % — ABNORMAL LOW (ref 39.0–52.0)
Hemoglobin: 11.1 g/dL — ABNORMAL LOW (ref 13.0–17.0)
MCH: 30 pg (ref 26.0–34.0)
MCHC: 31.7 g/dL (ref 30.0–36.0)
MCV: 94.6 fL (ref 78.0–100.0)
PLATELETS: 205 10*3/uL (ref 150–400)
RBC: 3.7 MIL/uL — ABNORMAL LOW (ref 4.22–5.81)
RDW: 16.5 % — AB (ref 11.5–15.5)
WBC: 8.2 10*3/uL (ref 4.0–10.5)

## 2017-10-13 LAB — BASIC METABOLIC PANEL
Anion gap: 9 (ref 5–15)
BUN: 38 mg/dL — AB (ref 6–20)
CO2: 22 mmol/L (ref 22–32)
Calcium: 9.1 mg/dL (ref 8.9–10.3)
Chloride: 113 mmol/L — ABNORMAL HIGH (ref 101–111)
Creatinine, Ser: 2.3 mg/dL — ABNORMAL HIGH (ref 0.61–1.24)
GFR calc Af Amer: 28 mL/min — ABNORMAL LOW (ref >60)
GFR, EST NON AFRICAN AMERICAN: 24 mL/min — AB (ref >60)
GLUCOSE: 121 mg/dL — AB (ref 65–99)
Potassium: 4 mmol/L (ref 3.5–5.1)
Sodium: 144 mmol/L (ref 135–145)

## 2017-10-13 LAB — GLUCOSE, CAPILLARY
GLUCOSE-CAPILLARY: 140 mg/dL — AB (ref 65–99)
Glucose-Capillary: 103 mg/dL — ABNORMAL HIGH (ref 65–99)
Glucose-Capillary: 90 mg/dL (ref 65–99)
Glucose-Capillary: 85 mg/dL (ref 65–99)
Glucose-Capillary: 87 mg/dL (ref 65–99)

## 2017-10-13 MED ORDER — GLYCOPYRROLATE 0.2 MG/ML IJ SOLN
0.2000 mg | Freq: Four times a day (QID) | INTRAMUSCULAR | Status: DC | PRN
  Administered 2017-10-13 – 2017-10-19 (×2): 0.2 mg via INTRAVENOUS
  Filled 2017-10-13 (×2): qty 1

## 2017-10-13 MED ORDER — ORAL CARE MOUTH RINSE
15.0000 mL | Freq: Two times a day (BID) | OROMUCOSAL | Status: DC
  Administered 2017-10-13 (×2): 15 mL via OROMUCOSAL

## 2017-10-13 MED ORDER — CHLORHEXIDINE GLUCONATE 0.12 % MT SOLN
15.0000 mL | Freq: Two times a day (BID) | OROMUCOSAL | Status: DC
  Administered 2017-10-13 – 2017-10-19 (×10): 15 mL via OROMUCOSAL
  Filled 2017-10-13 (×12): qty 15

## 2017-10-13 MED ORDER — LORAZEPAM 2 MG/ML IJ SOLN
1.0000 mg | Freq: Once | INTRAMUSCULAR | Status: AC
  Administered 2017-10-13: 1 mg via INTRAVENOUS
  Filled 2017-10-13: qty 1

## 2017-10-13 NOTE — Progress Notes (Signed)
Occupational Therapy Treatment Patient Details Name: Ronald Benitez MRN: [id number] DOB: 08-20-1928 Today's Date: 10/13/2017    History of present illness 84 y.o. male with medical history significant of DM; CAD s/p PCI; Parkinson's disease; HTN; HLD; depression; COPD; and stage 3 CKD presenting with significant neurologic deficits Imaging revealed Acute/early subacute infarction involving the left lateral frontal lobe and mid insula. Additional small focus of acute/early subacute infarction within the left occipitotemporal junction.   OT comments  Pt very lethargic today and unable to stay awake despite rolling pt, bathing back side and sternal rubbing.  Pt has palliative care meeting this am. Spoke to MD who stated family was going to think about things for a bit.  Pt with no participation today.  Will continue to follow and update goals and adjust plan as needed.    Follow Up Recommendations  SNF;Supervision/Assistance - 24 hour    Equipment Recommendations  None recommended by OT    Recommendations for Other Services      Precautions / Restrictions Precautions Precautions: Fall Restrictions Weight Bearing Restrictions: No       Mobility Bed Mobility Overal bed mobility: Needs Assistance Bed Mobility: Rolling Rolling: Total assist         General bed mobility comments: Pt rolled side to side for toileting care  Transfers                      Balance                                           ADL either performed or assessed with clinical judgement   ADL Overall ADL's : Needs assistance/impaired Eating/Feeding: Sitting;Bed level;NPO   Grooming: Total assistance   Upper Body Bathing: Total assistance;Sitting   Lower Body Bathing: Total assistance;Sit to/from stand   Upper Body Dressing : Total assistance;Sitting   Lower Body Dressing: Total assistance;Sit to/from stand       Toileting- Water quality scientist and Hygiene: +2 for  physical assistance;Total assistance;Bed level Toileting - Clothing Manipulation Details (indicate cue type and reason): Pt rolled side to side in bed with total assist while nursing cleaned pt.       General ADL Comments: Pt very lethargic and not participating in adls today.     Vision       Perception     Praxis      Cognition Arousal/Alertness: Lethargic Behavior During Therapy: Flat affect Overall Cognitive Status: Impaired/Different from baseline Area of Impairment: Attention;Memory;Problem solving;Awareness;Safety/judgement;Following commands;Orientation                   Current Attention Level: Focused       Awareness: Intellectual Problem Solving: Slow processing;Difficulty sequencing;Requires verbal cues;Requires tactile cues General Comments: PT not following commands today.  Very difficult to arouse and when awake for moments was not following commands.        Exercises     Shoulder Instructions       General Comments Pt too fatigued to participate today. Pt would wake momentarily but then back to sleep. Sternal rub, rolling and sitting pt up did not wake him.    Pertinent Vitals/ Pain       Pain Assessment: No/denies pain Faces Pain Scale: No hurt  Home Living  Prior Functioning/Environment              Frequency  Min 2X/week        Progress Toward Goals  OT Goals(current goals can now be found in the care plan section)  Progress towards OT goals: Not progressing toward goals - comment(pt very lethargic today.  Unable to keep awake)  Acute Rehab OT Goals Patient Stated Goal: unable tos tate OT Goal Formulation: With family Time For Goal Achievement: 20-Nov-2017 Potential to Achieve Goals: Fair ADL Goals Pt Will Perform Grooming: with min assist;sitting Pt Will Perform Upper Body Bathing: with mod assist;sitting Pt Will Perform Upper Body Dressing: with mod  assist;sitting Pt Will Transfer to Toilet: with min assist;ambulating;regular height toilet;bedside commode;grab bars Pt Will Perform Toileting - Clothing Manipulation and hygiene: with mod assist;sit to/from stand Additional ADL Goal #1: Pt will sustain attention to simple ADL tasks for 1 min with min cues Additional ADL Goal #2: Pt will use Rt UE as a support 75% of time during functional tasks  Plan Discharge plan remains appropriate    Co-evaluation                 AM-PAC PT "6 Clicks" Daily Activity     Outcome Measure   Help from another person eating meals?: Total Help from another person taking care of personal grooming?: Total Help from another person toileting, which includes using toliet, bedpan, or urinal?: Total Help from another person bathing (including washing, rinsing, drying)?: Total Help from another person to put on and taking off regular upper body clothing?: Total Help from another person to put on and taking off regular lower body clothing?: Total 6 Click Score: 6    End of Session    OT Visit Diagnosis: Unsteadiness on feet (R26.81);Hemiplegia and hemiparesis;Cognitive communication deficit (R41.841) Symptoms and signs involving cognitive functions: Cerebral infarction Hemiplegia - Right/Left: Right Hemiplegia - dominant/non-dominant: Dominant Hemiplegia - caused by: Cerebral infarction   Activity Tolerance Patient limited by fatigue   Patient Left in bed;with call bell/phone within reach;with bed alarm set;with family/visitor present   Nurse Communication Mobility status        Time: 1093-2355 OT Time Calculation (min): 12 min  Charges: OT General Charges $OT Visit: 1 Visit OT Treatments $Self Care/Home Management : 8-22 mins  Jinger Neighbors, OTR/L [phone]   Glenford Peers 10/13/2017, 11:41 AM

## 2017-10-13 NOTE — Progress Notes (Signed)
PROGRESS NOTE    Ronald Benitez  HGD:[id number] DOB: 17-Sep-1928 DOA: 10/10/2017 PCP: [name], MD   Brief Narrative: 84 yo M with good prior functional status, history of DM, CAD s/p PCI, Parkinson's ds, HTN, HLD, CKD who was brought to the hospital on 5/6 after being found down in the bathroom by his wife around 7 am. He was noted to have right sided hemiplegia and neglect. Neurology was consulted in the ED. Cardiology was consulted as well for positive troponin and ST segment changes on EKG. No reports of chest discomfort prior to this episode.    Assessment & Plan:   Principal Problem:   CVA (cerebral vascular accident) ([place]) Active Problems:   Essential hypertension   Hyperlipidemia   Chronic renal disease, stage III (HCC)   CAD (coronary artery disease)   DM (diabetes mellitus) ([place])   Elevated troponin   Acute ischemic stroke ([place])   Parkinson's disease ([place])   Oropharyngeal dysphagia  1-Acute CVA -MRI on admission showed acute/early subacute infarction involving the left lateral frontal lobe and mid insula. Additional small focus of acute/early subacute infarction within the left occipitotemporal junction. No associated hemorrhage or mass effect. -neurology following.  -ECHO ef lower 45 %, wall motion abnormalities.  -LDL 61 A1-c 8.1 -Aspirin per rectum.  -Fail swallow evaluation. Family unsure about NG tube, Peg tube. Palliative met with family today. Family to decided this afternoon regarding tube feeding.  He is lethargic on my evaluation, received ativan for agitation.   2-PNA; Fever, chest x ray with patchy consolidation left upper lobe and possible right bases. Started IV antibiotics to cover for PNA.  Continue with IV antibiotics.   3-Elevation troponin;  ECHO with wall motion abnormalities.  Cardiology following. Await rec.  Peak to 1.9. Thought to be related to CVA.  No further cardiac evaluation at this time due to recent stroke.   DM; SSI.     CKD stage IV; Cr at 2.3 stable. Continue to monitor.   HTN;  Permissive HTN.   Parkinson's Diseases;  Medications on hold due to NPO status.   DVT prophylaxis: lovenox Code Status: DNR, discussed with wife. Patient wouldn't want to be resuscitated.  Family Communication: no family at bedside  Disposition Plan: to be determine,   Consultants:   Neurology  Cardiology   Palliative.      Procedures:   ECHO;    Antimicrobials:  Unasyn 5-08   Subjective: Global aphasia.  Lethargic this am, received ativan    Objective: Vitals:   10/12/17 2044 10/13/17 0334 10/13/17 0645 10/13/17 0731  BP: (!) 156/80 (!) 165/78 (!) 156/71 125/63  Pulse: 84 83 69 64  Resp: 20 (!) 22 20 16   Temp: 98.1 F (36.7 C) 98.7 F (37.1 C) 97.7 F (36.5 C) 98 F (36.7 C)  TempSrc: Axillary Oral Axillary Oral  SpO2: 99% 97% 99% 95%  Weight:      Height:        Intake/Output Summary (Last 24 hours) at 10/13/2017 1058 Last data filed at 10/13/2017 9622 Gross per 24 hour  Intake 300 ml  Output 750 ml  Net -450 ml   Filed Weights   10/10/17 0906  Weight: 76.8 kg (169 lb 5 oz)    Examination:  General exam: lethargic Respiratory system: Bilateral ronchus Cardiovascular system: S 1, S 2 RRR Gastrointestinal system: BS present, soft, nt Central nervous system: aphasia, lethargic Extremities: no edema Skin: no rash     Data Reviewed: I have personally  reviewed following labs and imaging studies  CBC: Recent Labs  Lab 10/10/17 0821 10/10/17 0834 10/12/17 0534 10/13/17 0653  WBC 9.2  --  8.4 8.2  NEUTROABS 6.9  --   --   --   HGB 11.5* 12.2* 11.2* 11.1*  HCT 35.7* 36.0* 35.1* 35.0*  MCV 95.2  --  95.1 94.6  PLT 215  --  212 710   Basic Metabolic Panel: Recent Labs  Lab 10/10/17 0821 10/10/17 0834 10/12/17 0534 10/13/17 0653  NA 139 142 143 144  K 4.0 4.0 4.1 4.0  CL 106 108 112* 113*  CO2 21*  --  21* 22  GLUCOSE 156* 154* 136* 121*  BUN 46* 40* 41* 38*   CREATININE 2.62* 2.50* 2.35* 2.30*  CALCIUM 9.4  --  9.1 9.1   GFR: Estimated Creatinine Clearance: 24.1 mL/min (A) (by C-G formula based on SCr of 2.3 mg/dL (H)). Liver Function Tests: Recent Labs  Lab 10/10/17 0821  AST 27  ALT 7*  ALKPHOS 73  BILITOT 1.3*  PROT 7.0  ALBUMIN 3.5   No results for input(s): LIPASE, AMYLASE in the last 168 hours. No results for input(s): AMMONIA in the last 168 hours. Coagulation Profile: Recent Labs  Lab 10/10/17 0821  INR 1.10   Cardiac Enzymes: Recent Labs  Lab 10/10/17 1804 10/11/17 0019 10/11/17 0621 10/11/17 1214 10/11/17 1733  TROPONINI 0.59* 1.34* 1.71* 1.97* 1.79*   BNP (last 3 results) No results for input(s): PROBNP in the last 8760 hours. HbA1C: Recent Labs    10/10/17 1315  HGBA1C 8.1*   CBG: Recent Labs  Lab 10/12/17 2039 10/12/17 2136 10/12/17 2317 10/13/17 0331 10/13/17 0756  GLUCAP 108* 120* 115* 140* 103*   Lipid Profile: Recent Labs    10/11/17 0621  CHOL 120  HDL 44  LDLCALC 61  TRIG 76  CHOLHDL 2.7   Thyroid Function Tests: Recent Labs    10/10/17 1315  TSH 3.338   Anemia Panel: No results for input(s): VITAMINB12, FOLATE, FERRITIN, TIBC, IRON, RETICCTPCT in the last 72 hours. Sepsis Labs: No results for input(s): PROCALCITON, LATICACIDVEN in the last 168 hours.  No results found for this or any previous visit (from the past 240 hour(s)).       Radiology Studies: Dg Swallowing Func-speech Pathology  Result Date: 10/12/2017 Objective Swallowing Evaluation: Type of Study: MBS-Modified Barium Swallow Study  Patient Details Name: Ronald Benitez MRN: [id number] Date of Birth: 10/13/1928 Today's Date: 10/12/2017 Time: SLP Start Time (ACUTE ONLY): 1105 -SLP Stop Time (ACUTE ONLY): 1130 SLP Time Calculation (min) (ACUTE ONLY): 25 min Past Medical History: Past Medical History: Diagnosis Date . Arthritis  . BPH (benign prostatic hyperplasia)  . Bruises easily  . CAD (coronary artery  disease) cardiologist-  dr berry  a. 01/16/14 s/p DES x2 to RCA. b remote RCA stenting 03/ 1997; 04/ 1997 stenting OM2;  11/ 1997  stenting pCFX, OM2 (restenosed), angioplasty OM1  . CKD (chronic kidney disease), stage III ([place])  . COPD mixed type [facility]) pulmologist-  dr byrum  mild obstructive and restrictive . Depression  . Diverticulosis of colon  . Dyspnea  . First degree heart block  . GERD (gastroesophageal reflux disease)  . Hearing loss of both ears  . Hiatal hernia  . History of adenomatous polyp of colon   last colonoscopy 12-06-2016 tubular adenoma w/ high grade hyperplasia (cecal polyp),  tubular adenoma not high grade dysplasia (bx),  tubulovillious adenoma's not high grade dysplasia (sigmoid polyp x2) .  History of coronary artery stent placement   03/ 1997;  04/ 1997;  11/ 1997  (unknown type); DES 01/15/17 . History of non-ST elevation myocardial infarction (NSTEMI) 03/01/2000 . HTN (hypertension)  . Hyperlipidemia  . Idiopathic Parkinson's disease [facility])   neurologist-  dr tat . Right bundle branch block (RBBB) with left anterior fascicular block (LAFB)  . Sebaceous cyst   chronic of buttock-- per pt open area size tennis ball . Speech impediment  . Type 2 diabetes mellitus ([place])  . Unsteady gait  . Wears glasses  Past Surgical History: Past Surgical History: Procedure Laterality Date . CARDIAC CATHETERIZATION  02-10-2000  dr Daneen Schick  high-grade functional total occludion of PDA, high-grade obstruction in branch of the acute margainal branch of RCA,  total occlusion of branch of the D1 with high-grade obstruction in the second branch of D1; mLAD 50%,  third obtuse marginal branch 70-80%; all of these sites of stenosis in the branch vessels are not signifinantly changed from cardiac cath. after PCI 1997,except acute marginal br  . CARDIAC CATHETERIZATION  05-10-2005  dr Tamala Julian  severe CAD w/ total occlusion D1, high-grade obstruction in acute marginal of RCA and PDA, high-grade ostruction in more  superior branch of OM1 w/ wide patency of second larger branch of OM1;  continuation severe diffuse disease CFX , has very limited distribution, LAD main portion patent as is prox., mid, & distal RCA; ef 50% w/ mild anterolateral hypokinesis  . CARDIOVASCULAR STRESS TEST  12-12-2013  dr berry  Intermediate nuclear study w/ significant reversible lateral , apical lateral defect which may represent diagonal vessel ischemia/ normal LV wall motion,  lateral hypokinesis w/ ef 48% . CATARACT EXTRACTION W/ INTRAOCULAR LENS IMPLANT Bilateral 2009 approx. . COLONOSCOPY WITH PROPOFOL N/A 12/06/2016  Procedure: COLONOSCOPY WITH PROPOFOL;  Surgeon: Wilford Corner, MD;  Location: [facility];  Service: Endoscopy;  Laterality: N/A; . CORONARY ANGIOPLASTY WITH STENT PLACEMENT  08/1995  dr Daneen Schick  stenting to RCA . CORONARY ANGIOPLASTY WITH STENT PLACEMENT  04/ 1997  dr Daneen Schick  stenting to [place] WITH STENT PLACEMENT  11/ 1997  dr Daneen Schick  stenting to proxCFX, OM2 (restenosed), PTCA to OM1 . ESOPHAGOGASTRODUODENOSCOPY ENDOSCOPY   . LEFT HEART CATHETERIZATION WITH CORONARY ANGIOGRAM N/A 01/15/2014  Procedure: LEFT HEART CATHETERIZATION WITH CORONARY ANGIOGRAM;  Surgeon: Lorretta Harp, MD;  Location: [facility] CATH LAB;  Service: Cardiovascular;  Laterality: N/A; occluded moderate size diagonal branch which fills by collaterals faintly;  patent mid AV groove CFX stent, patent mRCA stent w/ high-grade tandem stenosis's in the mid and distal portion . MASS EXCISION Left 12/15/2016  Procedure: REMOVAL OF LEFT GLUTEAL SUBCUTANEOUS MASS;  Surgeon: Michael Boston, MD;  Location: [facility];  Service: General;  Laterality: Left; . OPEN INGUINAL HERNIA REPAIR W/ MESH Right 01/ 24/ 2012   dr Dalbert Batman . PERCUTANEOUS CORONARY ROTOBLATOR INTERVENTION (PCI-R) N/A 01/16/2014  Procedure: PERCUTANEOUS CORONARY ROTOBLATOR INTERVENTION (PCI-R);  Surgeon: Troy Sine, MD;  Location: [facility] CATH LAB;  Service: Cardiovascular;   Laterality: N/A;  PTCI to RCA and DES x2 to mid and distal RCA . TONSILLECTOMY  child . TRANSTHORACIC ECHOCARDIOGRAM  12-06-2013   dr berry  mild focal basal hypertrophy of the septum,  ef 50-55%,  grade 3 diastolic dysfunction/  mild AR/ mild to moderate MR/  increased thickness of the atrial septum consistant with lipomatous hypertrophy and very small PFO (agitated saline contrast study showed small right-to-left atrial level shunt/  trivial PR/  mild RAE  HPI: AJAY STRUBEL a 84 y.o.malewith medical history significant ofDM; CAD s/p PCI; Parkinson's disease; HTN; HLD; depression; COPD; and stage 3 CKD presenting with significant neurologic deficits thought to be related to a stroke.This morning, his wife found that he had fallen in the bathroom. She is unsure when it happened - she found him about 715. She left him about 1AM and he was normal in his recliner. His walker was outside the bathroom when she found him. No urinary or fecal incontinence. He has a scalp bruise. He was his normal self yesterday without difficulty. Ambulates with his walker most of the time in the house. No aspiration concerns normally although he does have a lot of phlegm. Code stroke - awake, aphasic, trying to communicate but can't; right neglect, right hemiplegia. Acute/early subacute infarction involving the left lateral frontal lobe and mid insula. Additional small focus of acute/early subacute infarction within the left occipitotemporal junction. No associated hemorrhage or mass effect.  Subjective: alert, nonverbal Assessment / Plan / Recommendation CHL IP CLINICAL IMPRESSIONS 10/12/2017 Clinical Impression Pt presents with mod-severe oropharyngeal dysphagia marked by poor oral attention, reduced labial seal with anterior spillage; oral holding of POs with limited effort to propel to pharynx.  There was passive spilling of POs into pharynx, with reduced hyolaryngeal mobility/poor epiglottic closure, leading to  immediate aspiration of thin liquids.  Cough response did not eject penetrate/aspirate. Nectar liquids penetrated the larynx; purees rested in pharynx and oral cavity.  Study was discontinued at this time and oral residue was removed via suctioning.  Pt is a high aspiration risk at this time.  Video and nature of dysphagia was viewed and discussed with pt's wife and daughter.  Results D/W Dr. Tyrell Antonio.  Palliative medicine consult is potentially pending.  Recommend NPO for now.  SLP Visit Diagnosis Dysphagia, oropharyngeal phase (R13.12) Attention and concentration deficit following -- Frontal lobe and executive function deficit following -- Impact on safety and function Risk for inadequate nutrition/hydration;Severe aspiration risk   CHL IP TREATMENT RECOMMENDATION 10/12/2017 Treatment Recommendations Therapy as outlined in treatment plan below   Prognosis 10/11/2017 Prognosis for Safe Diet Advancement Fair Barriers to Reach Goals Cognitive deficits;Severity of deficits Barriers/Prognosis Comment -- CHL IP DIET RECOMMENDATION 10/12/2017 SLP Diet Recommendations NPO Liquid Administration via -- Medication Administration -- Compensations -- Postural Changes --   No flowsheet data found.  CHL IP FOLLOW UP RECOMMENDATIONS 10/12/2017 Follow up Recommendations Inpatient Rehab   CHL IP FREQUENCY AND DURATION 10/11/2017 Speech Therapy Frequency (ACUTE ONLY) min 2x/week Treatment Duration --      CHL IP ORAL PHASE 10/12/2017 Oral Phase Impaired Oral - Pudding Teaspoon -- Oral - Pudding Cup -- Oral - Honey Teaspoon -- Oral - Honey Cup -- Oral - Nectar Teaspoon Left anterior bolus loss;Right anterior bolus loss;Weak lingual manipulation;Incomplete tongue to palate contact;Holding of bolus;Reduced posterior propulsion;Delayed oral transit;Decreased bolus cohesion Oral - Nectar Cup -- Oral - Nectar Straw -- Oral - Thin Teaspoon Left anterior bolus loss;Right anterior bolus loss;Weak lingual manipulation;Incomplete tongue to palate  contact;Holding of bolus;Reduced posterior propulsion;Delayed oral transit;Decreased bolus cohesion Oral - Thin Cup -- Oral - Thin Straw -- Oral - Puree Weak lingual manipulation;Incomplete tongue to palate contact;Holding of bolus;Reduced posterior propulsion;Delayed oral transit;Decreased bolus cohesion Oral - Mech Soft -- Oral - Regular -- Oral - Multi-Consistency -- Oral - Pill -- Oral Phase - Comment --  CHL IP PHARYNGEAL PHASE 10/12/2017 Pharyngeal Phase Impaired Pharyngeal- Pudding Teaspoon -- Pharyngeal -- Pharyngeal- Pudding Cup --  Pharyngeal -- Pharyngeal- Honey Teaspoon -- Pharyngeal -- Pharyngeal- Honey Cup -- Pharyngeal -- Pharyngeal- Nectar Teaspoon Delayed swallow initiation-pyriform sinuses;Reduced pharyngeal peristalsis;Reduced epiglottic inversion;Reduced anterior laryngeal mobility;Reduced laryngeal elevation;Reduced airway/laryngeal closure;Reduced tongue base retraction;Penetration/Aspiration during swallow;Pharyngeal residue - valleculae Pharyngeal Material enters airway, CONTACTS cords and not ejected out Pharyngeal- Nectar Cup -- Pharyngeal -- Pharyngeal- Nectar Straw -- Pharyngeal -- Pharyngeal- Thin Teaspoon Delayed swallow initiation-pyriform sinuses;Reduced pharyngeal peristalsis;Reduced epiglottic inversion;Reduced anterior laryngeal mobility;Reduced laryngeal elevation;Reduced airway/laryngeal closure;Reduced tongue base retraction;Pharyngeal residue - valleculae;Penetration/Aspiration before swallow Pharyngeal Material enters airway, passes BELOW cords without attempt by patient to eject out (silent aspiration);Material enters airway, passes BELOW cords and not ejected out despite cough attempt by patient Pharyngeal- Thin Cup -- Pharyngeal -- Pharyngeal- Thin Straw -- Pharyngeal -- Pharyngeal- Puree Delayed swallow initiation-pyriform sinuses;Reduced pharyngeal peristalsis;Reduced epiglottic inversion;Reduced anterior laryngeal mobility;Reduced laryngeal elevation;Reduced tongue base  retraction;Pharyngeal residue - valleculae Pharyngeal Material does not enter airway Pharyngeal- Mechanical Soft -- Pharyngeal -- Pharyngeal- Regular -- Pharyngeal -- Pharyngeal- Multi-consistency -- Pharyngeal -- Pharyngeal- Pill -- Pharyngeal -- Pharyngeal Comment --  No flowsheet data found. CHL IP GO 09/16/2016 Functional Assessment Tool Used asha noms, clinical judgment,  Functional Limitations Other Speech Language Pathology Swallow Current Status [phone]) (None) Swallow Goal Status (Z3299) (None) Swallow Discharge Status [phone]) (None) Motor Speech Current Status [phone]) (None) Motor Speech Goal Status [phone]) (None) Motor Speech Goal Status [phone]) (None) Spoken Language Comprehension Current Status [phone]) (None) Spoken Language Comprehension Goal Status (E0814) (None) Spoken Language Comprehension Discharge Status [phone]) (None) Spoken Language Expression Current Status [phone]) (None) Spoken Language Expression Goal Status [phone]) (None) Spoken Language Expression Discharge Status [phone]) (None) Attention Current Status (F0277) (None) Attention Goal Status (A1287) (None) Attention Discharge Status [phone]) (None) Memory Current Status (M0947) (None) Memory Goal Status (S9628) (None) Memory Discharge Status (Z6629) (None) Voice Current Status (U7654) (None) Voice Goal Status (Y5035) (None) Voice Discharge Status [phone]) (None) Other Speech-Language Pathology Functional Limitation Current Status [phone]) (None) Other Speech-Language Pathology Functional Limitation Goal Status (Z0017) CI Other Speech-Language Pathology Functional Limitation Discharge Status [phone]) CI Juan Quam Laurice 10/12/2017, 1:49 PM                   Scheduled Meds: . aspirin  300 mg Rectal Daily   Or  . aspirin  325 mg Oral Daily  . atorvastatin  80 mg Oral q1800  . chlorhexidine  15 mL Mouth Rinse BID  . enoxaparin (LOVENOX) injection  30 mg Subcutaneous Q24H  . insulin aspart  0-9 Units Subcutaneous Q4H  . mouth rinse   15 mL Mouth Rinse q12n4p   Continuous Infusions: . ampicillin-sulbactam (UNASYN) IV Stopped (10/13/17 0049)     LOS: 3 days    Time spent: 35 minutes.     Elmarie Shiley, MD [facility] Pager [phone]  If 7PM-7AM, please contact night-coverage [email or web address] Password [email or web address] 10/13/2017, 10:58 AM

## 2017-10-13 NOTE — Care Management Note (Deleted)
Case Management Note  Patient Details  Name: Ronald Benitez MRN: [id number] Date of Birth: 01/06/1929  Subjective/Objective:                    Action/Plan:   Expected Discharge Date:                  Expected Discharge Plan:     In-House Referral:  Clinical Social Work, Hospice / Palliative Care  Discharge planning Services     Post Acute Care Choice:    Choice offered to:     DME Arranged:    DME Agency:     HH Arranged:    HH Agency:     Status of Service:     If discussed at [employer], dates discussed:    Additional Comments:  Kristen Cardinal, RN 10/13/2017, 10:03 AM

## 2017-10-13 NOTE — Progress Notes (Addendum)
STROKE TEAM PROGRESS NOTE   INTERVAL HISTORY Patient agitated this morning, received sedation and now resting.  Family met with palliative care this morning.  Wife is still considering her options.  She is currently gone home to take a shower.  Children are supportive what ever her decision is.  Currently, she is leaning against tube feeding.   Vitals:   10/12/17 2044 10/13/17 0334 10/13/17 0645 10/13/17 0731  BP: (!) 156/80 (!) 165/78 (!) 156/71 125/63  Pulse: 84 83 69 64  Resp: 20 (!) 22 20 16   Temp: 98.1 F (36.7 C) 98.7 F (37.1 C) 97.7 F (36.5 C) 98 F (36.7 C)  TempSrc: Axillary Oral Axillary Oral  SpO2: 99% 97% 99% 95%  Weight:      Height:        CBC:  CBC Latest Ref Rng & Units 10/13/2017 10/12/2017 10/10/2017  WBC 4.0 - 10.5 K/uL 8.2 8.4 -  Hemoglobin 13.0 - 17.0 g/dL 11.1(L) 11.2(L) 12.2(L)  Hematocrit 39.0 - 52.0 % 35.0(L) 35.1(L) 36.0(L)  Platelets 150 - 400 K/uL 205 212 -     Comprehensive Metabolic Panel:   CMP Latest Ref Rng & Units 10/13/2017 10/12/2017 10/10/2017  Glucose 65 - 99 mg/dL 121(H) 136(H) 154(H)  BUN 6 - 20 mg/dL 38(H) 41(H) 40(H)  Creatinine 0.61 - 1.24 mg/dL 2.30(H) 2.35(H) 2.50(H)  Sodium 135 - 145 mmol/L 144 143 142  Potassium 3.5 - 5.1 mmol/L 4.0 4.1 4.0  Chloride 101 - 111 mmol/L 113(H) 112(H) 108  CO2 22 - 32 mmol/L 22 21(L) -  Calcium 8.9 - 10.3 mg/dL 9.1 9.1 -  Total Protein 6.5 - 8.1 g/dL - - -  Total Bilirubin 0.3 - 1.2 mg/dL - - -  Alkaline Phos 38 - 126 U/L - - -  AST 15 - 41 U/L - - -  ALT 17 - 63 U/L - - -   Lipid Panel:     Component Value Date/Time   CHOL 120 10/11/2017 0621   TRIG 76 10/11/2017 0621   HDL 44 10/11/2017 0621   CHOLHDL 2.7 10/11/2017 0621   VLDL 15 10/11/2017 0621   LDLCALC 61 10/11/2017 0621   HgbA1c:  Lab Results  Component Value Date   HGBA1C 8.1 (H) 10/10/2017   Urine Drug Screen:     Component Value Date/Time   LABOPIA NONE DETECTED 10/10/2017 0821   COCAINSCRNUR NONE DETECTED 10/10/2017 0821    LABBENZ NONE DETECTED 10/10/2017 0821   AMPHETMU NONE DETECTED 10/10/2017 0821   THCU NONE DETECTED 10/10/2017 0821   LABBARB NONE DETECTED 10/10/2017 0821    Alcohol Level     Component Value Date/Time   ETH <10 10/10/2017 1030    IMAGING Dg Chest Port 1 View  Result Date: 10/11/2017 CLINICAL DATA:  84 year old male with fever.  Initial encounter. EXAM: PORTABLE CHEST 1 VIEW COMPARISON:  07/23/2015 chest x-ray. FINDINGS: Pulmonary vascular congestion/pulmonary edema. Superimposed patchy consolidation left upper lobe and possibly right lung base may represent infectious process given patient's history of fever. Heart size top-normal. Calcified aorta. IMPRESSION: Pulmonary vascular congestion/pulmonary edema. Superimposed patchy consolidation left upper lobe and possibly right lung base may represent infectious process given patient's history of fever. Follow-up until clearance recommended to exclude underlying mass. Aortic Atherosclerosis (ICD10-I70.0). Electronically Signed   By: Genia Del M.D.   On: 10/11/2017 11:15   Dg Swallowing Func-speech Pathology  Result Date: 10/12/2017 Objective Swallowing Evaluation: Type of Study: MBS-Modified Barium Swallow Study  Patient Details Name: KAVIAN PETERS  MRN: [id number] Date of Birth: Dec 28, 1928 Today's Date: 10/12/2017 Time: SLP Start Time (ACUTE ONLY): 1105 -SLP Stop Time (ACUTE ONLY): 1130 SLP Time Calculation (min) (ACUTE ONLY): 25 min Past Medical History: Past Medical History: Diagnosis Date . Arthritis  . BPH (benign prostatic hyperplasia)  . Bruises easily  . CAD (coronary artery disease) cardiologist-  dr berry  a. 01/16/14 s/p DES x2 to RCA. b remote RCA stenting 03/ 1997; 04/ 1997 stenting OM2;  11/ 1997  stenting pCFX, OM2 (restenosed), angioplasty OM1  . CKD (chronic kidney disease), stage III ([place])  . COPD mixed type [facility]) pulmologist-  dr byrum  mild obstructive and restrictive . Depression  . Diverticulosis of colon  . Dyspnea  . First  degree heart block  . GERD (gastroesophageal reflux disease)  . Hearing loss of both ears  . Hiatal hernia  . History of adenomatous polyp of colon   last colonoscopy 12-06-2016 tubular adenoma w/ high grade hyperplasia (cecal polyp),  tubular adenoma not high grade dysplasia (bx),  tubulovillious adenoma's not high grade dysplasia (sigmoid polyp x2) . History of coronary artery stent placement   03/ 1997;  04/ 1997;  11/ 1997  (unknown type); DES 01/15/17 . History of non-ST elevation myocardial infarction (NSTEMI) 03/01/2000 . HTN (hypertension)  . Hyperlipidemia  . Idiopathic Parkinson's disease [facility])   neurologist-  dr tat . Right bundle branch block (RBBB) with left anterior fascicular block (LAFB)  . Sebaceous cyst   chronic of buttock-- per pt open area size tennis ball . Speech impediment  . Type 2 diabetes mellitus ([place])  . Unsteady gait  . Wears glasses  Past Surgical History: Past Surgical History: Procedure Laterality Date . CARDIAC CATHETERIZATION  02-10-2000  dr Daneen Schick  high-grade functional total occludion of PDA, high-grade obstruction in branch of the acute margainal branch of RCA,  total occlusion of branch of the D1 with high-grade obstruction in the second branch of D1; mLAD 50%,  third obtuse marginal branch 70-80%; all of these sites of stenosis in the branch vessels are not signifinantly changed from cardiac cath. after PCI 1997,except acute marginal br  . CARDIAC CATHETERIZATION  05-10-2005  dr Tamala Julian  severe CAD w/ total occlusion D1, high-grade obstruction in acute marginal of RCA and PDA, high-grade ostruction in more superior branch of OM1 w/ wide patency of second larger branch of OM1;  continuation severe diffuse disease CFX , has very limited distribution, LAD main portion patent as is prox., mid, & distal RCA; ef 50% w/ mild anterolateral hypokinesis  . CARDIOVASCULAR STRESS TEST  12-12-2013  dr berry  Intermediate nuclear study w/ significant reversible lateral , apical lateral  defect which may represent diagonal vessel ischemia/ normal LV wall motion,  lateral hypokinesis w/ ef 48% . CATARACT EXTRACTION W/ INTRAOCULAR LENS IMPLANT Bilateral 2009 approx. . COLONOSCOPY WITH PROPOFOL N/A 12/06/2016  Procedure: COLONOSCOPY WITH PROPOFOL;  Surgeon: Wilford Corner, MD;  Location: [place];  Service: Endoscopy;  Laterality: N/A; . CORONARY ANGIOPLASTY WITH STENT PLACEMENT  08/1995  dr Daneen Schick  stenting to RCA . CORONARY ANGIOPLASTY WITH STENT PLACEMENT  04/ 1997  dr Daneen Schick  stenting to [place] WITH STENT PLACEMENT  11/ 1997  dr Daneen Schick  stenting to proxCFX, OM2 (restenosed), PTCA to OM1 . ESOPHAGOGASTRODUODENOSCOPY ENDOSCOPY   . LEFT HEART CATHETERIZATION WITH CORONARY ANGIOGRAM N/A 01/15/2014  Procedure: LEFT HEART CATHETERIZATION WITH CORONARY ANGIOGRAM;  Surgeon: Lorretta Harp, MD;  Location: [facility] CATH LAB;  Service:  Cardiovascular;  Laterality: N/A; occluded moderate size diagonal branch which fills by collaterals faintly;  patent mid AV groove CFX stent, patent mRCA stent w/ high-grade tandem stenosis's in the mid and distal portion . MASS EXCISION Left 12/15/2016  Procedure: REMOVAL OF LEFT GLUTEAL SUBCUTANEOUS MASS;  Surgeon: Michael Boston, MD;  Location: [facility];  Service: General;  Laterality: Left; . OPEN INGUINAL HERNIA REPAIR W/ MESH Right 01/ 24/ 2012   dr Dalbert Batman . PERCUTANEOUS CORONARY ROTOBLATOR INTERVENTION (PCI-R) N/A 01/16/2014  Procedure: PERCUTANEOUS CORONARY ROTOBLATOR INTERVENTION (PCI-R);  Surgeon: Troy Sine, MD;  Location: [facility] CATH LAB;  Service: Cardiovascular;  Laterality: N/A;  PTCI to RCA and DES x2 to mid and distal RCA . TONSILLECTOMY  child . TRANSTHORACIC ECHOCARDIOGRAM  12-06-2013   dr berry  mild focal basal hypertrophy of the septum,  ef 50-55%,  grade 3 diastolic dysfunction/  mild AR/ mild to moderate MR/  increased thickness of the atrial septum consistant with lipomatous hypertrophy and very small PFO (agitated saline  contrast study showed small right-to-left atrial level shunt/  trivial PR/  mild RAE  HPI: KEATH MATERA a 84 y.o.malewith medical history significant ofDM; CAD s/p PCI; Parkinson's disease; HTN; HLD; depression; COPD; and stage 3 CKD presenting with significant neurologic deficits thought to be related to a stroke.This morning, his wife found that he had fallen in the bathroom. She is unsure when it happened - she found him about 715. She left him about 1AM and he was normal in his recliner. His walker was outside the bathroom when she found him. No urinary or fecal incontinence. He has a scalp bruise. He was his normal self yesterday without difficulty. Ambulates with his walker most of the time in the house. No aspiration concerns normally although he does have a lot of phlegm. Code stroke - awake, aphasic, trying to communicate but can't; right neglect, right hemiplegia. Acute/early subacute infarction involving the left lateral frontal lobe and mid insula. Additional small focus of acute/early subacute infarction within the left occipitotemporal junction. No associated hemorrhage or mass effect.  Subjective: alert, nonverbal Assessment / Plan / Recommendation CHL IP CLINICAL IMPRESSIONS 10/12/2017 Clinical Impression Pt presents with mod-severe oropharyngeal dysphagia marked by poor oral attention, reduced labial seal with anterior spillage; oral holding of POs with limited effort to propel to pharynx.  There was passive spilling of POs into pharynx, with reduced hyolaryngeal mobility/poor epiglottic closure, leading to immediate aspiration of thin liquids.  Cough response did not eject penetrate/aspirate. Nectar liquids penetrated the larynx; purees rested in pharynx and oral cavity.  Study was discontinued at this time and oral residue was removed via suctioning.  Pt is a high aspiration risk at this time.  Video and nature of dysphagia was viewed and discussed with pt's wife and  daughter.  Results D/W Dr. Tyrell Antonio.  Palliative medicine consult is potentially pending.  Recommend NPO for now.  SLP Visit Diagnosis Dysphagia, oropharyngeal phase (R13.12) Attention and concentration deficit following -- Frontal lobe and executive function deficit following -- Impact on safety and function Risk for inadequate nutrition/hydration;Severe aspiration risk   CHL IP TREATMENT RECOMMENDATION 10/12/2017 Treatment Recommendations Therapy as outlined in treatment plan below   Prognosis 10/11/2017 Prognosis for Safe Diet Advancement Fair Barriers to Reach Goals Cognitive deficits;Severity of deficits Barriers/Prognosis Comment -- CHL IP DIET RECOMMENDATION 10/12/2017 SLP Diet Recommendations NPO Liquid Administration via -- Medication Administration -- Compensations -- Postural Changes --   No flowsheet data found.  CHL IP FOLLOW UP  RECOMMENDATIONS 10/12/2017 Follow up Recommendations Inpatient Rehab   CHL IP FREQUENCY AND DURATION 10/11/2017 Speech Therapy Frequency (ACUTE ONLY) min 2x/week Treatment Duration --      CHL IP ORAL PHASE 10/12/2017 Oral Phase Impaired Oral - Pudding Teaspoon -- Oral - Pudding Cup -- Oral - Honey Teaspoon -- Oral - Honey Cup -- Oral - Nectar Teaspoon Left anterior bolus loss;Right anterior bolus loss;Weak lingual manipulation;Incomplete tongue to palate contact;Holding of bolus;Reduced posterior propulsion;Delayed oral transit;Decreased bolus cohesion Oral - Nectar Cup -- Oral - Nectar Straw -- Oral - Thin Teaspoon Left anterior bolus loss;Right anterior bolus loss;Weak lingual manipulation;Incomplete tongue to palate contact;Holding of bolus;Reduced posterior propulsion;Delayed oral transit;Decreased bolus cohesion Oral - Thin Cup -- Oral - Thin Straw -- Oral - Puree Weak lingual manipulation;Incomplete tongue to palate contact;Holding of bolus;Reduced posterior propulsion;Delayed oral transit;Decreased bolus cohesion Oral - Mech Soft -- Oral - Regular -- Oral - Multi-Consistency -- Oral  - Pill -- Oral Phase - Comment --  CHL IP PHARYNGEAL PHASE 10/12/2017 Pharyngeal Phase Impaired Pharyngeal- Pudding Teaspoon -- Pharyngeal -- Pharyngeal- Pudding Cup -- Pharyngeal -- Pharyngeal- Honey Teaspoon -- Pharyngeal -- Pharyngeal- Honey Cup -- Pharyngeal -- Pharyngeal- Nectar Teaspoon Delayed swallow initiation-pyriform sinuses;Reduced pharyngeal peristalsis;Reduced epiglottic inversion;Reduced anterior laryngeal mobility;Reduced laryngeal elevation;Reduced airway/laryngeal closure;Reduced tongue base retraction;Penetration/Aspiration during swallow;Pharyngeal residue - valleculae Pharyngeal Material enters airway, CONTACTS cords and not ejected out Pharyngeal- Nectar Cup -- Pharyngeal -- Pharyngeal- Nectar Straw -- Pharyngeal -- Pharyngeal- Thin Teaspoon Delayed swallow initiation-pyriform sinuses;Reduced pharyngeal peristalsis;Reduced epiglottic inversion;Reduced anterior laryngeal mobility;Reduced laryngeal elevation;Reduced airway/laryngeal closure;Reduced tongue base retraction;Pharyngeal residue - valleculae;Penetration/Aspiration before swallow Pharyngeal Material enters airway, passes BELOW cords without attempt by patient to eject out (silent aspiration);Material enters airway, passes BELOW cords and not ejected out despite cough attempt by patient Pharyngeal- Thin Cup -- Pharyngeal -- Pharyngeal- Thin Straw -- Pharyngeal -- Pharyngeal- Puree Delayed swallow initiation-pyriform sinuses;Reduced pharyngeal peristalsis;Reduced epiglottic inversion;Reduced anterior laryngeal mobility;Reduced laryngeal elevation;Reduced tongue base retraction;Pharyngeal residue - valleculae Pharyngeal Material does not enter airway Pharyngeal- Mechanical Soft -- Pharyngeal -- Pharyngeal- Regular -- Pharyngeal -- Pharyngeal- Multi-consistency -- Pharyngeal -- Pharyngeal- Pill -- Pharyngeal -- Pharyngeal Comment --  No flowsheet data found. CHL IP GO 09/16/2016 Functional Assessment Tool Used asha noms, clinical judgment,   Functional Limitations Other Speech Language Pathology Swallow Current Status [phone]) (None) Swallow Goal Status (F1638) (None) Swallow Discharge Status [phone]) (None) Motor Speech Current Status [phone]) (None) Motor Speech Goal Status [phone]) (None) Motor Speech Goal Status [phone]) (None) Spoken Language Comprehension Current Status [phone]) (None) Spoken Language Comprehension Goal Status (Q7622) (None) Spoken Language Comprehension Discharge Status [phone]) (None) Spoken Language Expression Current Status [phone]) (None) Spoken Language Expression Goal Status [phone]) (None) Spoken Language Expression Discharge Status [phone]) (None) Attention Current Status (J6811) (None) Attention Goal Status (X7262) (None) Attention Discharge Status [phone]) (None) Memory Current Status (R4163) (None) Memory Goal Status (A4536) (None) Memory Discharge Status (I6803) (None) Voice Current Status (O1224) (None) Voice Goal Status (M2500) (None) Voice Discharge Status (B7048) (None) Other Speech-Language Pathology Functional Limitation Current Status (G8916) (None) Other Speech-Language Pathology Functional Limitation Goal Status (X4503) CI Other Speech-Language Pathology Functional Limitation Discharge Status [phone]) CI Juan Quam Laurice 10/12/2017, 1:49 PM              2D echocardiogram - Left ventricle: The cavity size was normal. Wall thickness was increased in a pattern of severe LVH. Systolic function was mildly reduced. The estimated ejection fraction was in the range of 45% to 50%. There is akinesis  of the basallateral and inferior myocardium. Features are consistent with a pseudonormal left ventricular filling pattern, with concomitant abnormal relaxation and increased filling pressure (grade 2 diastolic dysfunction). - Aortic valve: There was trivial regurgitation. - Mitral valve: Calcified annulus. Impressions:   Small area of akinesis in the basal inferior and lateral walls with overall mildly reduced LV systolic  function; severe LVH; moderate diastolic dysfunction; trace AI.  Lower Extremity Doppler Negative for deep and superficial vein thrombosis.   PHYSICAL EXAM HEENT: [place]/AT Lungs: mouth breathing. Breath sounds cleat to auscultation.  Cardiac: HRR Ext: Noncyanotic  Neurological Exam: Mental Status: asleep. Unable to awaken d/t recent sedation. Cranial Nerves: II:  pupils pinpoint, sluggish. Does not blink to threat on the R, blinks on the L. left pupil irregular.   III,IV, VI: No ptosis. Doll's eyes V,VII: right facial weakness IX,X: Does not verbalize XI: Head preferentially rotated to left XII: Does not protrude tongue to command Motor/Sensory: RUE: Mild rigidity without movement. No response to pain.  RLE: No response to pain.  LUE: No response to pain.  LLE: No response to pain.  Plantars: Mute bilaterally  Cerebellar/Gait: deferred  ASSESSMENT/PLAN [name] is a 84 y.o. male with history of hypertension, hyperlipidemia, coronary artery disease, chronic kidney disease, diabetes, Parkinson's, first-degree heart block and known PFO found down in the bathroom presenting with right hemiparesis and global aphasia.   Stroke:   Left lateral frontal/mid insular infarct and small occipital infarct, appears embolic secondary to unknown source   Code Stroke CT head age indeterminate patchy hypodensity right central cerebellum.  ASPECTS 10.     CTA head & neck L MCA M2 and M3 branch occlusions. L ICA 60% stenosis.  LVA mild to moderate stenosis.  R VA appears moderate to severe origin stenosis.  RV for occlusion.  Aortic atherosclerosis.  Emphysema.  CT perfusion L MCA infarct of 19 mL with only 14 mL penumbra  MRI L lateral frontal and mid insular infarct.  Small left occipital infarct.  2D Echo  EF 45-50%. No source of embolus.  Grade 2 diastolic dysfunction  Known PFO   LE doppler negative   Consider loop recorder prior to d/c to look for atrial fibrillation as  source of stroke (Seen by cardiology.  EKG done by EMS does not show atrial fibrillation or flutter.  No evidence of other atrial arrhythmias other than PAC.)  LDL 61  HgbA1c 8.1  Lovenox 30 mg sq daily for VTE prophylaxis  NPO. ST following. Failed MBSS today. Discussed panda vs PEG vs comfort with wife and daughter. Will hold on panda/TF today until she talks to family.   aspirin 81 mg daily and clopidogrel 75 mg daily prior to admission, now on aspirin 300 mg suppository daily.   palliative care following. Wife deciding on tube feedings/plan of care  Therapy recommendations:  CIR. Consult placed but recommend SNF.   Disposition:  Pending. Consider [facility] based on plan of care to be determined.  Hypertension  Stable . Permissive hypertension (OK if < 220/120) but gradually normalize in 5-7 days . Long-term BP goal normotensive  Hyperlipidemia  Home meds:  zocor 80, changed to lipitor 80 in hospital though unable to swallow  LDL 61, goal < 70  Continue statin at discharge  Diabetes type II  HgbA1c 8.1, goal < 7.0  Uncontrolled  Other Stroke Risk Factors  Advanced age  Murmur cigarette smoker, quit about 4 years ago   Coronary artery disease - MI, angioplasty  and stents  Known PFO  Other Active Problems  Parkinson's disease, followed by Dr. Carles Collet  chronic kidney disease stage III creatinine 2.5  Elevated troponin likely related to acute stroke.  Seen by cardiology  Agitation   Hospital day # [place], MSN, APRN, ANVP-BC, AGPCNP-BC Advanced Practice Stroke Nurse [place] for Schedule & Pager information 10/13/2017 8:18 AM   ATTENDING NOTE: I reviewed above note and agree with the assessment and plan. I have made any additions or clarifications directly to the above note. Pt was seen and examined.  Pt still has global aphasia, agitated at night and was given ativan. Today global aphasia, not following any command,  right UE and LE flaccid. On IVF. Family had meeting with palliative care and they need more time to discuss about their decisions. Will follow.  Rosalin Hawking, MD PhD Stroke Neurology 10/13/2017 6:03 PM   To contact Stroke Continuity provider, please refer to [email or web address] After hours, contact General Neurology

## 2017-10-13 NOTE — Progress Notes (Signed)
Night nurse informed me patient had became very agitated and a little combative this morning. He received Ativan. Patient  has been sleep all morning and afternoon.  Nurse has been unable to do a assessment. Pupils are round and reactive. He is not responsive to pain nor is he following commands.

## 2017-10-13 NOTE — Progress Notes (Signed)
PT. became very restless and agitated pulling of EKG leads , cothing and pushing his spouse at bed side. MD made aware.  Tylenol iv given for pain and ativan 1 mg for the agitation PT less restless at present. VS stable, will continue to monitor and endorse to incoming RN.

## 2017-10-13 NOTE — Progress Notes (Signed)
Pt will need SNF level of rehab. I have alerted SW. We will sign off at this time. [phone]

## 2017-10-13 NOTE — Progress Notes (Signed)
  Speech Language Pathology Treatment: Dysphagia  Patient Details Name: Ronald Benitez MRN: [id number] DOB: 10/01/1928 Today's Date: 10/13/2017 Time: 1430-1500 SLP Time Calculation (min) (ACUTE ONLY): 30 min  Assessment / Plan / Recommendation Clinical Impression  Skilled treatment session focused on education with pt and his family (wife, daughter and sons). Swallow dysfunction, aspiration risk (even with PEG support), cognitive deficits in setting of progress diagnosis of Parkinson was discussed. Objectively pt is more wet indicative of decreased pharyngeal strength when swallowing his own salvia. All questions answered at this time. Continue NPO status, aggressive oral care and education provided to nursing.    HPI HPI: Ronald Benitez a 84 y.o.malewith medical history significant ofDM; CAD s/p PCI; Parkinson's disease; HTN; HLD; depression; COPD; and stage 3 CKD presenting with significant neurologic deficits thought to be related to a stroke.This morning, his wife found that he had fallen in the bathroom. She is unsure when it happened - she found him about 715. She left him about 1AM and he was normal in his recliner. His walker was outside the bathroom when she found him. No urinary or fecal incontinence. He has a scalp bruise. He was his normal self yesterday without difficulty. Ambulates with his walker most of the time in the house. No aspiration concerns normally although he does have a lot of phlegm. Code stroke - awake, aphasic, trying to communicate but can't; right neglect, right hemiplegia. Acute/early subacute infarction involving the left lateral frontal lobe and mid insula. Additional small focus of acute/early subacute infarction within the left occipitotemporal junction. No associated hemorrhage or mass effect.      SLP Plan  Continue with current plan of care       Recommendations  Diet recommendations: NPO                Oral Care  Recommendations: Oral care QID Follow up Recommendations: Skilled Nursing facility SLP Visit Diagnosis: Dysphagia, oropharyngeal phase (R13.12) Plan: Continue with current plan of care       GO                Ronald Benitez 10/13/2017, 3:02 PM

## 2017-10-13 NOTE — Consult Note (Addendum)
Consultation Note Date: 10/13/2017   Patient Name: Ronald Benitez  DOB: Oct 27, 1928  MRN: [id number]  Age / Sex: 84 y.o., male  PCP: [name], MD Referring Physician: Elmarie Shiley, MD  Reason for Consultation: Establishing goals of care   HPI/Patient Profile: 84 y.o. male  with past medical history of Parkinson's disease, DM, HTN, HLD, MI s/p stents, hiatal hernia, GERD, depression, COPD, CKD, CAD, BPH, arthritis, first degree heart block admitted on 10/10/2017 after he was found down by his wife around 7am. Patient noted to have right sided hemiplegia and neglect. Neurology following. MRI revealed left lateral frontal/mid insular infarct and small occipital infarct. Patient with right upper and lower extremity weakness and global aphasia. Cardiology following for elevated troponin likely demand ischemia. Echo revealed mild LV dysfunction with EF 45-50% with grade 2 diastolic dysfunction. Not a candidate for cardiac cath secondary to acute CVA. Cardiology is recommending aspirin and high dose statin. Patient failed speech swallow evaluation on 5/8 with high risk for aspiration. Family considering PEG versus short term feeding tube. Palliative medicine consultation for goals of care.   Clinical Assessment and Goals of Care: 0830: I have reviewed medical records, discussed with care team, and met with wife Ronald Benitez), sons Ronald Benitez and Ronald Benitez), and daughter Ronald Benitez) in conference room to discuss diagnosis, prognosis, [place], EOL wishes, disposition and options. Son, Richardson Landry was on FaceTime. Patient had a episode of agitation this morning and was given tylenol and ativan. Resting comfortably during my visit.   Introduced Palliative Medicine as specialized medical care for people living with serious illness. It focuses on providing relief from the symptoms and stress of a serious illness. The goal is to improve quality  of life for both the patient and the family.  We discussed a brief life review of the patient. Married to [place] for 6 years. They have 4 children and 7 grandchildren. 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 Family describes him as very active and "on the go." He enjoyed many hobbies including softball, golf, and volunteering with their church. Ronald Benitez tells me he was diagnosed with Parkinson's about 3 years ago. Baseline prior to hospitalization, patient able to ambulate with cane/walker and requiring assist with ADL's. Tremors have become more frequent. Children do speak of his declining health status in the last few years. He has started to refuse blood sugar checks and some outpatient appointments. "He's tired."    Discussed events leading up to hospitalization and hospital diagnoses and interventions. Wife and children have a good understanding of concerns including ability to recover from this stroke with underlying Parkinson's and also the big decision they are faced with regarding nutrition/feeding tube.    Ronald Benitez struggles with making decisions because the patient never completed an advanced directive or living will. "He never wanted to talk about it." Ronald Benitez, Ronald Benitez, and Ronald Benitez confirm that their father never wanted to discuss end-of-life decisions. Children speak of "supporting any decision" she Ronald Benitez) decides.   Ronald Benitez has decided against resuscitation/life support in the event of a cardiac arrest.  Benefits/risks of feeding tubes were discussed at length. Ronald Benitez does not believe he would want prolonged artificial nutrition or PEG tube. She struggles with the decision for short-term feeding tube. She seems to understand concern that this will likely agitate him more and not contribute to a better 'quality of life' but struggles with the thought of "starving him to death" and feeling like she is "murduring" her husband if she does not feed him. Emotional support provided. Encouraged her to review Hard Choices  booklet. All children do agree that their father has a poor chance of meaningful recovery with underlying Parkinson's. Ronald Benitez speaks of him having "poor quality of life" prior to hospitalization.   We did discuss EOL expectations and concept of comfort care including comfort feeds. Ronald Benitez very tearful and is not able to make her decision yet. She is agreeable for me to come back this afternoon to further discuss her decision.   Questions and concerns were addressed. Emotional/spiritual support provided to this lovely family.   Addendum 1530: Follow-up with wife and children at bedside. Patient will not wake to voice or follow commands. Intermittently pulling at oxygen. Audible secretions.   Upon arrival to room, wife reading Hard Choices copy that I provided her earlier. Ronald Benitez has not yet made a decision to pursue feeding tube or not. Children tell me speech therapist came back this afternoon and further discussed concerns with dysphagia and aspiration with underlying Parkinson's and acute stroke. It seems wife is leaning against pursuing feeding tube, but is not ready to make final decision today. Answered questions and concerns. Reassured of continued support from palliative medicine team tomorrow.    SUMMARY OF RECOMMENDATIONS    DNR/DNI  Wife struggles with making EOL decisions because the patient does not have a living will. "He never wanted to talk about it."    Continue current interventions. Family needs more time to consider short-term feeding tube versus comfort measures. Discussed at length and also encouraged them to read Hard Choices copy that was given. It seems that wife/children are leaning against feeding tube.   PMT will continue to follow.   Code Status/Advance Care Planning:  DNR  Symptom Management:   Per attending  Palliative Prophylaxis:   Aspiration, Delirium Protocol, Oral Care and Turn Reposition  Additional Recommendations (Limitations, Scope,  Preferences):  DNR/DNI. Continue current interventions.   Psycho-social/Spiritual:   Desire for further Chaplaincy support: yes  Additional Recommendations: Caregiving  Support/Resources and Education on Hospice  Prognosis:   Unable to determine: Poor prognosis with acute stroke, dysphagia with high risk for aspiration, and underlying Parkinson's disease  Discharge Planning: To Be Determined      Primary Diagnoses: Present on Admission: . CVA (cerebral vascular accident) ([place]) . CAD (coronary artery disease) . Chronic renal disease, stage III ([place]) . Essential hypertension . Hyperlipidemia   I have reviewed the medical record, interviewed the patient and family, and examined the patient. The following aspects are pertinent.  Past Medical History:  Diagnosis Date  . Arthritis   . BPH (benign prostatic hyperplasia)   . Bruises easily   . CAD (coronary artery disease) cardiologist-  dr berry   a. 01/16/14 s/p DES x2 to RCA. b remote RCA stenting 03/ 1997; 04/ 1997 stenting OM2;  11/ 1997  stenting pCFX, OM2 (restenosed), angioplasty OM1   . CKD (chronic kidney disease), stage III ([place])   . COPD mixed type [facility]) pulmologist-  dr byrum   mild obstructive and restrictive  . Depression   .  Diverticulosis of colon   . Dyspnea   . First degree heart block   . GERD (gastroesophageal reflux disease)   . Hearing loss of both ears   . Hiatal hernia   . History of adenomatous polyp of colon    last colonoscopy 12-06-2016 tubular adenoma w/ high grade hyperplasia (cecal polyp),  tubular adenoma not high grade dysplasia (bx),  tubulovillious adenoma's not high grade dysplasia (sigmoid polyp x2)  . History of coronary artery stent placement    03/ 1997;  04/ 1997;  11/ 1997  (unknown type); DES 01/15/17  . History of non-ST elevation myocardial infarction (NSTEMI) 03/01/2000  . HTN (hypertension)   . Hyperlipidemia   . Idiopathic Parkinson's disease [facility])    neurologist-  dr tat   . Right bundle branch block (RBBB) with left anterior fascicular block (LAFB)   . Sebaceous cyst    chronic of buttock-- per pt open area size tennis ball  . Speech impediment   . Type 2 diabetes mellitus ([place])   . Unsteady gait   . Wears glasses    Social History   Socioeconomic History  . Marital status: Married    Spouse name: Not on file  . Number of children: Not on file  . Years of education: Not on file  . Highest education level: Not on file  Occupational History  . Occupation: retired    Fish farm manager: RETIRED  Social Needs  . Financial resource strain: Not on file  . Food insecurity:    Worry: Not on file    Inability: Not on file  . Transportation needs:    Medical: Not on file    Non-medical: Not on file  Tobacco Use  . Smoking status: Former Smoker    Packs/day: 1.50    Years: 60.00    Pack years: 90.00    Types: Cigarettes, Cigars    Last attempt to quit: 05/07/2013    Years since quitting: 4.4  . Smokeless tobacco: Never Used  . Tobacco comment: quit cigarettes 1985 1.5 PPD, quit cigars 05/2013-- 5 cigars per day  Substance and Sexual Activity  . Alcohol use: No  . Drug use: No  . Sexual activity: Not on file  Lifestyle  . Physical activity:    Days per week: Not on file    Minutes per session: Not on file  . Stress: Not on file  Relationships  . Social connections:    Talks on phone: Not on file    Gets together: Not on file    Attends religious service: Not on file    Active member of club or organization: Not on file    Attends meetings of clubs or organizations: Not on file    Relationship status: Not on file  Other Topics Concern  . Not on file  Social History Narrative  . Not on file   Family History  Problem Relation Age of Onset  . Heart attack Mother   . Heart disease Father   . Lung cancer Sister   . COPD Sister    Scheduled Meds: . aspirin  300 mg Rectal Daily   Or  . aspirin  325 mg Oral Daily  . atorvastatin  80 mg Oral  q1800  . chlorhexidine  15 mL Mouth Rinse BID  . enoxaparin (LOVENOX) injection  30 mg Subcutaneous Q24H  . insulin aspart  0-9 Units Subcutaneous Q4H  . mouth rinse  15 mL Mouth Rinse q12n4p   Continuous Infusions: . ampicillin-sulbactam (  UNASYN) IV Stopped (10/13/17 1357)   PRN Meds:.acetaminophen **OR** acetaminophen (TYLENOL) oral liquid 160 mg/5 mL **OR** acetaminophen, glycopyrrolate Medications Prior to Admission:  Prior to Admission medications   Medication Sig Start Date End Date Taking? Authorizing Provider  aspirin EC 81 MG EC tablet Take 1 tablet (81 mg total) by mouth daily. 01/17/14  Yes Eileen Stanford, PA-C  B Complex Vitamins (VITAMIN B COMPLEX PO) Take 1 tablet by mouth every evening.   Yes [provider]  captopril (CAPOTEN) 12.5 MG tablet Take 12.5 mg by mouth at bedtime.  11/05/13  Yes [provider]  carbidopa-levodopa (SINEMET IR) 25-100 MG tablet 2 in the morning, 2 in the afternoon, 1 in the evening 09/02/17  Yes Tat, Eustace Quail, DO  Cholecalciferol (VITAMIN D3) 2000 units capsule Take 2,000 Units by mouth daily.   Yes [provider]  clopidogrel (PLAVIX) 75 MG tablet Take 1 tablet (75 mg total) by mouth daily with breakfast. Contact our office for an appointment 09/16/17  Yes Lorretta Harp, MD  Coenzyme Q10 (COQ-10) 200 MG CAPS Take 200 mg by mouth daily.   Yes [provider]  Cyanocobalamin (VITAMIN B-12) 5000 MCG TBDP Take 5,000 mcg by mouth daily with supper.   Yes [provider]  finasteride (PROSCAR) 5 MG tablet Take 5 mg by mouth daily after supper.  05/25/13  Yes [provider]  glimepiride (AMARYL) 2 MG tablet Take 2 mg by mouth daily with breakfast.   Yes [provider]  Glucos-Chond-Hyal Ac-Ca Fructo ([place]) TABS Take 1 tablet by mouth 2 (two) times daily.   Yes [provider]  Homeopathic Products (ARNICARE ARNICA EX) Apply 1 application topically  daily as needed (joint pain).   Yes [provider]  metoprolol (LOPRESSOR) 25 MG tablet Take 1 tablet (25 mg total) by mouth 2 (two) times daily. 01/17/14  Yes Eileen Stanford, PA-C  Multiple Vitamins-Minerals (ICAPS) CAPS Take 1 capsule by mouth 2 (two) times daily.    Yes [provider]  Omega-3 Fatty Acids (FISH OIL) 1000 MG CAPS Take 1,000 mg by mouth daily.   Yes [provider]  polyethylene glycol (MIRALAX / GLYCOLAX) packet Take 17 g by mouth daily.    Yes [provider]  Sennosides-Docusate Sodium (STOOL SOFTENER & LAXATIVE PO) Take 1 tablet by mouth 2 (two) times daily.    Yes [provider]  simvastatin (ZOCOR) 80 MG tablet Take 80 mg by mouth at bedtime.    Yes [provider]  tamsulosin (FLOMAX) 0.4 MG CAPS capsule Take 0.4 mg by mouth daily with supper. 11/16/16  Yes [provider]  vitamin C (ASCORBIC ACID) 500 MG tablet Take 500 mg by mouth daily.   Yes [provider]  vitamin E 400 UNIT capsule Take 400 Units by mouth daily.   Yes [provider]  nitroGLYCERIN (NITROSTAT) 0.4 MG SL tablet Place 1 tablet (0.4 mg total) under the tongue every 5 (five) minutes x 3 doses as needed for chest pain. Patient not taking: Reported on 06/23/2017 01/17/14   Eileen Stanford, PA-C  traMADol (ULTRAM) 50 MG tablet Take 1-2 tablets (50-100 mg total) by mouth every 6 (six) hours as needed for moderate pain or severe pain. 12/15/16   Michael Boston, MD   No Known Allergies Review of Systems  Unable to perform ROS: Mental status change   Physical Exam  Constitutional: He is easily aroused. He appears ill.  HENT:  Head: Normocephalic and atraumatic.  Pulmonary/Chest: No accessory muscle usage. No tachypnea. No respiratory distress.  Neurological: He is easily aroused.  Aphasia  Skin: Skin is warm and dry.  Psychiatric: Cognition and memory are impaired. He is noncommunicative.  Impulsive  Nursing note  and vitals reviewed.  Vital Signs: BP (!) 141/65 (BP Location: Left Arm)   Pulse 66   Temp 97.7 F (36.5 C) (Oral)   Resp 20   Ht 5' 11" (1.803 m)   Wt 76.8 kg (169 lb 5 oz)   SpO2 100%   BMI 23.61 kg/m  Pain Scale: PAINAD   Pain Score: Asleep   SpO2: SpO2: 100 % O2 Device:SpO2: 100 % O2 Flow Rate: .O2 Flow Rate (L/min): 2 L/min  IO: Intake/output summary:   Intake/Output Summary (Last 24 hours) at 10/13/2017 1931 Last data filed at 10/13/2017 2297 Gross per 24 hour  Intake 300 ml  Output 750 ml  Net -450 ml    LBM: Last BM Date: 10/13/17 Baseline Weight: Weight: 76.8 kg (169 lb 5 oz) Most recent weight: Weight: 76.8 kg (169 lb 5 oz)     Palliative Assessment/Data: PPS 20%   Flowsheet Rows     Most Recent Value  Intake Tab  Referral Department  Hospitalist  Unit at Time of Referral  Med/Surg Unit  Palliative Care Primary Diagnosis  Neurology  Palliative Care Type  New Palliative care  Reason for referral  Clarify Goals of Care  Date first seen by Palliative Care  10/13/17  Clinical Assessment  Palliative Performance Scale Score  20%  Psychosocial & Spiritual Assessment  Palliative Care Outcomes  Patient/Family meeting held?  Yes  Who was at the meeting?  wife, four adult children  Palliative Care Outcomes  Clarified goals of care, Provided end of life care assistance, Provided psychosocial or spiritual support, Improved non-pain symptom therapy, Counseled regarding hospice, ACP counseling assistance      Time In/Out: 0820-0930, 1530-1600 Time Total: 166mn Greater than 50%  of this time was spent counseling and coordinating care related to the above assessment and plan.  Signed by:  MIhor Dow FNP-C Palliative Medicine Team  Phone: [phone]: [phone]  Please contact Palliative Medicine Team phone at [phone] questions and concerns.  For individual provider: See AShea Evans

## 2017-10-14 LAB — GLUCOSE, CAPILLARY
GLUCOSE-CAPILLARY: 126 mg/dL — AB (ref 65–99)
Glucose-Capillary: 106 mg/dL — ABNORMAL HIGH (ref 65–99)
Glucose-Capillary: 137 mg/dL — ABNORMAL HIGH (ref 65–99)
Glucose-Capillary: 77 mg/dL (ref 65–99)
Glucose-Capillary: 88 mg/dL (ref 65–99)

## 2017-10-14 MED ORDER — CHLORHEXIDINE GLUCONATE 0.12 % MT SOLN: 15.0000 mL | Freq: Two times a day (BID) | OROMUCOSAL | Status: DC

## 2017-10-14 MED ORDER — ORAL CARE MOUTH RINSE: 15.0000 mL | Freq: Two times a day (BID) | OROMUCOSAL | Status: DC

## 2017-10-14 MED ORDER — ACETAMINOPHEN 10 MG/ML IV SOLN
1000.0000 mg | Freq: Four times a day (QID) | INTRAVENOUS | Status: AC | PRN
  Administered 2017-10-14 – 2017-10-15 (×3): 1000 mg via INTRAVENOUS
  Filled 2017-10-14 (×4): qty 100

## 2017-10-14 MED ORDER — BISACODYL 10 MG RE SUPP: 10.0000 mg | Freq: Every day | RECTAL | Status: DC | PRN

## 2017-10-14 MED ORDER — DEXTROSE 5 % IV SOLN
INTRAVENOUS | Status: DC
  Administered 2017-10-14 – 2017-10-15 (×2): 1000 mL via INTRAVENOUS
  Administered 2017-10-16 – 2017-10-17 (×3): via INTRAVENOUS
  Administered 2017-10-17: 1000 mL via INTRAVENOUS
  Administered 2017-10-18: 18:00:00 via INTRAVENOUS

## 2017-10-14 MED ORDER — ORAL CARE MOUTH RINSE
15.0000 mL | Freq: Two times a day (BID) | OROMUCOSAL | Status: DC
  Administered 2017-10-14 – 2017-10-19 (×7): 15 mL via OROMUCOSAL

## 2017-10-14 NOTE — Progress Notes (Signed)
Late Entry for 10/13/17 @2100  Patient non verbal, does not follow commands. Family at bedside. Family very supportive. At this time patient lying supine, with pillows under extremities. Will continue to monitor. Emotional support given to family.

## 2017-10-14 NOTE — Progress Notes (Addendum)
Physical Therapy Treatment Patient Details Name: Ronald Benitez MRN: [id number] DOB: 18-Apr-1929 Today's Date: 10/14/2017    History of Present Illness 84 y.o. male with medical history significant of DM; CAD s/p PCI; Parkinson's disease; HTN; HLD; depression; COPD; and stage 3 CKD presenting with significant neurologic deficits Imaging revealed Acute/early subacute infarction involving the left lateral frontal lobe and mid insula. Additional small focus of acute/early subacute infarction within the left occipitotemporal junction.    PT Comments    Pt continues to progress slowly and at this time in agreement for SNF placement based on slow progress and poor prognosis.  Pt continues to be motivated and family is very supportive.  Pt able to follow commands for LE exercises and performed reciprocal LAQs.  Plan to continue PT per POC in acute setting.  Will inform supervision PT of need for change in recommendations at this time.    Follow Up Recommendations  Supervision/Assistance - 24 hour;SNF     Equipment Recommendations  (TBD)    Recommendations for Other Services Rehab consult     Precautions / Restrictions Precautions Precautions: Fall Restrictions Weight Bearing Restrictions: No    Mobility  Bed Mobility Overal bed mobility: Needs Assistance Bed Mobility: Rolling Rolling: Total assist;+2 for safety/equipment Sidelying to sit: Total assist;Max assist;+2 for safety/equipment       General bed mobility comments: Pt required rolling to R and L to perform perianal care.  Pt in sitting with L lateral lean.  Required assistance to maintain sitting.  Assistance to advance B LEs to edge of bed and to assist trunk into sitting. +2 total to scoot forward to edge of bed.    Transfers Overall transfer level: Needs assistance Equipment used: 2 person hand held assist   Sit to Stand: +2 physical assistance;Max assist         General transfer comment: +2 mod assistance to  power up into standing and max assistance +2 for stand pivot from bed to recliner chair.  Pt with gargled breathing (very wet sounding).    Ambulation/Gait Ambulation/Gait assistance: (NT)               Stairs             Wheelchair Mobility    Modified Rankin (Stroke Patients Only)       Balance Overall balance assessment: Needs assistance Sitting-balance support: Feet supported Sitting balance-Leahy Scale: Poor       Standing balance-Leahy Scale: Poor Standing balance comment: significant posterior and left bias in standing, 2 person moderate assist-max assistance.                            Cognition Arousal/Alertness: Awake/alert Behavior During Therapy: Flat affect Overall Cognitive Status: Difficult to assess                                        Exercises General Exercises - Lower Extremity Long Arc Quad: AROM;Both;10 reps;Seated(x2 reps ) Hip Flexion/Marching: AROM;Right;5 reps;Seated    General Comments        Pertinent Vitals/Pain Pain Assessment: No/denies pain    Home Living                      Prior Function            PT Goals (current goals can now be found  in the care plan section) Acute Rehab PT Goals Patient Stated Goal: unable to state Potential to Achieve Goals: Good Progress towards PT goals: Progressing toward goals    Frequency    Min 3X/week      PT Plan Discharge plan needs to be updated    Co-evaluation              AM-PAC PT "6 Clicks" Daily Activity  Outcome Measure  Difficulty turning over in bed (including adjusting bedclothes, sheets and blankets)?: Unable Difficulty moving from lying on back to sitting on the side of the bed? : Unable Difficulty sitting down on and standing up from a chair with arms (e.g., wheelchair, bedside commode, etc,.)?: Unable Help needed moving to and from a bed to chair (including a wheelchair)?: A Lot Help needed walking in  hospital room?: Total Help needed climbing 3-5 steps with a railing? : Total 6 Click Score: 7    End of Session Equipment Utilized During Treatment: Gait belt;Oxygen Activity Tolerance: Patient limited by fatigue Patient left: in chair;with call bell/phone within reach;with family/visitor present Nurse Communication: Mobility status PT Visit Diagnosis: Muscle weakness (generalized) (M62.81);Difficulty in walking, not elsewhere classified (R26.2);Other symptoms and signs involving the nervous system (R29.898)     Time: 7672-0947 PT Time Calculation (min) (ACUTE ONLY): 33 min  Charges:  $Therapeutic Activity: 23-37 mins                    G Codes:       Ronald Benitez, PTA pager [phone]    Cristela Blue 10/14/2017, 5:08 PM

## 2017-10-14 NOTE — Progress Notes (Signed)
  Speech Language Pathology Treatment: Dysphagia  Patient Details Name: Ronald Benitez MRN: [id number] DOB: Sep 19, 1928 Today's Date: 10/14/2017 Time: 4680-3212 SLP Time Calculation (min) (ACUTE ONLY): 36 min  Assessment / Plan / Recommendation Clinical Impression  Pt more alert today; using generalized gestures with right hand, but difficult to discern intent.  No functional communication, and not following commands.  Oral care provided. When repositioned, able to anticipate/receive ice chip with marginally improved effort to manipulate orally.  Significant delay before onset of palpable swallow. After 3-4 ice chips, delayed cough elicited.  Pt continued to reach for cup in effort to have more ice.  Family - two sons and daughter- present.  They continue to have questions about prognosis, swallowing.  Answered questions related to swallowing; showed video of MBS to the sons (daughter was present when initial education re: MBS was completed).  Discussed persisting dysphagia and ongoing aspiration risk.  They verbalize looking forward to meeting again with Palliative Care.    For now, given improved attention/alertness, allow 3-4 ice chips after thorough oral care. Position upright.  Family educated; discussed with Therapist, sports.  SLP will f/u Monday, 5/13 for [place].    HPI HPI: Ronald Benitez a 85 y.o.malewith medical history significant ofDM; CAD s/p PCI; Parkinson's disease; HTN; HLD; depression; COPD; and stage 3 CKD presenting with significant neurologic deficits thought to be related to a stroke.This morning, his wife found that he had fallen in the bathroom. She is unsure when it happened - she found him about 715. She left him about 1AM and he was normal in his recliner. His walker was outside the bathroom when she found him. No urinary or fecal incontinence. He has a scalp bruise. He was his normal self yesterday without difficulty. Ambulates with his walker most of the time in the  house. No aspiration concerns normally although he does have a lot of phlegm. Code stroke - awake, aphasic, trying to communicate but can't; right neglect, right hemiplegia. Acute/early subacute infarction involving the left lateral frontal lobe and mid insula. Additional small focus of acute/early subacute infarction within the left occipitotemporal junction. No associated hemorrhage or mass effect.      SLP Plan  Continue with current plan of care       Recommendations  Diet recommendations: NPO;Other(comment)(allow occasional ice chips after oral care)                Oral Care Recommendations: Oral care QID Follow up Recommendations: Skilled Nursing facility SLP Visit Diagnosis: Dysphagia, oropharyngeal phase (R13.12) Plan: Continue with current plan of care       GO                Ronald Benitez 10/14/2017, 4:57 PM

## 2017-10-14 NOTE — Progress Notes (Signed)
Daily Progress Note   Patient Name: Ronald Benitez       Date: 10/14/2017 DOB: Dec 14, 1928  Age: 84 y.o. MRN#: [id number] Attending Physician: Elmarie Shiley, MD Primary Care Physician: [name], MD Admit Date: 10/10/2017  Reason for Consultation/Follow-up: Establishing goals of care, Non pain symptom management and Psychosocial/spiritual support  Subjective: Patient seen, chart reviewed.  Continuing to work with family in terms of artificial nutrition in the setting of Parkinson's disease, chronic kidney disease and stroke.  Spoke to patient's wife [name] as well as 1 of their children, Les.  [name] is struggling with "will he suffer, starve to death ?"  Discussed at length artificial feeding at end-of-life, risks and benefits.  Patient is already quite congested and family does seem to grasp that further IV fluids, or artificial feeding would likely contribute to his symptom burden  Patient's other children are due to be arriving this afternoon and family are going to get together to revisit artificial feeding.  It is my sense from talking to them that they are not considering PEG but are considering something temporary, like a Cortrak.  Both [name] and patient's son [name], readily said "he would not want to live like this", including temporary tube feeding.  Length of Stay: 4  Current Medications: Scheduled Meds:  . aspirin  300 mg Rectal Daily   Or  . aspirin  325 mg Oral Daily  . atorvastatin  80 mg Oral q1800  . chlorhexidine  15 mL Mouth Rinse BID  . chlorhexidine  15 mL Mouth Rinse BID  . enoxaparin (LOVENOX) injection  30 mg Subcutaneous Q24H  . insulin aspart  0-9 Units Subcutaneous Q4H  . mouth rinse  15 mL Mouth Rinse q12n4p  . mouth rinse  15 mL Mouth Rinse  q12n4p  . mouth rinse  15 mL Mouth Rinse BID    Continuous Infusions: . acetaminophen Stopped (10/14/17 1124)  . ampicillin-sulbactam (UNASYN) IV 3 g (10/14/17 1249)  . dextrose 1,000 mL (10/14/17 0306)    PRN Meds: acetaminophen, acetaminophen **OR** acetaminophen (TYLENOL) oral liquid 160 mg/5 mL **OR** acetaminophen, bisacodyl, glycopyrrolate  Physical Exam  Constitutional:  Ill-appearing elderly man; nonverbal  HENT:  Head: Normocephalic and atraumatic.  Neck: Normal range of motion.  Cardiovascular: Normal rate.  Pulmonary/Chest: Effort normal.  Musculoskeletal: Normal range of  motion.  Neurological: He is alert.  Patient is aphasic Intermittently, following commands but there is no consistency  Skin: Skin is warm and dry.  Psychiatric:  Psychomotor restlessness observed  Nursing note and vitals reviewed.           Vital Signs: BP (!) 161/76 (BP Location: Right Arm)   Pulse 73   Temp 98 F (36.7 C) (Oral)   Resp 17   Ht 5\' 11"  (1.803 m)   Wt 76.8 kg (169 lb 5 oz)   SpO2 99%   BMI 23.61 kg/m  SpO2: SpO2: 99 % O2 Device: O2 Device: Nasal Cannula O2 Flow Rate: O2 Flow Rate (L/min): 1.5 L/min  Intake/output summary:   Intake/Output Summary (Last 24 hours) at 10/14/2017 1309 Last data filed at 10/14/2017 0549 Gross per 24 hour  Intake -  Output 450 ml  Net -450 ml   LBM: Last BM Date: 10/13/17 Baseline Weight: Weight: 76.8 kg (169 lb 5 oz) Most recent weight: Weight: 76.8 kg (169 lb 5 oz)       Palliative Assessment/Data:    Flowsheet Rows     Most Recent Value  Intake Tab  Referral Department  Hospitalist  Unit at Time of Referral  Med/Surg Unit  Palliative Care Primary Diagnosis  Neurology  Palliative Care Type  New Palliative care  Reason for referral  Clarify Goals of Care  Date first seen by Palliative Care  10/13/17  Clinical Assessment  Palliative Performance Scale Score  20%  Psychosocial & Spiritual Assessment  Palliative Care Outcomes   Patient/Family meeting held?  Yes  Who was at the meeting?  wife, four adult children  Palliative Care Outcomes  Clarified goals of care, Provided end of life care assistance, Provided psychosocial or spiritual support, Improved non-pain symptom therapy, Counseled regarding hospice, ACP counseling assistance      Patient Active Problem List   Diagnosis Date Noted  . Palliative care by specialist   . Goals of care, counseling/discussion   . Agitation   . Increased oropharyngeal secretions   . Parkinson's disease ([place])   . Dysphagia   . Acute ischemic stroke ([place])   . CVA (cerebral vascular accident) ([place]) 10/10/2017  . Elevated troponin   . Abnormal CT of the abdomen 12/06/2016  . Change in bowel habits 12/06/2016  . CN (constipation) 12/06/2016  . COPD, mild ([place]) 04/09/2014  . Unstable angina ([place]) 01/17/2014  . Mitral regurgitation   . GERD (gastroesophageal reflux disease)   . DM (diabetes mellitus) ([place])   . Asymptomatic LV dysfunction   . CAD (coronary artery disease) 01/15/2014  . Abnormal nuclear cardiac imaging test 01/14/2014  . Chronic renal disease, stage III ([place]) 01/14/2014  . Sinus bradycardia- HR 43 01/14/2014  . Essential hypertension 11/16/2013  . Hyperlipidemia 11/16/2013  . Depressive disorder, not elsewhere classified 05/08/2013    Palliative Care Assessment & Plan   Patient Profile: 84 y.o. male  with past medical history of Parkinson's disease, DM, HTN, HLD, MI s/p stents, hiatal hernia, GERD, depression, COPD, CKD, CAD, BPH, arthritis, first degree heart block admitted on 10/10/2017 after he was found down by his wife around 7am. Patient noted to have right sided hemiplegia and neglect. Neurology following. MRI revealed left lateral frontal/mid insular infarct and small occipital infarct. Patient with right upper and lower extremity weakness and global aphasia. Cardiology following for elevated troponin likely demand ischemia. Echo revealed mild LV  dysfunction with EF 45-50% with grade 2 diastolic dysfunction. Not a candidate  for cardiac cath secondary to acute CVA. Cardiology is recommending aspirin and high dose statin. Patient failed speech swallow evaluation on 5/8 with high risk for aspiration. Family considering PEG versus short term feeding tube.   Palliative medicine consultation for goals of care.   Recommendations/Plan:  Continue n.p.o. status for now  Family remain conflicted about core track placement but appears to be leaning against this procedure  Palliative medicine hopefully will be able to arrange a full family meeting on 10/15/2017.  We are waiting to hear back for a time  Patient would qualify for his hospice Medicare benefit in the home.  We will address this at hopefully a family meeting on 10/15/2017  Goals of Care and Additional Recommendations:  Limitations on Scope of Treatment: Avoid Hospitalization, No Chemotherapy, No Radiation, No Surgical Procedures and No Tracheostomy  Code Status:    Code Status Orders  (From admission, onward)        Start     Ordered   10/12/17 1040  Do not attempt resuscitation (DNR)  Continuous    Question Answer Comment  In the event of cardiac or respiratory ARREST Do not call a "code blue"   In the event of cardiac or respiratory ARREST Do not perform Intubation, CPR, defibrillation or ACLS   In the event of cardiac or respiratory ARREST Use medication by any route, position, wound care, and other measures to relive pain and suffering. May use oxygen, suction and manual treatment of airway obstruction as needed for comfort.      10/12/17 1039    Code Status History    Date Active Date Inactive Code Status Order ID Comments User Context   10/10/2017 1235 10/12/2017 1039 Full Code [id number]  Karmen Bongo, MD ED   01/16/2014 1754 01/17/2014 1353 Full Code [id number]  Troy Sine, MD Inpatient   01/15/2014 1639 01/16/2014 1754 Full Code [id number]  Lorretta Harp, MD  Inpatient   01/14/2014 1644 01/15/2014 1639 Full Code [id number]  Erlene Quan, PA-C Inpatient       Prognosis:   < 6 months in the setting of progressive Parkinson's disease, CVA, multisystem failure with chronic kidney disease, COPD, diabetes; patient is extremely high risk of aspiration pneumonia particularly if artificial feeding is pursued.  He is quite congested and concurrently aspirating on his own secretions  Discharge Planning:  To Be Determined    Thank you for allowing the Palliative Medicine Team to assist in the care of this patient.   Time In: 1230 Time Out: 1310 Total Time 40 min Prolonged Time Billed  no       Greater than 50%  of this time was spent counseling and coordinating care related to the above assessment and plan.  Dory Horn, NP  Please contact Palliative Medicine Team phone at [phone] for questions and concerns.

## 2017-10-14 NOTE — Progress Notes (Signed)
PROGRESS NOTE    Ronald Benitez  RFX:[id number] DOB: 11-10-1928 DOA: 10/10/2017 PCP: [name], MD   Brief Narrative: 84 yo M with good prior functional status, history of DM, CAD s/p PCI, Parkinson's ds, HTN, HLD, CKD who was brought to the hospital on 5/6 after being found down in the bathroom by his wife around 7 am. He was noted to have right sided hemiplegia and neglect. Neurology was consulted in the ED. Cardiology was consulted as well for positive troponin and ST segment changes on EKG. No reports of chest discomfort prior to this episode.    Assessment & Plan:   Principal Problem:   CVA (cerebral vascular accident) ([place]) Active Problems:   Essential hypertension   Hyperlipidemia   Chronic renal disease, stage III (HCC)   CAD (coronary artery disease)   DM (diabetes mellitus) ([place])   Elevated troponin   Acute ischemic stroke ([place])   Parkinson's disease ([place])   Dysphagia   Palliative care by specialist   Goals of care, counseling/discussion   Agitation   Increased oropharyngeal secretions  1-Acute CVA -MRI on admission showed acute/early subacute infarction involving the left lateral frontal lobe and mid insula. Additional small focus of acute/early subacute infarction within the left occipitotemporal junction. No associated hemorrhage or mass effect. -neurology following.  -ECHO ef lower 45 %, wall motion abnormalities.  -LDL 61 A1-c 8.1 -Aspirin per rectum.  -Fail swallow evaluation. Family unsure about NG tube,  Palliative met with  Family and discussed goals. Family has not made decision regarding tube feeding. They seem to be clear regarding not to pursue PEG tube -more alert today. Follows some command, still with global aphasia.   2-PNA; Fever, chest x ray with patchy consolidation left upper lobe and possible right bases. Started IV antibiotics to cover for PNA.  Continue with IV antibiotics.   3-Elevation troponin;  ECHO with wall motion  abnormalities.  Cardiology following. Await rec.  Peak to 1.9. Thought to be related to CVA.  No further cardiac evaluation at this time due to recent stroke.   DM; SSI.   CKD stage IV; Cr at 2.3 stable. Continue to monitor.   HTN;  Permissive HTN.   Parkinson's Diseases;  Medications on hold due to NPO status.   DVT prophylaxis: lovenox Code Status: DNR, discussed with wife. Patient wouldn't want to be resuscitated.  Family Communication: wife and son at bedside. They will meet with palliative again today  Disposition Plan: to be determine,   Consultants:   Neurology  Cardiology   Palliative.      Procedures:   ECHO;    Antimicrobials:  Unasyn 5-08   Subjective: Alert today, follows command. Global aphasia.    Objective: Vitals:   10/13/17 2008 10/14/17 0010 10/14/17 0448 10/14/17 1003  BP: (!) 132/58 (!) 142/76 (!) 165/76 (!) 171/90  Pulse: 62 64 72 86  Resp: 20 20 20 17   Temp: 98.4 F (36.9 C) 97.8 F (36.6 C) 97.7 F (36.5 C) 98.2 F (36.8 C)  TempSrc: Oral Oral Oral Oral  SpO2: 100% 100% 100% 98%  Weight:      Height:        Intake/Output Summary (Last 24 hours) at 10/14/2017 1038 Last data filed at 10/14/2017 0549 Gross per 24 hour  Intake -  Output 450 ml  Net -450 ml   Filed Weights   10/10/17 0906  Weight: 76.8 kg (169 lb 5 oz)    Examination:  General exam: Lethargic.  Respiratory  system: Bilateral ronchus.  Cardiovascular system: S 1, S 2 RRR Gastrointestinal system; BS present, soft, nt Central nervous system: aphasia, follows some command.  Extremities: no edema.  Skin: no rash     Data Reviewed: I have personally reviewed following labs and imaging studies  CBC: Recent Labs  Lab 10/10/17 0821 10/10/17 0834 10/12/17 0534 10/13/17 0653  WBC 9.2  --  8.4 8.2  NEUTROABS 6.9  --   --   --   HGB 11.5* 12.2* 11.2* 11.1*  HCT 35.7* 36.0* 35.1* 35.0*  MCV 95.2  --  95.1 94.6  PLT 215  --  212 161   Basic  Metabolic Panel: Recent Labs  Lab 10/10/17 0821 10/10/17 0834 10/12/17 0534 10/13/17 0653  NA 139 142 143 144  K 4.0 4.0 4.1 4.0  CL 106 108 112* 113*  CO2 21*  --  21* 22  GLUCOSE 156* 154* 136* 121*  BUN 46* 40* 41* 38*  CREATININE 2.62* 2.50* 2.35* 2.30*  CALCIUM 9.4  --  9.1 9.1   GFR: Estimated Creatinine Clearance: 24.1 mL/min (A) (by C-G formula based on SCr of 2.3 mg/dL (H)). Liver Function Tests: Recent Labs  Lab 10/10/17 0821  AST 27  ALT 7*  ALKPHOS 73  BILITOT 1.3*  PROT 7.0  ALBUMIN 3.5   No results for input(s): LIPASE, AMYLASE in the last 168 hours. No results for input(s): AMMONIA in the last 168 hours. Coagulation Profile: Recent Labs  Lab 10/10/17 0821  INR 1.10   Cardiac Enzymes: Recent Labs  Lab 10/10/17 1804 10/11/17 0019 10/11/17 0621 10/11/17 1214 10/11/17 1733  TROPONINI 0.59* 1.34* 1.71* 1.97* 1.79*   BNP (last 3 results) No results for input(s): PROBNP in the last 8760 hours. HbA1C: No results for input(s): HGBA1C in the last 72 hours. CBG: Recent Labs  Lab 10/13/17 1134 10/13/17 1632 10/13/17 2044 10/14/17 0028 10/14/17 0404  GLUCAP 90 85 87 77 88   Lipid Profile: No results for input(s): CHOL, HDL, LDLCALC, TRIG, CHOLHDL, LDLDIRECT in the last 72 hours. Thyroid Function Tests: No results for input(s): TSH, T4TOTAL, FREET4, T3FREE, THYROIDAB in the last 72 hours. Anemia Panel: No results for input(s): VITAMINB12, FOLATE, FERRITIN, TIBC, IRON, RETICCTPCT in the last 72 hours. Sepsis Labs: No results for input(s): PROCALCITON, LATICACIDVEN in the last 168 hours.  No results found for this or any previous visit (from the past 240 hour(s)).       Radiology Studies: Dg Swallowing Func-speech Pathology  Result Date: 10/12/2017 Objective Swallowing Evaluation: Type of Study: MBS-Modified Barium Swallow Study  Patient Details Name: KAYDENCE BABA MRN: [id number] Date of Birth: [birth year] Today's Date: 10/12/2017 Time:  SLP Start Time (ACUTE ONLY): 1105 -SLP Stop Time (ACUTE ONLY): 1130 SLP Time Calculation (min) (ACUTE ONLY): 25 min Past Medical History: Past Medical History: Diagnosis Date . Arthritis  . BPH (benign prostatic hyperplasia)  . Bruises easily  . CAD (coronary artery disease) cardiologist-  dr berry  a. 01/16/14 s/p DES x2 to RCA. b remote RCA stenting 03/ 1997; 04/ 1997 stenting OM2;  11/ 1997  stenting pCFX, OM2 (restenosed), angioplasty OM1  . CKD (chronic kidney disease), stage III ([place])  . COPD mixed type [facility]) pulmologist-  dr byrum  mild obstructive and restrictive . Depression  . Diverticulosis of colon  . Dyspnea  . First degree heart block  . GERD (gastroesophageal reflux disease)  . Hearing loss of both ears  . Hiatal hernia  . History of adenomatous polyp of  colon   last colonoscopy 12-06-2016 tubular adenoma w/ high grade hyperplasia (cecal polyp),  tubular adenoma not high grade dysplasia (bx),  tubulovillious adenoma's not high grade dysplasia (sigmoid polyp x2) . History of coronary artery stent placement   03/ 1997;  04/ 1997;  11/ 1997  (unknown type); DES 01/15/17 . History of non-ST elevation myocardial infarction (NSTEMI) 03/01/2000 . HTN (hypertension)  . Hyperlipidemia  . Idiopathic Parkinson's disease [facility])   neurologist-  dr tat . Right bundle branch block (RBBB) with left anterior fascicular block (LAFB)  . Sebaceous cyst   chronic of buttock-- per pt open area size tennis ball . Speech impediment  . Type 2 diabetes mellitus ([place])  . Unsteady gait  . Wears glasses  Past Surgical History: Past Surgical History: Procedure Laterality Date . CARDIAC CATHETERIZATION  02-10-2000  dr Daneen Schick  high-grade functional total occludion of PDA, high-grade obstruction in branch of the acute margainal branch of RCA,  total occlusion of branch of the D1 with high-grade obstruction in the second branch of D1; mLAD 50%,  third obtuse marginal branch 70-80%; all of these sites of stenosis in the branch  vessels are not signifinantly changed from cardiac cath. after PCI 1997,except acute marginal br  . CARDIAC CATHETERIZATION  05-10-2005  dr Tamala Julian  severe CAD w/ total occlusion D1, high-grade obstruction in acute marginal of RCA and PDA, high-grade ostruction in more superior branch of OM1 w/ wide patency of second larger branch of OM1;  continuation severe diffuse disease CFX , has very limited distribution, LAD main portion patent as is prox., mid, & distal RCA; ef 50% w/ mild anterolateral hypokinesis  . CARDIOVASCULAR STRESS TEST  12-12-2013  dr berry  Intermediate nuclear study w/ significant reversible lateral , apical lateral defect which may represent diagonal vessel ischemia/ normal LV wall motion,  lateral hypokinesis w/ ef 48% . CATARACT EXTRACTION W/ INTRAOCULAR LENS IMPLANT Bilateral 2009 approx. . COLONOSCOPY WITH PROPOFOL N/A 12/06/2016  Procedure: COLONOSCOPY WITH PROPOFOL;  Surgeon: Wilford Corner, MD;  Location: [place];  Service: Endoscopy;  Laterality: N/A; . CORONARY ANGIOPLASTY WITH STENT PLACEMENT  08/1995  dr Daneen Schick  stenting to RCA . CORONARY ANGIOPLASTY WITH STENT PLACEMENT  04/ 1997  dr Daneen Schick  stenting to [place] WITH STENT PLACEMENT  11/ 1997  dr Daneen Schick  stenting to proxCFX, OM2 (restenosed), PTCA to OM1 . ESOPHAGOGASTRODUODENOSCOPY ENDOSCOPY   . LEFT HEART CATHETERIZATION WITH CORONARY ANGIOGRAM N/A 01/15/2014  Procedure: LEFT HEART CATHETERIZATION WITH CORONARY ANGIOGRAM;  Surgeon: Lorretta Harp, MD;  Location: [facility] CATH LAB;  Service: Cardiovascular;  Laterality: N/A; occluded moderate size diagonal branch which fills by collaterals faintly;  patent mid AV groove CFX stent, patent mRCA stent w/ high-grade tandem stenosis's in the mid and distal portion . MASS EXCISION Left 12/15/2016  Procedure: REMOVAL OF LEFT GLUTEAL SUBCUTANEOUS MASS;  Surgeon: Michael Boston, MD;  Location: [facility];  Service: General;  Laterality: Left; . OPEN INGUINAL HERNIA  REPAIR W/ MESH Right 01/ 24/ 2012   dr Dalbert Batman . PERCUTANEOUS CORONARY ROTOBLATOR INTERVENTION (PCI-R) N/A 01/16/2014  Procedure: PERCUTANEOUS CORONARY ROTOBLATOR INTERVENTION (PCI-R);  Surgeon: Troy Sine, MD;  Location: [facility] CATH LAB;  Service: Cardiovascular;  Laterality: N/A;  PTCI to RCA and DES x2 to mid and distal RCA . TONSILLECTOMY  child . TRANSTHORACIC ECHOCARDIOGRAM  12-06-2013   dr berry  mild focal basal hypertrophy of the septum,  ef 50-55%,  grade 3 diastolic dysfunction/  mild  AR/ mild to moderate MR/  increased thickness of the atrial septum consistant with lipomatous hypertrophy and very small PFO (agitated saline contrast study showed small right-to-left atrial level shunt/  trivial PR/  mild RAE  HPI: JARRETTE DEHNER a 84 y.o.malewith medical history significant ofDM; CAD s/p PCI; Parkinson's disease; HTN; HLD; depression; COPD; and stage 3 CKD presenting with significant neurologic deficits thought to be related to a stroke.This morning, his wife found that he had fallen in the bathroom. She is unsure when it happened - she found him about 715. She left him about 1AM and he was normal in his recliner. His walker was outside the bathroom when she found him. No urinary or fecal incontinence. He has a scalp bruise. He was his normal self yesterday without difficulty. Ambulates with his walker most of the time in the house. No aspiration concerns normally although he does have a lot of phlegm. Code stroke - awake, aphasic, trying to communicate but can't; right neglect, right hemiplegia. Acute/early subacute infarction involving the left lateral frontal lobe and mid insula. Additional small focus of acute/early subacute infarction within the left occipitotemporal junction. No associated hemorrhage or mass effect.  Subjective: alert, nonverbal Assessment / Plan / Recommendation CHL IP CLINICAL IMPRESSIONS 10/12/2017 Clinical Impression Pt presents with mod-severe oropharyngeal  dysphagia marked by poor oral attention, reduced labial seal with anterior spillage; oral holding of POs with limited effort to propel to pharynx.  There was passive spilling of POs into pharynx, with reduced hyolaryngeal mobility/poor epiglottic closure, leading to immediate aspiration of thin liquids.  Cough response did not eject penetrate/aspirate. Nectar liquids penetrated the larynx; purees rested in pharynx and oral cavity.  Study was discontinued at this time and oral residue was removed via suctioning.  Pt is a high aspiration risk at this time.  Video and nature of dysphagia was viewed and discussed with pt's wife and daughter.  Results D/W Dr. Tyrell Antonio.  Palliative medicine consult is potentially pending.  Recommend NPO for now.  SLP Visit Diagnosis Dysphagia, oropharyngeal phase (R13.12) Attention and concentration deficit following -- Frontal lobe and executive function deficit following -- Impact on safety and function Risk for inadequate nutrition/hydration;Severe aspiration risk   CHL IP TREATMENT RECOMMENDATION 10/12/2017 Treatment Recommendations Therapy as outlined in treatment plan below   Prognosis 10/11/2017 Prognosis for Safe Diet Advancement Fair Barriers to Reach Goals Cognitive deficits;Severity of deficits Barriers/Prognosis Comment -- CHL IP DIET RECOMMENDATION 10/12/2017 SLP Diet Recommendations NPO Liquid Administration via -- Medication Administration -- Compensations -- Postural Changes --   No flowsheet data found.  CHL IP FOLLOW UP RECOMMENDATIONS 10/12/2017 Follow up Recommendations Inpatient Rehab   CHL IP FREQUENCY AND DURATION 10/11/2017 Speech Therapy Frequency (ACUTE ONLY) min 2x/week Treatment Duration --      CHL IP ORAL PHASE 10/12/2017 Oral Phase Impaired Oral - Pudding Teaspoon -- Oral - Pudding Cup -- Oral - Honey Teaspoon -- Oral - Honey Cup -- Oral - Nectar Teaspoon Left anterior bolus loss;Right anterior bolus loss;Weak lingual manipulation;Incomplete tongue to palate  contact;Holding of bolus;Reduced posterior propulsion;Delayed oral transit;Decreased bolus cohesion Oral - Nectar Cup -- Oral - Nectar Straw -- Oral - Thin Teaspoon Left anterior bolus loss;Right anterior bolus loss;Weak lingual manipulation;Incomplete tongue to palate contact;Holding of bolus;Reduced posterior propulsion;Delayed oral transit;Decreased bolus cohesion Oral - Thin Cup -- Oral - Thin Straw -- Oral - Puree Weak lingual manipulation;Incomplete tongue to palate contact;Holding of bolus;Reduced posterior propulsion;Delayed oral transit;Decreased bolus cohesion Oral - Mech Soft -- Oral -  Regular -- Oral - Multi-Consistency -- Oral - Pill -- Oral Phase - Comment --  CHL IP PHARYNGEAL PHASE 10/12/2017 Pharyngeal Phase Impaired Pharyngeal- Pudding Teaspoon -- Pharyngeal -- Pharyngeal- Pudding Cup -- Pharyngeal -- Pharyngeal- Honey Teaspoon -- Pharyngeal -- Pharyngeal- Honey Cup -- Pharyngeal -- Pharyngeal- Nectar Teaspoon Delayed swallow initiation-pyriform sinuses;Reduced pharyngeal peristalsis;Reduced epiglottic inversion;Reduced anterior laryngeal mobility;Reduced laryngeal elevation;Reduced airway/laryngeal closure;Reduced tongue base retraction;Penetration/Aspiration during swallow;Pharyngeal residue - valleculae Pharyngeal Material enters airway, CONTACTS cords and not ejected out Pharyngeal- Nectar Cup -- Pharyngeal -- Pharyngeal- Nectar Straw -- Pharyngeal -- Pharyngeal- Thin Teaspoon Delayed swallow initiation-pyriform sinuses;Reduced pharyngeal peristalsis;Reduced epiglottic inversion;Reduced anterior laryngeal mobility;Reduced laryngeal elevation;Reduced airway/laryngeal closure;Reduced tongue base retraction;Pharyngeal residue - valleculae;Penetration/Aspiration before swallow Pharyngeal Material enters airway, passes BELOW cords without attempt by patient to eject out (silent aspiration);Material enters airway, passes BELOW cords and not ejected out despite cough attempt by patient Pharyngeal- Thin  Cup -- Pharyngeal -- Pharyngeal- Thin Straw -- Pharyngeal -- Pharyngeal- Puree Delayed swallow initiation-pyriform sinuses;Reduced pharyngeal peristalsis;Reduced epiglottic inversion;Reduced anterior laryngeal mobility;Reduced laryngeal elevation;Reduced tongue base retraction;Pharyngeal residue - valleculae Pharyngeal Material does not enter airway Pharyngeal- Mechanical Soft -- Pharyngeal -- Pharyngeal- Regular -- Pharyngeal -- Pharyngeal- Multi-consistency -- Pharyngeal -- Pharyngeal- Pill -- Pharyngeal -- Pharyngeal Comment --  No flowsheet data found. CHL IP GO 09/16/2016 Functional Assessment Tool Used asha noms, clinical judgment,  Functional Limitations Other Speech Language Pathology Swallow Current Status [phone]) (None) Swallow Goal Status (M7544) (None) Swallow Discharge Status [phone]) (None) Motor Speech Current Status [phone]) (None) Motor Speech Goal Status [phone]) (None) Motor Speech Goal Status [phone]) (None) Spoken Language Comprehension Current Status [phone]) (None) Spoken Language Comprehension Goal Status (R8309) (None) Spoken Language Comprehension Discharge Status [phone]) (None) Spoken Language Expression Current Status [phone]) (None) Spoken Language Expression Goal Status [phone]) (None) Spoken Language Expression Discharge Status [phone]) (None) Attention Current Status (Y9244) (None) Attention Goal Status (Q2863) (None) Attention Discharge Status [phone]) (None) Memory Current Status (H6579) (None) Memory Goal Status (U3833) (None) Memory Discharge Status (X8329) (None) Voice Current Status (V9166) (None) Voice Goal Status (M6004) (None) Voice Discharge Status [phone]) (None) Other Speech-Language Pathology Functional Limitation Current Status [phone]) (None) Other Speech-Language Pathology Functional Limitation Goal Status (T5320) CI Other Speech-Language Pathology Functional Limitation Discharge Status [phone]) CI Juan Quam Laurice 10/12/2017, 1:49 PM                   Scheduled  Meds: . aspirin  300 mg Rectal Daily   Or  . aspirin  325 mg Oral Daily  . atorvastatin  80 mg Oral q1800  . chlorhexidine  15 mL Mouth Rinse BID  . enoxaparin (LOVENOX) injection  30 mg Subcutaneous Q24H  . insulin aspart  0-9 Units Subcutaneous Q4H  . mouth rinse  15 mL Mouth Rinse q12n4p   Continuous Infusions: . acetaminophen    . ampicillin-sulbactam (UNASYN) IV Stopped (10/14/17 0115)  . dextrose 1,000 mL (10/14/17 0306)     LOS: 4 days    Time spent: 35 minutes.     Elmarie Shiley, MD [facility] Pager [phone]  If 7PM-7AM, please contact night-coverage [email or web address] Password [facility] 10/14/2017, 10:38 AM

## 2017-10-14 NOTE — Progress Notes (Signed)
STROKE TEAM PROGRESS NOTE   INTERVAL HISTORY Patient grandsons are at bedside. Pt no acute event overnight. Family still in conversation with palliative care to determine goal of care. Pt more awake alert today and able to follow several simple commands but still nonverbal.    Vitals:   10/13/17 1121 10/13/17 2008 10/14/17 0010 10/14/17 0448  BP: (!) 141/65 (!) 132/58 (!) 142/76 (!) 165/76  Pulse: 66 62 64 72  Resp: _0 Temp: 97.7 F (36.5 C) 98.4 F (36.9 C) 97.8 F (36.6 C) 97.7 F (36.5 C)  TempSrc: Oral Oral Oral Oral  SpO2: 100% 100% 100% 100%  Weight:      Height:        CBC:  CBC Latest Ref Rng & Units 10/13/2017 10/12/2017 10/10/2017  WBC 4.0 - 10.5 K/uL 8.2 8.4 -  Hemoglobin 13.0 - 17.0 g/dL 11.1(L) 11.2(L) 12.2(L)  Hematocrit 39.0 - 52.0 % 35.0(L) 35.1(L) 36.0(L)  Platelets 150 - 400 K/uL 205 212 -     Comprehensive Metabolic Panel:   CMP Latest Ref Rng & Units 10/13/2017 10/12/2017 10/10/2017  Glucose 65 - 99 mg/dL 121(H) 136(H) 154(H)  BUN 6 - 20 mg/dL 38(H) 41(H) 40(H)  Creatinine 0.61 - 1.24 mg/dL 2.30(H) 2.35(H) 2.50(H)  Sodium 135 - 145 mmol/L 144 143 142  Potassium 3.5 - 5.1 mmol/L 4.0 4.1 4.0  Chloride 101 - 111 mmol/L 113(H) 112(H) 108  CO2 22 - 32 mmol/L 22 21(L) -  Calcium 8.9 - 10.3 mg/dL 9.1 9.1 -  Total Protein 6.5 - 8.1 g/dL - - -  Total Bilirubin 0.3 - 1.2 mg/dL - - -  Alkaline Phos 38 - 126 U/L - - -  AST 15 - 41 U/L - - -  ALT 17 - 63 U/L - - -   Lipid Panel:     Component Value Date/Time   CHOL 120 10/11/2017 0621   TRIG 76 10/11/2017 0621   HDL 44 10/11/2017 0621   CHOLHDL 2.7 10/11/2017 0621   VLDL 15 10/11/2017 0621   LDLCALC 61 10/11/2017 0621   HgbA1c:  Lab Results  Component Value Date   HGBA1C 8.1 (H) 10/10/2017   Urine Drug Screen:     Component Value Date/Time   LABOPIA NONE DETECTED 10/10/2017 0821   COCAINSCRNUR NONE DETECTED 10/10/2017 0821   LABBENZ NONE DETECTED 10/10/2017 0821   AMPHETMU NONE DETECTED  10/10/2017 0821   THCU NONE DETECTED 10/10/2017 0821   LABBARB NONE DETECTED 10/10/2017 0821    Alcohol Level     Component Value Date/Time   ETH <10 10/10/2017 1030    IMAGING I have personally reviewed the radiological images below and agree with the radiology interpretations.  Ct Angio Head W Or Wo Contrast  Result Date: 10/10/2017 CLINICAL DATA:  84 year old male with aphasia.  Code stroke. EXAM: CT ANGIOGRAPHY HEAD AND NECK CT PERFUSION BRAIN TECHNIQUE: Multidetector CT imaging of the head and neck was performed using the standard protocol during bolus administration of intravenous contrast. Multiplanar CT image reconstructions and MIPs were obtained to evaluate the vascular anatomy. Carotid stenosis measurements (when applicable) are obtained utilizing NASCET criteria, using the distal internal carotid diameter as the denominator. Multiphase CT imaging of the brain was performed following IV bolus contrast injection. Subsequent parametric perfusion maps were calculated using RAPID software. CONTRAST:  142m ISOVUE-370 IOPAMIDOL (ISOVUE-370) INJECTION 76% COMPARISON:  Noncontrast head CT 0829 hours today. Cervical spine MRI 11/24/2011. FINDINGS: CT Brain Perfusion Findings: ASPECTS 10 at [phone]  hours today. CBF (<30%) Volume: 19 milliliters Perfusion (Tmax>6.0s) volume: 33 milliliters Mismatch Volume: 14 milliliters Infarction Location:Middle left MCA territory/operculum CTA NECK Skeleton: No acute osseous abnormality identified. Generalized cervical spine degeneration. Upper chest: Small volume retained secretions in the visible trachea. Respiratory motion artifact in the lung apices but evidence of emphysema in the right lung apex. Incidental azygos fissure (normal variant). Other neck: Small volume retained secretions in the hypopharynx. No neck mass or lymphadenopathy. Aortic arch: Aberrancy origin of the right subclavian artery, normal variant. Moderate Calcified aortic atherosclerosis. Right  carotid system: No proximal right CCA stenosis despite soft plaque. Calcified plaque at the right carotid bifurcation and right ICA bulb with no significant stenosis. Moderately tortuous mid cervical right ICA with calcified plaque and a kinked appearance, but otherwise no stenosis to the skull base. Left carotid system: Left CCA origin atherosclerosis without stenosis. Fairly bulky left carotid bifurcation calcified plaque tracking into the left ICA origin and bulb. Stenosis at the distal bulb up to 60 % with respect to the distal vessel (series 11, image 135). Tortuosity and mild ectasia of the left ICA distal to the bulb but no additional stenosis to the skull base. Vertebral arteries: Aberrancy origin of the right subclavian artery with calcified plaque. No significant proximal subclavian stenosis. Calcified plaque at the right vertebral artery origin appears moderate to severe with patent but asymmetrically decreased enhancement of the right vertebral artery when compared to the left throughout the neck and to the skull base. No additional cervical right vertebral artery stenosis. No significant proximal left subclavian artery stenosis despite calcified plaque. At least moderate stenosis of the left vertebral artery origin due to calcified plaque. Left vertebral artery appears dominant and demonstrates superior enhancement versus the right. No additional cervical left vertebral stenosis. CTA HEAD Posterior circulation: Dominant distal left vertebral artery with patent left PICA origin. Asymmetrically decreased enhancement of the right vertebral artery V4 segment, although the right PICA origin remains patent. The distal right V4 appears occluded proximal to the vertebrobasilar junction (series 6, image 66). Patent basilar artery without stenosis. Patent SCA origins. Normal left PCA origin. Fetal type right PCA origin. Left posterior communicating artery is diminutive. The bilateral PCA branches appear patent  with mild irregularity. Anterior circulation: Both ICA siphons are patent. There is moderate bilateral calcified plaque with tortuous cavernous segments. There is mild cavernous left ICA stenosis. There is mild to moderate stenosis of the right ICA siphon proximal cavernous and proximal supraclinoid segments. Normal ophthalmic and posterior communicating artery origins. Patent carotid termini. Patent MCA and left ACA origins. The left ACA is dominant and the right is diminutive or absent. The anterior communicating artery is ectatic. The bilateral ACA branches are patent with mild irregularity. The right MCA M1 segment, bifurcation, and right MCA branches are patent with mild irregularity. The left MCA M1 segment is patent. At the left MCA trifurcation and anterior division M2 branch is occluded as seen on series 11, image 137. Of the patent M2 branches, there are M3 branch occlusions demonstrated on series 14, image 29. Venous sinuses: Insufficient venous contrast. Anatomic variants: Dominant left vertebral artery. Dominant left ACA A1 segment. Review of the MIP images confirms the above findings IMPRESSION: 1. CTP estimates a middle Left MCA territory core infarct of 19 mL but only modest additional left MCA territory penumbra resulting in a relatively small mismatch estimated at 14 mL. 2. 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 Patent Left ICA and Left M1. 3. The  above was discussed by telephone with Dr. Kerney Elbe on 10/10/2017 at 0906 hours. 4. Superimposed Left ICA calcified atherosclerosis with estimated 60% stenosis distal Left ICA bulb. Up to moderate contralateral right ICA siphon stenosis due to calcified plaque. 5. Dominant Left vertebral artery with mild to moderate origin stenosis. Patent basilar artery without stenosis. 6. Decreased enhancement of the Right Vertebral Artery relative to the left appears related to moderate to severe origin stenosis and occlusion of the distal right V4 segment.  The right PICA origin remains patent. 7. Aortic Atherosclerosis (ICD10-I70.0) and Emphysema (ICD10-J43.9). Electronically Signed   By: Genevie Ann M.D.   On: 10/10/2017 09:30   Ct Angio Neck W Or Wo Contrast  Result Date: 10/10/2017 CLINICAL DATA:  84 year old male with aphasia.  Code stroke. EXAM: CT ANGIOGRAPHY HEAD AND NECK CT PERFUSION BRAIN TECHNIQUE: Multidetector CT imaging of the head and neck was performed using the standard protocol during bolus administration of intravenous contrast. Multiplanar CT image reconstructions and MIPs were obtained to evaluate the vascular anatomy. Carotid stenosis measurements (when applicable) are obtained utilizing NASCET criteria, using the distal internal carotid diameter as the denominator. Multiphase CT imaging of the brain was performed following IV bolus contrast injection. Subsequent parametric perfusion maps were calculated using RAPID software. CONTRAST:  149m ISOVUE-370 IOPAMIDOL (ISOVUE-370) INJECTION 76% COMPARISON:  Noncontrast head CT 0829 hours today. Cervical spine MRI 11/24/2011. FINDINGS: CT Brain Perfusion Findings: ASPECTS 10 at 0829 hours today. CBF (<30%) Volume: 19 milliliters Perfusion (Tmax>6.0s) volume: 33 milliliters Mismatch Volume: 14 milliliters Infarction Location:Middle left MCA territory/operculum CTA NECK Skeleton: No acute osseous abnormality identified. Generalized cervical spine degeneration. Upper chest: Small volume retained secretions in the visible trachea. Respiratory motion artifact in the lung apices but evidence of emphysema in the right lung apex. Incidental azygos fissure (normal variant). Other neck: Small volume retained secretions in the hypopharynx. No neck mass or lymphadenopathy. Aortic arch: Aberrancy origin of the right subclavian artery, normal variant. Moderate Calcified aortic atherosclerosis. Right carotid system: No proximal right CCA stenosis despite soft plaque. Calcified plaque at the right carotid bifurcation  and right ICA bulb with no significant stenosis. Moderately tortuous mid cervical right ICA with calcified plaque and a kinked appearance, but otherwise no stenosis to the skull base. Left carotid system: Left CCA origin atherosclerosis without stenosis. Fairly bulky left carotid bifurcation calcified plaque tracking into the left ICA origin and bulb. Stenosis at the distal bulb up to 60 % with respect to the distal vessel (series 11, image 135). Tortuosity and mild ectasia of the left ICA distal to the bulb but no additional stenosis to the skull base. Vertebral arteries: Aberrancy origin of the right subclavian artery with calcified plaque. No significant proximal subclavian stenosis. Calcified plaque at the right vertebral artery origin appears moderate to severe with patent but asymmetrically decreased enhancement of the right vertebral artery when compared to the left throughout the neck and to the skull base. No additional cervical right vertebral artery stenosis. No significant proximal left subclavian artery stenosis despite calcified plaque. At least moderate stenosis of the left vertebral artery origin due to calcified plaque. Left vertebral artery appears dominant and demonstrates superior enhancement versus the right. No additional cervical left vertebral stenosis. CTA HEAD Posterior circulation: Dominant distal left vertebral artery with patent left PICA origin. Asymmetrically decreased enhancement of the right vertebral artery V4 segment, although the right PICA origin remains patent. The distal right V4 appears occluded proximal to the vertebrobasilar junction (series 6, image 66).  Patent basilar artery without stenosis. Patent SCA origins. Normal left PCA origin. Fetal type right PCA origin. Left posterior communicating artery is diminutive. The bilateral PCA branches appear patent with mild irregularity. Anterior circulation: Both ICA siphons are patent. There is moderate bilateral calcified plaque  with tortuous cavernous segments. There is mild cavernous left ICA stenosis. There is mild to moderate stenosis of the right ICA siphon proximal cavernous and proximal supraclinoid segments. Normal ophthalmic and posterior communicating artery origins. Patent carotid termini. Patent MCA and left ACA origins. The left ACA is dominant and the right is diminutive or absent. The anterior communicating artery is ectatic. The bilateral ACA branches are patent with mild irregularity. The right MCA M1 segment, bifurcation, and right MCA branches are patent with mild irregularity. The left MCA M1 segment is patent. At the left MCA trifurcation and anterior division M2 branch is occluded as seen on series 11, image 137. Of the patent M2 branches, there are M3 branch occlusions demonstrated on series 14, image 29. Venous sinuses: Insufficient venous contrast. Anatomic variants: Dominant left vertebral artery. Dominant left ACA A1 segment. Review of the MIP images confirms the above findings IMPRESSION: 1. CTP estimates a middle Left MCA territory core infarct of 19 mL but only modest additional left MCA territory penumbra resulting in a relatively small mismatch estimated at 14 mL. 2. 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 Patent Left ICA and Left M1. 3. The above was discussed by telephone with Dr. Kerney Elbe on 10/10/2017 at 0906 hours. 4. Superimposed Left ICA calcified atherosclerosis with estimated 60% stenosis distal Left ICA bulb. Up to moderate contralateral right ICA siphon stenosis due to calcified plaque. 5. Dominant Left vertebral artery with mild to moderate origin stenosis. Patent basilar artery without stenosis. 6. Decreased enhancement of the Right Vertebral Artery relative to the left appears related to moderate to severe origin stenosis and occlusion of the distal right V4 segment. The right PICA origin remains patent. 7. Aortic Atherosclerosis (ICD10-I70.0) and Emphysema (ICD10-J43.9).  Electronically Signed   By: Genevie Ann M.D.   On: 10/10/2017 09:30   Mr Brain Wo Contrast  Result Date: 10/11/2017 CLINICAL DATA:  84 y/o  M; stroke for follow-up. EXAM: MRI HEAD WITHOUT CONTRAST TECHNIQUE: Multiplanar, multiecho pulse sequences of the brain and surrounding structures were obtained without intravenous contrast. COMPARISON:  10/11/2017 MRI head.  10/10/2017 CT head and CTA head. FINDINGS: Brain: Area of reduced diffusion within the left lateral frontal lobe extending into the mid insula compatible with acute/early subacute infarction. Additional small focus of acute/early subacute infarction within the left occipital temporal junction. No associated hemorrhage or mass effect. Small chronic infarcts are present within bilateral cerebellar hemispheres, left thalamus, and left anterior putamen. Moderate chronic microvascular ischemic changes and parenchymal volume loss of the brain for age. Punctate foci of susceptibility hypointensity are present in right cerebellar hemisphere and left frontal periventricular white matter compatible with hemosiderin deposition of chronic microhemorrhage. No hydrocephalus, extra-axial collection, herniation, or effacement of basilar cisterns. Vascular: Normal flow voids. Skull and upper cervical spine: Normal marrow signal. Sinuses/Orbits: Negative.  Bilateral intra-ocular lens replacement. Other: None. IMPRESSION: Acute/early subacute infarction involving the left lateral frontal lobe and mid insula. Additional small focus of acute/early subacute infarction within the left occipitotemporal junction. No associated hemorrhage or mass effect. Electronically Signed   By: Kristine Garbe M.D.   On: 10/11/2017 03:31   Ct Cerebral Perfusion W Contrast  Result Date: 10/10/2017 CLINICAL DATA:  84 year old male with  aphasia.  Code stroke. EXAM: CT ANGIOGRAPHY HEAD AND NECK CT PERFUSION BRAIN TECHNIQUE: Multidetector CT imaging of the head and neck was performed using  the standard protocol during bolus administration of intravenous contrast. Multiplanar CT image reconstructions and MIPs were obtained to evaluate the vascular anatomy. Carotid stenosis measurements (when applicable) are obtained utilizing NASCET criteria, using the distal internal carotid diameter as the denominator. Multiphase CT imaging of the brain was performed following IV bolus contrast injection. Subsequent parametric perfusion maps were calculated using RAPID software. CONTRAST:  139m ISOVUE-370 IOPAMIDOL (ISOVUE-370) INJECTION 76% COMPARISON:  Noncontrast head CT 0829 hours today. Cervical spine MRI 11/24/2011. FINDINGS: CT Brain Perfusion Findings: ASPECTS 10 at 0829 hours today. CBF (<30%) Volume: 19 milliliters Perfusion (Tmax>6.0s) volume: 33 milliliters Mismatch Volume: 14 milliliters Infarction Location:Middle left MCA territory/operculum CTA NECK Skeleton: No acute osseous abnormality identified. Generalized cervical spine degeneration. Upper chest: Small volume retained secretions in the visible trachea. Respiratory motion artifact in the lung apices but evidence of emphysema in the right lung apex. Incidental azygos fissure (normal variant). Other neck: Small volume retained secretions in the hypopharynx. No neck mass or lymphadenopathy. Aortic arch: Aberrancy origin of the right subclavian artery, normal variant. Moderate Calcified aortic atherosclerosis. Right carotid system: No proximal right CCA stenosis despite soft plaque. Calcified plaque at the right carotid bifurcation and right ICA bulb with no significant stenosis. Moderately tortuous mid cervical right ICA with calcified plaque and a kinked appearance, but otherwise no stenosis to the skull base. Left carotid system: Left CCA origin atherosclerosis without stenosis. Fairly bulky left carotid bifurcation calcified plaque tracking into the left ICA origin and bulb. Stenosis at the distal bulb up to 60 % with respect to the distal vessel  (series 11, image 135). Tortuosity and mild ectasia of the left ICA distal to the bulb but no additional stenosis to the skull base. Vertebral arteries: Aberrancy origin of the right subclavian artery with calcified plaque. No significant proximal subclavian stenosis. Calcified plaque at the right vertebral artery origin appears moderate to severe with patent but asymmetrically decreased enhancement of the right vertebral artery when compared to the left throughout the neck and to the skull base. No additional cervical right vertebral artery stenosis. No significant proximal left subclavian artery stenosis despite calcified plaque. At least moderate stenosis of the left vertebral artery origin due to calcified plaque. Left vertebral artery appears dominant and demonstrates superior enhancement versus the right. No additional cervical left vertebral stenosis. CTA HEAD Posterior circulation: Dominant distal left vertebral artery with patent left PICA origin. Asymmetrically decreased enhancement of the right vertebral artery V4 segment, although the right PICA origin remains patent. The distal right V4 appears occluded proximal to the vertebrobasilar junction (series 6, image 66). Patent basilar artery without stenosis. Patent SCA origins. Normal left PCA origin. Fetal type right PCA origin. Left posterior communicating artery is diminutive. The bilateral PCA branches appear patent with mild irregularity. Anterior circulation: Both ICA siphons are patent. There is moderate bilateral calcified plaque with tortuous cavernous segments. There is mild cavernous left ICA stenosis. There is mild to moderate stenosis of the right ICA siphon proximal cavernous and proximal supraclinoid segments. Normal ophthalmic and posterior communicating artery origins. Patent carotid termini. Patent MCA and left ACA origins. The left ACA is dominant and the right is diminutive or absent. The anterior communicating artery is ectatic. The  bilateral ACA branches are patent with mild irregularity. The right MCA M1 segment, bifurcation, and right MCA branches are patent with  mild irregularity. The left MCA M1 segment is patent. At the left MCA trifurcation and anterior division M2 branch is occluded as seen on series 11, image 137. Of the patent M2 branches, there are M3 branch occlusions demonstrated on series 14, image 29. Venous sinuses: Insufficient venous contrast. Anatomic variants: Dominant left vertebral artery. Dominant left ACA A1 segment. Review of the MIP images confirms the above findings IMPRESSION: 1. CTP estimates a middle Left MCA territory core infarct of 19 mL but only modest additional left MCA territory penumbra resulting in a relatively small mismatch estimated at 14 mL. 2. 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 Patent Left ICA and Left M1. 3. The above was discussed by telephone with Dr. Kerney Elbe on 10/10/2017 at 0906 hours. 4. Superimposed Left ICA calcified atherosclerosis with estimated 60% stenosis distal Left ICA bulb. Up to moderate contralateral right ICA siphon stenosis due to calcified plaque. 5. Dominant Left vertebral artery with mild to moderate origin stenosis. Patent basilar artery without stenosis. 6. Decreased enhancement of the Right Vertebral Artery relative to the left appears related to moderate to severe origin stenosis and occlusion of the distal right V4 segment. The right PICA origin remains patent. 7. Aortic Atherosclerosis (ICD10-I70.0) and Emphysema (ICD10-J43.9). Electronically Signed   By: Genevie Ann M.D.   On: 10/10/2017 09:30   Dg Chest Port 1 View  Result Date: 10/11/2017 CLINICAL DATA:  84 year old male with fever.  Initial encounter. EXAM: PORTABLE CHEST 1 VIEW COMPARISON:  07/23/2015 chest x-ray. FINDINGS: Pulmonary vascular congestion/pulmonary edema. Superimposed patchy consolidation left upper lobe and possibly right lung base may represent infectious process given patient's  history of fever. Heart size top-normal. Calcified aorta. IMPRESSION: Pulmonary vascular congestion/pulmonary edema. Superimposed patchy consolidation left upper lobe and possibly right lung base may represent infectious process given patient's history of fever. Follow-up until clearance recommended to exclude underlying mass. Aortic Atherosclerosis (ICD10-I70.0). Electronically Signed   By: Genia Del M.D.   On: 10/11/2017 11:15   Ct Head Code Stroke Wo Contrast  Result Date: 10/10/2017 CLINICAL DATA:  Code stroke.  84 year old male with aphasia. EXAM: CT HEAD WITHOUT CONTRAST TECHNIQUE: Contiguous axial images were obtained from the base of the skull through the vertex without intravenous contrast. COMPARISON:  Cervical spine MRI 11/24/2011. FINDINGS: Brain: No acute intracranial hemorrhage identified. No midline shift, mass effect, or evidence of intracranial mass lesion. Patchy bilateral cerebral white matter hypodensity, relatively mild for age. Some involvement of the basal ganglia, more so on the left. No cortically based acute hemispheric infarct identified, but there is patchy age indeterminate hypodensity in the central right cerebellum (series 7, image 7 and coronal image 54). No cortical encephalomalacia identified. Vascular: Calcified atherosclerosis at the skull base. No suspicious intracranial vascular hyperdensity. Skull: No acute osseous abnormality identified. Sinuses/Orbits: Well pneumatized. Other: Postoperative changes to both globes. No acute orbit or scalp soft tissue finding identified. ASPECTS [facility] Stroke Program Early CT Score) - Ganglionic level infarction (caudate, lentiform nuclei, internal capsule, insula, M1-M3 cortex): 7 - Supraganglionic infarction (M4-M6 cortex): 3 Total score (0-10 with 10 being normal): 10 IMPRESSION: 1. Age indeterminate mild patchy hypodensity in the right central cerebellum. No intracranial hemorrhage or acute cortically based infarct identified. 2.  ASPECTS is 10. 3. These results were communicated to Dr. Cheral Marker at 8:39 amon 5/6/2019by text page via the [facility] messaging system. Electronically Signed   By: Genevie Ann M.D.   On: 10/10/2017 08:40    2D echocardiogram - Left ventricle: The  cavity size was normal. Wall thickness was increased in a pattern of severe LVH. Systolic function was mildly reduced. The estimated ejection fraction was in the range of 45% to 50%. There is akinesis of the basallateral and inferior myocardium. Features are consistent with a pseudonormal left ventricular filling pattern, with concomitant abnormal relaxation and increased filling pressure (grade 2 diastolic dysfunction). - Aortic valve: There was trivial regurgitation. - Mitral valve: Calcified annulus. Impressions:   Small area of akinesis in the basal inferior and lateral walls with overall mildly reduced LV systolic function; severe LVH; moderate diastolic dysfunction; trace AI.  Lower Extremity Doppler Negative for deep and superficial vein thrombosis.   PHYSICAL EXAM HEENT: [place]/AT Lungs: mouth breathing. Breath sounds cleat to auscultation.  Cardiac: HRR Ext: Noncyanotic Neuro - awake alert, nonverbal, able to follow commands of close open eyes and on both hands showing fingers and fist. However, not following with other simple commands. PERRL, left gaze preference but able to cross midline, blinking to visual threat inconsistent. Able to track. No significant facial asymmetry. Tongue middle in mouth. LUE 4/5, RUE 3/5. LLE withdraw against gravity, RLE 2+/5 on pain stimulation.    ASSESSMENT/PLAN Ronald Benitez is a 84 y.o. male with history of hypertension, hyperlipidemia, coronary artery disease, chronic kidney disease, diabetes, Parkinson's, first-degree heart block and known PFO found down in the bathroom presenting with right hemiparesis and global aphasia.   Stroke:   Left lateral frontal/mid insular infarct and small occipital infarct,  appears embolic secondary to unknown source   Code Stroke CT head age indeterminate patchy hypodensity right central cerebellum.  ASPECTS 10.     CTA head & neck L MCA M2 and M3 branch occlusions. L ICA 60% stenosis.  LVA mild to moderate stenosis.  R VA appears moderate to severe origin stenosis.  RV for occlusion.  Aortic atherosclerosis.  Emphysema.  CT perfusion L MCA infarct of 19 mL with only 14 mL penumbra  MRI L lateral frontal and mid insular infarct.  Small left occipital infarct.  2D Echo  EF 45-50%. No source of embolus.  Grade 2 diastolic dysfunction  Known PFO - LE doppler negative   Consider 30 day cardiac event monitoring to look for atrial fibrillation as source of stroke if family would like aggressive care. (Seen by cardiology.  EKG done by EMS does not show atrial fibrillation or flutter.  No evidence of other atrial arrhythmias other than PAC.)  LDL 61  HgbA1c 8.1  Lovenox 30 mg sq daily for VTE prophylaxis  NPO. ST following. Failed MBSS today. Discussed panda vs PEG vs comfort with wife and daughter. Will hold on panda/TF today until she talks to family.   aspirin 81 mg daily and clopidogrel 75 mg daily prior to admission, now on aspirin 300 mg suppository daily.   palliative care following. Wife deciding on tube feedings/plan of care  Therapy recommendations:  pending  Disposition:  Pending family decision for goal of care - palliative care on board  Hypertension  Stable . Long-term BP goal normotensive  Hyperlipidemia  Home meds:  zocor 80  LDL 61, goal < 70  Consider to continue statin once po access  Continue statin at discharge  Diabetes type II  HgbA1c 8.1, goal < 7.0  Uncontrolled  CBG monitoring  SSI  Other Stroke Risk Factors  Advanced age  Murmur cigarette smoker, quit about 4 years ago   Coronary artery disease - MI, angioplasty and stents  Known PFO  Other  Active Problems  Parkinson's disease, followed by Dr.  Carles Collet  chronic kidney disease stage III creatinine 2.5->2.3  Elevated troponin likely related to acute stroke.  Seen by cardiology  Agitation   Hospital day # 4  Ronald Hawking, MD PhD Stroke Neurology 10/15/2017 12:27 AM      To contact Stroke Continuity provider, please refer to [email or web address] After hours, contact General Neurology

## 2017-10-15 DIAGNOSIS — R1312 Dysphagia, oropharyngeal phase: Secondary | ICD-10-CM

## 2017-10-15 DIAGNOSIS — I63412 Cerebral infarction due to embolism of left middle cerebral artery: Secondary | ICD-10-CM

## 2017-10-15 LAB — GLUCOSE, CAPILLARY
GLUCOSE-CAPILLARY: 115 mg/dL — AB (ref 65–99)
GLUCOSE-CAPILLARY: 143 mg/dL — AB (ref 65–99)
Glucose-Capillary: 125 mg/dL — ABNORMAL HIGH (ref 65–99)
Glucose-Capillary: 127 mg/dL — ABNORMAL HIGH (ref 65–99)
Glucose-Capillary: 123 mg/dL — ABNORMAL HIGH (ref 65–99)
Glucose-Capillary: 124 mg/dL — ABNORMAL HIGH (ref 65–99)

## 2017-10-15 LAB — BASIC METABOLIC PANEL
ANION GAP: 8 (ref 5–15)
BUN: 36 mg/dL — ABNORMAL HIGH (ref 6–20)
CHLORIDE: 113 mmol/L — AB (ref 101–111)
CO2: 24 mmol/L (ref 22–32)
Calcium: 9.3 mg/dL (ref 8.9–10.3)
Creatinine, Ser: 1.98 mg/dL — ABNORMAL HIGH (ref 0.61–1.24)
GFR calc non Af Amer: 29 mL/min — ABNORMAL LOW (ref >60)
GFR, EST AFRICAN AMERICAN: 33 mL/min — AB (ref >60)
Glucose, Bld: 134 mg/dL — ABNORMAL HIGH (ref 65–99)
POTASSIUM: 4 mmol/L (ref 3.5–5.1)
Sodium: 145 mmol/L (ref 135–145)

## 2017-10-15 MED ORDER — LORAZEPAM 2 MG/ML IJ SOLN
0.5000 mg | Freq: Every day | INTRAMUSCULAR | Status: DC
  Administered 2017-10-15 – 2017-10-18 (×4): 0.5 mg via INTRAVENOUS
  Filled 2017-10-15 (×3): qty 1

## 2017-10-15 MED ORDER — LORAZEPAM 2 MG/ML IJ SOLN: 0.2500 mg | Freq: Every evening | INTRAMUSCULAR | Status: DC | PRN

## 2017-10-15 NOTE — Progress Notes (Addendum)
Daily Progress Note   Patient Name: Ronald Benitez       Date: 10/15/2017 DOB: 10-01-1928  Age: 84 y.o. MRN#: [id number] Attending Physician: Elmarie Shiley, MD Primary Care Physician: [name], MD Admit Date: 10/10/2017  Reason for Consultation/Follow-up: Establishing goals of care, Non pain symptom management, Pain control and Psychosocial/spiritual support  Subjective: Patient seen, chart reviewed.  Patient seen by speech and language services on 10/14/2017 and was able to take some ice chips but he did exhibit a delayed cough.  He is still having fluctuating levels of consciousness, periods of being more alert able to follow commands alternating with restlessness and lethargy  Held another family meeting including sons Ronald Benitez, and Ronald Benitez , daughter Ronald Benitez, as well as patient's spouse Ronald Benitez.  I also spoke to patient's granddaughter, Ronald Benitez, who is in nursing school, at length regarding risks and benefits of artificial feeding  Length of Stay: 5  Current Medications: Scheduled Meds:  . aspirin  300 mg Rectal Daily   Or  . aspirin  325 mg Oral Daily  . atorvastatin  80 mg Oral q1800  . chlorhexidine  15 mL Mouth Rinse BID  . enoxaparin (LOVENOX) injection  30 mg Subcutaneous Q24H  . insulin aspart  0-9 Units Subcutaneous Q4H  . mouth rinse  15 mL Mouth Rinse BID    Continuous Infusions: . ampicillin-sulbactam (UNASYN) IV 3 g (10/15/17 1129)  . dextrose 1,000 mL (10/15/17 0108)    PRN Meds: acetaminophen **OR** acetaminophen (TYLENOL) oral liquid 160 mg/5 mL **OR** acetaminophen, bisacodyl, glycopyrrolate, LORazepam  Physical Exam  Constitutional:  Ill-appearing elderly man; nonverbal  HENT:  Head: Normocephalic and atraumatic.  Cardiovascular: Normal rate.    Pulmonary/Chest: Effort normal.  Decreased upper airway secretions as compared to 10/14/2017  Abdominal: Soft.  Neurological: He is alert.  Patient is intermittently able to follow simple commands He is nonverbal but pointing and squeezing family's hands sometimes appearing purposeful  Skin: Skin is warm and dry.  Psychiatric:  Affect anxious, restless  Nursing note and vitals reviewed.           Vital Signs: BP (!) 158/72 (BP Location: Left Arm)   Pulse 65   Temp 98 F (36.7 C) (Axillary)   Resp 18   Ht 5\' 11"  (1.803 m)   Wt 76.8 kg (169  lb 5 oz)   SpO2 100%   BMI 23.61 kg/m  SpO2: SpO2: 100 % O2 Device: O2 Device: Nasal Cannula O2 Flow Rate: O2 Flow Rate (L/min): 2 L/min  Intake/output summary:   Intake/Output Summary (Last 24 hours) at 10/15/2017 1252 Last data filed at 10/15/2017 4259 Gross per 24 hour  Intake -  Output 600 ml  Net -600 ml   LBM: Last BM Date: 10/13/17 Baseline Weight: Weight: 76.8 kg (169 lb 5 oz) Most recent weight: Weight: 76.8 kg (169 lb 5 oz)       Palliative Assessment/Data:    Flowsheet Rows     Most Recent Value  Intake Tab  Referral Department  Hospitalist  Unit at Time of Referral  Med/Surg Unit  Palliative Care Primary Diagnosis  Neurology  Palliative Care Type  New Palliative care  Reason for referral  Clarify Goals of Care  Date first seen by Palliative Care  10/13/17  Clinical Assessment  Palliative Performance Scale Score  20%  Psychosocial & Spiritual Assessment  Palliative Care Outcomes  Patient/Family meeting held?  Yes  Who was at the meeting?  wife, four adult children  Palliative Care Outcomes  Clarified goals of care, Provided end of life care assistance, Provided psychosocial or spiritual support, Improved non-pain symptom therapy, Counseled regarding hospice, ACP counseling assistance      Patient Active Problem List   Diagnosis Date Noted  . Palliative care by specialist   . Goals of care,  counseling/discussion   . Agitation   . Increased oropharyngeal secretions   . Parkinson's disease ([place])   . Dysphagia   . Acute ischemic stroke ([place])   . CVA (cerebral vascular accident) ([place]) 10/10/2017  . Elevated troponin   . Abnormal CT of the abdomen 12/06/2016  . Change in bowel habits 12/06/2016  . CN (constipation) 12/06/2016  . COPD, mild ([place]) 04/09/2014  . Unstable angina ([place]) 01/17/2014  . Mitral regurgitation   . GERD (gastroesophageal reflux disease)   . DM (diabetes mellitus) ([employer])   . Asymptomatic LV dysfunction   . CAD (coronary artery disease) 01/15/2014  . Abnormal nuclear cardiac imaging test 01/14/2014  . Chronic renal disease, stage III ([place]) 01/14/2014  . Sinus bradycardia- HR 43 01/14/2014  . Essential hypertension 11/16/2013  . Hyperlipidemia 11/16/2013  . Depressive disorder, not elsewhere classified 05/08/2013    Palliative Care Assessment & Plan   Patient Profile: 84 y.o.malewith past medical history of Parkinson's disease, DM, HTN, HLD, MI s/p stents, hiatal hernia, GERD, depression, COPD, CKD, CAD, BPH, arthritis, first degree heart blockadmitted on 5/6/2019after he was found down by his wife around 7am. Patient noted to have right sided hemiplegia and neglect. Neurology following.MRI revealed left lateral frontal/mid insular infarct and small occipital infarct. Patient with right upper and lower extremity weakness and global aphasia.Cardiology following for elevated troponin likely demand ischemia. Echo revealed mild LV dysfunction with EF 45-50% with grade 2 diastolic dysfunction. Not a candidate for cardiac cath secondary to acute CVA. Cardiology is recommending aspirin and high dose statin.Patient failed speech swallow evaluation on 5/8 with high risk for aspiration. Family considering PEG versus short term feeding tube.   Palliative medicine consultation for goals of care.    Assessment: Family members present at today's meeting as  noted above, including wife 2 of the 3 sons, and daughter were all in agreement about no artificial feeding either temporary via core track or PEG tube.  Patient's spouse did stop short of allowing comfort feeds until  we meet with patient's other son which we have scheduled for 10/16/2017 at 11 AM  Recommendations/Plan:  Pain: Patient is restless and I question whether he is having unmanaged pain even mild pain that could be relieved with Tylenol.  Will schedule Tylenol and monitor for need for alternate agents  Agitation: Family is requesting that patient receive Ativan at bedtime as his agitation worsens through the evening I am in agreement with this and will schedule this starting tonight  We will have another family meeting on 10/16/2017 at 23 AM to hopefully address comfort feeds.  If family is heading that this direction he may qualify for residential hospice.  We did talk about that as an option and family was receptive to this  DNR/DNI    Code Status:    Code Status Orders  (From admission, onward)        Start     Ordered   10/12/17 1040  Do not attempt resuscitation (DNR)  Continuous    Question Answer Comment  In the event of cardiac or respiratory ARREST Do not call a "code blue"   In the event of cardiac or respiratory ARREST Do not perform Intubation, CPR, defibrillation or ACLS   In the event of cardiac or respiratory ARREST Use medication by any route, position, wound care, and other measures to relive pain and suffering. May use oxygen, suction and manual treatment of airway obstruction as needed for comfort.      10/12/17 1039    Code Status History    Date Active Date Inactive Code Status Order ID Comments User Context   10/10/2017 1235 10/12/2017 1039 Full Code [id number]  Karmen Bongo, MD ED   01/16/2014 1754 01/17/2014 1353 Full Code [id number]  Troy Sine, MD Inpatient   01/15/2014 1639 01/16/2014 1754 Full Code [id number]  Lorretta Harp, MD Inpatient    01/14/2014 1644 01/15/2014 1639 Full Code [id number]  Erlene Quan, PA-C Inpatient       Prognosis:   < 6 months, or even days to weeks given high risk of aspiration in the setting of progressive Parkinson's disease, new CVA, multisystem failure with chronic kidney disease, COPD, diabetes   Discharge Planning:  To Be Determined    Thank you for allowing the Palliative Medicine Team to assist in the care of this patient.   Time In: 1100 Time Out: 1230 Total Time 90 min Prolonged Time Billed  yes       Greater than 50%  of this time was spent counseling and coordinating care related to the above assessment and plan.  Dory Horn, NP  Please contact Palliative Medicine Team phone at [phone] for questions and concerns.

## 2017-10-15 NOTE — Progress Notes (Signed)
Pharmacy Antibiotic Note  Ronald Benitez is a 84 y.o. male admitted on 10/10/2017 with acute CVA.   Pharmacy has been consulted on 10/12/17 for Unasyn dosing for aspiration pneumonia.  Dosing remains appropriate for renal function with estimated CrCl~25-30 ml/min.   Plan: Unasyn 3 g IV q12 hours Will continue to follow renal function, culture results, LOT, and antibiotic de-escalation plans    Height: 5\' 11"  (180.3 cm) Weight: 169 lb 5 oz (76.8 kg) IBW/kg (Calculated) : 75.3  Temp (24hrs), Avg:97.9 F (36.6 C), Min:97.8 F (36.6 C), Max:98.1 F (36.7 C)  Recent Labs  Lab 10/10/17 0821 10/10/17 0834 10/12/17 0534 10/13/17 0653 10/15/17 0334  WBC 9.2  --  8.4 8.2  --   CREATININE 2.62* 2.50* 2.35* 2.30* 1.98*    Estimated Creatinine Clearance: 28 mL/min (A) (by C-G formula based on SCr of 1.98 mg/dL (H)).    No Known Allergies  Antimicrobials this admission: Unasyn 5/8>>  Dose adjustments this admission:   Microbiology results: none  Thank you for allowing pharmacy to be a part of this patient's care.  Alycia Rossetti, PharmD, BCPS Clinical Pharmacist Pager: [phone] Clinical phone for 10/15/2017 from 7a-3:30p: [phone] If after 3:30p, please call main pharmacy at: [phone] 10/15/2017 2:43 PM

## 2017-10-15 NOTE — Progress Notes (Signed)
STROKE TEAM PROGRESS NOTE   INTERVAL HISTORY Patient son in law at bedside, family meeting is scheduled with palliative team today, pt is lying in bed comfortable, just got IV acetaminophen, nurse stated that he has not slept in couple nights. Pt is moving his R side more today.   Vitals:   10/14/17 1710 10/14/17 1930 10/14/17 2332 10/15/17 0357  BP: (!) 160/82 (!) 169/82 (!) 171/85 (!) 167/90  Pulse: 72 82 79 79  Resp: 17 (!) 22 (!) 22 (!) 22  Temp: 98.1 F (36.7 C) 97.8 F (36.6 C) 97.9 F (36.6 C) 98 F (36.7 C)  TempSrc: Oral Oral Oral Oral  SpO2: 96% 99% 97% 99%  Weight:      Height:        CBC:  CBC Latest Ref Rng & Units 10/13/2017 10/12/2017 10/10/2017  WBC 4.0 - 10.5 K/uL 8.2 8.4 -  Hemoglobin 13.0 - 17.0 g/dL 11.1(L) 11.2(L) 12.2(L)  Hematocrit 39.0 - 52.0 % 35.0(L) 35.1(L) 36.0(L)  Platelets 150 - 400 K/uL 205 212 -     Comprehensive Metabolic Panel:   CMP Latest Ref Rng & Units 10/15/2017 10/13/2017 10/12/2017  Glucose 65 - 99 mg/dL 134(H) 121(H) 136(H)  BUN 6 - 20 mg/dL 36(H) 38(H) 41(H)  Creatinine 0.61 - 1.24 mg/dL 1.98(H) 2.30(H) 2.35(H)  Sodium 135 - 145 mmol/L 145 144 143  Potassium 3.5 - 5.1 mmol/L 4.0 4.0 4.1  Chloride 101 - 111 mmol/L 113(H) 113(H) 112(H)  CO2 22 - 32 mmol/L 24 22 21(L)  Calcium 8.9 - 10.3 mg/dL 9.3 9.1 9.1  Total Protein 6.5 - 8.1 g/dL - - -  Total Bilirubin 0.3 - 1.2 mg/dL - - -  Alkaline Phos 38 - 126 U/L - - -  AST 15 - 41 U/L - - -  ALT 17 - 63 U/L - - -   Lipid Panel:     Component Value Date/Time   CHOL 120 10/11/2017 0621   TRIG 76 10/11/2017 0621   HDL 44 10/11/2017 0621   CHOLHDL 2.7 10/11/2017 0621   VLDL 15 10/11/2017 0621   LDLCALC 61 10/11/2017 0621   HgbA1c:  Lab Results  Component Value Date   HGBA1C 8.1 (H) 10/10/2017   Urine Drug Screen:     Component Value Date/Time   LABOPIA NONE DETECTED 10/10/2017 0821   COCAINSCRNUR NONE DETECTED 10/10/2017 0821   LABBENZ NONE DETECTED 10/10/2017 0821   AMPHETMU NONE  DETECTED 10/10/2017 0821   THCU NONE DETECTED 10/10/2017 0821   LABBARB NONE DETECTED 10/10/2017 0821    Alcohol Level     Component Value Date/Time   ETH <10 10/10/2017 1030    IMAGING I have personally reviewed the radiological images below and agree with the radiology interpretations.  Ct Angio Head W Or Wo Contrast  Result Date: 10/10/2017 CLINICAL DATA:  84 year old male with aphasia.  Code stroke. EXAM: CT ANGIOGRAPHY HEAD AND NECK CT PERFUSION BRAIN TECHNIQUE: Multidetector CT imaging of the head and neck was performed using the standard protocol during bolus administration of intravenous contrast. Multiplanar CT image reconstructions and MIPs were obtained to evaluate the vascular anatomy. Carotid stenosis measurements (when applicable) are obtained utilizing NASCET criteria, using the distal internal carotid diameter as the denominator. Multiphase CT imaging of the brain was performed following IV bolus contrast injection. Subsequent parametric perfusion maps were calculated using RAPID software. CONTRAST:  148m ISOVUE-370 IOPAMIDOL (ISOVUE-370) INJECTION 76% COMPARISON:  Noncontrast head CT 0829 hours today. Cervical spine MRI 11/24/2011. FINDINGS: CT  Brain Perfusion Findings: ASPECTS 10 at 0829 hours today. CBF (<30%) Volume: 19 milliliters Perfusion (Tmax>6.0s) volume: 33 milliliters Mismatch Volume: 14 milliliters Infarction Location:Middle left MCA territory/operculum CTA NECK Skeleton: No acute osseous abnormality identified. Generalized cervical spine degeneration. Upper chest: Small volume retained secretions in the visible trachea. Respiratory motion artifact in the lung apices but evidence of emphysema in the right lung apex. Incidental azygos fissure (normal variant). Other neck: Small volume retained secretions in the hypopharynx. No neck mass or lymphadenopathy. Aortic arch: Aberrancy origin of the right subclavian artery, normal variant. Moderate Calcified aortic  atherosclerosis. Right carotid system: No proximal right CCA stenosis despite soft plaque. Calcified plaque at the right carotid bifurcation and right ICA bulb with no significant stenosis. Moderately tortuous mid cervical right ICA with calcified plaque and a kinked appearance, but otherwise no stenosis to the skull base. Left carotid system: Left CCA origin atherosclerosis without stenosis. Fairly bulky left carotid bifurcation calcified plaque tracking into the left ICA origin and bulb. Stenosis at the distal bulb up to 60 % with respect to the distal vessel (series 11, image 135). Tortuosity and mild ectasia of the left ICA distal to the bulb but no additional stenosis to the skull base. Vertebral arteries: Aberrancy origin of the right subclavian artery with calcified plaque. No significant proximal subclavian stenosis. Calcified plaque at the right vertebral artery origin appears moderate to severe with patent but asymmetrically decreased enhancement of the right vertebral artery when compared to the left throughout the neck and to the skull base. No additional cervical right vertebral artery stenosis. No significant proximal left subclavian artery stenosis despite calcified plaque. At least moderate stenosis of the left vertebral artery origin due to calcified plaque. Left vertebral artery appears dominant and demonstrates superior enhancement versus the right. No additional cervical left vertebral stenosis. CTA HEAD Posterior circulation: Dominant distal left vertebral artery with patent left PICA origin. Asymmetrically decreased enhancement of the right vertebral artery V4 segment, although the right PICA origin remains patent. The distal right V4 appears occluded proximal to the vertebrobasilar junction (series 6, image 66). Patent basilar artery without stenosis. Patent SCA origins. Normal left PCA origin. Fetal type right PCA origin. Left posterior communicating artery is diminutive. The bilateral PCA  branches appear patent with mild irregularity. Anterior circulation: Both ICA siphons are patent. There is moderate bilateral calcified plaque with tortuous cavernous segments. There is mild cavernous left ICA stenosis. There is mild to moderate stenosis of the right ICA siphon proximal cavernous and proximal supraclinoid segments. Normal ophthalmic and posterior communicating artery origins. Patent carotid termini. Patent MCA and left ACA origins. The left ACA is dominant and the right is diminutive or absent. The anterior communicating artery is ectatic. The bilateral ACA branches are patent with mild irregularity. The right MCA M1 segment, bifurcation, and right MCA branches are patent with mild irregularity. The left MCA M1 segment is patent. At the left MCA trifurcation and anterior division M2 branch is occluded as seen on series 11, image 137. Of the patent M2 branches, there are M3 branch occlusions demonstrated on series 14, image 29. Venous sinuses: Insufficient venous contrast. Anatomic variants: Dominant left vertebral artery. Dominant left ACA A1 segment. Review of the MIP images confirms the above findings IMPRESSION: 1. CTP estimates a middle Left MCA territory core infarct of 19 mL but only modest additional left MCA territory penumbra resulting in a relatively small mismatch estimated at 14 mL. 2. 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 Patent  Left ICA and Left M1. 3. The above was discussed by telephone with Dr. Kerney Elbe on 10/10/2017 at 0906 hours. 4. Superimposed Left ICA calcified atherosclerosis with estimated 60% stenosis distal Left ICA bulb. Up to moderate contralateral right ICA siphon stenosis due to calcified plaque. 5. Dominant Left vertebral artery with mild to moderate origin stenosis. Patent basilar artery without stenosis. 6. Decreased enhancement of the Right Vertebral Artery relative to the left appears related to moderate to severe origin stenosis and occlusion of the  distal right V4 segment. The right PICA origin remains patent. 7. Aortic Atherosclerosis (ICD10-I70.0) and Emphysema (ICD10-J43.9). Electronically Signed   By: Genevie Ann M.D.   On: 10/10/2017 09:30   Ct Angio Neck W Or Wo Contrast  Result Date: 10/10/2017 CLINICAL DATA:  84 year old male with aphasia.  Code stroke. EXAM: CT ANGIOGRAPHY HEAD AND NECK CT PERFUSION BRAIN TECHNIQUE: Multidetector CT imaging of the head and neck was performed using the standard protocol during bolus administration of intravenous contrast. Multiplanar CT image reconstructions and MIPs were obtained to evaluate the vascular anatomy. Carotid stenosis measurements (when applicable) are obtained utilizing NASCET criteria, using the distal internal carotid diameter as the denominator. Multiphase CT imaging of the brain was performed following IV bolus contrast injection. Subsequent parametric perfusion maps were calculated using RAPID software. CONTRAST:  134m ISOVUE-370 IOPAMIDOL (ISOVUE-370) INJECTION 76% COMPARISON:  Noncontrast head CT 0829 hours today. Cervical spine MRI 11/24/2011. FINDINGS: CT Brain Perfusion Findings: ASPECTS 10 at 0829 hours today. CBF (<30%) Volume: 19 milliliters Perfusion (Tmax>6.0s) volume: 33 milliliters Mismatch Volume: 14 milliliters Infarction Location:Middle left MCA territory/operculum CTA NECK Skeleton: No acute osseous abnormality identified. Generalized cervical spine degeneration. Upper chest: Small volume retained secretions in the visible trachea. Respiratory motion artifact in the lung apices but evidence of emphysema in the right lung apex. Incidental azygos fissure (normal variant). Other neck: Small volume retained secretions in the hypopharynx. No neck mass or lymphadenopathy. Aortic arch: Aberrancy origin of the right subclavian artery, normal variant. Moderate Calcified aortic atherosclerosis. Right carotid system: No proximal right CCA stenosis despite soft plaque. Calcified plaque at the  right carotid bifurcation and right ICA bulb with no significant stenosis. Moderately tortuous mid cervical right ICA with calcified plaque and a kinked appearance, but otherwise no stenosis to the skull base. Left carotid system: Left CCA origin atherosclerosis without stenosis. Fairly bulky left carotid bifurcation calcified plaque tracking into the left ICA origin and bulb. Stenosis at the distal bulb up to 60 % with respect to the distal vessel (series 11, image 135). Tortuosity and mild ectasia of the left ICA distal to the bulb but no additional stenosis to the skull base. Vertebral arteries: Aberrancy origin of the right subclavian artery with calcified plaque. No significant proximal subclavian stenosis. Calcified plaque at the right vertebral artery origin appears moderate to severe with patent but asymmetrically decreased enhancement of the right vertebral artery when compared to the left throughout the neck and to the skull base. No additional cervical right vertebral artery stenosis. No significant proximal left subclavian artery stenosis despite calcified plaque. At least moderate stenosis of the left vertebral artery origin due to calcified plaque. Left vertebral artery appears dominant and demonstrates superior enhancement versus the right. No additional cervical left vertebral stenosis. CTA HEAD Posterior circulation: Dominant distal left vertebral artery with patent left PICA origin. Asymmetrically decreased enhancement of the right vertebral artery V4 segment, although the right PICA origin remains patent. The distal right V4 appears occluded proximal to  the vertebrobasilar junction (series 6, image 66). Patent basilar artery without stenosis. Patent SCA origins. Normal left PCA origin. Fetal type right PCA origin. Left posterior communicating artery is diminutive. The bilateral PCA branches appear patent with mild irregularity. Anterior circulation: Both ICA siphons are patent. There is moderate  bilateral calcified plaque with tortuous cavernous segments. There is mild cavernous left ICA stenosis. There is mild to moderate stenosis of the right ICA siphon proximal cavernous and proximal supraclinoid segments. Normal ophthalmic and posterior communicating artery origins. Patent carotid termini. Patent MCA and left ACA origins. The left ACA is dominant and the right is diminutive or absent. The anterior communicating artery is ectatic. The bilateral ACA branches are patent with mild irregularity. The right MCA M1 segment, bifurcation, and right MCA branches are patent with mild irregularity. The left MCA M1 segment is patent. At the left MCA trifurcation and anterior division M2 branch is occluded as seen on series 11, image 137. Of the patent M2 branches, there are M3 branch occlusions demonstrated on series 14, image 29. Venous sinuses: Insufficient venous contrast. Anatomic variants: Dominant left vertebral artery. Dominant left ACA A1 segment. Review of the MIP images confirms the above findings IMPRESSION: 1. CTP estimates a middle Left MCA territory core infarct of 19 mL but only modest additional left MCA territory penumbra resulting in a relatively small mismatch estimated at 14 mL. 2. 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 Patent Left ICA and Left M1. 3. The above was discussed by telephone with Dr. Kerney Elbe on 10/10/2017 at 0906 hours. 4. Superimposed Left ICA calcified atherosclerosis with estimated 60% stenosis distal Left ICA bulb. Up to moderate contralateral right ICA siphon stenosis due to calcified plaque. 5. Dominant Left vertebral artery with mild to moderate origin stenosis. Patent basilar artery without stenosis. 6. Decreased enhancement of the Right Vertebral Artery relative to the left appears related to moderate to severe origin stenosis and occlusion of the distal right V4 segment. The right PICA origin remains patent. 7. Aortic Atherosclerosis (ICD10-I70.0) and Emphysema  (ICD10-J43.9). Electronically Signed   By: Genevie Ann M.D.   On: 10/10/2017 09:30   Mr Brain Wo Contrast  Result Date: 10/11/2017 CLINICAL DATA:  84 y/o  M; stroke for follow-up. EXAM: MRI HEAD WITHOUT CONTRAST TECHNIQUE: Multiplanar, multiecho pulse sequences of the brain and surrounding structures were obtained without intravenous contrast. COMPARISON:  10/11/2017 MRI head.  10/10/2017 CT head and CTA head. FINDINGS: Brain: Area of reduced diffusion within the left lateral frontal lobe extending into the mid insula compatible with acute/early subacute infarction. Additional small focus of acute/early subacute infarction within the left occipital temporal junction. No associated hemorrhage or mass effect. Small chronic infarcts are present within bilateral cerebellar hemispheres, left thalamus, and left anterior putamen. Moderate chronic microvascular ischemic changes and parenchymal volume loss of the brain for age. Punctate foci of susceptibility hypointensity are present in right cerebellar hemisphere and left frontal periventricular white matter compatible with hemosiderin deposition of chronic microhemorrhage. No hydrocephalus, extra-axial collection, herniation, or effacement of basilar cisterns. Vascular: Normal flow voids. Skull and upper cervical spine: Normal marrow signal. Sinuses/Orbits: Negative.  Bilateral intra-ocular lens replacement. Other: None. IMPRESSION: Acute/early subacute infarction involving the left lateral frontal lobe and mid insula. Additional small focus of acute/early subacute infarction within the left occipitotemporal junction. No associated hemorrhage or mass effect. Electronically Signed   By: Kristine Garbe M.D.   On: 10/11/2017 03:31   Ct Cerebral Perfusion W Contrast  Result Date:  10/10/2017 CLINICAL DATA:  84 year old male with aphasia.  Code stroke. EXAM: CT ANGIOGRAPHY HEAD AND NECK CT PERFUSION BRAIN TECHNIQUE: Multidetector CT imaging of the head and neck was  performed using the standard protocol during bolus administration of intravenous contrast. Multiplanar CT image reconstructions and MIPs were obtained to evaluate the vascular anatomy. Carotid stenosis measurements (when applicable) are obtained utilizing NASCET criteria, using the distal internal carotid diameter as the denominator. Multiphase CT imaging of the brain was performed following IV bolus contrast injection. Subsequent parametric perfusion maps were calculated using RAPID software. CONTRAST:  154m ISOVUE-370 IOPAMIDOL (ISOVUE-370) INJECTION 76% COMPARISON:  Noncontrast head CT 0829 hours today. Cervical spine MRI 11/24/2011. FINDINGS: CT Brain Perfusion Findings: ASPECTS 10 at 0829 hours today. CBF (<30%) Volume: 19 milliliters Perfusion (Tmax>6.0s) volume: 33 milliliters Mismatch Volume: 14 milliliters Infarction Location:Middle left MCA territory/operculum CTA NECK Skeleton: No acute osseous abnormality identified. Generalized cervical spine degeneration. Upper chest: Small volume retained secretions in the visible trachea. Respiratory motion artifact in the lung apices but evidence of emphysema in the right lung apex. Incidental azygos fissure (normal variant). Other neck: Small volume retained secretions in the hypopharynx. No neck mass or lymphadenopathy. Aortic arch: Aberrancy origin of the right subclavian artery, normal variant. Moderate Calcified aortic atherosclerosis. Right carotid system: No proximal right CCA stenosis despite soft plaque. Calcified plaque at the right carotid bifurcation and right ICA bulb with no significant stenosis. Moderately tortuous mid cervical right ICA with calcified plaque and a kinked appearance, but otherwise no stenosis to the skull base. Left carotid system: Left CCA origin atherosclerosis without stenosis. Fairly bulky left carotid bifurcation calcified plaque tracking into the left ICA origin and bulb. Stenosis at the distal bulb up to 60 % with respect to  the distal vessel (series 11, image 135). Tortuosity and mild ectasia of the left ICA distal to the bulb but no additional stenosis to the skull base. Vertebral arteries: Aberrancy origin of the right subclavian artery with calcified plaque. No significant proximal subclavian stenosis. Calcified plaque at the right vertebral artery origin appears moderate to severe with patent but asymmetrically decreased enhancement of the right vertebral artery when compared to the left throughout the neck and to the skull base. No additional cervical right vertebral artery stenosis. No significant proximal left subclavian artery stenosis despite calcified plaque. At least moderate stenosis of the left vertebral artery origin due to calcified plaque. Left vertebral artery appears dominant and demonstrates superior enhancement versus the right. No additional cervical left vertebral stenosis. CTA HEAD Posterior circulation: Dominant distal left vertebral artery with patent left PICA origin. Asymmetrically decreased enhancement of the right vertebral artery V4 segment, although the right PICA origin remains patent. The distal right V4 appears occluded proximal to the vertebrobasilar junction (series 6, image 66). Patent basilar artery without stenosis. Patent SCA origins. Normal left PCA origin. Fetal type right PCA origin. Left posterior communicating artery is diminutive. The bilateral PCA branches appear patent with mild irregularity. Anterior circulation: Both ICA siphons are patent. There is moderate bilateral calcified plaque with tortuous cavernous segments. There is mild cavernous left ICA stenosis. There is mild to moderate stenosis of the right ICA siphon proximal cavernous and proximal supraclinoid segments. Normal ophthalmic and posterior communicating artery origins. Patent carotid termini. Patent MCA and left ACA origins. The left ACA is dominant and the right is diminutive or absent. The anterior communicating artery  is ectatic. The bilateral ACA branches are patent with mild irregularity. The right MCA M1 segment, bifurcation,  and right MCA branches are patent with mild irregularity. The left MCA M1 segment is patent. At the left MCA trifurcation and anterior division M2 branch is occluded as seen on series 11, image 137. Of the patent M2 branches, there are M3 branch occlusions demonstrated on series 14, image 29. Venous sinuses: Insufficient venous contrast. Anatomic variants: Dominant left vertebral artery. Dominant left ACA A1 segment. Review of the MIP images confirms the above findings IMPRESSION: 1. CTP estimates a middle Left MCA territory core infarct of 19 mL but only modest additional left MCA territory penumbra resulting in a relatively small mismatch estimated at 14 mL. 2. 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 Patent Left ICA and Left M1. 3. The above was discussed by telephone with Dr. Kerney Elbe on 10/10/2017 at 0906 hours. 4. Superimposed Left ICA calcified atherosclerosis with estimated 60% stenosis distal Left ICA bulb. Up to moderate contralateral right ICA siphon stenosis due to calcified plaque. 5. Dominant Left vertebral artery with mild to moderate origin stenosis. Patent basilar artery without stenosis. 6. Decreased enhancement of the Right Vertebral Artery relative to the left appears related to moderate to severe origin stenosis and occlusion of the distal right V4 segment. The right PICA origin remains patent. 7. Aortic Atherosclerosis (ICD10-I70.0) and Emphysema (ICD10-J43.9). Electronically Signed   By: Genevie Ann M.D.   On: 10/10/2017 09:30   Dg Chest Port 1 View  Result Date: 10/11/2017 CLINICAL DATA:  84 year old male with fever.  Initial encounter. EXAM: PORTABLE CHEST 1 VIEW COMPARISON:  07/23/2015 chest x-ray. FINDINGS: Pulmonary vascular congestion/pulmonary edema. Superimposed patchy consolidation left upper lobe and possibly right lung base may represent infectious process given  patient's history of fever. Heart size top-normal. Calcified aorta. IMPRESSION: Pulmonary vascular congestion/pulmonary edema. Superimposed patchy consolidation left upper lobe and possibly right lung base may represent infectious process given patient's history of fever. Follow-up until clearance recommended to exclude underlying mass. Aortic Atherosclerosis (ICD10-I70.0). Electronically Signed   By: Genia Del M.D.   On: 10/11/2017 11:15   Ct Head Code Stroke Wo Contrast  Result Date: 10/10/2017 CLINICAL DATA:  Code stroke.  84 year old male with aphasia. EXAM: CT HEAD WITHOUT CONTRAST TECHNIQUE: Contiguous axial images were obtained from the base of the skull through the vertex without intravenous contrast. COMPARISON:  Cervical spine MRI 11/24/2011. FINDINGS: Brain: No acute intracranial hemorrhage identified. No midline shift, mass effect, or evidence of intracranial mass lesion. Patchy bilateral cerebral white matter hypodensity, relatively mild for age. Some involvement of the basal ganglia, more so on the left. No cortically based acute hemispheric infarct identified, but there is patchy age indeterminate hypodensity in the central right cerebellum (series 7, image 7 and coronal image 54). No cortical encephalomalacia identified. Vascular: Calcified atherosclerosis at the skull base. No suspicious intracranial vascular hyperdensity. Skull: No acute osseous abnormality identified. Sinuses/Orbits: Well pneumatized. Other: Postoperative changes to both globes. No acute orbit or scalp soft tissue finding identified. ASPECTS [facility] Stroke Program Early CT Score) - Ganglionic level infarction (caudate, lentiform nuclei, internal capsule, insula, M1-M3 cortex): 7 - Supraganglionic infarction (M4-M6 cortex): 3 Total score (0-10 with 10 being normal): 10 IMPRESSION: 1. Age indeterminate mild patchy hypodensity in the right central cerebellum. No intracranial hemorrhage or acute cortically based infarct  identified. 2. ASPECTS is 10. 3. These results were communicated to Dr. Cheral Marker at 8:39 amon 5/6/2019by text page via the [facility] messaging system. Electronically Signed   By: Genevie Ann M.D.   On: 10/10/2017 08:40  2D echocardiogram - Left ventricle: The cavity size was normal. Wall thickness was increased in a pattern of severe LVH. Systolic function was mildly reduced. The estimated ejection fraction was in the range of 45% to 50%. There is akinesis of the basallateral and inferior myocardium. Features are consistent with a pseudonormal left ventricular filling pattern, with concomitant abnormal relaxation and increased filling pressure (grade 2 diastolic dysfunction). - Aortic valve: There was trivial regurgitation. - Mitral valve: Calcified annulus. Impressions:   Small area of akinesis in the basal inferior and lateral walls with overall mildly reduced LV systolic function; severe LVH; moderate diastolic dysfunction; trace AI.  Lower Extremity Doppler Negative for deep and superficial vein thrombosis.   PHYSICAL EXAM HEENT: [place]/AT Lungs: mouth breathing. Breath sounds cleat to auscultation.  Cardiac: HRR Ext: Noncyanotic Neuro - awake alert comfortable, not following commands, PERRL, left gaze preference but able to cross midline LUE 4/5, RUE 3/5. LLE withdraw against gravity, RLE 3/5 on pain stimulation.    ASSESSMENT/PLAN Ronald Benitez is a 84 y.o. male with history of hypertension, hyperlipidemia, coronary artery disease, chronic kidney disease, diabetes, Parkinson's, first-degree heart block and known PFO found down in the bathroom presenting with right hemiparesis and global aphasia.   Stroke:   Left lateral frontal/mid insular infarct and small occipital infarct, appears embolic secondary to unknown source  -Continue ASA -Awaiting family decision after meeting palliative team, most likely they would like to proceed with hospice and comfort care measures only according to  his son in law who was at bedside -Decrease Neuro exam to Q Shift so he can rest at night -Palliative team is following, appreciate their help   Hospital day # [place], MD     To contact Stroke Continuity provider, please refer to [email or web address] After hours, contact General Neurology

## 2017-10-15 NOTE — Progress Notes (Signed)
PROGRESS NOTE    Ronald Benitez  SLH:[id number] DOB: 12/26/1928 DOA: 10/10/2017 PCP: [name], MD   Brief Narrative: 84 yo M with good prior functional status, history of DM, CAD s/p PCI, Parkinson's ds, HTN, HLD, CKD who was brought to the hospital on 5/6 after being found down in the bathroom by his wife around 7 am. He was noted to have right sided hemiplegia and neglect. Neurology was consulted in the ED. Cardiology was consulted as well for positive troponin and ST segment changes on EKG. No reports of chest discomfort prior to this episode.    Assessment & Plan:   Principal Problem:   CVA (cerebral vascular accident) ([place]) Active Problems:   Essential hypertension   Hyperlipidemia   Chronic renal disease, stage III (HCC)   CAD (coronary artery disease)   DM (diabetes mellitus) ([place])   Elevated troponin   Acute ischemic stroke ([place])   Parkinson's disease ([place])   Dysphagia   Palliative care by specialist   Goals of care, counseling/discussion   Agitation   Increased oropharyngeal secretions  1-Acute CVA -MRI on admission showed acute/early subacute infarction involving the left lateral frontal lobe and mid insula. Additional small focus of acute/early subacute infarction within the left occipitotemporal junction. No associated hemorrhage or mass effect. -neurology following.  -ECHO ef lower 45 %, wall motion abnormalities.  -LDL 61 A1-c 8.1 -Aspirin per rectum.  -Fail swallow evaluation. Family unsure about NG tube,  Palliative met with  Family and discussed goals. Family has not made decision regarding tube feeding. They seem to be clear regarding not to pursue PEG tube -global aphasia, moves left upper extremities.  -Awaiting family decision regarding goals of care.   2-PNA; Fever, chest x ray with patchy consolidation left upper lobe and possible right bases. Started IV antibiotics to cover for PNA.  Continue with IV antibiotics.   3-Elevation  troponin;  ECHO with wall motion abnormalities.  Cardiology following. Await rec.  Peak to 1.9. Thought to be related to CVA.  No further cardiac evaluation at this time due to recent stroke.   DM; SSI.   CKD stage IV; Cr at 2.3 stable. Continue to monitor.  Stable.   HTN;  Permissive HTN.   Parkinson's Diseases;  Medications on hold due to NPO status.   DVT prophylaxis: lovenox Code Status: DNR.  Family Communication: wife and son at bedside. They will meet with palliative again today  Disposition Plan: to be determine,   Consultants:   Neurology  Cardiology   Palliative.      Procedures:   ECHO;    Antimicrobials:  Unasyn 5-08   Subjective: He was agitated last night, he was not able to sleep last night,.  He restless, he is following some command.   Objective: Vitals:   10/14/17 1930 10/14/17 2332 10/15/17 0357 10/15/17 0746  BP: (!) 169/82 (!) 171/85 (!) 167/90 (!) 177/91  Pulse: 82 79 79 88  Resp: (!) 22 (!) 22 (!) 22 18  Temp: 97.8 F (36.6 C) 97.9 F (36.6 C) 98 F (36.7 C) 97.8 F (36.6 C)  TempSrc: Oral Oral Oral Axillary  SpO2: 99% 97% 99% 99%  Weight:      Height:        Intake/Output Summary (Last 24 hours) at 10/15/2017 1039 Last data filed at 10/15/2017 0704 Gross per 24 hour  Intake -  Output 600 ml  Net -600 ml   Filed Weights   10/10/17 0906  Weight: 76.8 kg (  169 lb 5 oz)    Examination:  General exam: open eyes, follow some command, aphasic.   Respiratory system: Bilateral ronchus.  Cardiovascular system: S 1, S 2 RRR Gastrointestinal system; BS present, soft, nt Central nervous system: aphasia, follows command.  Extremities: no edema.  Skin: no rash     Data Reviewed: I have personally reviewed following labs and imaging studies  CBC: Recent Labs  Lab 10/10/17 0821 10/10/17 0834 10/12/17 0534 10/13/17 0653  WBC 9.2  --  8.4 8.2  NEUTROABS 6.9  --   --   --   HGB 11.5* 12.2* 11.2* 11.1*  HCT 35.7*  36.0* 35.1* 35.0*  MCV 95.2  --  95.1 94.6  PLT 215  --  212 607   Basic Metabolic Panel: Recent Labs  Lab 10/10/17 0821 10/10/17 0834 10/12/17 0534 10/13/17 0653 10/15/17 0334  NA 139 142 143 144 145  K 4.0 4.0 4.1 4.0 4.0  CL 106 108 112* 113* 113*  CO2 21*  --  21* 22 24  GLUCOSE 156* 154* 136* 121* 134*  BUN 46* 40* 41* 38* 36*  CREATININE 2.62* 2.50* 2.35* 2.30* 1.98*  CALCIUM 9.4  --  9.1 9.1 9.3   GFR: Estimated Creatinine Clearance: 28 mL/min (A) (by C-G formula based on SCr of 1.98 mg/dL (H)). Liver Function Tests: Recent Labs  Lab 10/10/17 0821  AST 27  ALT 7*  ALKPHOS 73  BILITOT 1.3*  PROT 7.0  ALBUMIN 3.5   No results for input(s): LIPASE, AMYLASE in the last 168 hours. No results for input(s): AMMONIA in the last 168 hours. Coagulation Profile: Recent Labs  Lab 10/10/17 0821  INR 1.10   Cardiac Enzymes: Recent Labs  Lab 10/10/17 1804 10/11/17 0019 10/11/17 0621 10/11/17 1214 10/11/17 1733  TROPONINI 0.59* 1.34* 1.71* 1.97* 1.79*   BNP (last 3 results) No results for input(s): PROBNP in the last 8760 hours. HbA1C: No results for input(s): HGBA1C in the last 72 hours. CBG: Recent Labs  Lab 10/14/17 1540 10/14/17 2020 10/15/17 0016 10/15/17 0355 10/15/17 0744  GLUCAP 106* 137* 143* 125* 127*   Lipid Profile: No results for input(s): CHOL, HDL, LDLCALC, TRIG, CHOLHDL, LDLDIRECT in the last 72 hours. Thyroid Function Tests: No results for input(s): TSH, T4TOTAL, FREET4, T3FREE, THYROIDAB in the last 72 hours. Anemia Panel: No results for input(s): VITAMINB12, FOLATE, FERRITIN, TIBC, IRON, RETICCTPCT in the last 72 hours. Sepsis Labs: No results for input(s): PROCALCITON, LATICACIDVEN in the last 168 hours.  No results found for this or any previous visit (from the past 240 hour(s)).       Radiology Studies: No results found.      Scheduled Meds: . aspirin  300 mg Rectal Daily   Or  . aspirin  325 mg Oral Daily  .  atorvastatin  80 mg Oral q1800  . chlorhexidine  15 mL Mouth Rinse BID  . enoxaparin (LOVENOX) injection  30 mg Subcutaneous Q24H  . insulin aspart  0-9 Units Subcutaneous Q4H  . mouth rinse  15 mL Mouth Rinse BID   Continuous Infusions: . ampicillin-sulbactam (UNASYN) IV 3 g (10/14/17 2325)  . dextrose 1,000 mL (10/15/17 0108)     LOS: 5 days    Time spent: 35 minutes.     Elmarie Shiley, MD [facility] Pager [phone]  If 7PM-7AM, please contact night-coverage [email or web address] Password [email or web address] 10/15/2017, 10:39 AM

## 2017-10-16 DIAGNOSIS — Z9189 Other specified personal risk factors, not elsewhere classified: Secondary | ICD-10-CM

## 2017-10-16 DIAGNOSIS — Z515 Encounter for palliative care: Secondary | ICD-10-CM

## 2017-10-16 LAB — GLUCOSE, CAPILLARY
Glucose-Capillary: 111 mg/dL — ABNORMAL HIGH (ref 65–99)
Glucose-Capillary: 112 mg/dL — ABNORMAL HIGH (ref 65–99)
Glucose-Capillary: 114 mg/dL — ABNORMAL HIGH (ref 65–99)
Glucose-Capillary: 102 mg/dL — ABNORMAL HIGH (ref 65–99)
Glucose-Capillary: 116 mg/dL — ABNORMAL HIGH (ref 65–99)

## 2017-10-16 MED ORDER — MORPHINE SULFATE (PF) 2 MG/ML IV SOLN
1.0000 mg | INTRAVENOUS | Status: DC | PRN
  Administered 2017-10-19: 1 mg via INTRAVENOUS
  Filled 2017-10-16: qty 1

## 2017-10-16 MED ORDER — LORAZEPAM 2 MG/ML IJ SOLN
0.5000 mg | INTRAMUSCULAR | Status: DC | PRN
  Administered 2017-10-17: 0.5 mg via INTRAVENOUS
  Filled 2017-10-16 (×2): qty 1

## 2017-10-16 MED ORDER — ACETAMINOPHEN 10 MG/ML IV SOLN
1000.0000 mg | Freq: Four times a day (QID) | INTRAVENOUS | Status: AC | PRN
  Administered 2017-10-17: 1000 mg via INTRAVENOUS
  Filled 2017-10-16 (×2): qty 100

## 2017-10-16 MED ORDER — SCOPOLAMINE 1 MG/3DAYS TD PT72
1.0000 | MEDICATED_PATCH | TRANSDERMAL | Status: DC
  Filled 2017-10-16 (×2): qty 1

## 2017-10-16 NOTE — Progress Notes (Signed)
Attempted to swab pt's mouth. Pt turned head and put up hand. Attempted a second time and pt again refused. Family at bedside stated they would attempt mouth care but if pt was unwilling, they did not deem it necessary. Family also refused scopolamine patch at this time. Med placed on hold.

## 2017-10-16 NOTE — Progress Notes (Signed)
PROGRESS NOTE    Ronald Benitez  TML:[id number] DOB: August 25, 1928 DOA: 10/10/2017 PCP: [name], MD   Brief Narrative: 84 yo M with good prior functional status, history of DM, CAD s/p PCI, Parkinson's ds, HTN, HLD, CKD who was brought to the hospital on 5/6 after being found down in the bathroom by his wife around 7 am. He was noted to have right sided hemiplegia and neglect. Neurology was consulted in the ED. Cardiology was consulted as well for positive troponin and ST segment changes on EKG. No reports of chest discomfort prior to this episode.    Assessment & Plan:   Principal Problem:   CVA (cerebral vascular accident) ([place]) Active Problems:   Essential hypertension   Hyperlipidemia   Chronic renal disease, stage III (HCC)   CAD (coronary artery disease)   DM (diabetes mellitus) ([place])   Elevated troponin   Acute ischemic stroke ([place])   Parkinson's disease ([place])   Dysphagia   Palliative care by specialist   Goals of care, counseling/discussion   Agitation   Increased oropharyngeal secretions  1-Acute CVA -MRI on admission showed acute/early subacute infarction involving the left lateral frontal lobe and mid insula. Additional small focus of acute/early subacute infarction within the left occipitotemporal junction. No associated hemorrhage or mass effect. -neurology following.  -ECHO ef lower 45 %, wall motion abnormalities.  -LDL 61 A1-c 8.1 -Aspirin per rectum.  -Fail swallow evaluation. Family unsure about NG tube,  Palliative met with  Family and discussed goals. Family has not made decision regarding tube feeding. They seem to be clear regarding not to pursue PEG tube -global aphasia, moves left upper extremities.  Wife and family has chosen for comfort care, hospice. Support provided.   2-PNA; Fever, chest x ray with patchy consolidation left upper lobe and possible right bases. Started IV antibiotics to cover for PNA.  Received 5 days of antibiotic.  Will discontinue antibiotics.   3-Elevation troponin;  ECHO with wall motion abnormalities.  Cardiology following. Await rec.  Peak to 1.9. Thought to be related to CVA.  No further cardiac evaluation at this time due to recent stroke.   DM; SSI.   CKD stage IV; Cr at 2.3 stable. Continue to monitor.  Stable.   HTN;  Permissive HTN.   Parkinson's Diseases;  Medications on hold due to NPO status.   DVT prophylaxis: lovenox Code Status: DNR.  Family Communication: family has opted for not tube feeding,hospcie.  Disposition Plan: SW consulted for residential hospice.   Consultants:   Neurology  Cardiology   Palliative.      Procedures:   ECHO;    Antimicrobials:  Unasyn 5-08   Subjective: He is sleepy, open eyes, moves left side. Aphasic.    Objective: Vitals:   10/16/17 0119 10/16/17 0420 10/16/17 0907 10/16/17 1311  BP: (!) 141/79 (!) 137/46 (!) 147/73 (!) 164/72  Pulse: 72 (!) 54 (!) 56 64  Resp:  20 (!) 22 20  Temp: 98 F (36.7 C) 98.7 F (37.1 C) 98.3 F (36.8 C) 98.2 F (36.8 C)  TempSrc: Oral Oral Axillary Axillary  SpO2: 99% 100% 91% 96%  Weight:      Height:        Intake/Output Summary (Last 24 hours) at 10/16/2017 1331 Last data filed at 10/16/2017 0800 Gross per 24 hour  Intake 3245 ml  Output 350 ml  Net 2895 ml   Filed Weights   10/10/17 0906  Weight: 76.8 kg (169 lb 5 oz)  Examination:  General exam: NAD Respiratory system; Bilateral ronchus Cardiovascular system: S 1, S 2 RRR Gastrointestinal system; BS present, soft, nt, nd Central nervous system: aphasic.  Extremities: no edema Skin: no rash     Data Reviewed: I have personally reviewed following labs and imaging studies  CBC: Recent Labs  Lab 10/10/17 0821 10/10/17 0834 10/12/17 0534 10/13/17 0653  WBC 9.2  --  8.4 8.2  NEUTROABS 6.9  --   --   --   HGB 11.5* 12.2* 11.2* 11.1*  HCT 35.7* 36.0* 35.1* 35.0*  MCV 95.2  --  95.1 94.6  PLT 215  --  212  250   Basic Metabolic Panel: Recent Labs  Lab 10/10/17 0821 10/10/17 0834 10/12/17 0534 10/13/17 0653 10/15/17 0334  NA 139 142 143 144 145  K 4.0 4.0 4.1 4.0 4.0  CL 106 108 112* 113* 113*  CO2 21*  --  21* 22 24  GLUCOSE 156* 154* 136* 121* 134*  BUN 46* 40* 41* 38* 36*  CREATININE 2.62* 2.50* 2.35* 2.30* 1.98*  CALCIUM 9.4  --  9.1 9.1 9.3   GFR: Estimated Creatinine Clearance: 28 mL/min (A) (by C-G formula based on SCr of 1.98 mg/dL (H)). Liver Function Tests: Recent Labs  Lab 10/10/17 0821  AST 27  ALT 7*  ALKPHOS 73  BILITOT 1.3*  PROT 7.0  ALBUMIN 3.5   No results for input(s): LIPASE, AMYLASE in the last 168 hours. No results for input(s): AMMONIA in the last 168 hours. Coagulation Profile: Recent Labs  Lab 10/10/17 0821  INR 1.10   Cardiac Enzymes: Recent Labs  Lab 10/10/17 1804 10/11/17 0019 10/11/17 0621 10/11/17 1214 10/11/17 1733  TROPONINI 0.59* 1.34* 1.71* 1.97* 1.79*   BNP (last 3 results) No results for input(s): PROBNP in the last 8760 hours. HbA1C: No results for input(s): HGBA1C in the last 72 hours. CBG: Recent Labs  Lab 10/15/17 2036 10/16/17 0059 10/16/17 0416 10/16/17 0754 10/16/17 1132  GLUCAP 124* 111* 112* 114* 102*   Lipid Profile: No results for input(s): CHOL, HDL, LDLCALC, TRIG, CHOLHDL, LDLDIRECT in the last 72 hours. Thyroid Function Tests: No results for input(s): TSH, T4TOTAL, FREET4, T3FREE, THYROIDAB in the last 72 hours. Anemia Panel: No results for input(s): VITAMINB12, FOLATE, FERRITIN, TIBC, IRON, RETICCTPCT in the last 72 hours. Sepsis Labs: No results for input(s): PROCALCITON, LATICACIDVEN in the last 168 hours.  No results found for this or any previous visit (from the past 240 hour(s)).       Radiology Studies: No results found.      Scheduled Meds: . aspirin  300 mg Rectal Daily   Or  . aspirin  325 mg Oral Daily  . atorvastatin  80 mg Oral q1800  . chlorhexidine  15 mL Mouth  Rinse BID  . enoxaparin (LOVENOX) injection  30 mg Subcutaneous Q24H  . insulin aspart  0-9 Units Subcutaneous Q4H  . LORazepam  0.5 mg Intravenous QHS  . mouth rinse  15 mL Mouth Rinse BID   Continuous Infusions: . ampicillin-sulbactam (UNASYN) IV Stopped (10/16/17 1242)  . dextrose 50 mL/hr at 10/16/17 0002     LOS: 6 days    Time spent: 35 minutes.     Elmarie Shiley, MD [facility] Pager [phone]  If 7PM-7AM, please contact night-coverage [email or web address] Password [email or web address] 10/16/2017, 1:31 PM

## 2017-10-16 NOTE — Progress Notes (Signed)
STROKE TEAM PROGRESS NOTE   INTERVAL HISTORY Patient Wife at bedside, I updated her about the exam, it seems like she has made up her mind to take patient to hospice with no PEG placements, support and reassurance was given. Pt looks comfortable in bed.  Vitals:   10/15/17 2032 10/16/17 0020 10/16/17 0119 10/16/17 0420  BP: (!) 146/63 (!) 111/56 (!) 141/79 (!) 137/46  Pulse: (!) 54 62 72 (!) 54  Resp: 19 (!) 24  20  Temp: 98.2 F (36.8 C) 98.2 F (36.8 C) 98 F (36.7 C) 98.7 F (37.1 C)  TempSrc: Oral Oral Oral Oral  SpO2: 100% 97% 99% 100%  Weight:      Height:        CBC:  CBC Latest Ref Rng & Units 10/13/2017 10/12/2017 10/10/2017  WBC 4.0 - 10.5 K/uL 8.2 8.4 -  Hemoglobin 13.0 - 17.0 g/dL 11.1(L) 11.2(L) 12.2(L)  Hematocrit 39.0 - 52.0 % 35.0(L) 35.1(L) 36.0(L)  Platelets 150 - 400 K/uL 205 212 -     Comprehensive Metabolic Panel:   CMP Latest Ref Rng & Units 10/15/2017 10/13/2017 10/12/2017  Glucose 65 - 99 mg/dL 134(H) 121(H) 136(H)  BUN 6 - 20 mg/dL 36(H) 38(H) 41(H)  Creatinine 0.61 - 1.24 mg/dL 1.98(H) 2.30(H) 2.35(H)  Sodium 135 - 145 mmol/L 145 144 143  Potassium 3.5 - 5.1 mmol/L 4.0 4.0 4.1  Chloride 101 - 111 mmol/L 113(H) 113(H) 112(H)  CO2 22 - 32 mmol/L 24 22 21(L)  Calcium 8.9 - 10.3 mg/dL 9.3 9.1 9.1  Total Protein 6.5 - 8.1 g/dL - - -  Total Bilirubin 0.3 - 1.2 mg/dL - - -  Alkaline Phos 38 - 126 U/L - - -  AST 15 - 41 U/L - - -  ALT 17 - 63 U/L - - -   Lipid Panel:     Component Value Date/Time   CHOL 120 10/11/2017 0621   TRIG 76 10/11/2017 0621   HDL 44 10/11/2017 0621   CHOLHDL 2.7 10/11/2017 0621   VLDL 15 10/11/2017 0621   LDLCALC 61 10/11/2017 0621   HgbA1c:  Lab Results  Component Value Date   HGBA1C 8.1 (H) 10/10/2017   Urine Drug Screen:     Component Value Date/Time   LABOPIA NONE DETECTED 10/10/2017 0821   COCAINSCRNUR NONE DETECTED 10/10/2017 0821   LABBENZ NONE DETECTED 10/10/2017 0821   AMPHETMU NONE DETECTED 10/10/2017 0821    THCU NONE DETECTED 10/10/2017 0821   LABBARB NONE DETECTED 10/10/2017 0821    Alcohol Level     Component Value Date/Time   ETH <10 10/10/2017 1030    IMAGING I have personally reviewed the radiological images below and agree with the radiology interpretations.  Ct Angio Head W Or Wo Contrast  Result Date: 10/10/2017 CLINICAL DATA:  84 year old male with aphasia.  Code stroke. EXAM: CT ANGIOGRAPHY HEAD AND NECK CT PERFUSION BRAIN TECHNIQUE: Multidetector CT imaging of the head and neck was performed using the standard protocol during bolus administration of intravenous contrast. Multiplanar CT image reconstructions and MIPs were obtained to evaluate the vascular anatomy. Carotid stenosis measurements (when applicable) are obtained utilizing NASCET criteria, using the distal internal carotid diameter as the denominator. Multiphase CT imaging of the brain was performed following IV bolus contrast injection. Subsequent parametric perfusion maps were calculated using RAPID software. CONTRAST:  113m ISOVUE-370 IOPAMIDOL (ISOVUE-370) INJECTION 76% COMPARISON:  Noncontrast head CT 0829 hours today. Cervical spine MRI 11/24/2011. FINDINGS: CT Brain Perfusion Findings: ASPECTS 10  at 0829 hours today. CBF (<30%) Volume: 19 milliliters Perfusion (Tmax>6.0s) volume: 33 milliliters Mismatch Volume: 14 milliliters Infarction Location:Middle left MCA territory/operculum CTA NECK Skeleton: No acute osseous abnormality identified. Generalized cervical spine degeneration. Upper chest: Small volume retained secretions in the visible trachea. Respiratory motion artifact in the lung apices but evidence of emphysema in the right lung apex. Incidental azygos fissure (normal variant). Other neck: Small volume retained secretions in the hypopharynx. No neck mass or lymphadenopathy. Aortic arch: Aberrancy origin of the right subclavian artery, normal variant. Moderate Calcified aortic atherosclerosis. Right carotid system: No  proximal right CCA stenosis despite soft plaque. Calcified plaque at the right carotid bifurcation and right ICA bulb with no significant stenosis. Moderately tortuous mid cervical right ICA with calcified plaque and a kinked appearance, but otherwise no stenosis to the skull base. Left carotid system: Left CCA origin atherosclerosis without stenosis. Fairly bulky left carotid bifurcation calcified plaque tracking into the left ICA origin and bulb. Stenosis at the distal bulb up to 60 % with respect to the distal vessel (series 11, image 135). Tortuosity and mild ectasia of the left ICA distal to the bulb but no additional stenosis to the skull base. Vertebral arteries: Aberrancy origin of the right subclavian artery with calcified plaque. No significant proximal subclavian stenosis. Calcified plaque at the right vertebral artery origin appears moderate to severe with patent but asymmetrically decreased enhancement of the right vertebral artery when compared to the left throughout the neck and to the skull base. No additional cervical right vertebral artery stenosis. No significant proximal left subclavian artery stenosis despite calcified plaque. At least moderate stenosis of the left vertebral artery origin due to calcified plaque. Left vertebral artery appears dominant and demonstrates superior enhancement versus the right. No additional cervical left vertebral stenosis. CTA HEAD Posterior circulation: Dominant distal left vertebral artery with patent left PICA origin. Asymmetrically decreased enhancement of the right vertebral artery V4 segment, although the right PICA origin remains patent. The distal right V4 appears occluded proximal to the vertebrobasilar junction (series 6, image 66). Patent basilar artery without stenosis. Patent SCA origins. Normal left PCA origin. Fetal type right PCA origin. Left posterior communicating artery is diminutive. The bilateral PCA branches appear patent with mild  irregularity. Anterior circulation: Both ICA siphons are patent. There is moderate bilateral calcified plaque with tortuous cavernous segments. There is mild cavernous left ICA stenosis. There is mild to moderate stenosis of the right ICA siphon proximal cavernous and proximal supraclinoid segments. Normal ophthalmic and posterior communicating artery origins. Patent carotid termini. Patent MCA and left ACA origins. The left ACA is dominant and the right is diminutive or absent. The anterior communicating artery is ectatic. The bilateral ACA branches are patent with mild irregularity. The right MCA M1 segment, bifurcation, and right MCA branches are patent with mild irregularity. The left MCA M1 segment is patent. At the left MCA trifurcation and anterior division M2 branch is occluded as seen on series 11, image 137. Of the patent M2 branches, there are M3 branch occlusions demonstrated on series 14, image 29. Venous sinuses: Insufficient venous contrast. Anatomic variants: Dominant left vertebral artery. Dominant left ACA A1 segment. Review of the MIP images confirms the above findings IMPRESSION: 1. CTP estimates a middle Left MCA territory core infarct of 19 mL but only modest additional left MCA territory penumbra resulting in a relatively small mismatch estimated at 14 mL. 2. 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 Patent Left ICA and Left M1.  3. The above was discussed by telephone with Dr. Kerney Elbe on 10/10/2017 at 0906 hours. 4. Superimposed Left ICA calcified atherosclerosis with estimated 60% stenosis distal Left ICA bulb. Up to moderate contralateral right ICA siphon stenosis due to calcified plaque. 5. Dominant Left vertebral artery with mild to moderate origin stenosis. Patent basilar artery without stenosis. 6. Decreased enhancement of the Right Vertebral Artery relative to the left appears related to moderate to severe origin stenosis and occlusion of the distal right V4 segment. The right  PICA origin remains patent. 7. Aortic Atherosclerosis (ICD10-I70.0) and Emphysema (ICD10-J43.9). Electronically Signed   By: Genevie Ann M.D.   On: 10/10/2017 09:30   Ct Angio Neck W Or Wo Contrast  Result Date: 10/10/2017 CLINICAL DATA:  84 year old male with aphasia.  Code stroke. EXAM: CT ANGIOGRAPHY HEAD AND NECK CT PERFUSION BRAIN TECHNIQUE: Multidetector CT imaging of the head and neck was performed using the standard protocol during bolus administration of intravenous contrast. Multiplanar CT image reconstructions and MIPs were obtained to evaluate the vascular anatomy. Carotid stenosis measurements (when applicable) are obtained utilizing NASCET criteria, using the distal internal carotid diameter as the denominator. Multiphase CT imaging of the brain was performed following IV bolus contrast injection. Subsequent parametric perfusion maps were calculated using RAPID software. CONTRAST:  166m ISOVUE-370 IOPAMIDOL (ISOVUE-370) INJECTION 76% COMPARISON:  Noncontrast head CT 0829 hours today. Cervical spine MRI 11/24/2011. FINDINGS: CT Brain Perfusion Findings: ASPECTS 10 at 0829 hours today. CBF (<30%) Volume: 19 milliliters Perfusion (Tmax>6.0s) volume: 33 milliliters Mismatch Volume: 14 milliliters Infarction Location:Middle left MCA territory/operculum CTA NECK Skeleton: No acute osseous abnormality identified. Generalized cervical spine degeneration. Upper chest: Small volume retained secretions in the visible trachea. Respiratory motion artifact in the lung apices but evidence of emphysema in the right lung apex. Incidental azygos fissure (normal variant). Other neck: Small volume retained secretions in the hypopharynx. No neck mass or lymphadenopathy. Aortic arch: Aberrancy origin of the right subclavian artery, normal variant. Moderate Calcified aortic atherosclerosis. Right carotid system: No proximal right CCA stenosis despite soft plaque. Calcified plaque at the right carotid bifurcation and right  ICA bulb with no significant stenosis. Moderately tortuous mid cervical right ICA with calcified plaque and a kinked appearance, but otherwise no stenosis to the skull base. Left carotid system: Left CCA origin atherosclerosis without stenosis. Fairly bulky left carotid bifurcation calcified plaque tracking into the left ICA origin and bulb. Stenosis at the distal bulb up to 60 % with respect to the distal vessel (series 11, image 135). Tortuosity and mild ectasia of the left ICA distal to the bulb but no additional stenosis to the skull base. Vertebral arteries: Aberrancy origin of the right subclavian artery with calcified plaque. No significant proximal subclavian stenosis. Calcified plaque at the right vertebral artery origin appears moderate to severe with patent but asymmetrically decreased enhancement of the right vertebral artery when compared to the left throughout the neck and to the skull base. No additional cervical right vertebral artery stenosis. No significant proximal left subclavian artery stenosis despite calcified plaque. At least moderate stenosis of the left vertebral artery origin due to calcified plaque. Left vertebral artery appears dominant and demonstrates superior enhancement versus the right. No additional cervical left vertebral stenosis. CTA HEAD Posterior circulation: Dominant distal left vertebral artery with patent left PICA origin. Asymmetrically decreased enhancement of the right vertebral artery V4 segment, although the right PICA origin remains patent. The distal right V4 appears occluded proximal to the vertebrobasilar junction (series 6,  image 66). Patent basilar artery without stenosis. Patent SCA origins. Normal left PCA origin. Fetal type right PCA origin. Left posterior communicating artery is diminutive. The bilateral PCA branches appear patent with mild irregularity. Anterior circulation: Both ICA siphons are patent. There is moderate bilateral calcified plaque with  tortuous cavernous segments. There is mild cavernous left ICA stenosis. There is mild to moderate stenosis of the right ICA siphon proximal cavernous and proximal supraclinoid segments. Normal ophthalmic and posterior communicating artery origins. Patent carotid termini. Patent MCA and left ACA origins. The left ACA is dominant and the right is diminutive or absent. The anterior communicating artery is ectatic. The bilateral ACA branches are patent with mild irregularity. The right MCA M1 segment, bifurcation, and right MCA branches are patent with mild irregularity. The left MCA M1 segment is patent. At the left MCA trifurcation and anterior division M2 branch is occluded as seen on series 11, image 137. Of the patent M2 branches, there are M3 branch occlusions demonstrated on series 14, image 29. Venous sinuses: Insufficient venous contrast. Anatomic variants: Dominant left vertebral artery. Dominant left ACA A1 segment. Review of the MIP images confirms the above findings IMPRESSION: 1. CTP estimates a middle Left MCA territory core infarct of 19 mL but only modest additional left MCA territory penumbra resulting in a relatively small mismatch estimated at 14 mL. 2. 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 Patent Left ICA and Left M1. 3. The above was discussed by telephone with Dr. Kerney Elbe on 10/10/2017 at 0906 hours. 4. Superimposed Left ICA calcified atherosclerosis with estimated 60% stenosis distal Left ICA bulb. Up to moderate contralateral right ICA siphon stenosis due to calcified plaque. 5. Dominant Left vertebral artery with mild to moderate origin stenosis. Patent basilar artery without stenosis. 6. Decreased enhancement of the Right Vertebral Artery relative to the left appears related to moderate to severe origin stenosis and occlusion of the distal right V4 segment. The right PICA origin remains patent. 7. Aortic Atherosclerosis (ICD10-I70.0) and Emphysema (ICD10-J43.9). Electronically  Signed   By: Genevie Ann M.D.   On: 10/10/2017 09:30   Mr Brain Wo Contrast  Result Date: 10/11/2017 CLINICAL DATA:  84 y/o  M; stroke for follow-up. EXAM: MRI HEAD WITHOUT CONTRAST TECHNIQUE: Multiplanar, multiecho pulse sequences of the brain and surrounding structures were obtained without intravenous contrast. COMPARISON:  10/11/2017 MRI head.  10/10/2017 CT head and CTA head. FINDINGS: Brain: Area of reduced diffusion within the left lateral frontal lobe extending into the mid insula compatible with acute/early subacute infarction. Additional small focus of acute/early subacute infarction within the left occipital temporal junction. No associated hemorrhage or mass effect. Small chronic infarcts are present within bilateral cerebellar hemispheres, left thalamus, and left anterior putamen. Moderate chronic microvascular ischemic changes and parenchymal volume loss of the brain for age. Punctate foci of susceptibility hypointensity are present in right cerebellar hemisphere and left frontal periventricular white matter compatible with hemosiderin deposition of chronic microhemorrhage. No hydrocephalus, extra-axial collection, herniation, or effacement of basilar cisterns. Vascular: Normal flow voids. Skull and upper cervical spine: Normal marrow signal. Sinuses/Orbits: Negative.  Bilateral intra-ocular lens replacement. Other: None. IMPRESSION: Acute/early subacute infarction involving the left lateral frontal lobe and mid insula. Additional small focus of acute/early subacute infarction within the left occipitotemporal junction. No associated hemorrhage or mass effect. Electronically Signed   By: Kristine Garbe M.D.   On: 10/11/2017 03:31   Ct Cerebral Perfusion W Contrast  Result Date: 10/10/2017 CLINICAL DATA:  84 year old  male with aphasia.  Code stroke. EXAM: CT ANGIOGRAPHY HEAD AND NECK CT PERFUSION BRAIN TECHNIQUE: Multidetector CT imaging of the head and neck was performed using the standard  protocol during bolus administration of intravenous contrast. Multiplanar CT image reconstructions and MIPs were obtained to evaluate the vascular anatomy. Carotid stenosis measurements (when applicable) are obtained utilizing NASCET criteria, using the distal internal carotid diameter as the denominator. Multiphase CT imaging of the brain was performed following IV bolus contrast injection. Subsequent parametric perfusion maps were calculated using RAPID software. CONTRAST:  179m ISOVUE-370 IOPAMIDOL (ISOVUE-370) INJECTION 76% COMPARISON:  Noncontrast head CT 0829 hours today. Cervical spine MRI 11/24/2011. FINDINGS: CT Brain Perfusion Findings: ASPECTS 10 at 0829 hours today. CBF (<30%) Volume: 19 milliliters Perfusion (Tmax>6.0s) volume: 33 milliliters Mismatch Volume: 14 milliliters Infarction Location:Middle left MCA territory/operculum CTA NECK Skeleton: No acute osseous abnormality identified. Generalized cervical spine degeneration. Upper chest: Small volume retained secretions in the visible trachea. Respiratory motion artifact in the lung apices but evidence of emphysema in the right lung apex. Incidental azygos fissure (normal variant). Other neck: Small volume retained secretions in the hypopharynx. No neck mass or lymphadenopathy. Aortic arch: Aberrancy origin of the right subclavian artery, normal variant. Moderate Calcified aortic atherosclerosis. Right carotid system: No proximal right CCA stenosis despite soft plaque. Calcified plaque at the right carotid bifurcation and right ICA bulb with no significant stenosis. Moderately tortuous mid cervical right ICA with calcified plaque and a kinked appearance, but otherwise no stenosis to the skull base. Left carotid system: Left CCA origin atherosclerosis without stenosis. Fairly bulky left carotid bifurcation calcified plaque tracking into the left ICA origin and bulb. Stenosis at the distal bulb up to 60 % with respect to the distal vessel (series 11,  image 135). Tortuosity and mild ectasia of the left ICA distal to the bulb but no additional stenosis to the skull base. Vertebral arteries: Aberrancy origin of the right subclavian artery with calcified plaque. No significant proximal subclavian stenosis. Calcified plaque at the right vertebral artery origin appears moderate to severe with patent but asymmetrically decreased enhancement of the right vertebral artery when compared to the left throughout the neck and to the skull base. No additional cervical right vertebral artery stenosis. No significant proximal left subclavian artery stenosis despite calcified plaque. At least moderate stenosis of the left vertebral artery origin due to calcified plaque. Left vertebral artery appears dominant and demonstrates superior enhancement versus the right. No additional cervical left vertebral stenosis. CTA HEAD Posterior circulation: Dominant distal left vertebral artery with patent left PICA origin. Asymmetrically decreased enhancement of the right vertebral artery V4 segment, although the right PICA origin remains patent. The distal right V4 appears occluded proximal to the vertebrobasilar junction (series 6, image 66). Patent basilar artery without stenosis. Patent SCA origins. Normal left PCA origin. Fetal type right PCA origin. Left posterior communicating artery is diminutive. The bilateral PCA branches appear patent with mild irregularity. Anterior circulation: Both ICA siphons are patent. There is moderate bilateral calcified plaque with tortuous cavernous segments. There is mild cavernous left ICA stenosis. There is mild to moderate stenosis of the right ICA siphon proximal cavernous and proximal supraclinoid segments. Normal ophthalmic and posterior communicating artery origins. Patent carotid termini. Patent MCA and left ACA origins. The left ACA is dominant and the right is diminutive or absent. The anterior communicating artery is ectatic. The bilateral ACA  branches are patent with mild irregularity. The right MCA M1 segment, bifurcation, and right MCA branches are  patent with mild irregularity. The left MCA M1 segment is patent. At the left MCA trifurcation and anterior division M2 branch is occluded as seen on series 11, image 137. Of the patent M2 branches, there are M3 branch occlusions demonstrated on series 14, image 29. Venous sinuses: Insufficient venous contrast. Anatomic variants: Dominant left vertebral artery. Dominant left ACA A1 segment. Review of the MIP images confirms the above findings IMPRESSION: 1. CTP estimates a middle Left MCA territory core infarct of 19 mL but only modest additional left MCA territory penumbra resulting in a relatively small mismatch estimated at 14 mL. 2. 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 Patent Left ICA and Left M1. 3. The above was discussed by telephone with Dr. Kerney Elbe on 10/10/2017 at 0906 hours. 4. Superimposed Left ICA calcified atherosclerosis with estimated 60% stenosis distal Left ICA bulb. Up to moderate contralateral right ICA siphon stenosis due to calcified plaque. 5. Dominant Left vertebral artery with mild to moderate origin stenosis. Patent basilar artery without stenosis. 6. Decreased enhancement of the Right Vertebral Artery relative to the left appears related to moderate to severe origin stenosis and occlusion of the distal right V4 segment. The right PICA origin remains patent. 7. Aortic Atherosclerosis (ICD10-I70.0) and Emphysema (ICD10-J43.9). Electronically Signed   By: Genevie Ann M.D.   On: 10/10/2017 09:30   Dg Chest Port 1 View  Result Date: 10/11/2017 CLINICAL DATA:  84 year old male with fever.  Initial encounter. EXAM: PORTABLE CHEST 1 VIEW COMPARISON:  07/23/2015 chest x-ray. FINDINGS: Pulmonary vascular congestion/pulmonary edema. Superimposed patchy consolidation left upper lobe and possibly right lung base may represent infectious process given patient's history of fever.  Heart size top-normal. Calcified aorta. IMPRESSION: Pulmonary vascular congestion/pulmonary edema. Superimposed patchy consolidation left upper lobe and possibly right lung base may represent infectious process given patient's history of fever. Follow-up until clearance recommended to exclude underlying mass. Aortic Atherosclerosis (ICD10-I70.0). Electronically Signed   By: Genia Del M.D.   On: 10/11/2017 11:15   Ct Head Code Stroke Wo Contrast  Result Date: 10/10/2017 CLINICAL DATA:  Code stroke.  84 year old male with aphasia. EXAM: CT HEAD WITHOUT CONTRAST TECHNIQUE: Contiguous axial images were obtained from the base of the skull through the vertex without intravenous contrast. COMPARISON:  Cervical spine MRI 11/24/2011. FINDINGS: Brain: No acute intracranial hemorrhage identified. No midline shift, mass effect, or evidence of intracranial mass lesion. Patchy bilateral cerebral white matter hypodensity, relatively mild for age. Some involvement of the basal ganglia, more so on the left. No cortically based acute hemispheric infarct identified, but there is patchy age indeterminate hypodensity in the central right cerebellum (series 7, image 7 and coronal image 54). No cortical encephalomalacia identified. Vascular: Calcified atherosclerosis at the skull base. No suspicious intracranial vascular hyperdensity. Skull: No acute osseous abnormality identified. Sinuses/Orbits: Well pneumatized. Other: Postoperative changes to both globes. No acute orbit or scalp soft tissue finding identified. ASPECTS [facility] Stroke Program Early CT Score) - Ganglionic level infarction (caudate, lentiform nuclei, internal capsule, insula, M1-M3 cortex): 7 - Supraganglionic infarction (M4-M6 cortex): 3 Total score (0-10 with 10 being normal): 10 IMPRESSION: 1. Age indeterminate mild patchy hypodensity in the right central cerebellum. No intracranial hemorrhage or acute cortically based infarct identified. 2. ASPECTS is 10. 3.  These results were communicated to Dr. Cheral Marker at 8:39 amon 5/6/2019by text page via the [facility] messaging system. Electronically Signed   By: Genevie Ann M.D.   On: 10/10/2017 08:40    2D echocardiogram - Left  ventricle: The cavity size was normal. Wall thickness was increased in a pattern of severe LVH. Systolic function was mildly reduced. The estimated ejection fraction was in the range of 45% to 50%. There is akinesis of the basallateral and inferior myocardium. Features are consistent with a pseudonormal left ventricular filling pattern, with concomitant abnormal relaxation and increased filling pressure (grade 2 diastolic dysfunction). - Aortic valve: There was trivial regurgitation. - Mitral valve: Calcified annulus. Impressions:   Small area of akinesis in the basal inferior and lateral walls with overall mildly reduced LV systolic function; severe LVH; moderate diastolic dysfunction; trace AI.  Lower Extremity Doppler Negative for deep and superficial vein thrombosis.   PHYSICAL EXAM HEENT: [place]/AT Lungs: mouth breathing. Breath sounds cleat to auscultation.  Cardiac: HRR Ext: Noncyanotic Neuro - awake alert comfortable, not following commands, PERRL, left gaze preference but able to cross midline LUE 4/5, RUE 3/5. LLE withdraw against gravity, RLE 3/5 on pain stimulation.    ASSESSMENT/PLAN Mr. IVAL PACER is a 84 y.o. male with history of hypertension, hyperlipidemia, coronary artery disease, chronic kidney disease, diabetes, Parkinson's, first-degree heart block and known PFO found down in the bathroom presenting with right hemiparesis and global aphasia.   Stroke:   Left lateral frontal/mid insular infarct and small occipital infarct, appears embolic secondary to unknown source  -Continue ASA -According to the wife, most likely they will proceed with comfort care/ hospice. -Decrease Neuro exam to Q Shift so he can rest at night -Palliative team is following, appreciate their  help -I will sign off, please call us with any questions.   Hospital day # [place], [place]       To contact Stroke Continuity provider, please refer to [email or web address] After hours, contact General Neurology

## 2017-10-16 NOTE — Progress Notes (Signed)
Daily Progress Note   Patient Name: Ronald Benitez       Date: 10/16/2017 DOB: 09-23-1928  Age: 84 y.o. MRN#: [id number] Attending Physician: Elmarie Shiley, MD Primary Care Physician: [name], MD Admit Date: 10/10/2017  Reason for Consultation/Follow-up: Establishing goals of care, Non pain symptom management, Pain control and Psychosocial/spiritual support  Subjective: Patient resting comfortable upon arrival to room. Audible secretions.   GOC:   Palliative f/u with multiple family members including wife, two sons Ronald Benitez and Ronald Benitez), DIL Ronald Benitez), and granddaughter Ronald Benitez). Again discussed hospital diagnoses, interventions, and underlying co-morbidities at length. Discussed concern with failed swallow test and high risk for aspiration. Discussed at length benefits/risk of feeding tubes versus comfort feeds.   Family spends time sharing stories regarding his independence and stubbornness. They speak of the challenges of him losing his independence in the last few years with progressive Parkinson's. Ronald Benitez speaks of him being unhappy with his quality of life for a long time.   All family members agree that he would not want prolonged measures if this would be his quality of life.   Ronald Benitez tells me his mother has made the decision against feeding tube.   We discussed transition to comfort care including focus on comfort, quality, and dignity at EOL. We discussed prognosis of likely weeks or less with poor nutritional status and high risk for aspiration with any intake. Educated on medications as needed to ensure comfort and relief from suffering. Wife verbalizes her wish to focus on his "comfort" and being ready for transition to hospice services.   Discussed hospice  philosophy and options. Family requesting hospice facility.   Emotional/spiritual support provided. Answered questions and concerns.    Length of Stay: 6  Current Medications: Scheduled Meds:  . aspirin  300 mg Rectal Daily   Or  . aspirin  325 mg Oral Daily  . atorvastatin  80 mg Oral q1800  . chlorhexidine  15 mL Mouth Rinse BID  . enoxaparin (LOVENOX) injection  30 mg Subcutaneous Q24H  . insulin aspart  0-9 Units Subcutaneous Q4H  . LORazepam  0.5 mg Intravenous QHS  . mouth rinse  15 mL Mouth Rinse BID    Continuous Infusions: . ampicillin-sulbactam (UNASYN) IV Stopped (10/16/17 1242)  . dextrose 50 mL/hr at 10/16/17 0002    PRN  Meds: acetaminophen **OR** acetaminophen (TYLENOL) oral liquid 160 mg/5 mL **OR** acetaminophen, bisacodyl, glycopyrrolate  Physical Exam  Constitutional: He is easily aroused. He appears ill.  HENT:  Head: Normocephalic and atraumatic.  Cardiovascular: Normal rate.  Pulmonary/Chest: No accessory muscle usage. No tachypnea. No respiratory distress.  Audible secretions  Abdominal: Soft.  Neurological: He is easily aroused.  Patient is intermittently able to follow simple commands He is nonverbal but pointing and squeezing family's hands sometimes appearing purposeful  Skin: Skin is warm and dry.  Psychiatric: Cognition and memory are impaired. He is noncommunicative. He is inattentive.  Nursing note and vitals reviewed.          Vital Signs: BP (!) 147/73 (BP Location: Right Arm)   Pulse (!) 56   Temp 98.3 F (36.8 C) (Axillary)   Resp (!) 22   Ht 5\' 11"  (1.803 m)   Wt 76.8 kg (169 lb 5 oz)   SpO2 91%   BMI 23.61 kg/m  SpO2: SpO2: 91 % O2 Device: O2 Device: Nasal Cannula O2 Flow Rate: O2 Flow Rate (L/min): 2 L/min  Intake/output summary:   Intake/Output Summary (Last 24 hours) at 10/16/2017 1251 Last data filed at 10/16/2017 0800 Gross per 24 hour  Intake 3245 ml  Output 350 ml  Net 2895 ml   LBM: Last BM Date:  10/13/17 Baseline Weight: Weight: 76.8 kg (169 lb 5 oz) Most recent weight: Weight: 76.8 kg (169 lb 5 oz)       Palliative Assessment/Data: PPS 20%   Flowsheet Rows     Most Recent Value  Intake Tab  Referral Department  Hospitalist  Unit at Time of Referral  Med/Surg Unit  Palliative Care Primary Diagnosis  Neurology  Palliative Care Type  New Palliative care  Reason for referral  Clarify Goals of Care  Date first seen by Palliative Care  10/13/17  Clinical Assessment  Palliative Performance Scale Score  20%  Psychosocial & Spiritual Assessment  Palliative Care Outcomes  Patient/Family meeting held?  Yes  Who was at the meeting?  wife, four adult children  Palliative Care Outcomes  Clarified goals of care, Provided end of life care assistance, Provided psychosocial or spiritual support, Improved non-pain symptom therapy, Counseled regarding hospice, ACP counseling assistance      Patient Active Problem List   Diagnosis Date Noted  . Palliative care by specialist   . Goals of care, counseling/discussion   . Agitation   . Increased oropharyngeal secretions   . Parkinson's disease ([place])   . Dysphagia   . Acute ischemic stroke ([place])   . CVA (cerebral vascular accident) ([place]) 10/10/2017  . Elevated troponin   . Abnormal CT of the abdomen 12/06/2016  . Change in bowel habits 12/06/2016  . CN (constipation) 12/06/2016  . COPD, mild ([place]) 04/09/2014  . Unstable angina ([place]) 01/17/2014  . Mitral regurgitation   . GERD (gastroesophageal reflux disease)   . DM (diabetes mellitus) ([place])   . Asymptomatic LV dysfunction   . CAD (coronary artery disease) 01/15/2014  . Abnormal nuclear cardiac imaging test 01/14/2014  . Chronic renal disease, stage III ([place]) 01/14/2014  . Sinus bradycardia- HR 43 01/14/2014  . Essential hypertension 11/16/2013  . Hyperlipidemia 11/16/2013  . Depressive disorder, not elsewhere classified 05/08/2013    Palliative Care Assessment & Plan    Patient Profile: 84 y.o.malewith past medical history of Parkinson's disease, DM, HTN, HLD, MI s/p stents, hiatal hernia, GERD, depression, COPD, CKD, CAD, BPH, arthritis, first degree heart blockadmitted  on 5/6/2019after he was found down by his wife around 7am. Patient noted to have right sided hemiplegia and neglect. Neurology following.MRI revealed left lateral frontal/mid insular infarct and small occipital infarct. Patient with right upper and lower extremity weakness and global aphasia.Cardiology following for elevated troponin likely demand ischemia. Echo revealed mild LV dysfunction with EF 45-50% with grade 2 diastolic dysfunction. Not a candidate for cardiac cath secondary to acute CVA. Cardiology is recommending aspirin and high dose statin.Patient failed speech swallow evaluation on 5/8 with high risk for aspiration. Family considering PEG versus short term feeding tube. Palliative medicine consultation for goals of care.  Assessment: Acute stroke Parkinson's disease Dysphagia High risk for aspiration Aphasia Intermittent agitation Pneumonia Elevated troponin-likely demand ischemia  Recommendations/Plan:  Wife and all family members have discussed and confirmed decision AGAINST feeding tube.   DNR/DNI  Transition to comfort focused care with goal of comfort, quality, and dignity at EOL.   Comfort feeds per patient family request--ice and swabs will be safest.  Symptom management  Continue Ativan 0.5mg  HS scheduled  Morphine 1mg  IV q3h prn pain/dyspnea/air hunger  Tylenol as needed for pain  Ativan 0.5mg  IV q4h prn anxiety/agitation  Robinul 0.2mg  IV q6h prn secretions  Scopolamine patch  SW consulted for residential hospice placement. Family requesting [facility] as first choice. Discussed with Kathlee Nations, SW via telephone. No BP beds today.   PMT will follow.   Code Status: DNR/DNI    Code Status Orders  (From admission, onward)        Start      Ordered   10/12/17 1040  Do not attempt resuscitation (DNR)  Continuous    Question Answer Comment  In the event of cardiac or respiratory ARREST Do not call a "code blue"   In the event of cardiac or respiratory ARREST Do not perform Intubation, CPR, defibrillation or ACLS   In the event of cardiac or respiratory ARREST Use medication by any route, position, wound care, and other measures to relive pain and suffering. May use oxygen, suction and manual treatment of airway obstruction as needed for comfort.      10/12/17 1039    Code Status History    Date Active Date Inactive Code Status Order ID Comments User Context   10/10/2017 1235 10/12/2017 1039 Full Code [id number]  Karmen Bongo, MD ED   01/16/2014 1754 01/17/2014 1353 Full Code [id number]  Troy Sine, MD Inpatient   01/15/2014 1639 01/16/2014 1754 Full Code [id number]  Lorretta Harp, MD Inpatient   01/14/2014 1644 01/15/2014 1639 Full Code [id number]  Erlene Quan, PA-C Inpatient       Prognosis: Likely weeks or less with acute stroke, severe dysphagia, high risk of aspiration, and underlying Parkinson's disease. High risk for decompensation and high symptom burden with intermittent agitation, audible secretions, and dyspnea. Family opting for comfort focused care.   Discharge Planning:  Hospice facility   Thank you for allowing the Palliative Medicine Team to assist in the care of this patient.   Time In: 1100 Time Out: 1230 Total Time 90 min Prolonged Time Billed  yes       Greater than 50%  of this time was spent counseling and coordinating care related to the above assessment and plan.  Ihor Dow, FNP-C Palliative Medicine Team  Phone: [phone] Fax: [phone]  Please contact Palliative Medicine Team phone at [phone] for questions and concerns.

## 2017-10-17 NOTE — Progress Notes (Signed)
Nutrition Brief Note  Chart reviewed. Pt now transitioning to comfort care.  No further nutrition interventions warranted at this time.  Please re-consult as needed.   Jazilyn Siegenthaler A. Heaven Meeker, RD, LDN, CDE Pager: [phone] After hours Pager: [phone]  

## 2017-10-17 NOTE — Progress Notes (Signed)
Daily Progress Note   Patient Name: Ronald Benitez       Date: 10/17/2017 DOB: Oct 30, 1928  Age: 84 y.o. MRN#: [id number] Attending Physician: Elmarie Shiley, MD Primary Care Physician: [name], MD Admit Date: 10/10/2017  Reason for Consultation/Follow-up: Establishing goals of care, Non pain symptom management, Pain control and Psychosocial/spiritual support  Subjective/GOC: Upon arrival to room, patient awake and alert. Non-verbal. Will point at family members at bedside. No s/s of pain or discomfort.   Son, Ronald Benitez, and daughter, Ronald Benitez at bedside. Answered questions and offered support. Wife has gone home to rest for the afternoon. They speak of her still trying to process comfort measures and him nearing the EOL. Hospice liaison has spoken with family today and updated them that a bed is not yet available.   Length of Stay: 7  Current Medications: Scheduled Meds:  . aspirin  300 mg Rectal Daily   Or  . aspirin  325 mg Oral Daily  . chlorhexidine  15 mL Mouth Rinse BID  . LORazepam  0.5 mg Intravenous QHS  . mouth rinse  15 mL Mouth Rinse BID  . scopolamine  1 patch Transdermal Q72H    Continuous Infusions: . dextrose 50 mL/hr at 10/17/17 1350    PRN Meds: acetaminophen **OR** acetaminophen (TYLENOL) oral liquid 160 mg/5 mL **OR** acetaminophen, bisacodyl, glycopyrrolate, LORazepam, morphine injection  Physical Exam  Constitutional: He appears ill.  HENT:  Head: Normocephalic and atraumatic.  Cardiovascular: Normal rate.  Pulmonary/Chest: No accessory muscle usage. No tachypnea. No respiratory distress.  Abdominal: Soft.  Neurological: He is alert.  Patient is intermittently able to follow simple commands He is nonverbal but pointing and squeezing  family's hands sometimes appearing purposeful  Skin: Skin is warm and dry.  Psychiatric: Cognition and memory are impaired. He is noncommunicative. He is inattentive.  Nursing note and vitals reviewed.          Vital Signs: BP (!) 146/66 (BP Location: Left Arm)   Pulse (!) 54   Temp (!) 97.3 F (36.3 C) (Oral)   Resp 19   Ht 5\' 11"  (1.803 m)   Wt 76.8 kg (169 lb 5 oz)   SpO2 99%   BMI 23.61 kg/m  SpO2: SpO2: 99 % O2 Device: O2 Device: Room Air O2 Flow Rate: O2 Flow Rate (  L/min): 2 L/min  Intake/output summary:   Intake/Output Summary (Last 24 hours) at 10/17/2017 1358 Last data filed at 10/17/2017 1245 Gross per 24 hour  Intake 1200 ml  Output 1300 ml  Net -100 ml   LBM: Last BM Date: 10/13/17 Baseline Weight: Weight: 76.8 kg (169 lb 5 oz) Most recent weight: Weight: 76.8 kg (169 lb 5 oz)       Palliative Assessment/Data: PPS 20%   Flowsheet Rows     Most Recent Value  Intake Tab  Referral Department  Hospitalist  Unit at Time of Referral  Med/Surg Unit  Palliative Care Primary Diagnosis  Neurology  Palliative Care Type  New Palliative care  Reason for referral  Clarify Goals of Care  Date first seen by Palliative Care  10/13/17  Clinical Assessment  Palliative Performance Scale Score  20%  Psychosocial & Spiritual Assessment  Palliative Care Outcomes  Patient/Family meeting held?  Yes  Who was at the meeting?  wife, four adult children  Palliative Care Outcomes  Clarified goals of care, Provided end of life care assistance, Provided psychosocial or spiritual support, Improved non-pain symptom therapy, Counseled regarding hospice, ACP counseling assistance      Patient Active Problem List   Diagnosis Date Noted  . Terminal care   . At high risk for aspiration   . Palliative care by specialist   . Goals of care, counseling/discussion   . Agitation   . Increased oropharyngeal secretions   . Parkinson's disease ([place])   . Dysphagia   . Acute ischemic stroke  ([place])   . CVA (cerebral vascular accident) ([place]) 10/10/2017  . Elevated troponin   . Abnormal CT of the abdomen 12/06/2016  . Change in bowel habits 12/06/2016  . CN (constipation) 12/06/2016  . COPD, mild ([place]) 04/09/2014  . Unstable angina ([place]) 01/17/2014  . Mitral regurgitation   . GERD (gastroesophageal reflux disease)   . DM (diabetes mellitus) ([place])   . Asymptomatic LV dysfunction   . CAD (coronary artery disease) 01/15/2014  . Abnormal nuclear cardiac imaging test 01/14/2014  . Chronic renal disease, stage III ([place]) 01/14/2014  . Sinus bradycardia- HR 43 01/14/2014  . Essential hypertension 11/16/2013  . Hyperlipidemia 11/16/2013  . Depressive disorder, not elsewhere classified 05/08/2013    Palliative Care Assessment & Plan   Patient Profile: 84 y.o.malewith past medical history of Parkinson's disease, DM, HTN, HLD, MI s/p stents, hiatal hernia, GERD, depression, COPD, CKD, CAD, BPH, arthritis, first degree heart blockadmitted on 5/6/2019after he was found down by his wife around 7am. Patient noted to have right sided hemiplegia and neglect. Neurology following.MRI revealed left lateral frontal/mid insular infarct and small occipital infarct. Patient with right upper and lower extremity weakness and global aphasia.Cardiology following for elevated troponin likely demand ischemia. Echo revealed mild LV dysfunction with EF 45-50% with grade 2 diastolic dysfunction. Not a candidate for cardiac cath secondary to acute CVA. Cardiology is recommending aspirin and high dose statin.Patient failed speech swallow evaluation on 5/8 with high risk for aspiration. Family considering PEG versus short term feeding tube. Palliative medicine consultation for goals of care.  Assessment: Acute stroke Parkinson's disease Dysphagia High risk for aspiration Aphasia Intermittent agitation Pneumonia Elevated troponin-likely demand ischemia  Recommendations/Plan:  Wife and all family  members have discussed and confirmed decision AGAINST feeding tube.   DNR/DNI  Transition to comfort focused care with goal of comfort, quality, and dignity at EOL.   Comfort feeds per patient family request--ice and swabs will be  safest.  Symptom management  Continue Ativan 0.5mg  HS scheduled  Morphine 1mg  IV q3h prn pain/dyspnea/air hunger  Tylenol as needed for pain  Ativan 0.5mg  IV q4h prn anxiety/agitation  Robinul 0.2mg  IV q6h prn secretions  Scopolamine patch  Waiting on hospice facility bed. SW following.   Code Status: DNR/DNI    Code Status Orders  (From admission, onward)        Start     Ordered   10/12/17 1040  Do not attempt resuscitation (DNR)  Continuous    Question Answer Comment  In the event of cardiac or respiratory ARREST Do not call a "code blue"   In the event of cardiac or respiratory ARREST Do not perform Intubation, CPR, defibrillation or ACLS   In the event of cardiac or respiratory ARREST Use medication by any route, position, wound care, and other measures to relive pain and suffering. May use oxygen, suction and manual treatment of airway obstruction as needed for comfort.      10/12/17 1039    Code Status History    Date Active Date Inactive Code Status Order ID Comments User Context   10/10/2017 1235 10/12/2017 1039 Full Code [id number]  Karmen Bongo, MD ED   01/16/2014 1754 01/17/2014 1353 Full Code [id number]  Troy Sine, MD Inpatient   01/15/2014 1639 01/16/2014 1754 Full Code [id number]  Lorretta Harp, MD Inpatient   01/14/2014 1644 01/15/2014 1639 Full Code [id number]  Erlene Quan, PA-C Inpatient       Prognosis: Likely weeks or less with acute stroke, severe dysphagia, high risk of aspiration, and underlying Parkinson's disease. High risk for decompensation and high symptom burden with intermittent agitation, audible secretions, and dyspnea. Family opting for comfort focused care.   Discharge Planning:  Hospice  facility   Thank you for allowing the Palliative Medicine Team to assist in the care of this patient.   Time In: 1345 Time Out: 1400 Total Time 62min Prolonged Time Billed no      Greater than 50%  of this time was spent counseling and coordinating care related to the above assessment and plan.  Ihor Dow, FNP-C Palliative Medicine Team  Phone: [phone] Fax: [phone]  Please contact Palliative Medicine Team phone at [phone] for questions and concerns.

## 2017-10-17 NOTE — Care Management Important Message (Signed)
Important Message  Patient Details  Name: Ronald Benitez MRN: [id number] Date of Birth: 07/16/1928   Medicare Important Message Given:  Yes    Chena Chohan [address] 10/17/2017, 3:47 PM

## 2017-10-17 NOTE — Clinical Social Work Note (Signed)
Clinical Social Work Assessment  Patient Details  Name: Ronald Benitez MRN: [id number] Date of Birth: February 14, 1929  Date of referral:  10/16/17               Reason for consult:  Facility Placement                Permission sought to share information with:  Facility Sport and exercise psychologist, Family Supports Permission granted to share information::  Yes, Verbal Permission Granted  Name::     Leretha Dykes  Agency::  [facility]  Relationship::  Wife, Daughter  Sport and exercise psychologist Information:     Housing/Transportation Living arrangements for the past 2 months:  Single Family Home Source of Information:  Adult Children, Spouse Patient Interpreter Needed:  None Criminal Activity/Legal Involvement Pertinent to Current Situation/Hospitalization:  No - Comment as needed Significant Relationships:  Adult Children, Spouse Lives with:  Self Do you feel safe going back to the place where you live?  Yes Need for family participation in patient care:  Yes (Comment)  Care giving concerns:  Patient is at end of life and family has chosen to pursue residential hospice placement.   Social Worker assessment / plan:  CSW alerted by PMT that patient's family has decided not to pursue artificial feeding and would like to focus on comfort measures. CSW met with patient's family at bedside to introduce self and discuss role, as well as present options for residential hospice. CSW provided information that [facility] has no beds available, but will send referral and continue to follow.  Employment status:  Retired Nurse, adult PT Recommendations:  [place] / Referral to community resources:  [place]  Patient/Family's Response to care:  Patient's family agreeable to residential hospice placement.  Patient/Family's Understanding of and Emotional Response to Diagnosis, Current Treatment, and Prognosis:  Patient's wife and daughter  discussed how they struggled with the decision on what to do for the patient, and are not ready to let him go but know that he would not bounce back from this in any meaningful way. The family just wants the patient to be comfortable. Patient's family appreciative of CSW assistance.  Emotional Assessment Appearance:  Appears stated age Attitude/Demeanor/Rapport:  Unable to Assess Affect (typically observed):  Unable to Assess Orientation:    Alcohol / Substance use:  Not Applicable Psych involvement (Current and /or in the community):  No (Comment)  Discharge Needs  Concerns to be addressed:  Care Coordination Readmission within the last 30 days:  No Current discharge risk:  Terminally ill Barriers to Discharge:  Continued Medical Work up, [facility] not available, [place], [place] 10/17/2017, 9:39 PM

## 2017-10-17 NOTE — Progress Notes (Signed)
PT Cancellation Note  Patient Details Name: Ronald Benitez MRN: [id number] DOB: [birth year]   Cancelled Treatment:    Reason Eval/Treat Not Completed: Other (comment)(Note PT discontinued due to pt now comfort care. Sign off.)   Denice Paradise 10/17/2017, 8:27 AM  [facility] [phone] (pager)

## 2017-10-17 NOTE — Consult Note (Signed)
[facility])  Received request from [place] for family interest in [facility]. Chart reviewed and met with patient's daughter briefly at bedside to acknowledge referral. Patient's spouse not present and is who we need to speak with. Daughter is aware [facility] is not able to offer a room today and that we will follow up again if that changes. Made CSW Kathlee Nations aware.   Thank you,  Erling Conte, LCSW [phone]

## 2017-10-17 NOTE — Progress Notes (Signed)
  Speech Language Pathology Treatment: Dysphagia  Patient Details Name: Ronald Benitez MRN: [id number] DOB: Nov 11, 1928 Today's Date: 10/17/2017 Time: 1000-1010 SLP Time Calculation (min) (ACUTE ONLY): 10 min  Assessment / Plan / Recommendation Clinical Impression  SLP met with wife and daughter to answer any further questions they might have. Pt was asleep and not appropriate for ice chips at this time. Both feel peaceful with decisions and are hopeful pt can discharge to [facility] should a bed become available. ST to sign off at this time.    HPI HPI: Ronald Benitez a 84 y.o.malewith medical history significant ofDM; CAD s/p PCI; Parkinson's disease; HTN; HLD; depression; COPD; and stage 3 CKD presenting with significant neurologic deficits thought to be related to a stroke.This morning, his wife found that he had fallen in the bathroom. She is unsure when it happened - she found him about 715. She left him about 1AM and he was normal in his recliner. His walker was outside the bathroom when she found him. No urinary or fecal incontinence. He has a scalp bruise. He was his normal self yesterday without difficulty. Ambulates with his walker most of the time in the house. No aspiration concerns normally although he does have a lot of phlegm. Code stroke - awake, aphasic, trying to communicate but can't; right neglect, right hemiplegia. Acute/early subacute infarction involving the left lateral frontal lobe and mid insula. Additional small focus of acute/early subacute infarction within the left occipitotemporal junction. No associated hemorrhage or mass effect.      SLP Plan  Discharge SLP treatment due to (comment)(Comfort care)       Recommendations  Diet recommendations: NPO(Ice chips as alert, comfort) Medication Administration: Via alternative means                Oral Care Recommendations: Oral care QID Follow up Recommendations: Skilled Nursing  facility SLP Visit Diagnosis: Dysphagia, oropharyngeal phase (R13.12) Plan: Discharge SLP treatment due to (comment)(Comfort care)       GO                Ronald Benitez 10/17/2017, 11:07 AM

## 2017-10-17 NOTE — Progress Notes (Signed)
PROGRESS NOTE    Ronald Benitez  YTK:[id number] DOB: Apr 28, 1929 DOA: 10/10/2017 PCP: [name], MD   Brief Narrative: 84 yo M with good prior functional status, history of DM, CAD s/p PCI, Parkinson's ds, HTN, HLD, CKD who was brought to the hospital on 5/6 after being found down in the bathroom by his wife around 7 am. He was noted to have right sided hemiplegia and neglect. Neurology was consulted in the ED. Cardiology was consulted as well for positive troponin and ST segment changes on EKG. No reports of chest discomfort prior to this episode.    Assessment & Plan:   Principal Problem:   CVA (cerebral vascular accident) ([place]) Active Problems:   Essential hypertension   Hyperlipidemia   Chronic renal disease, stage III (HCC)   CAD (coronary artery disease)   DM (diabetes mellitus) ([place])   Elevated troponin   Acute ischemic stroke ([place])   Parkinson's disease ([place])   Dysphagia   Palliative care by specialist   Goals of care, counseling/discussion   Agitation   Increased oropharyngeal secretions   Terminal care   At high risk for aspiration  1-Acute CVA -MRI on admission showed acute/early subacute infarction involving the left lateral frontal lobe and mid insula. Additional small focus of acute/early subacute infarction within the left occipitotemporal junction. No associated hemorrhage or mass effect. -neurology following.  -ECHO ef lower 45 %, wall motion abnormalities.  -LDL 61 A1-c 8.1 -Aspirin per rectum.  -Fail swallow evaluation. Family unsure about NG tube,  Palliative met with  Family and discussed goals. Family has not made decision regarding tube feeding. They seem to be clear regarding not to pursue PEG tube -global aphasia, moves left upper extremities.  Wife and family has chosen for comfort care, hospice. Support provided.  On ativan PRN for agitation.   2-PNA; Fever, chest x ray with patchy consolidation left upper lobe and possible right  bases. Started IV antibiotics to cover for PNA.  Received 5 days of antibiotic.  3-Elevation troponin;  ECHO with wall motion abnormalities.  Cardiology following. Await rec.  Peak to 1.9. Thought to be related to CVA.  No further cardiac evaluation at this time due to recent stroke.   DM; SSI.   CKD stage IV; Cr at 2.3 stable. Continue to monitor.  No further labs check.   HTN;  Permissive HTN.   Parkinson's Diseases;  Medications on hold due to NPO status.   DVT prophylaxis: lovenox Code Status: DNR.  Family Communication: family has opted for not tube feeding,hospcie.  Disposition Plan: SW consulted for residential hospice.   Consultants:   Neurology  Cardiology   Palliative.      Procedures:   ECHO;    Antimicrobials:  Unasyn 5-08   Subjective: Sleepy, received ativan, was agitated    Objective: Vitals:   10/16/17 1311 10/16/17 1700 10/16/17 2040 10/17/17 1234  BP: (!) 164/72 (!) 147/70 (!) 147/67 (!) 146/66  Pulse: 64 65 65 (!) 54  Resp: 20 20 16 19   Temp: 98.2 F (36.8 C) 98.2 F (36.8 C) 97.9 F (36.6 C) (!) 97.3 F (36.3 C)  TempSrc: Axillary Axillary Oral Oral  SpO2: 96% 100% 100% 99%  Weight:      Height:        Intake/Output Summary (Last 24 hours) at 10/17/2017 1525 Last data filed at 10/17/2017 1350 Gross per 24 hour  Intake 1260 ml  Output 1300 ml  Net -40 ml   [employer]  10/10/17 0906  Weight: 76.8 kg (169 lb 5 oz)    Examination:  General exam: NAD Respiratory system; CTA Cardiovascular system: S 1, S 2 RRR Gastrointestinal system; BS present, soft, nt Central nervous system; aphasic, lethargic Extremities: no edema     Data Reviewed: I have personally reviewed following labs and imaging studies  CBC: Recent Labs  Lab 10/12/17 0534 10/13/17 0653  WBC 8.4 8.2  HGB 11.2* 11.1*  HCT 35.1* 35.0*  MCV 95.1 94.6  PLT 212 092   Basic Metabolic Panel: Recent Labs  Lab 10/12/17 0534 10/13/17 0653  10/15/17 0334  NA 143 144 145  K 4.1 4.0 4.0  CL 112* 113* 113*  CO2 21* 22 24  GLUCOSE 136* 121* 134*  BUN 41* 38* 36*  CREATININE 2.35* 2.30* 1.98*  CALCIUM 9.1 9.1 9.3   GFR: Estimated Creatinine Clearance: 28 mL/min (A) (by C-G formula based on SCr of 1.98 mg/dL (H)). Liver Function Tests: No results for input(s): AST, ALT, ALKPHOS, BILITOT, PROT, ALBUMIN in the last 168 hours. No results for input(s): LIPASE, AMYLASE in the last 168 hours. No results for input(s): AMMONIA in the last 168 hours. Coagulation Profile: No results for input(s): INR, PROTIME in the last 168 hours. Cardiac Enzymes: Recent Labs  Lab 10/10/17 1804 10/11/17 0019 10/11/17 0621 10/11/17 1214 10/11/17 1733  TROPONINI 0.59* 1.34* 1.71* 1.97* 1.79*   BNP (last 3 results) No results for input(s): PROBNP in the last 8760 hours. HbA1C: No results for input(s): HGBA1C in the last 72 hours. CBG: Recent Labs  Lab 10/16/17 0059 10/16/17 0416 10/16/17 0754 10/16/17 1132 10/16/17 2048  GLUCAP 111* 112* 114* 102* 116*   Lipid Profile: No results for input(s): CHOL, HDL, LDLCALC, TRIG, CHOLHDL, LDLDIRECT in the last 72 hours. Thyroid Function Tests: No results for input(s): TSH, T4TOTAL, FREET4, T3FREE, THYROIDAB in the last 72 hours. Anemia Panel: No results for input(s): VITAMINB12, FOLATE, FERRITIN, TIBC, IRON, RETICCTPCT in the last 72 hours. Sepsis Labs: No results for input(s): PROCALCITON, LATICACIDVEN in the last 168 hours.  No results found for this or any previous visit (from the past 240 hour(s)).       Radiology Studies: No results found.      Scheduled Meds: . aspirin  300 mg Rectal Daily   Or  . aspirin  325 mg Oral Daily  . chlorhexidine  15 mL Mouth Rinse BID  . LORazepam  0.5 mg Intravenous QHS  . mouth rinse  15 mL Mouth Rinse BID  . scopolamine  1 patch Transdermal Q72H   Continuous Infusions: . dextrose 50 mL/hr at 10/17/17 1350     LOS: 7 days    Time  spent: 35 minutes.     Elmarie Shiley, MD [facility] Pager [phone]  If 7PM-7AM, please contact night-coverage [email or web address] Password [facility] 10/17/2017, 3:25 PM

## 2017-10-18 NOTE — Care Management Note (Signed)
Case Management Note  Patient Details  Name: Ronald Benitez MRN: [id number] Date of Birth: 10/26/1928  Subjective/Objective:                    Action/Plan: Plan is for residential hospice. CM following.   Expected Discharge Date:                  Expected Discharge Plan:  [place]  In-House Referral:  Clinical Social Work, Hospice / Palliative Care  Discharge planning Services  CM Consult  Post Acute Care Choice:    Choice offered to:     DME Arranged:    DME Agency:     HH Arranged:    HH Agency:     Status of Service:  In process, will continue to follow  If discussed at Long Length of Stay Meetings, dates discussed:    Additional Comments:  Pollie Friar, RN 10/18/2017, 10:32 AM

## 2017-10-18 NOTE — Progress Notes (Signed)
Clinical Social Worker following patient  For support and discharge needs. CSW received phone call from [facility] and they stated at this time they do not have a bed available for patient. RNCM spoke to patients son to see if family would be agreeable with another hospice facility. Son stated that he would have to speak to family to see if they would be agreeable to a Hospice facility in [facility] since patients wife would have to drive to facility. CSW on the floor will continue to follow patient for support.   Rhea Pink, MSW,  [place]

## 2017-10-18 NOTE — Progress Notes (Signed)
Daily Progress Note   Patient Name: Ronald Benitez       Date: 10/18/2017 DOB: [birth year]  Age: 84 y.o. MRN#: [id number] Attending Physician: Elmarie Shiley, MD Primary Care Physician: [name], MD Admit Date: 10/10/2017  Reason for Consultation/Follow-up: Establishing goals of care, Non pain symptom management, Pain control and Psychosocial/spiritual support  Subjective/GOC: Upon arrival to room, patient awake and alert. Non-verbal. Will point at family members at bedside. No s/s of pain or discomfort.   Wife and son Richardson Landry) at bedside. Wife tells me he was more comfortable last night then the previous night. We again discussed medication regimen to ensure comfort. Waiting on hospice facility bed. Emotional/spiritual support provided to family.   Length of Stay: 8  Current Medications: Scheduled Meds:  . aspirin  300 mg Rectal Daily   Or  . aspirin  325 mg Oral Daily  . chlorhexidine  15 mL Mouth Rinse BID  . LORazepam  0.5 mg Intravenous QHS  . mouth rinse  15 mL Mouth Rinse BID  . scopolamine  1 patch Transdermal Q72H    Continuous Infusions: . dextrose 1,000 mL (10/17/17 2141)    PRN Meds: acetaminophen **OR** acetaminophen (TYLENOL) oral liquid 160 mg/5 mL **OR** acetaminophen, bisacodyl, glycopyrrolate, LORazepam, morphine injection  Physical Exam  Constitutional: He appears ill.  HENT:  Head: Normocephalic and atraumatic.  Cardiovascular: Normal rate.  Pulmonary/Chest: No accessory muscle usage. No tachypnea. No respiratory distress.  Abdominal: Soft.  Neurological: He is alert.  Patient is intermittently able to follow simple commands He is nonverbal but pointing and squeezing family's hands sometimes appearing purposeful  Skin: Skin is warm and  dry.  Psychiatric: Cognition and memory are impaired. He is noncommunicative. He is inattentive.  Nursing note and vitals reviewed.          Vital Signs: BP (!) 144/66 (BP Location: Left Arm)   Pulse 64   Temp 97.7 F (36.5 C) (Oral)   Resp 16   Ht 5\' 11"  (1.803 m)   Wt 76.8 kg (169 lb 5 oz)   SpO2 94%   BMI 23.61 kg/m  SpO2: SpO2: 94 % O2 Device: O2 Device: Room Air O2 Flow Rate: O2 Flow Rate (L/min): 2 L/min  Intake/output summary:  No intake or output data in the 24 hours ending 10/18/17 1448  LBM: Last BM Date: 10/13/17 Baseline Weight: Weight: 76.8 kg (169 lb 5 oz) Most recent weight: Weight: 76.8 kg (169 lb 5 oz)       Palliative Assessment/Data: PPS 20%   Flowsheet Rows     Most Recent Value  Intake Tab  Referral Department  Hospitalist  Unit at Time of Referral  Med/Surg Unit  Palliative Care Primary Diagnosis  Neurology  Palliative Care Type  New Palliative care  Reason for referral  Clarify Goals of Care  Date first seen by Palliative Care  10/13/17  Clinical Assessment  Palliative Performance Scale Score  20%  Psychosocial & Spiritual Assessment  Palliative Care Outcomes  Patient/Family meeting held?  Yes  Who was at the meeting?  wife, four adult children  Palliative Care Outcomes  Clarified goals of care, Provided end of life care assistance, Provided psychosocial or spiritual support, Improved non-pain symptom therapy, Counseled regarding hospice, ACP counseling assistance      Patient Active Problem List   Diagnosis Date Noted  . Terminal care   . At high risk for aspiration   . Palliative care by specialist   . Goals of care, counseling/discussion   . Agitation   . Increased oropharyngeal secretions   . Parkinson's disease ([place])   . Dysphagia   . Acute ischemic stroke ([place])   . CVA (cerebral vascular accident) ([place]) 10/10/2017  . Elevated troponin   . Abnormal CT of the abdomen 12/06/2016  . Change in bowel habits 12/06/2016  . CN  (constipation) 12/06/2016  . COPD, mild ([place]) 04/09/2014  . Unstable angina ([place]) 01/17/2014  . Mitral regurgitation   . GERD (gastroesophageal reflux disease)   . DM (diabetes mellitus) ([place])   . Asymptomatic LV dysfunction   . CAD (coronary artery disease) 01/15/2014  . Abnormal nuclear cardiac imaging test 01/14/2014  . Chronic renal disease, stage III ([place]) 01/14/2014  . Sinus bradycardia- HR 43 01/14/2014  . Essential hypertension 11/16/2013  . Hyperlipidemia 11/16/2013  . Depressive disorder, not elsewhere classified 05/08/2013    Palliative Care Assessment & Plan   Patient Profile: 84 y.o.malewith past medical history of Parkinson's disease, DM, HTN, HLD, MI s/p stents, hiatal hernia, GERD, depression, COPD, CKD, CAD, BPH, arthritis, first degree heart blockadmitted on 5/6/2019after he was found down by his wife around 7am. Patient noted to have right sided hemiplegia and neglect. Neurology following.MRI revealed left lateral frontal/mid insular infarct and small occipital infarct. Patient with right upper and lower extremity weakness and global aphasia.Cardiology following for elevated troponin likely demand ischemia. Echo revealed mild LV dysfunction with EF 45-50% with grade 2 diastolic dysfunction. Not a candidate for cardiac cath secondary to acute CVA. Cardiology is recommending aspirin and high dose statin.Patient failed speech swallow evaluation on 5/8 with high risk for aspiration. Family considering PEG versus short term feeding tube. Palliative medicine consultation for goals of care.  Assessment: Acute stroke Parkinson's disease Dysphagia High risk for aspiration Aphasia Intermittent agitation Pneumonia Elevated troponin-likely demand ischemia  Recommendations/Plan:  Wife and all family members have discussed and confirmed decision AGAINST feeding tube.   DNR/DNI  Transition to comfort focused care with goal of comfort, quality, and dignity at EOL.    Comfort feeds per patient family request--ice and swabs will be safest.  Symptom management  Continue Ativan 0.5mg  HS scheduled  Morphine 1mg  IV q3h prn pain/dyspnea/air hunger  Tylenol as needed for pain  Ativan 0.5mg  IV q4h prn anxiety/agitation  Robinul 0.2mg  IV q6h prn secretions  Scopolamine  patch  Waiting on hospice facility bed. SW following.   Code Status: DNR/DNI    Code Status Orders  (From admission, onward)        Start     Ordered   10/12/17 1040  Do not attempt resuscitation (DNR)  Continuous    Question Answer Comment  In the event of cardiac or respiratory ARREST Do not call a "code blue"   In the event of cardiac or respiratory ARREST Do not perform Intubation, CPR, defibrillation or ACLS   In the event of cardiac or respiratory ARREST Use medication by any route, position, wound care, and other measures to relive pain and suffering. May use oxygen, suction and manual treatment of airway obstruction as needed for comfort.      10/12/17 1039    Code Status History    Date Active Date Inactive Code Status Order ID Comments User Context   10/10/2017 1235 10/12/2017 1039 Full Code [id number]  Karmen Bongo, MD ED   01/16/2014 1754 01/17/2014 1353 Full Code [id number]  Troy Sine, MD Inpatient   01/15/2014 1639 01/16/2014 1754 Full Code [id number]  Lorretta Harp, MD Inpatient   01/14/2014 1644 01/15/2014 1639 Full Code [id number]  Erlene Quan, PA-C Inpatient       Prognosis: Likely weeks or less with acute stroke, severe dysphagia, high risk of aspiration, and underlying Parkinson's disease. High risk for decompensation and high symptom burden with intermittent agitation, audible secretions, and dyspnea. Family opting for comfort focused care.   Discharge Planning:  Hospice facility   Thank you for allowing the Palliative Medicine Team to assist in the care of this patient.   Time In: 0915 Time Out: 0930 Total Time 65min Prolonged Time Billed no       Greater than 50%  of this time was spent counseling and coordinating care related to the above assessment and plan.  Ihor Dow, FNP-C Palliative Medicine Team  Phone: [phone] Fax: [phone]  Please contact Palliative Medicine Team phone at [phone] for questions and concerns.

## 2017-10-18 NOTE — Plan of Care (Signed)
Progressing towards disccharge, hospice

## 2017-10-18 NOTE — Progress Notes (Signed)
Per CSW no available beds at [facility]. CM spoke to patients son, Ronald Benitez, and after speaking to his mother and siblings they are agreeable to send a referral to [place]. CM called [place] and gave the referral to [facility]. They will f/u with patients wife and meet with her in the morning. CM following.

## 2017-10-18 NOTE — Progress Notes (Signed)
Spoke with MD about diet?  oral care done today, Diet response from MD was consult with palliative

## 2017-10-18 NOTE — Progress Notes (Signed)
PROGRESS NOTE    Ronald Benitez  JYN:[id number] DOB: Jun 13, 1928 DOA: 10/10/2017 PCP: [name], MD   Brief Narrative: 84 yo M with good prior functional status, history of DM, CAD s/p PCI, Parkinson's ds, HTN, HLD, CKD who was brought to the hospital on 5/6 after being found down in the bathroom by his wife around 7 am. He was noted to have right sided hemiplegia and neglect. Neurology was consulted in the ED. Cardiology was consulted as well for positive troponin and ST segment changes on EKG. No reports of chest discomfort prior to this episode.   He was found to have acute stroke, MRI  Showed acute/early subacute infarction involving the left lateral frontal lobe and mid insula, and  acute/early subacute infarction within the left occipitotemporal junction. He was admitted with right side hemiplegia and global aphasia. He didn't pass swallow evaluation and fail MBS. Recommendation was for tube feeding. Family met with palliative in multiples occasion to discussed goals of care. Family decide for not tube feeding and comfort care. Patient is awaiting residential hospice.    Assessment & Plan:   Principal Problem:   CVA (cerebral vascular accident) ([place]) Active Problems:   Essential hypertension   Hyperlipidemia   Chronic renal disease, stage III (HCC)   CAD (coronary artery disease)   DM (diabetes mellitus) ([place])   Elevated troponin   Acute ischemic stroke ([place])   Parkinson's disease ([place])   Dysphagia   Palliative care by specialist   Goals of care, counseling/discussion   Agitation   Increased oropharyngeal secretions   Terminal care   At high risk for aspiration  1-Acute CVA -MRI on admission showed acute/early subacute infarction involving the left lateral frontal lobe and mid insula. Additional small focus of acute/early subacute infarction within the left occipitotemporal junction. No associated hemorrhage or mass effect. -neurology following.  -ECHO ef lower  45 %, wall motion abnormalities.  -LDL 61 A1-c 8.1 -Aspirin per rectum.  -Fail swallow evaluation. Family unsure about NG tube,  Palliative met with  Family and discussed goals. Family has not made decision regarding tube feeding. They seem to be clear regarding not to pursue PEG tube -global aphasia, moves left upper extremities.  Wife and family has chosen for comfort care, hospice. Support provided.  On ativan PRN for agitation.  Appears comfortable today and resting.   2-PNA; Fever, chest x ray with patchy consolidation left upper lobe and possible right bases. Started IV antibiotics to cover for PNA.  Received 5 days of antibiotic.  3-Elevation troponin;  ECHO with wall motion abnormalities. Ef 45 % Cardiology following. Await rec.  Peak to 1.9. Thought to be related to CVA.  No further cardiac evaluation at this time due to recent stroke.   DM; SSI.   CKD stage IV; Cr at 2.3 stable. Continue to monitor.  No further labs check.   HTN;  Permissive HTN.   Parkinson's Diseases;  Medications on hold due to NPO status.   DVT prophylaxis: lovenox Code Status: DNR.  Family Communication: family has opted for not tube feeding,hospcie.  Disposition Plan: Awaiting residential hospice.   Consultants:   Neurology  Cardiology   Palliative.      Procedures:   ECHO;    Antimicrobials:  Unasyn 5-08   Subjective: Non verbal, appears comfortable.   Objective: Vitals:   10/16/17 2040 10/17/17 1234 10/17/17 2356 10/18/17 1401  BP: (!) 147/67 (!) 146/66 (!) 155/74 (!) 144/66  Pulse: 65 (!) 54 66 64  Resp: 16 19 16 16   Temp: 97.9 F (36.6 C) (!) 97.3 F (36.3 C) 98 F (36.7 C) 97.7 F (36.5 C)  TempSrc: Oral Oral Oral Oral  SpO2: 100% 99% 100% 94%  Weight:      Height:       No intake or output data in the 24 hours ending 10/18/17 1411 Filed Weights   10/10/17 0906  Weight: 76.8 kg (169 lb 5 oz)    Examination:  General exam: NAD Respiratory system;  CTA, tachypnea.  Cardiovascular system: S 1, S 2 RRR Gastrointestinal system; BS present, soft, nt Central nervous system; aphasic. Appears comfortable.  Extremities: no edema     Data Reviewed: I have personally reviewed following labs and imaging studies  CBC: Recent Labs  Lab 10/12/17 0534 10/13/17 0653  WBC 8.4 8.2  HGB 11.2* 11.1*  HCT 35.1* 35.0*  MCV 95.1 94.6  PLT 212 585   Basic Metabolic Panel: Recent Labs  Lab 10/12/17 0534 10/13/17 0653 10/15/17 0334  NA 143 144 145  K 4.1 4.0 4.0  CL 112* 113* 113*  CO2 21* 22 24  GLUCOSE 136* 121* 134*  BUN 41* 38* 36*  CREATININE 2.35* 2.30* 1.98*  CALCIUM 9.1 9.1 9.3   GFR: Estimated Creatinine Clearance: 28 mL/min (A) (by C-G formula based on SCr of 1.98 mg/dL (H)). Liver Function Tests: No results for input(s): AST, ALT, ALKPHOS, BILITOT, PROT, ALBUMIN in the last 168 hours. No results for input(s): LIPASE, AMYLASE in the last 168 hours. No results for input(s): AMMONIA in the last 168 hours. Coagulation Profile: No results for input(s): INR, PROTIME in the last 168 hours. Cardiac Enzymes: Recent Labs  Lab 10/11/17 1733  TROPONINI 1.79*   BNP (last 3 results) No results for input(s): PROBNP in the last 8760 hours. HbA1C: No results for input(s): HGBA1C in the last 72 hours. CBG: Recent Labs  Lab 10/16/17 0059 10/16/17 0416 10/16/17 0754 10/16/17 1132 10/16/17 2048  GLUCAP 111* 112* 114* 102* 116*   Lipid Profile: No results for input(s): CHOL, HDL, LDLCALC, TRIG, CHOLHDL, LDLDIRECT in the last 72 hours. Thyroid Function Tests: No results for input(s): TSH, T4TOTAL, FREET4, T3FREE, THYROIDAB in the last 72 hours. Anemia Panel: No results for input(s): VITAMINB12, FOLATE, FERRITIN, TIBC, IRON, RETICCTPCT in the last 72 hours. Sepsis Labs: No results for input(s): PROCALCITON, LATICACIDVEN in the last 168 hours.  No results found for this or any previous visit (from the past 240 hour(s)).        Radiology Studies: No results found.      Scheduled Meds: . aspirin  300 mg Rectal Daily   Or  . aspirin  325 mg Oral Daily  . chlorhexidine  15 mL Mouth Rinse BID  . LORazepam  0.5 mg Intravenous QHS  . mouth rinse  15 mL Mouth Rinse BID  . scopolamine  1 patch Transdermal Q72H   Continuous Infusions: . dextrose 1,000 mL (10/17/17 2141)     LOS: 8 days    Time spent: 35 minutes.     Elmarie Shiley, MD [facility] Pager [phone]  If 7PM-7AM, please contact night-coverage [email or web address] Password [facility] 10/18/2017, 2:11 PM

## 2017-10-19 NOTE — Care Management (Signed)
Received word that [facility] has a bed to offer Ronald Benitez today. CM informed pts son and he would like patient to d/c to [facility]. CM called [facility] and cancelled the meeting set for 10:30 per sons request. CSW updated.

## 2017-10-19 NOTE — Progress Notes (Signed)
Pt restless dypsneic  and agitated morphine 1 mg given, reposition and made comfortable. Will continue to monitor.

## 2017-10-19 NOTE — Consult Note (Signed)
[facility] [place]:  Spoke to the pt's wife and son over the phone. He states that he spoke to [facility] with [place] and they do not have a bed available at this time. He has agreed to meet with [place] at 1030am to discuss Hospice care at [facility]. Webb Silversmith RN

## 2017-10-19 NOTE — Progress Notes (Signed)
Discharge to: [facility] [place] Anticipated discharge date: 10/19/17 Family notified: Yes, at bedside Transportation by: [facility]  CSW signing off.  Laveda Abbe LCSW [phone]

## 2017-10-19 NOTE — Progress Notes (Signed)
[facility] Liaison RN visit.  Received request from Kathlee Nations, [place] for family interest in [facility] with request for transfer today. Chart reviewed. Met with wife, [name] and son, [name] to confirm interest and explain services. Family agreeable to transfer today. CSW aware. Registration paperwork completed. Dr. Orpah Melter to assume care per family request. Please fax discharge summary to [phone]. RN please call report to [phone]. Please arrange transport for patient to arrive as soon as possible.   Please call with an hospice related questions.   Thank you,  Farrel Gordon, RN, [place] are on AMION

## 2017-10-19 NOTE — Plan of Care (Signed)
Adequate for discharge.

## 2017-10-19 NOTE — Care Management Note (Signed)
Case Management Note  Patient Details  Name: Ronald Benitez MRN: [id number] Date of Birth: 1928/11/01  Subjective/Objective:                    Action/Plan: Pt discharging to [facility] today. [facility] called and transportation arranged after speaking to family and bedside RN.  Packet in the chart. D/C summary sent to [facility].   Expected Discharge Date:  10/19/17               Expected Discharge Plan:  [place]  In-House Referral:  Clinical Social Work, Hospice / Palliative Care  Discharge planning Services  CM Consult  Post Acute Care Choice:    Choice offered to:     DME Arranged:    DME Agency:     HH Arranged:    [place] Agency:     Status of Service:  Completed, signed off  If discussed at [employer], dates discussed:    Additional Comments:  Pollie Friar, RN 10/19/2017, 12:29 PM

## 2017-10-19 NOTE — Discharge Instructions (Signed)
Hospice Introduction Hospice is a service that is designed to provide people who are terminally ill and their families with medical, spiritual, and psychological support. Its aim is to improve your quality of life by keeping you as alert and comfortable as possible. Who will be my providers when I begin hospice care? Hospice teams often include:  A nurse.  A doctor. The hospice doctor will be available for your care, but you can bring your regular doctor or nurse practitioner.  Social workers.  Religious leaders (such as a Clinical biochemist).  Trained volunteers.  What roles will providers play in my care? Hospice is performed by a team of health care professionals and volunteers who:  Help keep you comfortable: ? Hospice can be provided in your home or in a homelike setting. ? The hospice staff works with your family and friends to help meet your needs. ? You will enjoy the support of loved ones by receiving much of your basic care from family and friends.  Provide pain relief and manage your symptoms. The staff supply all necessary medicines and equipment.  Provide companionship when you are alone.  Allow you and your family to rest. They may do light housekeeping, prepare meals, and run errands.  Provide counseling. They will make sure your emotional, spiritual, and social needs and those of your family are being met.  Provide spiritual care: ? Spiritual care will be individualized to meet your needs and your family's needs. ? Spiritual care may involve:  Helping you look at what death means to you.  Helping you say goodbye to your family and friends.  Performing a specific religious ceremony or ritual.  When should hospice care begin? Most people who use hospice are believed to have fewer than 6 months to live.  Your family and health care providers can help you decide when hospice services should begin.  If your condition improves, you may discontinue the program.  What  should I consider before selecting a program? Most hospice programs are run by nonprofit, independent organizations. Some are affiliated with hospitals, nursing homes, or home health care agencies. Hospice programs can take place in the home or at a hospice center, hospital, or skilled nursing facility. When choosing a hospice program, ask the following questions:  What services are available to me?  What services will be offered to my loved ones?  How involved will my loved ones be?  How involved will my health care provider be?  Who makes up the hospice care team? How are they trained or screened?  How will my pain and symptoms be managed?  If my circumstances change, can the services be provided in a different setting, such as my home or in the hospital?  Is the program reviewed and licensed by the state or certified in some other way?  Where can I learn more about hospice? You can learn about existing hospice programs in your area from your health care providers. You can also read more about hospice online. The websites of the following organizations contain helpful information:  The [facility] and Palliative Care Organization [facility]).  The Hospice Association of America ([place]).  The [place].  The American Cancer Society (ACS).  Hospice Net.  This information is not intended to replace advice given to you by your health care provider. Make sure you discuss any questions you have with your health care provider. Document Released: 09/10/2003 Document Revised: 01/08/2016 Document Reviewed: 04/03/2013 Elsevier Interactive Patient Education  2017 [employer].

## 2017-10-19 NOTE — Discharge Summary (Signed)
Physician Discharge Summary  Ronald Benitez KDX:[id number] DOB: 01/20/1929 DOA: 10/10/2017  PCP: [name], MD  Admit date: 10/10/2017 Discharge date: 10/19/2017  Admitted From: Home Disposition: Hospice  Recommendations for Outpatient Follow-up:  1. None  Home Health: Hospice Equipment/Devices: None  Discharge Condition: Hospice CODE STATUS: DNR/Hospice Diet recommendation: Comfort feeds   Brief/Interim Summary:  Admission HPI written by Karmen Bongo, MD   Chief Complaint: fall  HPI: Ronald Benitez is a 84 y.o. male with medical history significant of DM; CAD s/p PCI; Parkinson's disease; HTN; HLD; depression; COPD; and stage 3 CKD presenting with significant neurologic deficits thought to be related to a stroke.   This morning, his wife found that he had fallen in the bathroom.  She is unsure when it happened - she found him about 715.  She left him about 1AM and he was normal in his recliner.  His walker was outside the bathroom when she found him.  No urinary or fecal incontinence.  He has a scalp bruise.  He was his normal self yesterday without difficulty.  Ambulates with his walker most of the time in the house.  No aspiration concerns normally although he does have a lot of phlegm.     ED Course:  Code stroke - awake, aphasic, trying to communicate but can't; right neglect, right hemiplegia.  Neurology has consulted, Dr. Cheral Marker.  No head bleed, not anticoagulated.  Troponin is increased to 0.12, no baseline.  EKG with new ST changes, no STEMI.   He has a page out to cardiology.    Hospital course:  MRI LDL 61 performed significant for acute/early subacute infarction involving left lateral frontal lobe and mid insula.  Patient with significant dysarthria and dysphasia as a result of his CVA.  Speech therapy evaluated and recommended artificial means for nutrition.  Per discussions with family for goals of care, plan for residential hospice rather  than artificial feeding with PEG placement. LDL of 61 and hemoglobin A1c of 8.1%.  Echocardiogram significant for an EF of 45%. Patient also presented with fever and chest x-ray suggesting left upper lobe pneumonia; he was treated with IV antibiotics 5 days.   Discharge Diagnoses:  Principal Problem:   CVA (cerebral vascular accident) [facility]) Active Problems:   Essential hypertension   Hyperlipidemia   Chronic renal disease, stage III (HCC)   CAD (coronary artery disease)   DM (diabetes mellitus) ([place])   Elevated troponin   Acute ischemic stroke ([place])   Parkinson's disease ([place])   Dysphagia   Palliative care by specialist   Goals of care, counseling/discussion   Agitation   Increased oropharyngeal secretions   Terminal care   At high risk for aspiration    Discharge Instructions  Discharge Instructions    Diet - low sodium heart healthy   Complete by:  As directed    Increase activity slowly   Complete by:  As directed      Allergies as of 10/19/2017   No Known Allergies     Medication List    STOP taking these medications   [facility] EX   aspirin 81 MG EC tablet   captopril 12.5 MG tablet Commonly known as:  CAPOTEN   carbidopa-levodopa 25-100 MG tablet Commonly known as:  SINEMET IR   clopidogrel 75 MG tablet Commonly known as:  PLAVIX   CoQ-10 200 MG Caps   finasteride 5 MG tablet Commonly known as:  PROSCAR   Fish Oil 1000 MG Caps  glimepiride 2 MG tablet Commonly known as:  AMARYL   ICAPS Caps   metoprolol tartrate 25 MG tablet Commonly known as:  LOPRESSOR   MOVE FREE JOINT HEALTH ADVANCE Tabs   nitroGLYCERIN 0.4 MG SL tablet Commonly known as:  NITROSTAT   polyethylene glycol packet Commonly known as:  MIRALAX / GLYCOLAX   simvastatin 80 MG tablet Commonly known as:  ZOCOR   STOOL SOFTENER & LAXATIVE PO   tamsulosin 0.4 MG Caps capsule Commonly known as:  FLOMAX   traMADol 50 MG tablet Commonly known as:  ULTRAM     VITAMIN B COMPLEX PO   Vitamin B-12 5000 MCG Tbdp   vitamin C 500 MG tablet Commonly known as:  ASCORBIC ACID   Vitamin D3 2000 units capsule   vitamin E 400 UNIT capsule       No Known Allergies  Consultations:  Neurology/stroke   Procedures/Studies: Ct Angio Head W Or Wo Contrast  Result Date: 10/10/2017 CLINICAL DATA:  84 year old male with aphasia.  Code stroke. EXAM: CT ANGIOGRAPHY HEAD AND NECK CT PERFUSION BRAIN TECHNIQUE: Multidetector CT imaging of the head and neck was performed using the standard protocol during bolus administration of intravenous contrast. Multiplanar CT image reconstructions and MIPs were obtained to evaluate the vascular anatomy. Carotid stenosis measurements (when applicable) are obtained utilizing NASCET criteria, using the distal internal carotid diameter as the denominator. Multiphase CT imaging of the brain was performed following IV bolus contrast injection. Subsequent parametric perfusion maps were calculated using RAPID software. CONTRAST:  155mL ISOVUE-370 IOPAMIDOL (ISOVUE-370) INJECTION 76% COMPARISON:  Noncontrast head CT 0829 hours today. Cervical spine MRI 11/24/2011. FINDINGS: CT Brain Perfusion Findings: ASPECTS 10 at 0829 hours today. CBF (<30%) Volume: 19 milliliters Perfusion (Tmax>6.0s) volume: 33 milliliters Mismatch Volume: 14 milliliters Infarction Location:Middle left MCA territory/operculum CTA NECK Skeleton: No acute osseous abnormality identified. Generalized cervical spine degeneration. Upper chest: Small volume retained secretions in the visible trachea. Respiratory motion artifact in the lung apices but evidence of emphysema in the right lung apex. Incidental azygos fissure (normal variant). Other neck: Small volume retained secretions in the hypopharynx. No neck mass or lymphadenopathy. Aortic arch: Aberrancy origin of the right subclavian artery, normal variant. Moderate Calcified aortic atherosclerosis. Right carotid system: No  proximal right CCA stenosis despite soft plaque. Calcified plaque at the right carotid bifurcation and right ICA bulb with no significant stenosis. Moderately tortuous mid cervical right ICA with calcified plaque and a kinked appearance, but otherwise no stenosis to the skull base. Left carotid system: Left CCA origin atherosclerosis without stenosis. Fairly bulky left carotid bifurcation calcified plaque tracking into the left ICA origin and bulb. Stenosis at the distal bulb up to 60 % with respect to the distal vessel (series 11, image 135). Tortuosity and mild ectasia of the left ICA distal to the bulb but no additional stenosis to the skull base. Vertebral arteries: Aberrancy origin of the right subclavian artery with calcified plaque. No significant proximal subclavian stenosis. Calcified plaque at the right vertebral artery origin appears moderate to severe with patent but asymmetrically decreased enhancement of the right vertebral artery when compared to the left throughout the neck and to the skull base. No additional cervical right vertebral artery stenosis. No significant proximal left subclavian artery stenosis despite calcified plaque. At least moderate stenosis of the left vertebral artery origin due to calcified plaque. Left vertebral artery appears dominant and demonstrates superior enhancement versus the right. No additional cervical left vertebral stenosis. CTA HEAD Posterior circulation:  Dominant distal left vertebral artery with patent left PICA origin. Asymmetrically decreased enhancement of the right vertebral artery V4 segment, although the right PICA origin remains patent. The distal right V4 appears occluded proximal to the vertebrobasilar junction (series 6, image 66). Patent basilar artery without stenosis. Patent SCA origins. Normal left PCA origin. Fetal type right PCA origin. Left posterior communicating artery is diminutive. The bilateral PCA branches appear patent with mild  irregularity. Anterior circulation: Both ICA siphons are patent. There is moderate bilateral calcified plaque with tortuous cavernous segments. There is mild cavernous left ICA stenosis. There is mild to moderate stenosis of the right ICA siphon proximal cavernous and proximal supraclinoid segments. Normal ophthalmic and posterior communicating artery origins. Patent carotid termini. Patent MCA and left ACA origins. The left ACA is dominant and the right is diminutive or absent. The anterior communicating artery is ectatic. The bilateral ACA branches are patent with mild irregularity. The right MCA M1 segment, bifurcation, and right MCA branches are patent with mild irregularity. The left MCA M1 segment is patent. At the left MCA trifurcation and anterior division M2 branch is occluded as seen on series 11, image 137. Of the patent M2 branches, there are M3 branch occlusions demonstrated on series 14, image 29. Venous sinuses: Insufficient venous contrast. Anatomic variants: Dominant left vertebral artery. Dominant left ACA A1 segment. Review of the MIP images confirms the above findings IMPRESSION: 1. CTP estimates a middle Left MCA territory core infarct of 19 mL but only modest additional left MCA territory penumbra resulting in a relatively small mismatch estimated at 14 mL. 2. 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 Patent Left ICA and Left M1. 3. The above was discussed by telephone with Dr. Kerney Elbe on 10/10/2017 at 0906 hours. 4. Superimposed Left ICA calcified atherosclerosis with estimated 60% stenosis distal Left ICA bulb. Up to moderate contralateral right ICA siphon stenosis due to calcified plaque. 5. Dominant Left vertebral artery with mild to moderate origin stenosis. Patent basilar artery without stenosis. 6. Decreased enhancement of the Right Vertebral Artery relative to the left appears related to moderate to severe origin stenosis and occlusion of the distal right V4 segment. The right  PICA origin remains patent. 7. Aortic Atherosclerosis (ICD10-I70.0) and Emphysema (ICD10-J43.9). Electronically Signed   By: Genevie Ann M.D.   On: 10/10/2017 09:30   Ct Angio Neck W Or Wo Contrast  Result Date: 10/10/2017 CLINICAL DATA:  84 year old male with aphasia.  Code stroke. EXAM: CT ANGIOGRAPHY HEAD AND NECK CT PERFUSION BRAIN TECHNIQUE: Multidetector CT imaging of the head and neck was performed using the standard protocol during bolus administration of intravenous contrast. Multiplanar CT image reconstructions and MIPs were obtained to evaluate the vascular anatomy. Carotid stenosis measurements (when applicable) are obtained utilizing NASCET criteria, using the distal internal carotid diameter as the denominator. Multiphase CT imaging of the brain was performed following IV bolus contrast injection. Subsequent parametric perfusion maps were calculated using RAPID software. CONTRAST:  149mL ISOVUE-370 IOPAMIDOL (ISOVUE-370) INJECTION 76% COMPARISON:  Noncontrast head CT 0829 hours today. Cervical spine MRI 11/24/2011. FINDINGS: CT Brain Perfusion Findings: ASPECTS 10 at 0829 hours today. CBF (<30%) Volume: 19 milliliters Perfusion (Tmax>6.0s) volume: 33 milliliters Mismatch Volume: 14 milliliters Infarction Location:Middle left MCA territory/operculum CTA NECK Skeleton: No acute osseous abnormality identified. Generalized cervical spine degeneration. Upper chest: Small volume retained secretions in the visible trachea. Respiratory motion artifact in the lung apices but evidence of emphysema in the right lung apex. Incidental azygos fissure (  normal variant). Other neck: Small volume retained secretions in the hypopharynx. No neck mass or lymphadenopathy. Aortic arch: Aberrancy origin of the right subclavian artery, normal variant. Moderate Calcified aortic atherosclerosis. Right carotid system: No proximal right CCA stenosis despite soft plaque. Calcified plaque at the right carotid bifurcation and right  ICA bulb with no significant stenosis. Moderately tortuous mid cervical right ICA with calcified plaque and a kinked appearance, but otherwise no stenosis to the skull base. Left carotid system: Left CCA origin atherosclerosis without stenosis. Fairly bulky left carotid bifurcation calcified plaque tracking into the left ICA origin and bulb. Stenosis at the distal bulb up to 60 % with respect to the distal vessel (series 11, image 135). Tortuosity and mild ectasia of the left ICA distal to the bulb but no additional stenosis to the skull base. Vertebral arteries: Aberrancy origin of the right subclavian artery with calcified plaque. No significant proximal subclavian stenosis. Calcified plaque at the right vertebral artery origin appears moderate to severe with patent but asymmetrically decreased enhancement of the right vertebral artery when compared to the left throughout the neck and to the skull base. No additional cervical right vertebral artery stenosis. No significant proximal left subclavian artery stenosis despite calcified plaque. At least moderate stenosis of the left vertebral artery origin due to calcified plaque. Left vertebral artery appears dominant and demonstrates superior enhancement versus the right. No additional cervical left vertebral stenosis. CTA HEAD Posterior circulation: Dominant distal left vertebral artery with patent left PICA origin. Asymmetrically decreased enhancement of the right vertebral artery V4 segment, although the right PICA origin remains patent. The distal right V4 appears occluded proximal to the vertebrobasilar junction (series 6, image 66). Patent basilar artery without stenosis. Patent SCA origins. Normal left PCA origin. Fetal type right PCA origin. Left posterior communicating artery is diminutive. The bilateral PCA branches appear patent with mild irregularity. Anterior circulation: Both ICA siphons are patent. There is moderate bilateral calcified plaque with  tortuous cavernous segments. There is mild cavernous left ICA stenosis. There is mild to moderate stenosis of the right ICA siphon proximal cavernous and proximal supraclinoid segments. Normal ophthalmic and posterior communicating artery origins. Patent carotid termini. Patent MCA and left ACA origins. The left ACA is dominant and the right is diminutive or absent. The anterior communicating artery is ectatic. The bilateral ACA branches are patent with mild irregularity. The right MCA M1 segment, bifurcation, and right MCA branches are patent with mild irregularity. The left MCA M1 segment is patent. At the left MCA trifurcation and anterior division M2 branch is occluded as seen on series 11, image 137. Of the patent M2 branches, there are M3 branch occlusions demonstrated on series 14, image 29. Venous sinuses: Insufficient venous contrast. Anatomic variants: Dominant left vertebral artery. Dominant left ACA A1 segment. Review of the MIP images confirms the above findings IMPRESSION: 1. CTP estimates a middle Left MCA territory core infarct of 19 mL but only modest additional left MCA territory penumbra resulting in a relatively small mismatch estimated at 14 mL. 2. 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 Patent Left ICA and Left M1. 3. The above was discussed by telephone with Dr. Kerney Elbe on 10/10/2017 at 0906 hours. 4. Superimposed Left ICA calcified atherosclerosis with estimated 60% stenosis distal Left ICA bulb. Up to moderate contralateral right ICA siphon stenosis due to calcified plaque. 5. Dominant Left vertebral artery with mild to moderate origin stenosis. Patent basilar artery without stenosis. 6. Decreased enhancement of the Right  Vertebral Artery relative to the left appears related to moderate to severe origin stenosis and occlusion of the distal right V4 segment. The right PICA origin remains patent. 7. Aortic Atherosclerosis (ICD10-I70.0) and Emphysema (ICD10-J43.9). Electronically  Signed   By: Genevie Ann M.D.   On: 10/10/2017 09:30   Mr Brain Wo Contrast  Result Date: 10/11/2017 CLINICAL DATA:  84 y/o  M; stroke for follow-up. EXAM: MRI HEAD WITHOUT CONTRAST TECHNIQUE: Multiplanar, multiecho pulse sequences of the brain and surrounding structures were obtained without intravenous contrast. COMPARISON:  10/11/2017 MRI head.  10/10/2017 CT head and CTA head. FINDINGS: Brain: Area of reduced diffusion within the left lateral frontal lobe extending into the mid insula compatible with acute/early subacute infarction. Additional small focus of acute/early subacute infarction within the left occipital temporal junction. No associated hemorrhage or mass effect. Small chronic infarcts are present within bilateral cerebellar hemispheres, left thalamus, and left anterior putamen. Moderate chronic microvascular ischemic changes and parenchymal volume loss of the brain for age. Punctate foci of susceptibility hypointensity are present in right cerebellar hemisphere and left frontal periventricular white matter compatible with hemosiderin deposition of chronic microhemorrhage. No hydrocephalus, extra-axial collection, herniation, or effacement of basilar cisterns. Vascular: Normal flow voids. Skull and upper cervical spine: Normal marrow signal. Sinuses/Orbits: Negative.  Bilateral intra-ocular lens replacement. Other: None. IMPRESSION: Acute/early subacute infarction involving the left lateral frontal lobe and mid insula. Additional small focus of acute/early subacute infarction within the left occipitotemporal junction. No associated hemorrhage or mass effect. Electronically Signed   By: Kristine Garbe M.D.   On: 10/11/2017 03:31   Ct Cerebral Perfusion W Contrast  Result Date: 10/10/2017 CLINICAL DATA:  84 year old male with aphasia.  Code stroke. EXAM: CT ANGIOGRAPHY HEAD AND NECK CT PERFUSION BRAIN TECHNIQUE: Multidetector CT imaging of the head and neck was performed using the standard  protocol during bolus administration of intravenous contrast. Multiplanar CT image reconstructions and MIPs were obtained to evaluate the vascular anatomy. Carotid stenosis measurements (when applicable) are obtained utilizing NASCET criteria, using the distal internal carotid diameter as the denominator. Multiphase CT imaging of the brain was performed following IV bolus contrast injection. Subsequent parametric perfusion maps were calculated using RAPID software. CONTRAST:  127mL ISOVUE-370 IOPAMIDOL (ISOVUE-370) INJECTION 76% COMPARISON:  Noncontrast head CT 0829 hours today. Cervical spine MRI 11/24/2011. FINDINGS: CT Brain Perfusion Findings: ASPECTS 10 at 0829 hours today. CBF (<30%) Volume: 19 milliliters Perfusion (Tmax>6.0s) volume: 33 milliliters Mismatch Volume: 14 milliliters Infarction Location:Middle left MCA territory/operculum CTA NECK Skeleton: No acute osseous abnormality identified. Generalized cervical spine degeneration. Upper chest: Small volume retained secretions in the visible trachea. Respiratory motion artifact in the lung apices but evidence of emphysema in the right lung apex. Incidental azygos fissure (normal variant). Other neck: Small volume retained secretions in the hypopharynx. No neck mass or lymphadenopathy. Aortic arch: Aberrancy origin of the right subclavian artery, normal variant. Moderate Calcified aortic atherosclerosis. Right carotid system: No proximal right CCA stenosis despite soft plaque. Calcified plaque at the right carotid bifurcation and right ICA bulb with no significant stenosis. Moderately tortuous mid cervical right ICA with calcified plaque and a kinked appearance, but otherwise no stenosis to the skull base. Left carotid system: Left CCA origin atherosclerosis without stenosis. Fairly bulky left carotid bifurcation calcified plaque tracking into the left ICA origin and bulb. Stenosis at the distal bulb up to 60 % with respect to the distal vessel (series 11,  image 135). Tortuosity and mild ectasia of the left ICA  distal to the bulb but no additional stenosis to the skull base. Vertebral arteries: Aberrancy origin of the right subclavian artery with calcified plaque. No significant proximal subclavian stenosis. Calcified plaque at the right vertebral artery origin appears moderate to severe with patent but asymmetrically decreased enhancement of the right vertebral artery when compared to the left throughout the neck and to the skull base. No additional cervical right vertebral artery stenosis. No significant proximal left subclavian artery stenosis despite calcified plaque. At least moderate stenosis of the left vertebral artery origin due to calcified plaque. Left vertebral artery appears dominant and demonstrates superior enhancement versus the right. No additional cervical left vertebral stenosis. CTA HEAD Posterior circulation: Dominant distal left vertebral artery with patent left PICA origin. Asymmetrically decreased enhancement of the right vertebral artery V4 segment, although the right PICA origin remains patent. The distal right V4 appears occluded proximal to the vertebrobasilar junction (series 6, image 66). Patent basilar artery without stenosis. Patent SCA origins. Normal left PCA origin. Fetal type right PCA origin. Left posterior communicating artery is diminutive. The bilateral PCA branches appear patent with mild irregularity. Anterior circulation: Both ICA siphons are patent. There is moderate bilateral calcified plaque with tortuous cavernous segments. There is mild cavernous left ICA stenosis. There is mild to moderate stenosis of the right ICA siphon proximal cavernous and proximal supraclinoid segments. Normal ophthalmic and posterior communicating artery origins. Patent carotid termini. Patent MCA and left ACA origins. The left ACA is dominant and the right is diminutive or absent. The anterior communicating artery is ectatic. The bilateral ACA  branches are patent with mild irregularity. The right MCA M1 segment, bifurcation, and right MCA branches are patent with mild irregularity. The left MCA M1 segment is patent. At the left MCA trifurcation and anterior division M2 branch is occluded as seen on series 11, image 137. Of the patent M2 branches, there are M3 branch occlusions demonstrated on series 14, image 29. Venous sinuses: Insufficient venous contrast. Anatomic variants: Dominant left vertebral artery. Dominant left ACA A1 segment. Review of the MIP images confirms the above findings IMPRESSION: 1. CTP estimates a middle Left MCA territory core infarct of 19 mL but only modest additional left MCA territory penumbra resulting in a relatively small mismatch estimated at 14 mL. 2. 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 Patent Left ICA and Left M1. 3. The above was discussed by telephone with Dr. Kerney Elbe on 10/10/2017 at 0906 hours. 4. Superimposed Left ICA calcified atherosclerosis with estimated 60% stenosis distal Left ICA bulb. Up to moderate contralateral right ICA siphon stenosis due to calcified plaque. 5. Dominant Left vertebral artery with mild to moderate origin stenosis. Patent basilar artery without stenosis. 6. Decreased enhancement of the Right Vertebral Artery relative to the left appears related to moderate to severe origin stenosis and occlusion of the distal right V4 segment. The right PICA origin remains patent. 7. Aortic Atherosclerosis (ICD10-I70.0) and Emphysema (ICD10-J43.9). Electronically Signed   By: Genevie Ann M.D.   On: 10/10/2017 09:30   Dg Chest Port 1 View  Result Date: 10/11/2017 CLINICAL DATA:  84 year old male with fever.  Initial encounter. EXAM: PORTABLE CHEST 1 VIEW COMPARISON:  07/23/2015 chest x-ray. FINDINGS: Pulmonary vascular congestion/pulmonary edema. Superimposed patchy consolidation left upper lobe and possibly right lung base may represent infectious process given patient's history of fever.  Heart size top-normal. Calcified aorta. IMPRESSION: Pulmonary vascular congestion/pulmonary edema. Superimposed patchy consolidation left upper lobe and possibly right lung base may represent infectious  process given patient's history of fever. Follow-up until clearance recommended to exclude underlying mass. Aortic Atherosclerosis (ICD10-I70.0). Electronically Signed   By: Genia Del M.D.   On: 10/11/2017 11:15   Dg Swallowing Func-speech Pathology  Result Date: 10/12/2017 Objective Swallowing Evaluation: Type of Study: MBS-Modified Barium Swallow Study  Patient Details Name: Ronald Benitez MRN: [id number] Date of Birth: 11-29-1928 Today's Date: 10/12/2017 Time: SLP Start Time (ACUTE ONLY): 1105 -SLP Stop Time (ACUTE ONLY): 1130 SLP Time Calculation (min) (ACUTE ONLY): 25 min Past Medical History: Past Medical History: Diagnosis Date . Arthritis  . BPH (benign prostatic hyperplasia)  . Bruises easily  . CAD (coronary artery disease) cardiologist-  dr berry  a. 01/16/14 s/p DES x2 to RCA. b remote RCA stenting 03/ 1997; 04/ 1997 stenting OM2;  11/ 1997  stenting pCFX, OM2 (restenosed), angioplasty OM1  . CKD (chronic kidney disease), stage III ([place])  . COPD mixed type [facility]) pulmologist-  dr byrum  mild obstructive and restrictive . Depression  . Diverticulosis of colon  . Dyspnea  . First degree heart block  . GERD (gastroesophageal reflux disease)  . Hearing loss of both ears  . Hiatal hernia  . History of adenomatous polyp of colon   last colonoscopy 12-06-2016 tubular adenoma w/ high grade hyperplasia (cecal polyp),  tubular adenoma not high grade dysplasia (bx),  tubulovillious adenoma's not high grade dysplasia (sigmoid polyp x2) . History of coronary artery stent placement   03/ 1997;  04/ 1997;  11/ 1997  (unknown type); DES 01/15/17 . History of non-ST elevation myocardial infarction (NSTEMI) 03/01/2000 . HTN (hypertension)  . Hyperlipidemia  . Idiopathic Parkinson's disease [facility])   neurologist-  dr tat  . Right bundle branch block (RBBB) with left anterior fascicular block (LAFB)  . Sebaceous cyst   chronic of buttock-- per pt open area size tennis ball . Speech impediment  . Type 2 diabetes mellitus ([place])  . Unsteady gait  . Wears glasses  Past Surgical History: Past Surgical History: Procedure Laterality Date . CARDIAC CATHETERIZATION  02-10-2000  dr Daneen Schick  high-grade functional total occludion of PDA, high-grade obstruction in branch of the acute margainal branch of RCA,  total occlusion of branch of the D1 with high-grade obstruction in the second branch of D1; mLAD 50%,  third obtuse marginal branch 70-80%; all of these sites of stenosis in the branch vessels are not signifinantly changed from cardiac cath. after PCI 1997,except acute marginal br  . CARDIAC CATHETERIZATION  05-10-2005  dr Tamala Julian  severe CAD w/ total occlusion D1, high-grade obstruction in acute marginal of RCA and PDA, high-grade ostruction in more superior branch of OM1 w/ wide patency of second larger branch of OM1;  continuation severe diffuse disease CFX , has very limited distribution, LAD main portion patent as is prox., mid, & distal RCA; ef 50% w/ mild anterolateral hypokinesis  . CARDIOVASCULAR STRESS TEST  12-12-2013  dr berry  Intermediate nuclear study w/ significant reversible lateral , apical lateral defect which may represent diagonal vessel ischemia/ normal LV wall motion,  lateral hypokinesis w/ ef 48% . CATARACT EXTRACTION W/ INTRAOCULAR LENS IMPLANT Bilateral 2009 approx. . COLONOSCOPY WITH PROPOFOL N/A 12/06/2016  Procedure: COLONOSCOPY WITH PROPOFOL;  Surgeon: Wilford Corner, MD;  Location: [place];  Service: Endoscopy;  Laterality: N/A; . CORONARY ANGIOPLASTY WITH STENT PLACEMENT  08/1995  dr Daneen Schick  stenting to RCA . CORONARY ANGIOPLASTY WITH STENT PLACEMENT  04/ 1997  dr Daneen Schick  stenting to OM2 .  CORONARY ANGIOPLASTY WITH STENT PLACEMENT  11/ 1997  dr Daneen Schick  stenting to proxCFX, OM2  (restenosed), PTCA to OM1 . ESOPHAGOGASTRODUODENOSCOPY ENDOSCOPY   . LEFT HEART CATHETERIZATION WITH CORONARY ANGIOGRAM N/A 01/15/2014  Procedure: LEFT HEART CATHETERIZATION WITH CORONARY ANGIOGRAM;  Surgeon: Lorretta Harp, MD;  Location: [facility] CATH LAB;  Service: Cardiovascular;  Laterality: N/A; occluded moderate size diagonal branch which fills by collaterals faintly;  patent mid AV groove CFX stent, patent mRCA stent w/ high-grade tandem stenosis's in the mid and distal portion . MASS EXCISION Left 12/15/2016  Procedure: REMOVAL OF LEFT GLUTEAL SUBCUTANEOUS MASS;  Surgeon: Michael Boston, MD;  Location: [facility];  Service: General;  Laterality: Left; . OPEN INGUINAL HERNIA REPAIR W/ MESH Right 01/ 24/ 2012   dr Dalbert Batman . PERCUTANEOUS CORONARY ROTOBLATOR INTERVENTION (PCI-R) N/A 01/16/2014  Procedure: PERCUTANEOUS CORONARY ROTOBLATOR INTERVENTION (PCI-R);  Surgeon: Troy Sine, MD;  Location: [facility] CATH LAB;  Service: Cardiovascular;  Laterality: N/A;  PTCI to RCA and DES x2 to mid and distal RCA . TONSILLECTOMY  child . TRANSTHORACIC ECHOCARDIOGRAM  12-06-2013   dr berry  mild focal basal hypertrophy of the septum,  ef 50-55%,  grade 3 diastolic dysfunction/  mild AR/ mild to moderate MR/  increased thickness of the atrial septum consistant with lipomatous hypertrophy and very small PFO (agitated saline contrast study showed small right-to-left atrial level shunt/  trivial PR/  mild RAE  HPI: Ronald Benitez a 84 y.o.malewith medical history significant ofDM; CAD s/p PCI; Parkinson's disease; HTN; HLD; depression; COPD; and stage 3 CKD presenting with significant neurologic deficits thought to be related to a stroke.This morning, his wife found that he had fallen in the bathroom. She is unsure when it happened - she found him about 715. She left him about 1AM and he was normal in his recliner. His walker was outside the bathroom when she found him. No urinary or fecal incontinence. He has a scalp  bruise. He was his normal self yesterday without difficulty. Ambulates with his walker most of the time in the house. No aspiration concerns normally although he does have a lot of phlegm. Code stroke - awake, aphasic, trying to communicate but can't; right neglect, right hemiplegia. Acute/early subacute infarction involving the left lateral frontal lobe and mid insula. Additional small focus of acute/early subacute infarction within the left occipitotemporal junction. No associated hemorrhage or mass effect.  Subjective: alert, nonverbal Assessment / Plan / Recommendation CHL IP CLINICAL IMPRESSIONS 10/12/2017 Clinical Impression Pt presents with mod-severe oropharyngeal dysphagia marked by poor oral attention, reduced labial seal with anterior spillage; oral holding of POs with limited effort to propel to pharynx.  There was passive spilling of POs into pharynx, with reduced hyolaryngeal mobility/poor epiglottic closure, leading to immediate aspiration of thin liquids.  Cough response did not eject penetrate/aspirate. Nectar liquids penetrated the larynx; purees rested in pharynx and oral cavity.  Study was discontinued at this time and oral residue was removed via suctioning.  Pt is a high aspiration risk at this time.  Video and nature of dysphagia was viewed and discussed with pt's wife and daughter.  Results D/W Dr. Tyrell Antonio.  Palliative medicine consult is potentially pending.  Recommend NPO for now.  SLP Visit Diagnosis Dysphagia, oropharyngeal phase (R13.12) Attention and concentration deficit following -- Frontal lobe and executive function deficit following -- Impact on safety and function Risk for inadequate nutrition/hydration;Severe aspiration risk   CHL IP TREATMENT RECOMMENDATION 10/12/2017 Treatment Recommendations Therapy as outlined in  treatment plan below   Prognosis 10/11/2017 Prognosis for Safe Diet Advancement Fair Barriers to Reach Goals Cognitive deficits;Severity of deficits  Barriers/Prognosis Comment -- CHL IP DIET RECOMMENDATION 10/12/2017 SLP Diet Recommendations NPO Liquid Administration via -- Medication Administration -- Compensations -- Postural Changes --   No flowsheet data found.  CHL IP FOLLOW UP RECOMMENDATIONS 10/12/2017 Follow up Recommendations Inpatient Rehab   CHL IP FREQUENCY AND DURATION 10/11/2017 Speech Therapy Frequency (ACUTE ONLY) min 2x/week Treatment Duration --      CHL IP ORAL PHASE 10/12/2017 Oral Phase Impaired Oral - Pudding Teaspoon -- Oral - Pudding Cup -- Oral - Honey Teaspoon -- Oral - Honey Cup -- Oral - Nectar Teaspoon Left anterior bolus loss;Right anterior bolus loss;Weak lingual manipulation;Incomplete tongue to palate contact;Holding of bolus;Reduced posterior propulsion;Delayed oral transit;Decreased bolus cohesion Oral - Nectar Cup -- Oral - Nectar Straw -- Oral - Thin Teaspoon Left anterior bolus loss;Right anterior bolus loss;Weak lingual manipulation;Incomplete tongue to palate contact;Holding of bolus;Reduced posterior propulsion;Delayed oral transit;Decreased bolus cohesion Oral - Thin Cup -- Oral - Thin Straw -- Oral - Puree Weak lingual manipulation;Incomplete tongue to palate contact;Holding of bolus;Reduced posterior propulsion;Delayed oral transit;Decreased bolus cohesion Oral - Mech Soft -- Oral - Regular -- Oral - Multi-Consistency -- Oral - Pill -- Oral Phase - Comment --  CHL IP PHARYNGEAL PHASE 10/12/2017 Pharyngeal Phase Impaired Pharyngeal- Pudding Teaspoon -- Pharyngeal -- Pharyngeal- Pudding Cup -- Pharyngeal -- Pharyngeal- Honey Teaspoon -- Pharyngeal -- Pharyngeal- Honey Cup -- Pharyngeal -- Pharyngeal- Nectar Teaspoon Delayed swallow initiation-pyriform sinuses;Reduced pharyngeal peristalsis;Reduced epiglottic inversion;Reduced anterior laryngeal mobility;Reduced laryngeal elevation;Reduced airway/laryngeal closure;Reduced tongue base retraction;Penetration/Aspiration during swallow;Pharyngeal residue - valleculae Pharyngeal  Material enters airway, CONTACTS cords and not ejected out Pharyngeal- Nectar Cup -- Pharyngeal -- Pharyngeal- Nectar Straw -- Pharyngeal -- Pharyngeal- Thin Teaspoon Delayed swallow initiation-pyriform sinuses;Reduced pharyngeal peristalsis;Reduced epiglottic inversion;Reduced anterior laryngeal mobility;Reduced laryngeal elevation;Reduced airway/laryngeal closure;Reduced tongue base retraction;Pharyngeal residue - valleculae;Penetration/Aspiration before swallow Pharyngeal Material enters airway, passes BELOW cords without attempt by patient to eject out (silent aspiration);Material enters airway, passes BELOW cords and not ejected out despite cough attempt by patient Pharyngeal- Thin Cup -- Pharyngeal -- Pharyngeal- Thin Straw -- Pharyngeal -- Pharyngeal- Puree Delayed swallow initiation-pyriform sinuses;Reduced pharyngeal peristalsis;Reduced epiglottic inversion;Reduced anterior laryngeal mobility;Reduced laryngeal elevation;Reduced tongue base retraction;Pharyngeal residue - valleculae Pharyngeal Material does not enter airway Pharyngeal- Mechanical Soft -- Pharyngeal -- Pharyngeal- Regular -- Pharyngeal -- Pharyngeal- Multi-consistency -- Pharyngeal -- Pharyngeal- Pill -- Pharyngeal -- Pharyngeal Comment --  No flowsheet data found. CHL IP GO 09/16/2016 Functional Assessment Tool Used asha noms, clinical judgment,  Functional Limitations Other Speech Language Pathology Swallow Current Status [phone]) (None) Swallow Goal Status (C7893) (None) Swallow Discharge Status [phone]) (None) Motor Speech Current Status [phone]) (None) Motor Speech Goal Status [phone]) (None) Motor Speech Goal Status [phone]) (None) Spoken Language Comprehension Current Status [phone]) (None) Spoken Language Comprehension Goal Status (E3154) (None) Spoken Language Comprehension Discharge Status [phone]) (None) Spoken Language Expression Current Status [phone]) (None) Spoken Language Expression Goal Status [phone]) (None) Spoken Language Expression  Discharge Status [phone]) (None) Attention Current Status (K9983) (None) Attention Goal Status (J8250) (None) Attention Discharge Status [phone]) (None) Memory Current Status (B3419) (None) Memory Goal Status (F7902) (None) Memory Discharge Status (I0973) (None) Voice Current Status (Z3299) (None) Voice Goal Status (M4268) (None) Voice Discharge Status (T4196) (None) Other Speech-Language Pathology Functional Limitation Current Status (Q2297) (None) Other Speech-Language Pathology Functional Limitation Goal Status (L8921) CI Other Speech-Language Pathology Functional Limitation Discharge Status [phone]) CI Juan Quam  Laurice 10/12/2017, 1:49 PM              Ct Head Code Stroke Wo Contrast  Result Date: 10/10/2017 CLINICAL DATA:  Code stroke.  84 year old male with aphasia. EXAM: CT HEAD WITHOUT CONTRAST TECHNIQUE: Contiguous axial images were obtained from the base of the skull through the vertex without intravenous contrast. COMPARISON:  Cervical spine MRI 11/24/2011. FINDINGS: Brain: No acute intracranial hemorrhage identified. No midline shift, mass effect, or evidence of intracranial mass lesion. Patchy bilateral cerebral white matter hypodensity, relatively mild for age. Some involvement of the basal ganglia, more so on the left. No cortically based acute hemispheric infarct identified, but there is patchy age indeterminate hypodensity in the central right cerebellum (series 7, image 7 and coronal image 54). No cortical encephalomalacia identified. Vascular: Calcified atherosclerosis at the skull base. No suspicious intracranial vascular hyperdensity. Skull: No acute osseous abnormality identified. Sinuses/Orbits: Well pneumatized. Other: Postoperative changes to both globes. No acute orbit or scalp soft tissue finding identified. ASPECTS [facility] Stroke Program Early CT Score) - Ganglionic level infarction (caudate, lentiform nuclei, internal capsule, insula, M1-M3 cortex): 7 - Supraganglionic infarction  (M4-M6 cortex): 3 Total score (0-10 with 10 being normal): 10 IMPRESSION: 1. Age indeterminate mild patchy hypodensity in the right central cerebellum. No intracranial hemorrhage or acute cortically based infarct identified. 2. ASPECTS is 10. 3. These results were communicated to Dr. Cheral Marker at 8:39 amon 5/6/2019by text page via the [facility] messaging system. Electronically Signed   By: Genevie Ann M.D.   On: 10/10/2017 08:40    Transthoracic Echocardiogram (10/11/17) Study Conclusions  - Left ventricle: The cavity size was normal. Wall thickness was   increased in a pattern of severe LVH. Systolic function was   mildly reduced. The estimated ejection fraction was in the range   of 45% to 50%. There is akinesis of the basallateral and inferior   myocardium. Features are consistent with a pseudonormal left   ventricular filling pattern, with concomitant abnormal relaxation   and increased filling pressure (grade 2 diastolic dysfunction). - Aortic valve: There was trivial regurgitation. - Mitral valve: Calcified annulus.  Impressions:  - Small area of akinesis in the basal inferior and lateral walls   with overall mildly reduced LV systolic function; severe LVH;   moderate diastolic dysfunction; trace AI.   Subjective: Some restlessness overnight. Improved this morning.  Discharge Exam: Vitals:   10/18/17 2000 10/19/17 0506  BP: (!) 148/77 96/62  Pulse: 70 67  Resp: 18 (!) 22  Temp: 98.1 F (36.7 C) 98.2 F (36.8 C)  SpO2: 99% (!) 84%   Vitals:   10/17/17 2356 10/18/17 1401 10/18/17 2000 10/19/17 0506  BP: (!) 155/74 (!) 144/66 (!) 148/77 96/62  Pulse: 66 64 70 67  Resp: 16 16 18  (!) 22  Temp: 98 F (36.7 C) 97.7 F (36.5 C) 98.1 F (36.7 C) 98.2 F (36.8 C)  TempSrc: Oral Oral Oral Oral  SpO2: 100% 94% 99% (!) 84%  Weight:      Height:        General: Somnolent Cardiovascular: RRR, S1/S2 +, no rubs, no gallops Respiratory: CTA bilaterally, no wheezing, no rhonchi on  anterior auscultation Abdominal: Soft, NT, ND, bowel sounds + Extremities: no edema, no cyanosis    The results of significant diagnostics from this hospitalization (including imaging, microbiology, ancillary and laboratory) are listed below for reference.     Microbiology: No results found for this or any previous visit (from the past 240 hour(s)).  Labs: BNP (last 3 results) No results for input(s): BNP in the last 8760 hours. Basic Metabolic Panel: Recent Labs  Lab 10/13/17 0653 10/15/17 0334  NA 144 145  K 4.0 4.0  CL 113* 113*  CO2 22 24  GLUCOSE 121* 134*  BUN 38* 36*  CREATININE 2.30* 1.98*  CALCIUM 9.1 9.3   Liver Function Tests: No results for input(s): AST, ALT, ALKPHOS, BILITOT, PROT, ALBUMIN in the last 168 hours. No results for input(s): LIPASE, AMYLASE in the last 168 hours. No results for input(s): AMMONIA in the last 168 hours. CBC: Recent Labs  Lab 10/13/17 0653  WBC 8.2  HGB 11.1*  HCT 35.0*  MCV 94.6  PLT 205   Cardiac Enzymes: No results for input(s): CKTOTAL, CKMB, CKMBINDEX, TROPONINI in the last 168 hours. BNP: Invalid input(s): POCBNP CBG: Recent Labs  Lab 10/16/17 0059 10/16/17 0416 10/16/17 0754 10/16/17 1132 10/16/17 2048  GLUCAP 111* 112* 114* 102* 116*   D-Dimer No results for input(s): DDIMER in the last 72 hours. Hgb A1c No results for input(s): HGBA1C in the last 72 hours. Lipid Profile No results for input(s): CHOL, HDL, LDLCALC, TRIG, CHOLHDL, LDLDIRECT in the last 72 hours. Thyroid function studies No results for input(s): TSH, T4TOTAL, T3FREE, THYROIDAB in the last 72 hours.  Invalid input(s): FREET3 Anemia work up No results for input(s): VITAMINB12, FOLATE, FERRITIN, TIBC, IRON, RETICCTPCT in the last 72 hours. Urinalysis    Component Value Date/Time   COLORURINE YELLOW 10/10/2017 0821   APPEARANCEUR HAZY (A) 10/10/2017 0821   LABSPEC 1.023 10/10/2017 0821   PHURINE 5.0 10/10/2017 0821   GLUCOSEU  NEGATIVE 10/10/2017 0821   HGBUR NEGATIVE 10/10/2017 0821   BILIRUBINUR NEGATIVE 10/10/2017 0821   KETONESUR 5 (A) 10/10/2017 0821   PROTEINUR 100 (A) 10/10/2017 0821   UROBILINOGEN 0.2 05/27/2013 1231   NITRITE NEGATIVE 10/10/2017 0821   LEUKOCYTESUR NEGATIVE 10/10/2017 3435     SIGNED:   Cordelia Poche, MD [facility] 10/19/2017, 11:11 AM

## 2017-11-05 DEATH — deceased

## 2017-12-22 ENCOUNTER — Ambulatory Visit: Payer: Medicare Other | Admitting: Neurology

## 2019-10-26 IMAGING — MR MR HEAD W/O CM
10 series · 48 of 48 positions shown · non-contrast
Comparison: 10/11/2017 MRI head.  10/10/2017 CT head and CTA head.

CLINICAL DATA: 89 y/o  M; stroke for follow-up.

EXAM:
MRI HEAD WITHOUT CONTRAST
TECHNIQUE: Multiplanar, multiecho pulse sequences of the brain and surrounding
structures were obtained without intravenous contrast.

[Series 5001: ax dwi_tracew · axial · 3.0mm · 1.50mm/px · z∈[-41,+99]mm · 10 of 80 slices shown]
[im 1/80]
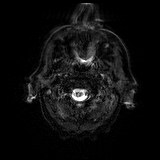
[im 9/80]
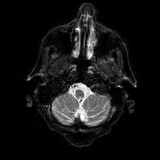
[im 18/80]
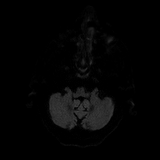
[im 27/80]
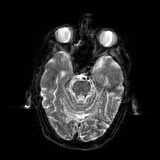
[im 36/80]
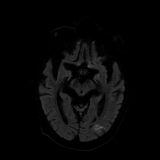
[im 44/80]
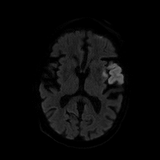
[im 53/80]
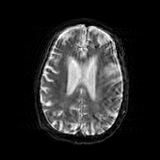
[im 62/80]
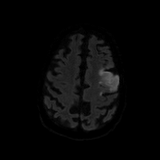
[im 71/80]
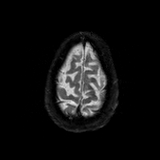
[im 80/80]
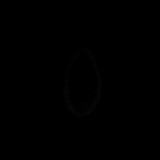

[Series 6001: ax dwi_adc · axial · 3.0mm · 1.50mm/px · z∈[-41,+99]mm · 5 of 40 slices shown]
[im 1/40]
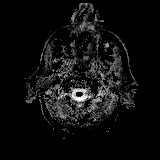
[im 10/40]
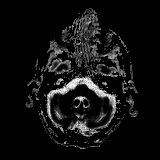
[im 20/40]
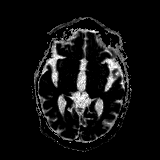
[im 30/40]
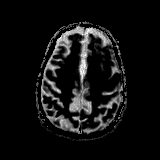
[im 40/40]
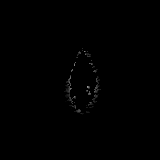

[Series 7001: DWI · coronal · 4.0mm · 0.88mm/px · 9 of 72 slices shown (1 of 2)]
[im 1/72]
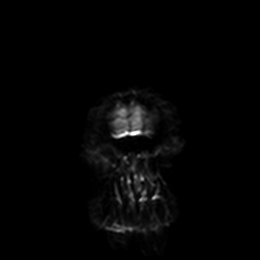
[im 9/72]
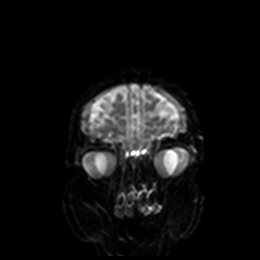
[im 18/72]
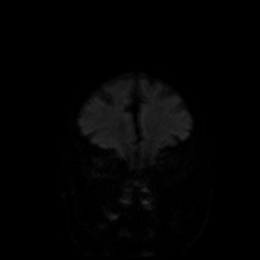
[im 27/72]
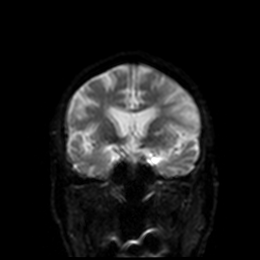
[im 36/72]
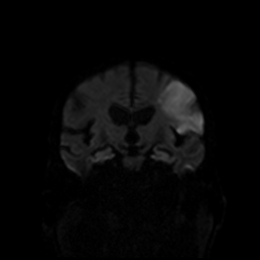
[im 45/72]
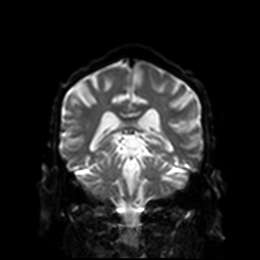
[im 54/72]
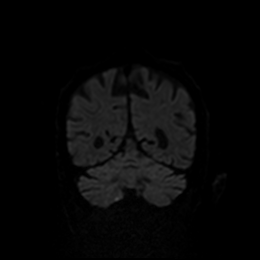
[im 63/72]
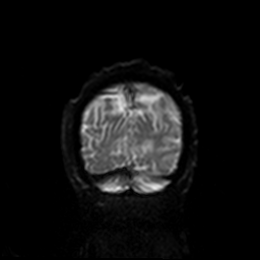
[im 72/72]
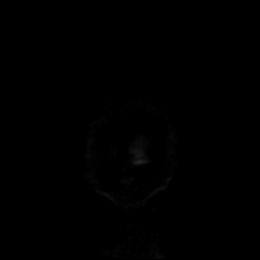

[Series 8001: DWI · coronal · 4.0mm · 0.88mm/px · 5 of 36 slices shown (2 of 2)]
[im 1/36]
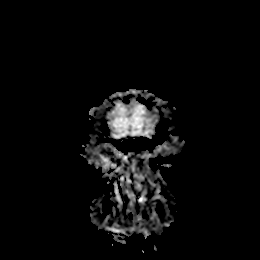
[im 9/36]
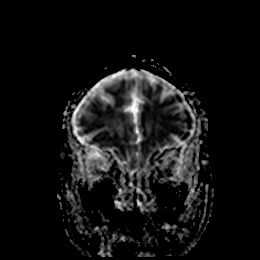
[im 18/36]
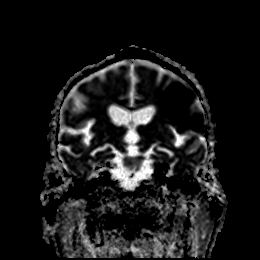
[im 27/36]
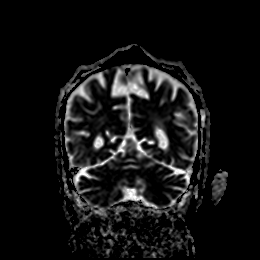
[im 36/36]
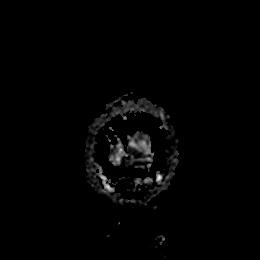

[Series 9001: T1 · sagittal · 5.0mm · 0.75mm/px · 3 of 23 slices shown]
[im 1/23]
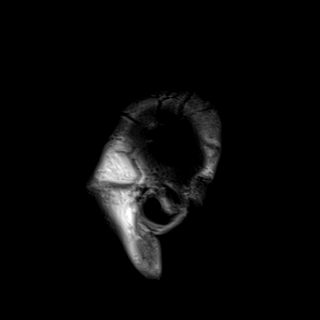
[im 12/23]
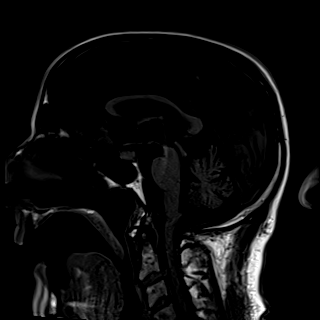
[im 23/23]
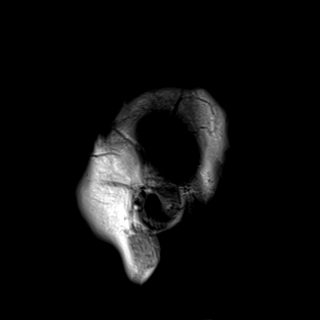

[T2 · axial · 5.0mm · 0.69mm/px · z∈[-44,+99]mm · 3 of 25 slices shown]
[im 1/25]
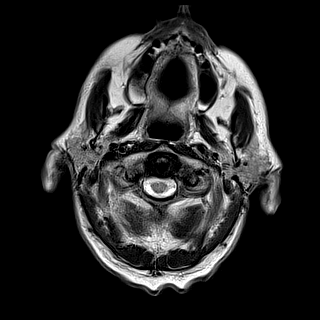
[im 13/25]
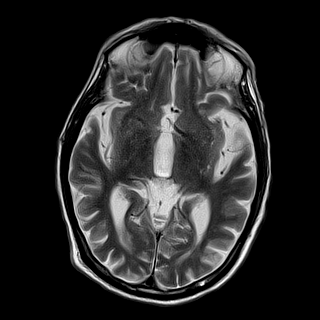
[im 25/25]
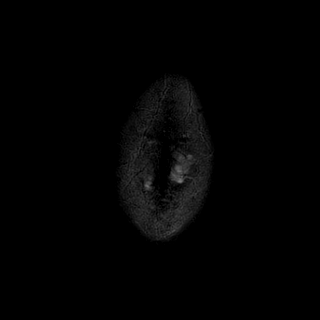

[t2_blade_dark-fluid_tra_p2 · axial · 5.0mm · 0.90mm/px · z∈[-45,+99]mm · 3 of 25 slices shown]
[im 1/25]
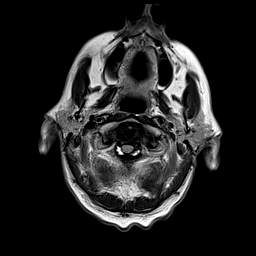
[im 13/25]
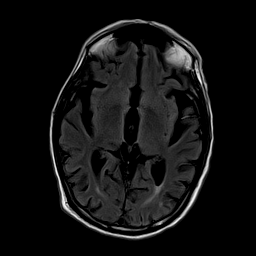
[im 25/25]
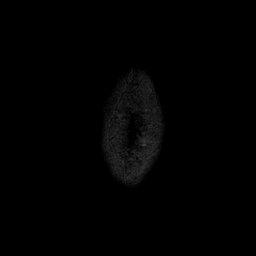

[t2_fl2d_tra_4mm_hemo · axial · 5.0mm · 0.45mm/px · z∈[-45,+99]mm · 3 of 25 slices shown]
[im 1/25]
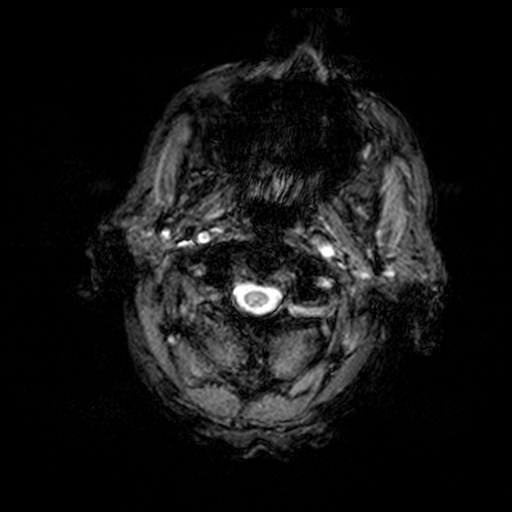
[im 13/25]
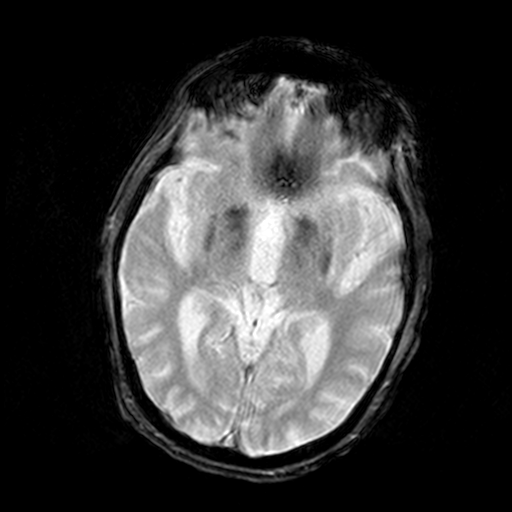
[im 25/25]
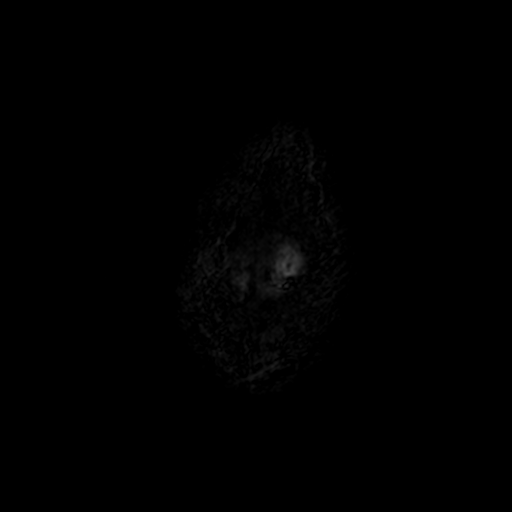

[t1_blade_dark-fluid_tra · axial · 5.0mm · 0.90mm/px · z∈[-45,+99]mm · 3 of 25 slices shown]
[im 1/25]
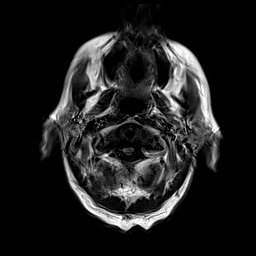
[im 13/25]
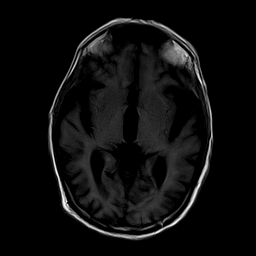
[im 25/25]
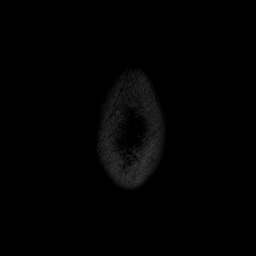

[t2_blade_cor_p2 · coronal · 5.0mm · 0.72mm/px · 4 of 31 slices shown]
[im 1/31]
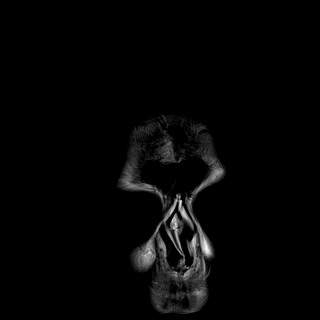
[im 11/31]
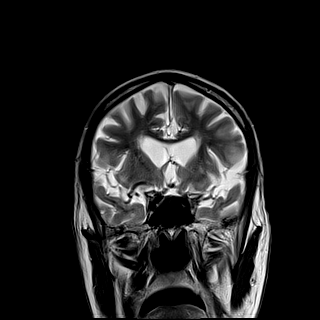
[im 21/31]
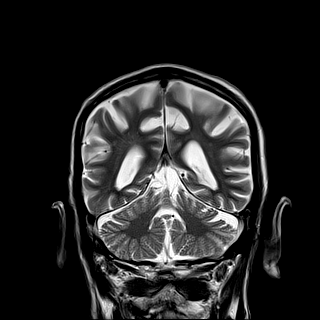
[im 31/31]
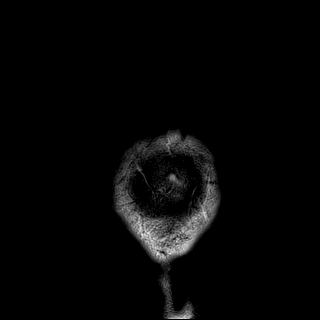

[48 of 48 positions shown; findings below may reference images not displayed]

FINDINGS: Brain: Area of reduced diffusion within the left lateral frontal
lobe extending into the mid insula compatible with acute/early
subacute infarction. Additional small focus of acute/early subacute
infarction within the left occipital temporal junction. No
associated hemorrhage or mass effect.

Small chronic infarcts are present within bilateral cerebellar
hemispheres, left thalamus, and left anterior putamen. Moderate
chronic microvascular ischemic changes and parenchymal volume loss
of the brain for age. Punctate foci of susceptibility hypointensity
are present in right cerebellar hemisphere and left frontal
periventricular white matter compatible with hemosiderin deposition
of chronic microhemorrhage. No hydrocephalus, extra-axial
collection, herniation, or effacement of basilar cisterns.

Vascular: Normal flow voids.

Skull and upper cervical spine: Normal marrow signal.

Sinuses/Orbits: Negative.  Bilateral intra-ocular lens replacement.

Other: None.
IMPRESSION: Acute/early subacute infarction involving the left lateral frontal
lobe and mid insula. Additional small focus of acute/early subacute
infarction within the left occipitotemporal junction. No associated
hemorrhage or mass effect.

By: Bratnia Plawgo M.D.

## 2019-10-26 IMAGING — DX DG CHEST 1V PORT
1 series · 1 of 1 positions shown · non-contrast
Comparison: 07/23/2015 chest x-ray.

CLINICAL DATA: 89-year-old male with fever.  Initial encounter.

EXAM:
PORTABLE CHEST 1 VIEW

[chest ap]
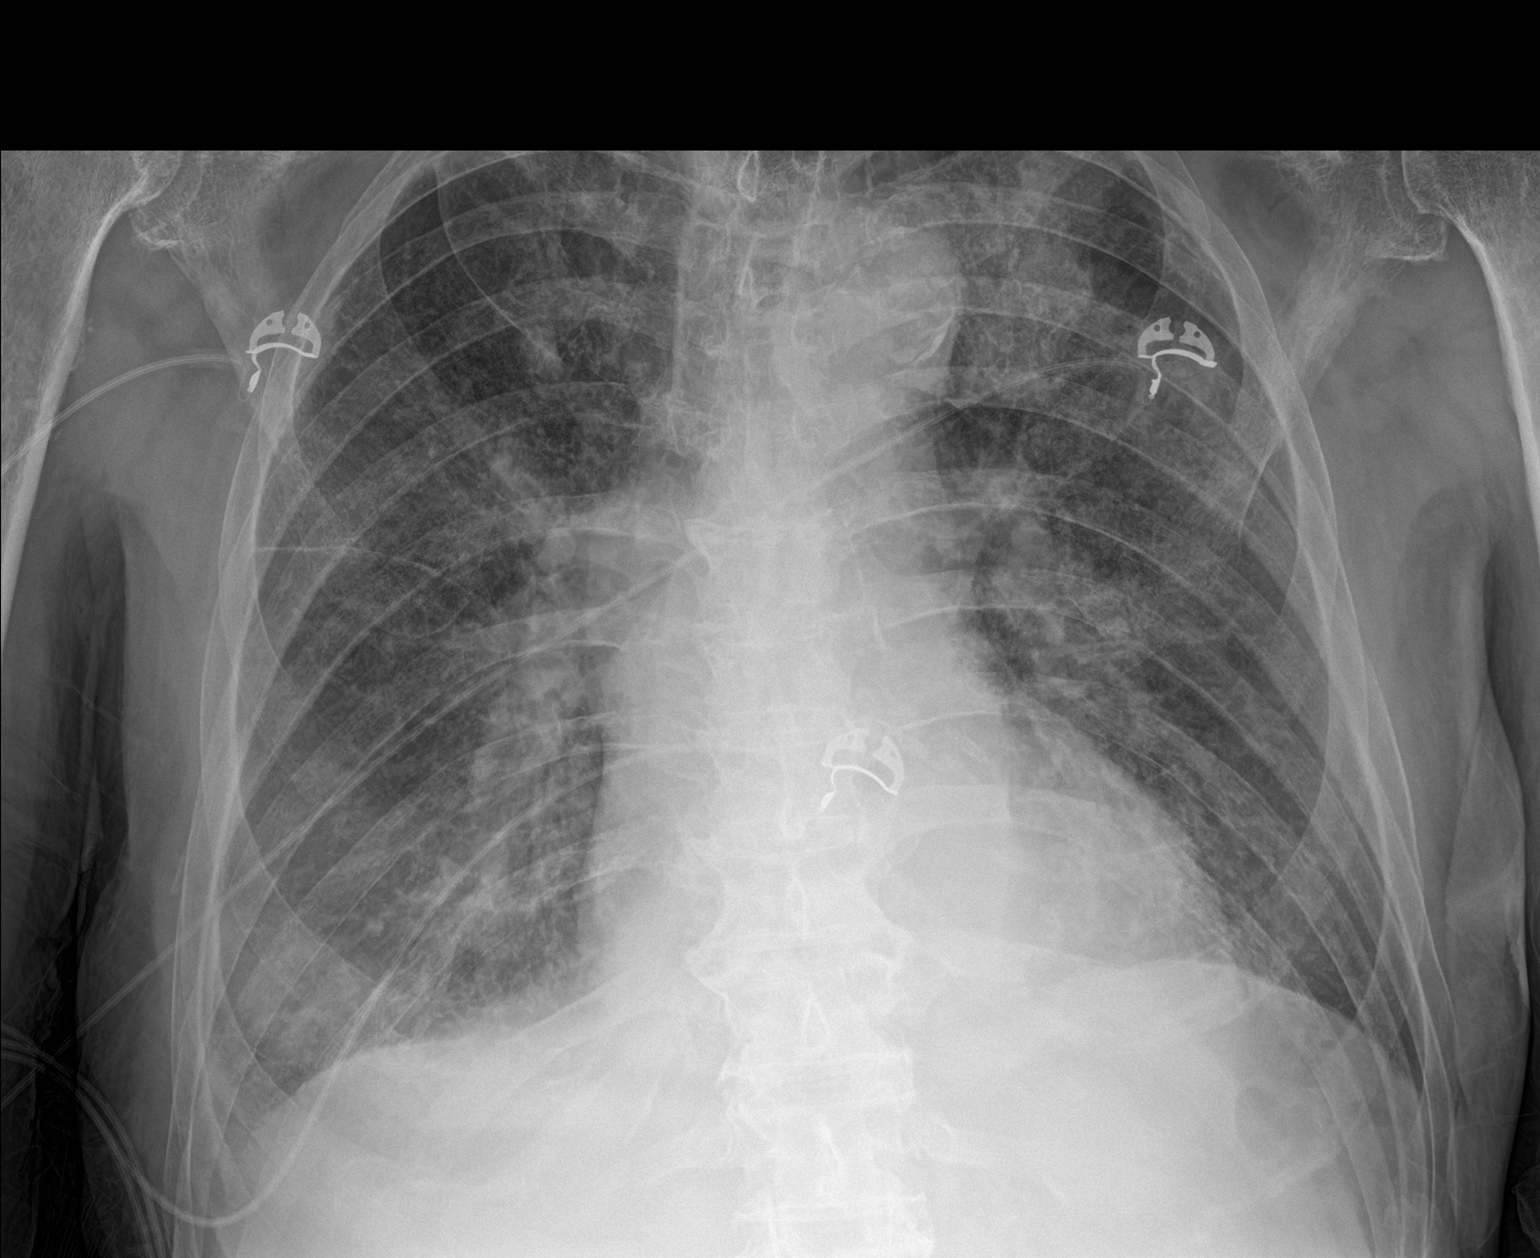

[1 of 1 positions shown; findings below may reference images not displayed]

FINDINGS: Pulmonary vascular congestion/pulmonary edema. Superimposed patchy
consolidation left upper lobe and possibly right lung base may
represent infectious process given patient's history of fever.

Heart size top-normal.

Calcified aorta.
IMPRESSION: Pulmonary vascular congestion/pulmonary edema.

Superimposed patchy consolidation left upper lobe and possibly right
lung base may represent infectious process given patient's history
of fever. Follow-up until clearance recommended to exclude
underlying mass.

Aortic Atherosclerosis ([id number]).

## 2019-10-27 IMAGING — RF DG SWALLOWING FUNCTION - NRPT [facility]
1 series · 18 of 24 positions shown · non-contrast
Comparison: none

[Series 1: run · 13 acquisitions, 18 frames shown]
[im 1/13]
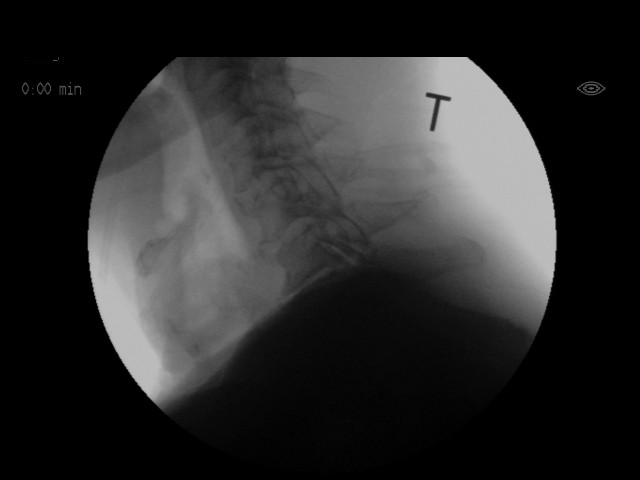
[im 2/13]
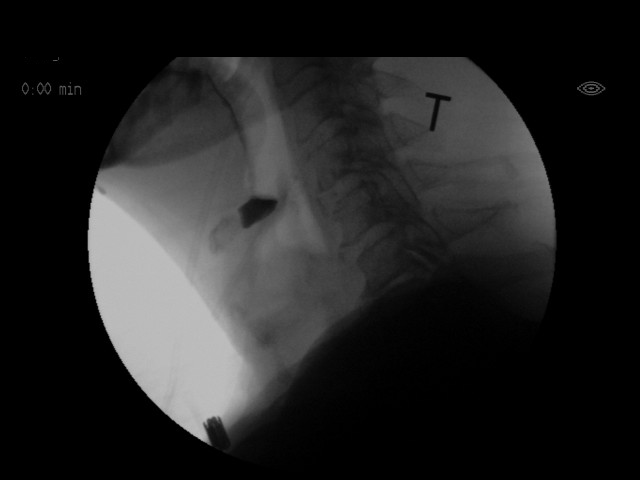
[im 2/13]
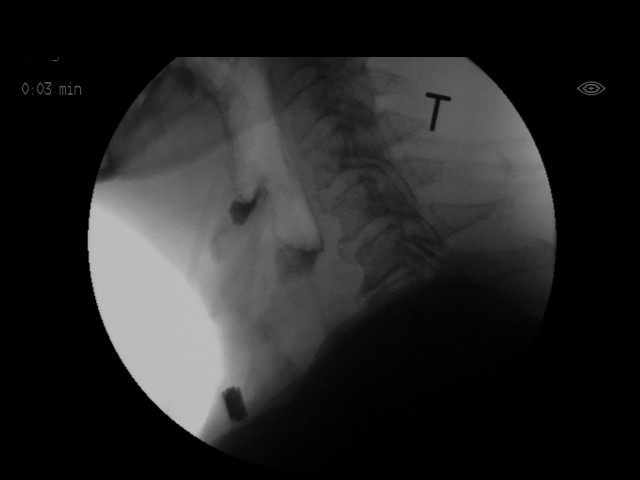
[im 3/13]
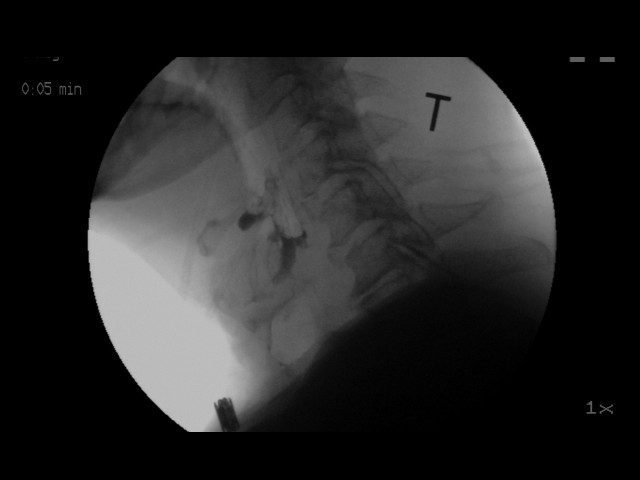
[im 4/13]
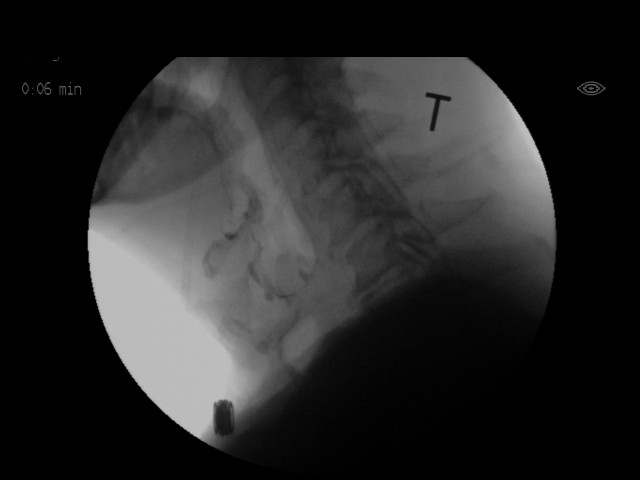
[im 5/13]
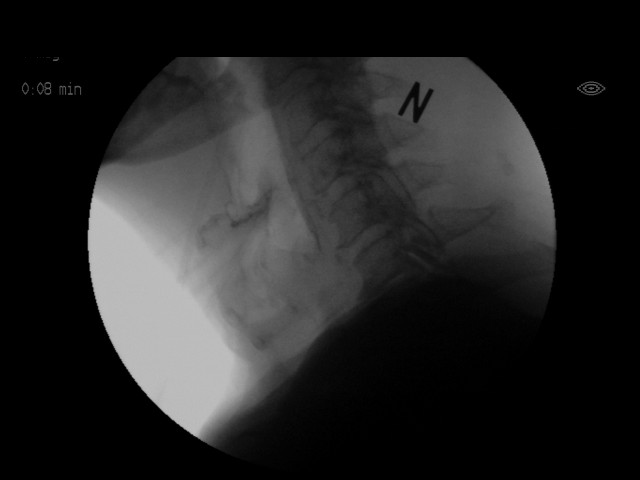
[im 5/13]
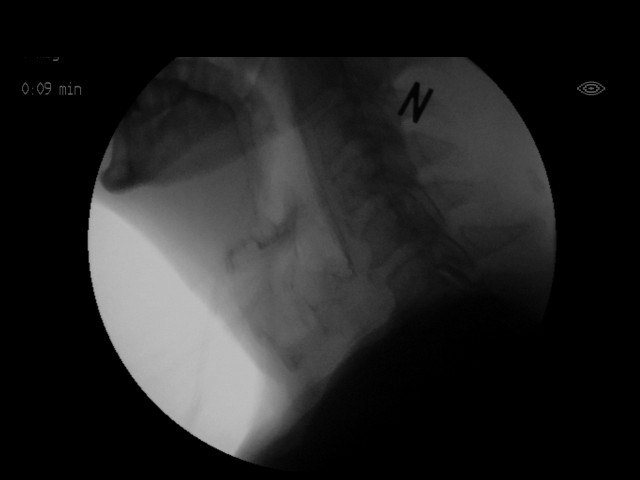
[im 6/13]
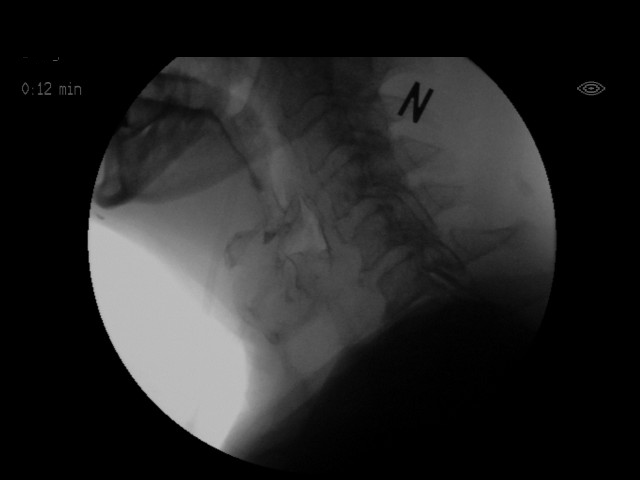
[im 7/13]
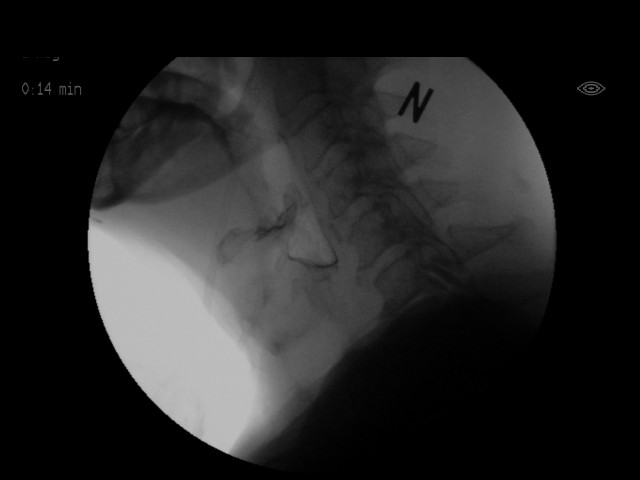
[im 7/13]
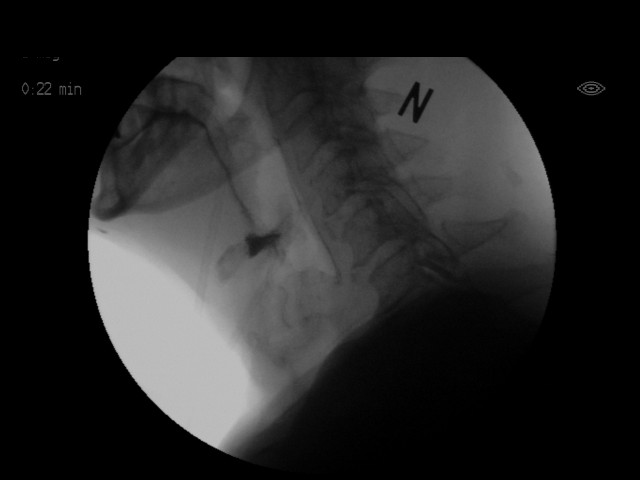
[im 8/13]
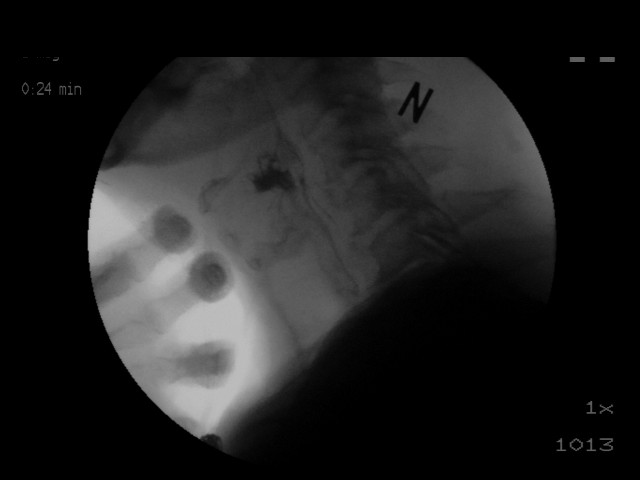
[im 9/13]
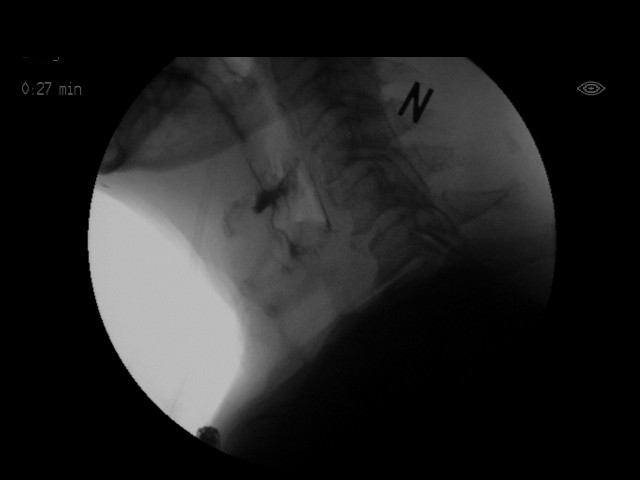
[im 9/13]
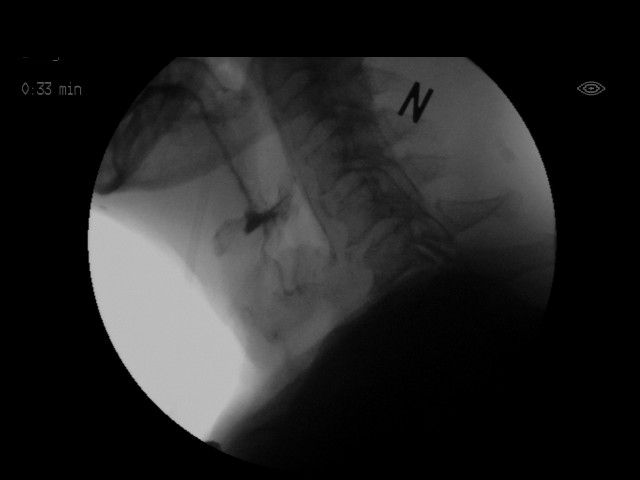
[im 11/13]
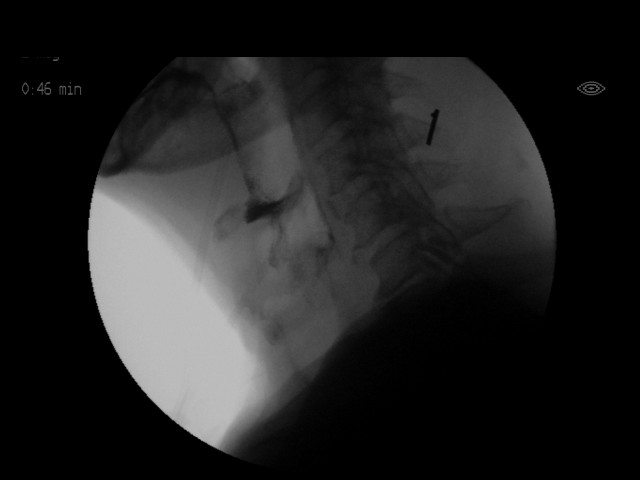
[im 11/13]
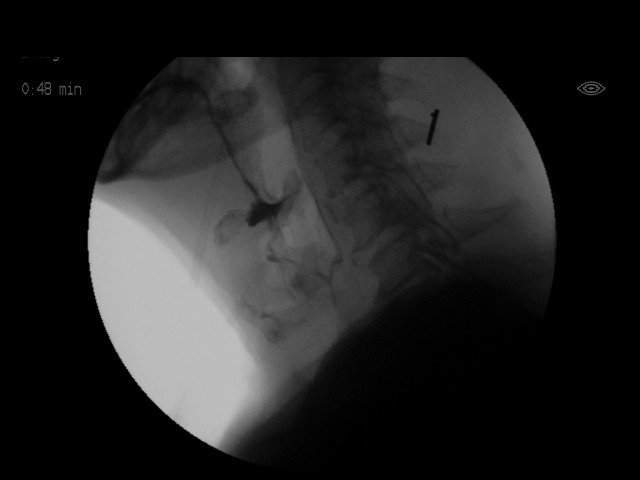
[im 12/13]
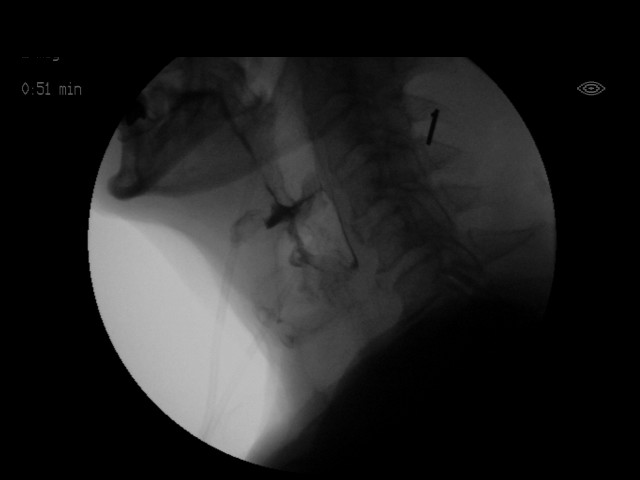
[im 13/13]
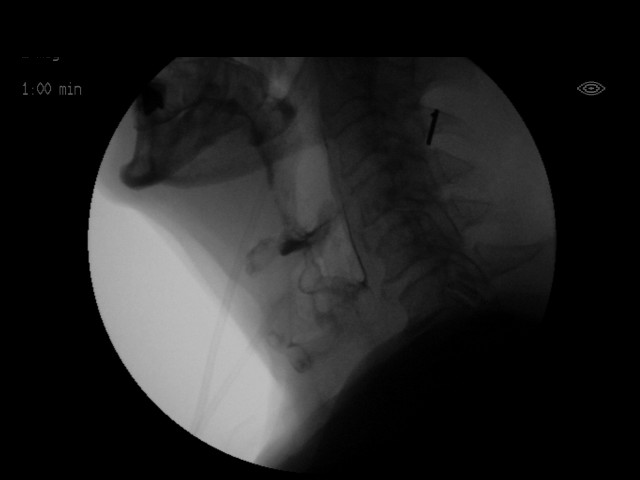
[im 13/13]
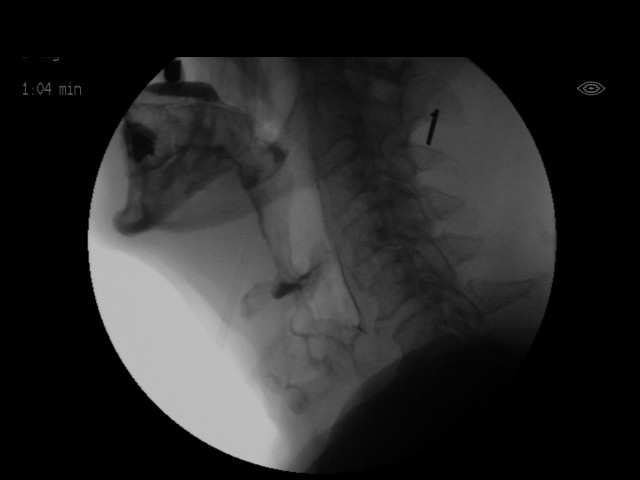

[18 of 24 positions shown; findings below may reference images not displayed]

FLUOROSCOPY FOR SWALLOWING FUNCTION STUDY:
Fluoroscopy was provided for swallowing function study, which was administered by a speech pathologist.  Final results and recommendations from this study are contained within the speech pathology report.
